# Patient Record
Sex: Female | Born: 1945 | Race: Black or African American | Hispanic: No | Marital: Single | Smoking: Former smoker
Health system: Southern US, Community
[De-identification: ages and names within clinical notes are randomized; demographics above are authoritative.]

## PROBLEM LIST (undated history)

## (undated) DIAGNOSIS — T8859XA Other complications of anesthesia, initial encounter: Secondary | ICD-10-CM

## (undated) DIAGNOSIS — N186 End stage renal disease: Secondary | ICD-10-CM

## (undated) DIAGNOSIS — D259 Leiomyoma of uterus, unspecified: Secondary | ICD-10-CM

## (undated) DIAGNOSIS — N2581 Secondary hyperparathyroidism of renal origin: Secondary | ICD-10-CM

## (undated) DIAGNOSIS — Z992 Dependence on renal dialysis: Secondary | ICD-10-CM

## (undated) DIAGNOSIS — D649 Anemia, unspecified: Secondary | ICD-10-CM

## (undated) DIAGNOSIS — Z5189 Encounter for other specified aftercare: Secondary | ICD-10-CM

## (undated) DIAGNOSIS — G8929 Other chronic pain: Secondary | ICD-10-CM

## (undated) DIAGNOSIS — T4145XA Adverse effect of unspecified anesthetic, initial encounter: Secondary | ICD-10-CM

## (undated) DIAGNOSIS — H269 Unspecified cataract: Secondary | ICD-10-CM

## (undated) DIAGNOSIS — M549 Dorsalgia, unspecified: Secondary | ICD-10-CM

## (undated) DIAGNOSIS — I10 Essential (primary) hypertension: Secondary | ICD-10-CM

## (undated) DIAGNOSIS — Z9989 Dependence on other enabling machines and devices: Secondary | ICD-10-CM

## (undated) DIAGNOSIS — M1990 Unspecified osteoarthritis, unspecified site: Secondary | ICD-10-CM

## (undated) HISTORY — DX: Leiomyoma of uterus, unspecified: D25.9

## (undated) HISTORY — DX: Anemia, unspecified: D64.9

## (undated) HISTORY — PX: HERNIA REPAIR: SHX51

## (undated) HISTORY — DX: Essential (primary) hypertension: I10

## (undated) HISTORY — DX: Other chronic pain: G89.29

## (undated) HISTORY — PX: COLONOSCOPY W/ BIOPSIES: SHX1374

## (undated) HISTORY — DX: Secondary hyperparathyroidism of renal origin: N25.81

## (undated) HISTORY — DX: Dorsalgia, unspecified: M54.9

## (undated) HISTORY — PX: OTHER SURGICAL HISTORY: SHX169

## (undated) HISTORY — DX: Encounter for other specified aftercare: Z51.89

## (undated) HISTORY — DX: Unspecified cataract: H26.9

---

## 1996-02-20 HISTORY — PX: HEMICOLECTOMY: SHX854

## 1997-12-20 ENCOUNTER — Encounter (HOSPITAL_COMMUNITY): Admission: RE | Admit: 1997-12-20 | Discharge: 1998-03-20 | Payer: Self-pay | Admitting: Nephrology

## 1998-03-26 ENCOUNTER — Encounter (HOSPITAL_COMMUNITY): Admission: RE | Admit: 1998-03-26 | Discharge: 1998-06-24 | Payer: Self-pay | Admitting: Nephrology

## 1998-07-05 ENCOUNTER — Encounter: Admission: RE | Admit: 1998-07-05 | Discharge: 1998-10-03 | Payer: Self-pay | Admitting: Nephrology

## 1998-10-04 ENCOUNTER — Encounter (HOSPITAL_COMMUNITY): Admission: RE | Admit: 1998-10-04 | Discharge: 1999-01-02 | Payer: Self-pay | Admitting: Nephrology

## 1998-12-26 ENCOUNTER — Encounter: Admission: RE | Admit: 1998-12-26 | Discharge: 1999-03-26 | Payer: Self-pay | Admitting: Nephrology

## 1999-01-06 ENCOUNTER — Ambulatory Visit (HOSPITAL_COMMUNITY): Admission: RE | Admit: 1999-01-06 | Discharge: 1999-01-06 | Payer: Self-pay | Admitting: Vascular Surgery

## 1999-01-06 ENCOUNTER — Encounter: Payer: Self-pay | Admitting: Vascular Surgery

## 1999-01-16 ENCOUNTER — Encounter (HOSPITAL_COMMUNITY): Admission: RE | Admit: 1999-01-16 | Discharge: 1999-04-16 | Payer: Self-pay | Admitting: Nephrology

## 1999-05-06 DIAGNOSIS — Q612 Polycystic kidney, adult type: Secondary | ICD-10-CM | POA: Insufficient documentation

## 1999-08-08 ENCOUNTER — Encounter: Admission: RE | Admit: 1999-08-08 | Discharge: 1999-08-08 | Payer: Self-pay | Admitting: Nephrology

## 1999-08-08 ENCOUNTER — Encounter: Payer: Self-pay | Admitting: Nephrology

## 1999-08-26 ENCOUNTER — Ambulatory Visit (HOSPITAL_COMMUNITY): Admission: RE | Admit: 1999-08-26 | Discharge: 1999-08-26 | Payer: Self-pay | Admitting: Nephrology

## 1999-08-27 ENCOUNTER — Encounter: Payer: Self-pay | Admitting: Vascular Surgery

## 1999-08-27 ENCOUNTER — Ambulatory Visit (HOSPITAL_COMMUNITY): Admission: RE | Admit: 1999-08-27 | Discharge: 1999-08-27 | Payer: Self-pay | Admitting: Vascular Surgery

## 1999-08-28 ENCOUNTER — Emergency Department (HOSPITAL_COMMUNITY): Admission: EM | Admit: 1999-08-28 | Discharge: 1999-08-28 | Payer: Self-pay | Admitting: Emergency Medicine

## 1999-08-31 ENCOUNTER — Emergency Department (HOSPITAL_COMMUNITY): Admission: EM | Admit: 1999-08-31 | Discharge: 1999-08-31 | Payer: Self-pay | Admitting: Emergency Medicine

## 1999-09-10 ENCOUNTER — Ambulatory Visit (HOSPITAL_COMMUNITY): Admission: RE | Admit: 1999-09-10 | Discharge: 1999-09-10 | Payer: Self-pay | Admitting: Nephrology

## 1999-12-09 ENCOUNTER — Emergency Department (HOSPITAL_COMMUNITY): Admission: EM | Admit: 1999-12-09 | Discharge: 1999-12-09 | Payer: Self-pay | Admitting: Emergency Medicine

## 1999-12-22 ENCOUNTER — Encounter: Admission: RE | Admit: 1999-12-22 | Discharge: 1999-12-22 | Payer: Self-pay | Admitting: *Deleted

## 2000-01-21 ENCOUNTER — Encounter: Payer: Self-pay | Admitting: Nephrology

## 2000-01-21 ENCOUNTER — Inpatient Hospital Stay (HOSPITAL_COMMUNITY): Admission: AD | Admit: 2000-01-21 | Discharge: 2000-01-24 | Payer: Self-pay | Admitting: Nephrology

## 2000-01-23 ENCOUNTER — Encounter: Payer: Self-pay | Admitting: Nephrology

## 2000-03-19 ENCOUNTER — Ambulatory Visit (HOSPITAL_COMMUNITY): Admission: RE | Admit: 2000-03-19 | Discharge: 2000-03-19 | Payer: Self-pay | Admitting: Vascular Surgery

## 2000-03-19 HISTORY — PX: ARTERIOVENOUS GRAFT PLACEMENT: SUR1029

## 2000-05-03 ENCOUNTER — Ambulatory Visit (HOSPITAL_COMMUNITY): Admission: RE | Admit: 2000-05-03 | Discharge: 2000-05-03 | Payer: Self-pay | Admitting: Gastroenterology

## 2000-05-31 ENCOUNTER — Ambulatory Visit (HOSPITAL_COMMUNITY): Admission: RE | Admit: 2000-05-31 | Discharge: 2000-05-31 | Payer: Self-pay | Admitting: Vascular Surgery

## 2000-05-31 HISTORY — PX: REMOVAL OF A DIALYSIS CATHETER: SHX6053

## 2000-06-04 ENCOUNTER — Ambulatory Visit (HOSPITAL_COMMUNITY): Admission: RE | Admit: 2000-06-04 | Discharge: 2000-06-04 | Payer: Self-pay | Admitting: *Deleted

## 2000-06-04 ENCOUNTER — Encounter: Payer: Self-pay | Admitting: *Deleted

## 2000-06-04 HISTORY — PX: THROMBECTOMY AND REVISION OF ARTERIOVENTOUS (AV) GORETEX  GRAFT: SHX6120

## 2000-06-06 ENCOUNTER — Encounter: Payer: Self-pay | Admitting: *Deleted

## 2000-06-06 ENCOUNTER — Ambulatory Visit (HOSPITAL_COMMUNITY): Admission: RE | Admit: 2000-06-06 | Discharge: 2000-06-06 | Payer: Self-pay | Admitting: *Deleted

## 2000-06-06 HISTORY — PX: INSERTION OF DIALYSIS CATHETER: SHX1324

## 2000-06-09 ENCOUNTER — Ambulatory Visit (HOSPITAL_COMMUNITY): Admission: RE | Admit: 2000-06-09 | Discharge: 2000-06-09 | Payer: Self-pay | Admitting: Nephrology

## 2000-06-09 ENCOUNTER — Encounter: Payer: Self-pay | Admitting: Nephrology

## 2000-06-14 ENCOUNTER — Ambulatory Visit (HOSPITAL_COMMUNITY): Admission: RE | Admit: 2000-06-14 | Discharge: 2000-06-14 | Payer: Self-pay | Admitting: Nephrology

## 2000-06-25 ENCOUNTER — Ambulatory Visit (HOSPITAL_COMMUNITY): Admission: RE | Admit: 2000-06-25 | Discharge: 2000-06-25 | Payer: Self-pay | Admitting: Nephrology

## 2000-06-28 ENCOUNTER — Ambulatory Visit (HOSPITAL_COMMUNITY): Admission: RE | Admit: 2000-06-28 | Discharge: 2000-06-28 | Payer: Self-pay | Admitting: Vascular Surgery

## 2000-06-28 ENCOUNTER — Encounter: Payer: Self-pay | Admitting: Vascular Surgery

## 2000-06-28 HISTORY — PX: INSERTION OF DIALYSIS CATHETER: SHX1324

## 2000-08-13 ENCOUNTER — Encounter: Payer: Self-pay | Admitting: Vascular Surgery

## 2000-08-13 ENCOUNTER — Ambulatory Visit (HOSPITAL_COMMUNITY): Admission: RE | Admit: 2000-08-13 | Discharge: 2000-08-13 | Payer: Self-pay | Admitting: Vascular Surgery

## 2000-08-13 HISTORY — PX: INSERTION OF DIALYSIS CATHETER: SHX1324

## 2000-08-23 ENCOUNTER — Emergency Department (HOSPITAL_COMMUNITY): Admission: EM | Admit: 2000-08-23 | Discharge: 2000-08-23 | Payer: Self-pay | Admitting: Emergency Medicine

## 2000-09-01 ENCOUNTER — Ambulatory Visit (HOSPITAL_COMMUNITY): Admission: RE | Admit: 2000-09-01 | Discharge: 2000-09-01 | Payer: Self-pay | Admitting: Nephrology

## 2000-09-08 ENCOUNTER — Encounter: Payer: Self-pay | Admitting: Nephrology

## 2000-09-08 ENCOUNTER — Ambulatory Visit (HOSPITAL_COMMUNITY): Admission: RE | Admit: 2000-09-08 | Discharge: 2000-09-08 | Payer: Self-pay | Admitting: Nephrology

## 2000-09-22 ENCOUNTER — Ambulatory Visit (HOSPITAL_COMMUNITY): Admission: RE | Admit: 2000-09-22 | Discharge: 2000-09-22 | Payer: Self-pay | Admitting: Vascular Surgery

## 2000-09-22 ENCOUNTER — Encounter: Payer: Self-pay | Admitting: Vascular Surgery

## 2000-11-03 ENCOUNTER — Ambulatory Visit (HOSPITAL_COMMUNITY): Admission: RE | Admit: 2000-11-03 | Discharge: 2000-11-03 | Payer: Self-pay | Admitting: Nephrology

## 2000-12-01 ENCOUNTER — Encounter: Payer: Self-pay | Admitting: Vascular Surgery

## 2000-12-03 ENCOUNTER — Inpatient Hospital Stay (HOSPITAL_COMMUNITY): Admission: RE | Admit: 2000-12-03 | Discharge: 2000-12-08 | Payer: Self-pay | Admitting: Vascular Surgery

## 2000-12-03 HISTORY — PX: ARTERIOVENOUS GRAFT PLACEMENT: SUR1029

## 2000-12-13 ENCOUNTER — Ambulatory Visit (HOSPITAL_COMMUNITY): Admission: RE | Admit: 2000-12-13 | Discharge: 2000-12-13 | Payer: Self-pay | Admitting: *Deleted

## 2000-12-23 ENCOUNTER — Ambulatory Visit (HOSPITAL_COMMUNITY): Admission: RE | Admit: 2000-12-23 | Discharge: 2000-12-23 | Payer: Self-pay | Admitting: Nephrology

## 2001-02-12 ENCOUNTER — Emergency Department (HOSPITAL_COMMUNITY): Admission: EM | Admit: 2001-02-12 | Discharge: 2001-02-12 | Payer: Self-pay

## 2001-03-22 ENCOUNTER — Emergency Department (HOSPITAL_COMMUNITY): Admission: EM | Admit: 2001-03-22 | Discharge: 2001-03-22 | Payer: Self-pay

## 2002-02-08 ENCOUNTER — Ambulatory Visit (HOSPITAL_COMMUNITY): Admission: RE | Admit: 2002-02-08 | Discharge: 2002-02-08 | Payer: Self-pay | Admitting: Nephrology

## 2002-02-08 ENCOUNTER — Encounter: Payer: Self-pay | Admitting: Nephrology

## 2003-05-31 ENCOUNTER — Encounter: Payer: Self-pay | Admitting: Nephrology

## 2003-05-31 ENCOUNTER — Ambulatory Visit (HOSPITAL_COMMUNITY): Admission: RE | Admit: 2003-05-31 | Discharge: 2003-05-31 | Payer: Self-pay | Admitting: Nephrology

## 2003-08-08 ENCOUNTER — Encounter: Admission: RE | Admit: 2003-08-08 | Discharge: 2003-08-08 | Payer: Self-pay | Admitting: Nephrology

## 2003-08-14 ENCOUNTER — Other Ambulatory Visit: Admission: RE | Admit: 2003-08-14 | Discharge: 2003-08-14 | Payer: Self-pay | Admitting: Obstetrics and Gynecology

## 2003-12-17 ENCOUNTER — Encounter: Admission: RE | Admit: 2003-12-17 | Discharge: 2003-12-17 | Payer: Self-pay | Admitting: Nephrology

## 2003-12-21 ENCOUNTER — Encounter: Admission: RE | Admit: 2003-12-21 | Discharge: 2003-12-21 | Payer: Self-pay | Admitting: Nephrology

## 2004-01-24 ENCOUNTER — Ambulatory Visit (HOSPITAL_COMMUNITY): Admission: RE | Admit: 2004-01-24 | Discharge: 2004-01-24 | Payer: Self-pay | Admitting: Nephrology

## 2004-03-20 ENCOUNTER — Emergency Department (HOSPITAL_COMMUNITY): Admission: EM | Admit: 2004-03-20 | Discharge: 2004-03-20 | Payer: Self-pay | Admitting: Emergency Medicine

## 2004-03-22 ENCOUNTER — Emergency Department (HOSPITAL_COMMUNITY): Admission: EM | Admit: 2004-03-22 | Discharge: 2004-03-22 | Payer: Self-pay | Admitting: Emergency Medicine

## 2004-04-15 ENCOUNTER — Encounter: Admission: RE | Admit: 2004-04-15 | Discharge: 2004-04-15 | Payer: Self-pay | Admitting: Nephrology

## 2004-05-05 ENCOUNTER — Ambulatory Visit (HOSPITAL_COMMUNITY): Admission: RE | Admit: 2004-05-05 | Discharge: 2004-05-05 | Payer: Self-pay | Admitting: Gastroenterology

## 2004-05-13 ENCOUNTER — Ambulatory Visit (HOSPITAL_COMMUNITY): Admission: RE | Admit: 2004-05-13 | Discharge: 2004-05-13 | Payer: Self-pay | Admitting: Nephrology

## 2004-08-29 ENCOUNTER — Ambulatory Visit (HOSPITAL_COMMUNITY): Admission: RE | Admit: 2004-08-29 | Discharge: 2004-08-29 | Payer: Self-pay | Admitting: Nephrology

## 2004-09-06 ENCOUNTER — Ambulatory Visit (HOSPITAL_COMMUNITY): Admission: RE | Admit: 2004-09-06 | Discharge: 2004-09-06 | Payer: Self-pay | Admitting: *Deleted

## 2004-09-06 HISTORY — PX: THROMBECTOMY AND REVISION OF ARTERIOVENTOUS (AV) GORETEX  GRAFT: SHX6120

## 2004-10-15 ENCOUNTER — Ambulatory Visit: Payer: Self-pay | Admitting: Cardiology

## 2004-10-27 ENCOUNTER — Ambulatory Visit: Payer: Self-pay

## 2004-11-03 ENCOUNTER — Ambulatory Visit: Payer: Self-pay | Admitting: Cardiology

## 2006-03-26 ENCOUNTER — Ambulatory Visit (HOSPITAL_COMMUNITY): Admission: RE | Admit: 2006-03-26 | Discharge: 2006-03-30 | Payer: Self-pay

## 2006-04-15 HISTORY — PX: INCISIONAL HERNIA REPAIR: SHX193

## 2006-12-13 ENCOUNTER — Encounter: Admission: RE | Admit: 2006-12-13 | Discharge: 2006-12-13 | Payer: Self-pay | Admitting: Nephrology

## 2007-02-01 ENCOUNTER — Emergency Department (HOSPITAL_COMMUNITY): Admission: EM | Admit: 2007-02-01 | Discharge: 2007-02-01 | Payer: Self-pay | Admitting: Emergency Medicine

## 2010-01-20 DIAGNOSIS — D259 Leiomyoma of uterus, unspecified: Secondary | ICD-10-CM | POA: Insufficient documentation

## 2010-01-20 DIAGNOSIS — M47817 Spondylosis without myelopathy or radiculopathy, lumbosacral region: Secondary | ICD-10-CM | POA: Insufficient documentation

## 2010-05-16 ENCOUNTER — Encounter: Admission: RE | Admit: 2010-05-16 | Discharge: 2010-05-16 | Payer: Self-pay | Admitting: Family Medicine

## 2010-06-13 ENCOUNTER — Encounter: Admission: RE | Admit: 2010-06-13 | Discharge: 2010-06-13 | Payer: Self-pay | Admitting: Nephrology

## 2010-10-11 ENCOUNTER — Encounter: Payer: Self-pay | Admitting: Nephrology

## 2010-10-12 ENCOUNTER — Encounter: Payer: Self-pay | Admitting: Family Medicine

## 2011-01-08 ENCOUNTER — Ambulatory Visit (HOSPITAL_COMMUNITY)
Admission: RE | Admit: 2011-01-08 | Discharge: 2011-01-08 | Disposition: A | Discharge status: Home / Self Care | Payer: Medicare Other | Source: Ambulatory Visit | Attending: Nephrology | Admitting: Nephrology

## 2011-01-08 DIAGNOSIS — I080 Rheumatic disorders of both mitral and aortic valves: Secondary | ICD-10-CM | POA: Insufficient documentation

## 2011-01-08 DIAGNOSIS — I079 Rheumatic tricuspid valve disease, unspecified: Secondary | ICD-10-CM | POA: Insufficient documentation

## 2011-01-08 DIAGNOSIS — I319 Disease of pericardium, unspecified: Secondary | ICD-10-CM | POA: Insufficient documentation

## 2011-01-08 DIAGNOSIS — I9589 Other hypotension: Secondary | ICD-10-CM | POA: Insufficient documentation

## 2011-01-08 DIAGNOSIS — N186 End stage renal disease: Secondary | ICD-10-CM | POA: Insufficient documentation

## 2011-01-08 DIAGNOSIS — Z992 Dependence on renal dialysis: Secondary | ICD-10-CM | POA: Insufficient documentation

## 2011-01-29 ENCOUNTER — Encounter (INDEPENDENT_AMBULATORY_CARE_PROVIDER_SITE_OTHER): Payer: Medicare Other

## 2011-01-29 DIAGNOSIS — N186 End stage renal disease: Secondary | ICD-10-CM

## 2011-01-29 DIAGNOSIS — Z48812 Encounter for surgical aftercare following surgery on the circulatory system: Secondary | ICD-10-CM

## 2011-02-06 NOTE — Procedures (Signed)
[place]. [facility]  Patient:    Sue Neal, Sue Neal                        MRN: [id number] Proc. Date: 05/03/00 Adm. Date:  [id number] Attending:  Ernie Avena CC:         Joyice Faster. Deterding, M.D.   Procedure Report  PROCEDURE:  Colonoscopy with biopsy.  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  FINDINGS:  Diminutive sessile polyp on a fold at 50 cm, otherwise normal examination to the cecum.  DESCRIPTION OF PROCEDURE:  The nature, purpose, and risks of the procedure had been discussed with the patient who provided written consent.  This procedure was performed immediately following her upper endoscopy, with total sedation for the two procedures being fentanyl 50 mcg and Versed 6 mg IV without arrhythmias or any significant desaturation.  The Olympus pediatric colonoscope, PCF140L, was advanced to the cecum as identified by the visualization of the appendiceal orifice and the absence of further lumen.  I believe I also was seeing the crevice of the ileocecal valve, although I did not enter the terminal ileum.  Transillumination could not be accomplished at this point.  Pullback was then performed.  The quality of the prep was excellent, and it is felt that all areas were well seen.  There was a tiny 2 to 3 mm sessile polyp on a fold at 50 cm removed by a single cold biopsy.  No other polyps were observed, and there was no evidence of cancer, colitis, vascular malformations, or diverticulosis.  There was some slight friability in the proximal ascending colon.  Retroflexion could not be accomplished in the rectum due to a small rectal ampulla, but careful antegrade viewing disclosed no additional lesions.  The patient tolerated the procedure well, and there were no apparent complications.  IMPRESSION:  Diminutive sessile polyp, otherwise normal exam  to the cecum. The site of the transverse colonic adenoma resection is not endoscopically evident.  PLAN:  Consider follow up colonoscopy in 5 years in view of the prior history of significant adenoma having been removed. DD:  05/03/00 TD:  05/03/00 Job: [id number]

## 2011-02-06 NOTE — Op Note (Signed)
[facility]  Patient:    Sue Neal, Sue Neal                        MRN: [id number] Proc. Date: 06/06/00 Adm. Date:  [id number] Attending:  Lavonna Monarch                           Operative Report  PREOPERATIVE DIAGNOSES: 1. End-stage renal failure. 2. Recurrent occlusion, right upper arm arteriovenous graft.  POSTOPERATIVE DIAGNOSES: 1. End-stage renal failure. 2. Recurrent occlusion, right upper arm arteriovenous graft.  OPERATION:  Insertion of left internal jugular Quinton catheter.  SURGEON:  Gordy Clement, M.D.  ASSISTANT:  Nurse.  ANESTHESIA:  Local with MAC.  CLINICAL NOTE:  This is a 65 year old obese black female with a history of end-stage renal failure.  On chronic hemodialysis.  She recently underwent thrombectomy and revision of a right upper arm AV Gore-Tex graft; however, the out flow vein was severely diseased and the graft occluded once again.  The patient is brought to the operating room at this time for insertion of dialysis access via a catheter.  DESCRIPTION OF PROCEDURE:  The patient was brought to the [facility] Operating Room in stable condition.  Informed consent obtained.  The neck and chest were prepped and draped in the sterile fashion.  Initial attempt was made to insert the catheter in the right internal jugular vein.  The vein could be accessed with a needle, a guide wire, however, could not be advanced.  Injection of contrast revealed quite narrowed vein draining the right neck.  Attention then placed on the left neck.  Skin and subcutaneous tissue was instilled with 1% xylocaine.  Needle was easily introduced into the left internal jugular vein.  A guide wire passed through the needle into the superior vena cava.  There was marked angulation of the superior vena cava and the guide wire was passed with some difficulty into the heart.  Due to the marked angulation, an angulated catheter was advanced  over the guide wire.  The guide was then removed and a super stiff guide wire advanced over the catheter into the right atrium.  Subcutaneous tunnel created.  A Quinton catheter placed through the tunnel. The guide wire tract dilated up under fluoroscopic control to 16 [place].  The Quinton catheter placed over the guide wire and positioned at the junction of the left innominate vein and superior vena cava.  The guide wire was removed. Catheter flushed with heparin and saline solution.  Capped with heparin.  The insertion site was closed with interrupted 3-0 nylon suture.  The catheter fixed to the skin with interrupted 2-0 silk suture.  Insertion of this catheter was quite difficulty secondary to the marked angulation of the superior vena cava.  Prior to further access, central venogram will be required.  The patient tolerated the procedure well with no apparent complication despite the technically difficult aspects.  Chest x-ray ordered before the recovery room. DD:  06/06/00 TD:  06/08/00 Job: [id number]

## 2011-02-06 NOTE — Op Note (Signed)
Sue Neal, Sue Neal                 ACCOUNT NO.:  [id number]   MEDICAL RECORD NO.:  [id number]          PATIENT TYPE:  AMB   LOCATION:  SDS                          FACILITY:  [place]   PHYSICIAN:  Georgina Quint, M.D.   DATE OF BIRTH:  June 06, 1946   DATE OF PROCEDURE:  03/26/2006  DATE OF DISCHARGE:                                 OPERATIVE REPORT   PREOPERATIVE DIAGNOSIS:  Incisional hernia.   POSTOPERATIVE DIAGNOSIS:  Incisional hernia.   OPERATION:  Laparoscopic repair of incisional hernia.   SURGEON:  Dr. Georgina Quint.   ASSISTANT:  Dr. Kathrin Penner.   ANESTHESIA:  General and local.   BLOOD LOSS:  Minimal.   COMPLICATIONS:  None.   CONDITION:  To PACU good.   PROCEDURE:  After the patient was monitored and asleep and had routine  preparation and draping of the abdomen, I made a short transverse incision  about 3 cm in length in the lateral mid abdomen through a site anesthetized  with local anesthetic.  I dissected down through the fat to the external  oblique and cut it in the direction of its fibers and then spread the other  muscles and pulled up the peritoneum and very carefully entered the abdomen  shortly noting that there were no injuries on entry.  I put in the zero  Vicryl pursestring suture and secured a Hassan cannula and put in the scope  and saw that there were rather extensive adhesions limited to the midline  area.  I then put in two more ports in the right lower quadrant both 5 mm in  size, both placed under direct view coming in atraumatically.  I then took  down the adhesions utilizing the scissors and cautery being very careful to  avoid injury to the small bowel.  I noted a complicated hernia defect in the  midline and there was a good bit of old redundant ineffective blue suture  present.  After getting all of the adhesions down and checking to see that  there was no injury of viscera and no bleeding, I marked the extent of the  hernia by  palpation on the abdominal wall and found it to be approximately  20 x 10 cm.  I selected a piece Parietex 25 cm x 20 cm and marked it with  orientation so that I could use it by bringing it up to the abdominal wall  inside.  I placed eight sutures of permanent type zero Prolene or Novofil  suture and secured them and wet he mesh and rolled it and put it into the  abdomen and unfurled it so as to lay in the orientation which I had  selected.  I then used a suture retriever through tiny incisions to pull the  Prolene sutures up and tied them down and that snugged the mesh pretty well  up to the abdominal wall.  Using a pro-tacker, I further secured the mesh to  the abdominal wall.  I put in two ports on the left lateral side so as to be  able to see the  lateral part of the mesh better on the right side.  I felt  this generously covered all of the hernia defects and there was minimal  redundancy.  I again checked the viscera and saw no evidence of injury.  Sponge, needle and instrument  counts were correct.  After allowing the carbon dioxide to escape and  removing the ports, I tied the pursestring suture and then reinforced the  first incision a little bit and then closed all the skin incisions with  intracuticular 4-0 Vicryl and Steri-Strips.  The patient tolerated the  operation well.      Georgina Quint, M.D.  Electronically Signed     WB/MEDQ  D:  03/26/2006  T:  03/26/2006  Job:  [id number]   cc:   Sherril Croon, M.D.  Fax: [phone]

## 2011-02-06 NOTE — Discharge Summary (Signed)
[place]. [facility]  Patient:    MELISSE, FLECKER                        MRN: [id number] Adm. Date:  [id number] Disc. Date: [id number] Attending:  Lance Sell Dictator:   Bertha Stakes, P.A. CC:         [place] [facility]                           Discharge Summary  DISCHARGE DIAGNOSES: 1. Recurrent Serratia sepsis, as well as a positive yeast blood culture. 2. Status post removal of right internal jugular Ash catheter secondary to    number one, and subsequent placement of right internal jugular Schon    catheter. 3. End stage renal disease secondary to polycystic kidney disease. 4. Abnormal gastric antrum folds noted on CT scan of the pelvis to follow up    on a soft tissue mass noted on abdominal ultrasound, CT revealed no mass or    abscess, however, the uterus appeared mildly heterogenous and suggested the    possibility of fibroids.  HISTORY OF PRESENT ILLNESS:  Ms. Musch is a 65 year old African-American female with end-stage renal disease secondary to polycystic kidney disease who started on hemodialysis in August 2000, who is admitted for recurrent serratia bacteremia as well of 1/2 blood cultures positive for yeast.  Her initial serratia sepsis was noted on December 23, 1999, was resistant to Ancef, and she received a three week course of antibiotics, including one week of tobramycin and two weeks of Cipro.  On follow up repeat cultures done on April 26, results were positive for serratia as well as yeast.  She was afebrile at the kidney center, but she was started on Diflucan and tobramycin with plans to admit her to the hospital for removal of her catheter and for her serratia sepsis.  In addition, Ms. Caruthers had been complaining of abdominal pain and difficulty swallowing prior to admission, and a KUB and abdominal ultrasound were obtained in early April showing a soft tissue mass in the lower abdomen/pelvis, as  well as abnormal gastric antrum folds.  PHYSICAL EXAMINATION:  VITAL SIGNS:  Blood pressure 115/56.  ABDOMEN:  Positive bowel sounds, soft, nontender, nondistended.  The rest of physical examination findings can be found in the admission history and physical.  LABORATORY DATA:  Admission labs revealed a white blood cell count of 7.4, hemoglobin 9.8, hematocrit 31.9, platelet count of 162,000.  Sodium 139, potassium 5.0, chloride 104, CO2 25, glucose 86, BUN 42, creatinine 12.2, calcium 8.3, albumin of 2.7.  Liver function tests were within normal limits. Phosphorus was 7.0.  She was admitted for removal of her hemodialysis catheter and intravenous antibiotics for her serratia sepsis.  HOSPITAL COURSE: #1 - RECURRENT SERRATIA ______  BACTEREMIA AND 1/2 BLOOD CULTURES POSITIVE FOR YEAST:  On admission the patient was continued on Diflucan and ciprofloxacin.  Blood cultures were obtained which revealed no growth.  Her catheter was removed on Jan 21, 2000, and catheter tips were sent for culture. These grew Candida tropicalis as well as stenotrophmonas maltophilia.  A right IJ Schon catheter was placed on Jan 23, 2000, by Dr. Jeneen Rinks Deterding without complications.  Post catheter chest x-ray revealed no pneumothorax.  She dialyzed the following catheter placement with 400 blood flow rates with ______ approximately 1 L of fluid.  She again dialyzed on the  day of discharge, Jan 24, 2000, with 400 blood flow rates and 1.3 L of fluid removed. She will continue on Diflucan and ciprofloxacin as an outpatient, and blood cultures will need to be done two weeks following completion of antibiotics to follow up on her sepsis.  #2 - ABNORMAL GASTRIC ANTRUM FOLDS:  The patient was noted to have abnormal gastric antrum folds as an outpatient.  She has been set up to have an upper endoscopy by Dr. Cristina Gong on May 8.  Dr. Cristina Gong saw her during her hospitalization and the decision was made to postpone  endoscopy until she was discharged from the hospital.  #3 - PELVIC SOFT TISSUE MASS:  Ms. Gendreau had an abdominal ultrasound in April 2001 as an outpatient revealing an abdominal soft tissue mass.  An CT of the abdomen was obtained during her hospitalization to follow up on mass.  It revealed no mass, free pelvic fluid, or evidence of abscess.  It did show the possibility of fibroids, however.  She states that she has been having irregular periods, the last being several months ago.  This will be followed up as an outpatient.  DISCHARGE MEDICATIONS: 1. Nephro-Vite one p.o. q.d. 2. Calcium 500 mg three tablets t.i.d. with meals. 3. Quinine sulfate 325 mg one p.o. q.12h. p.r.n. cramping. 4. Prilosec 20 mg one p.o. q.h.s. 5. Ciprofloxacin 500 mg one p.o. q.d. through Feb 11, 2000. 6. Diflucan 100 mg one p.o. q.d. through 02/11/00. 7. Epogen 10,000 units intravenously every hemodialysis.  DISCHARGE INSTRUCTIONS: 1. Ms. Halberstadt is to follow a 70 g protein, 2 g sodium, 2 g potassium, kidney    failure diet with 5 cup fluid restriction per day. 2. Pursestring sutures from her IJ Schon catheter should be removed in one    week.  Neck sutures should be removed at the kidney center in two weeks. 3. She is to follow her normal outpatient hemodialysis schedule on Tuesdays,    Thursdays, and Saturdays at [facility] on [address]. 4. Her endoscopy with Dr. Cristina Gong is scheduled for Jan 27, 2000.  CONDITION ON DISCHARGE:  Stable.  DISPOSITION:  Discharged to home. DD:  02/18/00 TD:  02/18/00 Job: [id number]

## 2011-02-06 NOTE — Discharge Summary (Signed)
[place]. [facility]  Patient:    Sue Neal, Sue Neal                        MRN: [id number] Adm. Date:  [id number] Disc. Date: [id number] Attending:  Dorothea Glassman Dictator:   Sheliah Hatch, P.A. CC:         Joyice Faster Deterding, M.D.  CVTS Office   Discharge Summary  DATE OF BIRTH:  24-Dec-1945   ADMISSION DIAGNOSIS:  End-stage renal disease with failed upper extremity grafts.  DISCHARGE DIAGNOSES: 1. End-stage renal disease with failed upper extremity grafts. 2. Hypotension requiring additional hospitalization.  PAST MEDICAL HISTORY: 1. End-stage renal disease with hemodialysis. 2. Hypertension. 3. Chronic anemia. 4. Secondary hyperparathyroidism. 5. Hypocalcemia. 6. Chronic back pain. 7. 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  PROCEDURE:  Placement of new left thigh arteriovenous Gore-Tex graft on December 03, 2000.  HISTORY OF PRESENT ILLNESS:  Sue Neal is a 65 year old female with end-stage renal disease and multiple failed grafts and revisions in her upper extremities.  Dr. Doren Custard assessed Sue Neal and concluded that there were no further opportunities for grafts in her upper extremities and her only other option was lower extremity Gore-Tex graft.  This was recommended and scheduled for January 2002.  Sue Neal.  In the meantime, she was using her right IJ Ash catheter for dialysis which was placed on June 28, 2000.  HOSPITAL COURSE:  Sue Neal was admitted for the elective procedure as scheduled on December 03, 2000.  There were no major complications and she remained stable.  However, her systolic blood pressure was low during the case requiring Neo-Synephrine.  She was admitted to 3300 postop to follow her pressure.  Postop, [name] required pressure support and normal saline boluses for several days to maintain adequate blood pressure.  She did  remain stable, however.  Her graft remained patent with a good thrill.  Her wound was healing well.  She was weaned off pressors and her blood pressure improved. She was transferred back to 5500.  A 2D echocardiogram was done which showed good left ventricular function and ejection fraction of 65-75%.  By December 07, 2000, postop day #4, she was doing very well.  She continued to do well.  On postop day #5, she had no complaints.  She was afebrile and vital signs were stable.  Her blood pressure was 114/37.  Her incisions were healing well.  She had a good thrill in her graft as she was deemed suitable and stable for discharge home by both CVTS and the renal services and was subsequently discharged.  DISCHARGE MEDICATIONS: 1. She was told to resume her previous medications. 2. Tylox one to two every four to six hours as needed for severe pain.  SPECIAL INSTRUCTIONS:  She was told to watch her wounds for increasing redness, swelling, drainage or fever and to clean daily with soap and water. She was also told the graft was not to be used for three weeks.  She was given arrangements for home health to change her dressing daily and to clean her wound daily with Betadine for one week after discharge.  FOLLOWUP:  Follow with dialysis as scheduled.  CONDITION ON DISCHARGE:  Stable. DD:  12/16/00 TD:  12/18/00 Job: [id number]

## 2011-02-06 NOTE — Op Note (Signed)
[facility]  Patient:    Sue Neal, Sue Neal                        MRN: [id number] Proc. Date: 06/28/00 Adm. Date:  [id number] Attending:  Dorothea Glassman                           Operative Report  PREOPERATIVE DIAGNOSES:  Chronic renal failure.  POSTOPERATIVE DIAGNOSES:  Chronic renal failure.  PROCEDURE:  Placement of right IJ Ash catheter.  SURGEON:  Dr. Scot Dock.  ASSISTANT:  Nurse.  ANESTHESIA:  Local with sedation.  TECHNIQUE:  The patient was taken to the operating room and sedated by anesthesia. The right and left neck and upper chest were prepped and draped in the usual sterile fashion. The patient was placed in Trendelenburg. After the skin was anesthetized with 1% lidocaine, the right internal jugular vein was cannulated; however, I had difficulty in passing the wire. I shot a venogram and it looked like I was potentially in a branch and not in the internal jugular vein. Therefore I recannulated the internal jugular vein and it was able to pass the wire into the right atrium. Next, the exit site for the catheter was selected and the skin anesthetized between the 2 areas and a 24 cm catheter passed between the 2 incisions. The tract over the wire was then dilated with a 12 [place] and a 14 [place] dilator and then the dilator and peel-away sheath passed over the wire and the wire and dilator removed. The catheter was passed through the peel-away sheath and positioned in the superior vena cava. It looked like one of the lumens was curling in the superior vena cava. I tried to manipulate it by passing the wires through the distal port and passing the catheter further down. The shorter port was still curled, therefore, I passed the wire down this end and was able to straighten out the catheter. I was able to successfully get both ports straight within the superior vena cava just above the right atrium. Both ports withdrew easily and were  then flushed with heparinized saline and filled with concentrated heparin. The catheter was secured at its exit site with a 3-0 nylon suture. The IJ cannulation site was closed with a 4-0 subcuticular stitch. A sterile dressing was applied. The patient tolerated the procedure well and was transferred to the recovery room in satisfactory condition. All needle and sponge counts were correct. DD:  06/28/00 TD:  06/28/00 Job: [id number]

## 2011-02-06 NOTE — Op Note (Signed)
Sue Neal, Sue Neal                 ACCOUNT NO.:  [id number]   MEDICAL RECORD NO.:  [id number]          PATIENT TYPE:  OIB   LOCATION:  [id number]                         FACILITY:  [facility]   PHYSICIAN:  Dorothea Glassman, M.D.    DATE OF BIRTH:  [birth year]   DATE OF PROCEDURE:  09/06/2004  DATE OF DISCHARGE:                                 OPERATIVE REPORT   SURGEON:  Dorothea Glassman, M.D.   ASSISTANT:  Nurse.   ANESTHETIC:  General endotracheal.   ANESTHESIOLOGIST:  Glynda Jaeger, M.D.   PREOPERATIVE DIAGNOSES:  1.  End-stage renal failure.  2.  Clotted left thigh arteriovenous graft.   POSTOPERATIVE DIAGNOSES:  1.  End-stage renal failure.  2.  Clotted left thigh arteriovenous graft.   PROCEDURES:  1.  Thrombectomy of left thigh arteriovenous graft.  2.  Revision of venous limb with interposition Gore-Tex graft.   DESCRIPTION OF PROCEDURE:  The patient was brought to the operating room in  stable condition.  Placed in the supine position.  General endotracheal  anesthesia induced.  Left leg prepped and draped in a sterile fashion.   Oblique skin incision made through the skin of the left groin.  Dissection  carried down through the subcutaneous tissues with electrocautery.  The  venous limb of the graft was identified.  This was followed down through and  end-to-side anastomosis to the saphenous vein.  The saphenous vein was  severely disease with pseudointima up to the saphenofemoral junction.  The  venous anastomosis was taken down.  A short segment of venous limb of the  graft was excised.  The saphenous vein was ligated proximally and distally.   The graft then thrombectomized several times with the 5 Fogarty.  Excellent  inflow was obtained.  The graft was filled with heparin/saline solution and  controlled with the fistula clamp.   A new venous limb was then created.  A 6 mm Gore-Tex graft was anastomosed  end to end to the divided graft using running 6-0 Prolene suture.   This was  then brought down to the common femoral vein.  The common femoral vein was  mobilized and controlled with a partial occlusion clamp.  An anterior wall  venotomy was made.  The graft was beveled and anastomosed end to side with  the common femoral vein using running 6-0 Prolene suture.  Clamps were then  removed.  Excellent flow present.  Adequate hemostasis obtained.  Sponge and  instrument counts correct.   The wound was irrigated with saline solution.  The groin was then closed  with a deep layer of interrupted 2-0 Vicryl suture and the subcutaneous  layer with running 2-0 Vicryl suture.  Skin closed with staples.  Sterile  dressings applied.  The patient tolerated the procedure well and was  transferred to the recovery room in stable condition.       PGH/MEDQ  D:  09/06/2004  T:  09/07/2004  Job:  [id number]

## 2011-02-06 NOTE — Procedures (Signed)
[facility]  Patient:    Sue Neal, Sue Neal                        MRN: [id number] Adm. Date:  [id number] Disc. Date: [id number] Attending:  Lance Sell CC:         [facility]                           Procedure Report  PROCEDURE:  Daiva Huge twin catheter insertion.  INDICATIONS:  Access for hemodialysis.  DESCRIPTION OF PROCEDURE:  The procedure was explained to the patient.  Both benefits and risks understood and accepted.  The patient was taken to the fluoroscopy suite and placed on the fluoroscopy table in the supine position with a towel roll between her shoulders.  Initially we attempted to locate the internal jugular vein.  It was found to be just vestibular, so the right internal jugular vein was located in the mid to one-third of the sternocleidomastoid triangle with the ______ ultrasound device.  Subsequently the right-sided neck and right upper chest were prepped with Betadine.  Two percent Betadine local anesthesia was used to numb up over the sternocleidomastoid triangle in an inferolateral fashion approximately 8 x 4 cm.  Using the ______, an 18-gauge thin-walled needle was inserted in the internal jugular vein.  The J-tipped guide wire was passed through the 18-gauge thin-walled needle.  An stenosis was encountered at the junction of the right subclavian and internal jugular and was able to be bypassed with the J-tipped guide wire with manipulation.  Subsequently another stenosis was encountered in the mid [facility] and it was able to be bypassed also.  The guide wire was placed to the right atrium and the 18-gauge thin-walled needle was withdrawn.  The opening where the guide wire went into skin was dilated with sharp dissection approximately 1 cm.  Serial dilators starting with 10 [place] and then going to 60 and 14 [place].  Subsequently an 29 [place] guide wire with tear-away sheath was placed over the guide wire and advanced into  the internal jugular vein to the superior vena cava under fluoroscopic guidance.  A dialysis clamp was placed around the trocar and sheath just outside the skin. Both lumens of the double-lumen 16 cm Schon catheter were flushed with saline and a screw-in tunneling device was placed in the distal ends.  The trocar was then withdrawn from the sheath.  The sheath was clamped with a dialysis.  both lumens of the double-lumen Schon catheter were brought through the distal end of the sheath with the venous lumen being lateral.  The sheath was then clamped and the catheter was advanced the remainder of the sheath into the internal jugular vein and superior vena cava under fluoroscopic guidance.  The tip came to rest in the superior vena cava/right atrial junction.  Initially there was a kink and that was pulled out and resolved.  The tear-away sheath was removed.  The guide wire was subsequently removed also and there were no kinks.  The tunneling devices were bent in a 180-degree arc to facilitate tunnel formation.  Two parallel curvilinear tunnels were performed essentially in the superior direction, then lateral, and then inferior with the tips coming out 3 cm inferior to the clavicle, 2 cm apart, and approximately 8 cm inferolateral at the vein entry.  The catheter was  clamped, cut off, and connecting device applied.  The catheter  was flushed with saline and then concentrated heparin and heparin locked.  The skin over the vein entry was closed with 3-0 silk and pursestring sutures taken 1 cm from the exit site to the two tunnels with 3-0 silk also.  Op-Site dressing applied. DD:  01/23/00 TD:  01/26/00 Job: [id number]

## 2011-02-06 NOTE — Op Note (Signed)
[place]  Patient:    Sue Neal, Sue Neal                        MRN: [id number] Proc. Date: 06/04/00 Adm. Date:  [id number] Attending:  Lavonna Monarch                           Operative Report  ASSISTANT:  Nurse.  ANESTHETIC:  Local with MAC.  PREOPERATIVE DIAGNOSES:  1. End-stage renal failure.  2. Clotted right upper arm arteriovenous graft.  POSTOPERATIVE DIAGNOSES:  1. End-stage renal failure.  2. Clotted right upper arm arteriovenous graft.  OPERATION PERFORMED:  Thrombectomy and revision of right upper arm arteriovenous graft.  OPERATIVE PROCEDURE:  The patient was brought to the operating room in stable condition and placed in the supine position.  The right upper arm was prepped and draped in a sterile fashion.  The skin and subcutaneous tissues were infiltrated with 1% Xylocaine with epinephrine.  A longitudinal skin incision was made through the skin of the right axilla.  Sharp dissection was carried down to expose the venous anastomosis.  The axillary vein mobilized proximally and controlled with a Gregory clamp.  The venous anastomosis was taken down, the graft divided transversely.  The graft thrombectomized with 5 Fogarty catheter with excellent inflow obtained.  The graft was filled with heparin saline solution.  The was administered 2,000 units of heparin intravenously.  A new segment of 6 mm Gore-Tex was then anastomosed end-to-end to the divided graft using 6-0 Prolene suture.  The graft then beveled and anastomosed end-to-end the basilic vein with running 6-0 Prolene sutures. Clamps were then removed.  Excellent flow present.  Adequate hemostasis obtained.  Sponge and instrument counts were correct.  The patient tolerated the procedure well.  The subcutaneous tissue was closed with two layers of 3-0 running Vicryl sutures, skin closed with 4-0 Monocryl and 1/2 inch Steri-Strips were applied.  A sterile dressing  applied.  The patient was transferred to the recovery room in stable condition. DD:  06/04/00 TD:  06/06/00 Job: [id number]

## 2011-02-06 NOTE — Op Note (Signed)
NAME:  JAKERIA, Sue Neal                           ACCOUNT NO.:  [id number]   MEDICAL RECORD NO.:  [id number]                   PATIENT TYPE:  AMB   LOCATION:  ENDO                                 FACILITY:  [place]   PHYSICIAN:  Ronald Lobo, M.D.                DATE OF BIRTH:  03/19/1946   DATE OF PROCEDURE:  05/05/2004  DATE OF DISCHARGE:                                 OPERATIVE REPORT   PROCEDURE:  Colonoscopy.   INDICATIONS:  Sue Neal is a 65 year old female with polycystic kidney disease  and ongoing abdominal pain, somewhat less frequent than when I saw her  consultatively in the office about a month ago, with the pain predominantly  on the left side of her abdomen.  She has a prior history of a right  hemicolectomy for a large benign villous adenoma.  She also had a small  colonic adenoma removed by me colonoscopically several years ago.  Findings  were scattered left-sided diverticulosis.   DESCRIPTION OF PROCEDURE:  The nature, purpose and risks of the procedure  were familiar to the patient from prior examinations and she provide  consent.  Sedation was fentanyl 30 mcg and Versed 3 mg IV without  arrhythmias or desaturation, although the patient's automated blood pressure  measurement indicated a drop in pressure to the high 0000000 systolic.  During  this time, the patient was in no distress at all, and was clinically stable,  arousable, not diaphoretic, etc.  So it is unclear whether or not an  accurate reading was being obtained.   In any event, the Olympus adult video colonoscope was easily advanced to the  ileocolonic anastomosis after which pull-back was performed.  There was some  mild to moderate left-sided residual diverticulosis but no evidence of  polyps, cancer, colitis, or vascular malformations.  Retroflexion in the  rectum was unremarkable as was reinspection of the rectum.  No biopsies were  obtained.  The patient tolerated the procedure well.  There were no  apparent  complications.   IMPRESSION:  1. Prior history of colon polyp, status post resection, without worrisome     findings on current examination.  (V12.72).  2. Mild to moderate residual left-sided diverticulosis, but no obvious     source of left-sided abdominal pain identified.   PLAN:  Followup colonoscopy in five years in view of the prior history of a  significant colonic adenoma having been surgically resected.                                               Ronald Lobo, M.D.   RB/MEDQ  D:  05/05/2004  T:  05/05/2004  Job:  [id number]   cc:   Windy Kalata, M.D.  [address]  [place]  [place] [address]  Fax: [phone]

## 2011-02-06 NOTE — Op Note (Signed)
[facility]  Patient:    Sue Neal, Sue Neal                        MRN: [id number] Proc. Date: 03/19/00 Adm. Date:  [id number] Attending:  Dorothea Glassman CC:         Judeth Cornfield. Scot Dock, M.D.                           Operative Report  PREOPERATIVE DIAGNOSIS:  Chronic renal failure.  POSTOPERATIVE DIAGNOSIS:  Chronic renal failure.  PROCEDURE:  Placement of new right upper arm AV graft.  SURGEON:  Judeth Cornfield. Scot Dock, M.D.  ASSISTANT:  Nurse.  ANESTHESIA:  Local with sedation.  TECHNIQUE:  The patient was taken to the operating room and sedated by anesthesia. The entire left upper extremity was prepped and draped in the usual sterile fashion. After the skin was infiltrated with 1% lidocaine, an incision was made at the antecubital level. Here the basilic vein was very small and the cephalic vein was also very small. Therefore I dissected free the brachial artery and brachial vein beneath the fascia. These were also very small and not usable for venous outflow. Therefore a separate longitudinal incision was made above the antecubital space. Here the veins were still too small, the artery was thought to be adequate size for inflow. I therefore made a separate longitudinal incision beneath the axilla after the skin was anesthetized. Here again the veins were quite small. I therefore dissected the larger of the 2 branches very high up in the axilla behind the nerve, mobilized multiple branches and then divided it distally. This did dilate up adequately for outflow. A 4-7 mm graft was then tunneled in the upper arm. The patient was then heparinized. Clamps were then placed on the brachial artery proximally and distally and then the longitudinal arteriotomy was made. A short segment of the 4 mm end of the graft was excised, the graft slightly spatulated and sewn end-to-side to the brachial artery using continuous 6-0 Prolene suture. The  graft was then pulled to the appropriate length for anastomosis to the high brachial vein. This was spatulated proximally. The graft was cut to the appropriate length, spatulated and sewn end-to-end to the vein using continuous 6-0 Prolene suture. At the completion, there was a good thrill in the graft. Hemostasis was obtained in the wounds and the wounds were closed with a deep layer of 3-0 Vicryl and the skin closed with 4-0 Vicryl. A sterile dressing was applied. The patient tolerated the procedure well and was transferred to the recovery room in satisfactory condition. All needle and sponge counts were correct. DD:  03/19/00 TD:  03/20/00 Job: [id number]

## 2011-02-06 NOTE — Op Note (Signed)
[place]. [facility]  Patient:    Sue Neal, Sue Neal                        MRN: [id number] Proc. Date: 08/13/00 Adm. Date:  [id number] Attending:  Manuella Ghazi                           Operative Report  PREOPERATIVE DIAGNOSIS:  Chronic renal failure.  POSTOPERATIVE DIAGNOSIS:  Chronic renal failure.  PROCEDURE:  Placement of left subclavian vein Ash catheter (28 cm).  SURGEON:  Judeth Cornfield. Scot Dock, M.D.  ASSISTANT:  Nurse.  ANESTHESIA:  Local with sedation.  TECHNIQUE:  The patient was taken to the operating room and sedated by anesthesia.  The neck and upper chest were prepped and draped in usual sterile fashion. I had previously looked with the ultrasound and could not identify the jugular vein in the usual position.  Some type of vein was identified very far medially on the left.  I was able to cannulate this after the skin was anesthetized.  However, the guidewire would not pass.  I then tried to cannulate the right IJ at the site of the previous catheter, which had just been removed.  However, was unsuccessful in cannulating the vein.  Therefore, I decided to place a left subclavian vein catheter.  The left subclavian vein was cannulated after the skin was anesthetized.  A guidewire was introduced into the superior vena cava.  The exit site for the catheter was chosen and the skin anesthetized between the two areas.  A 28 cm catheter was passed between the two incisions.  The tract was then dilated and then the dilator and peel-away sheath were passed over the wire and the wire and dilator were removed.  The catheter was passed through the peel-away sheath.  I could not get down to the get both ports to go down the superior vena cava as they both wanted to tract into an aberrant branch, perhaps an azygos branch.  I was able to pull the catheter back and then get an angled glidewire down both ports to manipulate it down into the superior  vena cava.  I then advanced the catheter over both of these wires into the superior vena cava.  At this point, the wires were removed.  Both ports withdrew easily and were then flushed with heparinized saline and filled with concentrated heparin.  The catheter was secured at its exit site with a 3-0 nylon suture.  The IJ cannulation site was closed with a 4-0 subcuticular stitch.  A sterile dressing was applied.  The patient tolerated the procedure well and was transferred to the recovery room in satisfactory condition.  All needle and sponge counts were correct. DD:  08/13/00 TD:  08/13/00 Job: [id number]

## 2011-02-06 NOTE — Op Note (Signed)
[facility]  Patient:    Sue Neal, Sue Neal                        MRN: [id number] Proc. Date: 05/31/00 Adm. Date:  [id number] Attending:  Dorothea Glassman                           Operative Report  PREOPERATIVE DIAGNOSIS:  Chronic renal failure.  POSTOPERATIVE DIAGNOSIS:  Chronic renal failure.  PROCEDURE:  Removal of Schon catheter.  SURGEON:  Judeth Cornfield. Scot Dock, M.D.  ANESTHESIA:  Local with sedation.  DESCRIPTION OF PROCEDURE:  The patient was taken to the operating room and sedated by anesthesia.  The neck and upper chest were prepped and draped in the usual sterile fashion.  After the skin was anesthetized with 1% lidocaine, a transverse incision was made over the I-J cannulation site.  The catheter here was removed and divided after it was clamped.  The distal aspect of the catheter was removed, and pressure was held for hemostasis.  The proximal catheter ports were then removed.  The wound was then closed with a deep layer of 3-0 Vicryl and the skin with 4-0 Vicryl.  Sterile dressing was applied. The patient tolerated the procedure well and was transferred to the recovery room in satisfactory condition.  All needle and sponge counts were correct. DD:  05/31/00 TD:  05/31/00 Job: [id number]

## 2011-02-06 NOTE — Procedures (Signed)
[place]. [facility]  Patient:    Sue Neal, Sue Neal                        MRN: [id number] Proc. Date: 05/03/00 Adm. Date:  [id number] Attending:  Ernie Avena CC:         Joyice Faster. Deterding, M.D.   Procedure Report  PROCEDURE:  Upper endoscopy with biopsies.  INDICATIONS FOR PROCEDURE:  A 65 year old female, dialysis patient, with GI series, performed because of nonspecific abdominal pain, showing antral mucosal thickening and irregular folds.  FINDINGS:  Erythematous antral folds without evidence of tumor or ulcer.  DESCRIPTION OF PROCEDURE:  The nature, purpose, and risks of the procedure had been discussed with the patient who had provided written consent.  Sedation with fentanyl 50 mcg and Versed 6 mg IV without arrhythmias or desaturation. The Olympus small caliber video endoscope was passed under direct vision.  The vocal cords were not seen.  The esophagus was quite easily entered and had normal mucosa, without evidence of reflux esophagitis, Barretts esophagus, varices, infection, or neoplasia.  There was a very sharp demarcation between the esophageal and the gastric mucosa, but no ring or stricture was appreciated.  There was perhaps a 1 cm hiatal hernia, nothing impressive.  The stomach was entered.  It contained a small clear residual.  No blood or coffee grounds were seen.  The antrum of the stomach had somewhat prominent folds which were quite erythematous with "sand paper" erythema, but no erosions, ulcers, polyps, or masses were observed, including careful inspection inside the folds to look for any ulcers and crevices.  The pylorus was patent and without evidence of scarring or stenosis.  The duodenal bulb and second duodenum looked normal.  The scope was withdrawn back into the stomach where biopsies of the prominent antral folds were obtained, and the retroflex view of the proximal stomach was unremarkable, as was inspection  of the remainder of the stomach.  The scope was then removed from the patient who tolerated the procedure well and without apparent complication.  IMPRESSION:  Prominent erythematous antral folds, benign in appearance, presumably corresponding to those seen on the recent upper GI series. Otherwise, unremarkable endoscopic evaluation.  PLAN:  Await pathology on the biopsies, looking for evidence of H. pylori infection.  There really is no endoscopic concern of malignancy here. DD:  05/03/00 TD:  05/03/00 Job: [id number]

## 2011-02-06 NOTE — Op Note (Signed)
[place]. [facility]  Patient:    Sue Neal, Sue Neal                        MRN: [id number] Proc. Date: 12/03/00 Adm. Date:  [id number] Attending:  Dorothea Glassman                           Operative Report  PREOPERATIVE DIAGNOSIS:  Chronic renal failure.  POSTOPERATIVE DIAGNOSIS:  Chronic renal failure.  PROCEDURE:  Placement of new left thigh arteriovenous graft.  SURGEON:  Judeth Cornfield. Scot Dock, M.D.  ASSISTANT:  Vernard Gambles, P.A.  ANESTHESIA:  General.  DESCRIPTION OF PROCEDURE:  The patient was taken to the operating room and received a general anesthetic.  The left groin and thigh were prepped and draped in the usual sterile fashion.  A small oblique incision was made in the left groin.  Through this incision, the superficial femoral artery was dissected free and then further proximally, the deep femoral and common femoral arteries were dissected free.  Next, through the same incision, the saphenous vein was dissected free below the level of the saphenofemoral junction.  This was in the medial part of the incision.  Next, a 4-7 mm graft was then tunneled in a loop fashion in the thigh.  This was done using one distal counter incision.  The patient was then heparinized.  Clamps were placed proximally and distally on the common femoral, superficial femoral, and deep femoral arteries and then a longitudinal arteriotomy made in the common femoral artery.  A segment of the 4 mm end of the graft was excised, the graft slightly spatulated and sewn end-to-side to the common femoral artery using continuous 6-0 Prolene suture.  At the completion, the graft was pulled to the appropriate length for anastomosis to the saphenous vein.  This was clamped proximally and distally, and a longitudinal venotomy was made.  The graft was cut to the appropriate length, spatulated, and sewn end-to-side to the vein using continuous 6-0 Prolene suture.  At the  completion, there was an excellent thrill in the graft.  Hemostasis was obtained in the wound.  The wounds were closed with a deep layer of 3-0 Vicryl and the skin closed with 4-0 Vicryl.  A sterile dressing was applied.  The patient tolerated the procedure well and was transferred to the recovery room in satisfactory condition.  All needle and sponge counts were correct. DD:  12/03/00 TD:  12/03/00 Job: [id number]

## 2011-02-25 ENCOUNTER — Other Ambulatory Visit (HOSPITAL_COMMUNITY): Payer: Self-pay | Admitting: Nephrology

## 2011-02-25 DIAGNOSIS — N186 End stage renal disease: Secondary | ICD-10-CM

## 2011-03-10 ENCOUNTER — Ambulatory Visit (HOSPITAL_COMMUNITY)
Admission: RE | Admit: 2011-03-10 | Discharge: 2011-03-10 | Disposition: A | Discharge status: Home / Self Care | Payer: Medicare Other | Source: Ambulatory Visit | Attending: Nephrology | Admitting: Nephrology

## 2011-03-10 DIAGNOSIS — N2581 Secondary hyperparathyroidism of renal origin: Secondary | ICD-10-CM | POA: Insufficient documentation

## 2011-03-10 DIAGNOSIS — M549 Dorsalgia, unspecified: Secondary | ICD-10-CM | POA: Insufficient documentation

## 2011-03-10 DIAGNOSIS — D649 Anemia, unspecified: Secondary | ICD-10-CM | POA: Insufficient documentation

## 2011-03-10 DIAGNOSIS — N186 End stage renal disease: Secondary | ICD-10-CM

## 2011-03-10 DIAGNOSIS — G8929 Other chronic pain: Secondary | ICD-10-CM | POA: Insufficient documentation

## 2011-03-10 DIAGNOSIS — D259 Leiomyoma of uterus, unspecified: Secondary | ICD-10-CM | POA: Insufficient documentation

## 2011-03-10 DIAGNOSIS — I9589 Other hypotension: Secondary | ICD-10-CM | POA: Insufficient documentation

## 2011-03-10 MED ORDER — IOHEXOL 300 MG/ML  SOLN: 58.0000 mL | Freq: Once | INTRAMUSCULAR | Status: AC | PRN

## 2011-12-07 ENCOUNTER — Other Ambulatory Visit: Payer: Self-pay | Admitting: Family Medicine

## 2011-12-07 DIAGNOSIS — Z1231 Encounter for screening mammogram for malignant neoplasm of breast: Secondary | ICD-10-CM

## 2011-12-22 ENCOUNTER — Other Ambulatory Visit: Payer: Self-pay | Admitting: Gynecology

## 2011-12-29 ENCOUNTER — Ambulatory Visit: Payer: Medicare Other

## 2012-01-05 ENCOUNTER — Ambulatory Visit
Admission: RE | Admit: 2012-01-05 | Discharge: 2012-01-05 | Disposition: A | Discharge status: Home / Self Care | Payer: Medicare Other | Source: Ambulatory Visit | Attending: Family Medicine | Admitting: Family Medicine

## 2012-01-05 DIAGNOSIS — Z1231 Encounter for screening mammogram for malignant neoplasm of breast: Secondary | ICD-10-CM

## 2012-02-03 ENCOUNTER — Encounter: Payer: Self-pay | Admitting: Vascular Surgery

## 2012-02-04 ENCOUNTER — Ambulatory Visit (INDEPENDENT_AMBULATORY_CARE_PROVIDER_SITE_OTHER): Payer: Medicare Other | Admitting: Vascular Surgery

## 2012-02-04 ENCOUNTER — Encounter: Payer: Self-pay | Admitting: Vascular Surgery

## 2012-02-04 VITALS — BP 146/72 | HR 86 | Resp 18 | Ht 62.0 in | Wt 195.0 lb

## 2012-02-04 DIAGNOSIS — IMO0002 Reserved for concepts with insufficient information to code with codable children: Secondary | ICD-10-CM

## 2012-02-04 DIAGNOSIS — T82898A Other specified complication of vascular prosthetic devices, implants and grafts, initial encounter: Secondary | ICD-10-CM

## 2012-02-04 DIAGNOSIS — N186 End stage renal disease: Secondary | ICD-10-CM

## 2012-02-04 NOTE — Progress Notes (Signed)
[facility] [place] HISTORY AND PHYSICAL    History of Present Illness:  Patient is a 66 y.o. year old female who presents for evaluation of bleeding from her left thigh AV graft.  She states she has had recent episodes with bleeding around the dialysis needle and prolonged bleeding after dialysis. She currently dialyzes on Monday Wednesday and Friday. She does not know any prior episodes of occlusion of the thigh graft.  Past Medical History  Diagnosis Date  . Anemia   . Hypertension   . Hyperparathyroidism, secondary   . Back pain, chronic   . Uterine fibroid    Past Surgical History  Procedure Date  . Multiple failed grafts     left thigh AVG 12/03/00, clotted -05/31/03, 01/24/04, 08/28/04, 09/06/04( thrombectomy and revision )Left AVG declot procedure including complete AV shuntogram, 08/29/04 left AV thrombolysis and angioplasty 2012 shunto gram to left thigh AVG  . Hernia repair     laparoscopic repair during a [place] hospitalization from 03/26/2006-03/30/2006  . Knee contusion   . Hemicolectomy 02/1996    History  Substance Use Topics  . Smoking status: Former Smoker -- 0.1 packs/day    Types: Cigarettes    Quit date: 02/03/1978  . Smokeless tobacco: Never Used  . Alcohol Use: Yes    Review of systems: Patient denies chest pain. Patient has shortness of breath with exertion. Current Outpatient Prescriptions on File Prior to Visit  Medication Sig Dispense Refill  . amLODipine (NORVASC) 2.5 MG tablet Take 2.5 mg by mouth. Tuesday, Thursday, Saturday      . cinacalcet (SENSIPAR) 90 MG tablet Take 90 mg by mouth daily.      . clonazePAM (KLONOPIN) 0.5 MG tablet Take 0.5 mg by mouth. Monday, Wednesday,Friday      . diphenhydramine-acetaminophen (TYLENOL PM) 25-500 MG TABS Take 1 tablet by mouth at bedtime as needed.      . multivitamin (RENA-VIT) TABS tablet Take 1 tablet by mouth daily.      Marland Kitchen PROTEIN PO Take by mouth as needed. 2 teaspoons occasionally       . sevelamer (RENVELA) 800 MG tablet Take 800 mg by mouth 3 (three) times daily with meals.       Allergies: None   Physical Examination  Filed Vitals:   02/04/12 1533  BP: 146/72  Pulse: 86  Resp: 18    Body mass index is 35.67 kg/(m^2).  General:  Alert and oriented, no acute distress Neck: No bruit or JVD Skin: No rash Extremities:  Left thigh AV graft with pseudoaneurysmal degeneration of the medial and lateral segment. There was one small eschar on the medial portion which is the area that has been bleeding Neurologic: Upper and lower extremity motor 5/5 and symmetric  DATA: Duplex ultrasound of her left thigh graft was performed today which again confirms lateral and medial pseudoaneurysms. However there is also suggestion of narrowing of the venous anastomosis with velocities a 629 cm/s.   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 I will schedule the patient for a shuntogram with my partner Dr. Trula Slade on Tuesday, May 28 at which time if there is a venous narrowing we could potentially do an angioplasty of this. She may also be a candidate for covered stent of her pseudoaneurysm. If this is not possible in the PV lab after Dr. Stephens Shire study I will revise her graft in the operating room.  Risks benefits possible  complications and procedure details were explained the patient today. She had several questions regarding why she might possibly need to procedures. I tried to explain this to the best of my abilities. I think she understood this fairly well at the end of the conversation  Ruta Hinds, MD [facility] [place] Office: [phone] Pager: [phone]    PLAN:   Ruta Hinds, MD [facility] [place]: [phone] Pager: [phone]

## 2012-02-09 ENCOUNTER — Other Ambulatory Visit: Payer: Self-pay

## 2012-02-12 ENCOUNTER — Encounter (HOSPITAL_COMMUNITY): Payer: Self-pay | Admitting: *Deleted

## 2012-02-12 NOTE — Procedures (Unsigned)
VASCULAR LAB EXAM  INDICATION:  Malfunctioning left thigh arteriovenous dialysis access graft.  HISTORY: Diabetes:  No. Cardiac: Hypertension:  No.  EXAM:  Left arteriovenous dialysis thigh graft duplex.  IMPRESSION: 1. Patent left arterial inflow and outflow. 2. There are 2 aneurysmal dilatations present at needle insertion     sites, arterial side measures 1.97 cm x 1.82 cm and the venous side     measures 2.15 cm x 1.98 cm, with minimal intramural thrombus     present. 3. Elevated velocities with diameter changes suggesting stenosis     involving the left venous anastomosis. 4. Venous outflow appears patent involving the left  distal external     iliac vein with elevated turbulent flow present.  ___________________________________________ Jessy Oto. Fields, MD  SH/MEDQ  D:  02/04/2012  T:  02/04/2012  Job:  [id number]

## 2012-02-15 ENCOUNTER — Encounter (HOSPITAL_COMMUNITY): Payer: Self-pay | Admitting: Anesthesiology

## 2012-02-15 MED ORDER — CEFAZOLIN SODIUM-DEXTROSE 2-3 GM-% IV SOLR
2.0000 g | Freq: Once | INTRAVENOUS | Status: DC
  Filled 2012-02-15: qty 50

## 2012-02-16 ENCOUNTER — Telehealth: Payer: Self-pay | Admitting: Vascular Surgery

## 2012-02-16 ENCOUNTER — Encounter (HOSPITAL_COMMUNITY)
Admission: RE | Disposition: A | Discharge status: Home / Self Care | Payer: Self-pay | Source: Ambulatory Visit | Attending: Surgery

## 2012-02-16 ENCOUNTER — Ambulatory Visit (HOSPITAL_COMMUNITY)
Admission: RE | Admit: 2012-02-16 | Discharge: 2012-02-16 | Disposition: A | Discharge status: Home / Self Care | Payer: Medicare Other | Source: Ambulatory Visit | Attending: Surgery | Admitting: Surgery

## 2012-02-16 DIAGNOSIS — G8929 Other chronic pain: Secondary | ICD-10-CM | POA: Insufficient documentation

## 2012-02-16 DIAGNOSIS — T82898A Other specified complication of vascular prosthetic devices, implants and grafts, initial encounter: Secondary | ICD-10-CM

## 2012-02-16 DIAGNOSIS — Y832 Surgical operation with anastomosis, bypass or graft as the cause of abnormal reaction of the patient, or of later complication, without mention of misadventure at the time of the procedure: Secondary | ICD-10-CM | POA: Insufficient documentation

## 2012-02-16 DIAGNOSIS — N2581 Secondary hyperparathyroidism of renal origin: Secondary | ICD-10-CM | POA: Insufficient documentation

## 2012-02-16 DIAGNOSIS — D259 Leiomyoma of uterus, unspecified: Secondary | ICD-10-CM | POA: Insufficient documentation

## 2012-02-16 DIAGNOSIS — N186 End stage renal disease: Secondary | ICD-10-CM

## 2012-02-16 DIAGNOSIS — I871 Compression of vein: Secondary | ICD-10-CM | POA: Insufficient documentation

## 2012-02-16 DIAGNOSIS — D649 Anemia, unspecified: Secondary | ICD-10-CM | POA: Insufficient documentation

## 2012-02-16 DIAGNOSIS — I120 Hypertensive chronic kidney disease with stage 5 chronic kidney disease or end stage renal disease: Secondary | ICD-10-CM | POA: Insufficient documentation

## 2012-02-16 DIAGNOSIS — I721 Aneurysm of artery of upper extremity: Secondary | ICD-10-CM | POA: Insufficient documentation

## 2012-02-16 DIAGNOSIS — M549 Dorsalgia, unspecified: Secondary | ICD-10-CM | POA: Insufficient documentation

## 2012-02-16 HISTORY — PX: SHUNTOGRAM: SHX6095

## 2012-02-16 HISTORY — PX: SHUNTOGRAM: SHX5510

## 2012-02-16 LAB — POCT I-STAT, CHEM 8
Calcium, Ion: 1.14 mmol/L (ref 1.12–1.32)
Chloride: 100 mEq/L (ref 96–112)
Glucose, Bld: 100 mg/dL — ABNORMAL HIGH (ref 70–99)
HCT: 36 % (ref 36.0–46.0)
TCO2: 33 mmol/L (ref 0–100)

## 2012-02-16 SURGERY — SHUNTOGRAM
Anesthesia: LOCAL

## 2012-02-16 SURGERY — REVISION OF ARTERIOVENOUS GORETEX GRAFT
Anesthesia: General

## 2012-02-16 MED ORDER — FENTANYL CITRATE 0.05 MG/ML IJ SOLN
INTRAMUSCULAR | Status: AC
  Filled 2012-02-16: qty 2

## 2012-02-16 MED ORDER — SODIUM CHLORIDE 0.9 % IJ SOLN: 3.0000 mL | INTRAMUSCULAR | Status: DC | PRN

## 2012-02-16 MED ORDER — LIDOCAINE HCL (PF) 1 % IJ SOLN
INTRAMUSCULAR | Status: AC
  Filled 2012-02-16: qty 30

## 2012-02-16 MED ORDER — HEPARIN (PORCINE) IN NACL 2-0.9 UNIT/ML-% IJ SOLN
INTRAMUSCULAR | Status: AC
  Filled 2012-02-16: qty 1000

## 2012-02-16 MED ORDER — MIDAZOLAM HCL 2 MG/2ML IJ SOLN
INTRAMUSCULAR | Status: AC
  Filled 2012-02-16: qty 2

## 2012-02-16 MED ORDER — HEPARIN SODIUM (PORCINE) 1000 UNIT/ML IJ SOLN
INTRAMUSCULAR | Status: AC
  Filled 2012-02-16: qty 1

## 2012-02-16 SURGICAL SUPPLY — 32 items
ADH SKN CLS APL DERMABOND .7 (GAUZE/BANDAGES/DRESSINGS) ×1
CANISTER SUCTION 2500CC (MISCELLANEOUS) ×3 IMPLANT
CLIP TI MEDIUM 6 (CLIP) ×3 IMPLANT
CLIP TI WIDE RED SMALL 6 (CLIP) ×3 IMPLANT
CLOTH BEACON ORANGE TIMEOUT ST (SAFETY) ×3 IMPLANT
COVER SURGICAL LIGHT HANDLE (MISCELLANEOUS) ×6 IMPLANT
DECANTER SPIKE VIAL GLASS SM (MISCELLANEOUS) ×3 IMPLANT
DERMABOND ADVANCED (GAUZE/BANDAGES/DRESSINGS) ×1
DERMABOND ADVANCED .7 DNX12 (GAUZE/BANDAGES/DRESSINGS) ×2 IMPLANT
ELECT REM PT RETURN 9FT ADLT (ELECTROSURGICAL) ×2
ELECTRODE REM PT RTRN 9FT ADLT (ELECTROSURGICAL) ×2 IMPLANT
GAUZE SPONGE 2X2 8PLY STRL LF (GAUZE/BANDAGES/DRESSINGS) ×2 IMPLANT
GEL ULTRASOUND 20GR AQUASONIC (MISCELLANEOUS) IMPLANT
GLOVE BIO SURGEON STRL SZ7.5 (GLOVE) ×3 IMPLANT
GOWN PREVENTION PLUS XLARGE (GOWN DISPOSABLE) ×3 IMPLANT
GOWN STRL NON-REIN LRG LVL3 (GOWN DISPOSABLE) ×6 IMPLANT
KIT BASIN OR (CUSTOM PROCEDURE TRAY) ×3 IMPLANT
KIT ROOM TURNOVER OR (KITS) ×3 IMPLANT
LOOP VESSEL MINI RED (MISCELLANEOUS) IMPLANT
NS IRRIG 1000ML POUR BTL (IV SOLUTION) ×3 IMPLANT
PACK CV ACCESS (CUSTOM PROCEDURE TRAY) ×3 IMPLANT
PAD ARMBOARD 7.5X6 YLW CONV (MISCELLANEOUS) ×6 IMPLANT
SPONGE GAUZE 2X2 STER 10/PKG (GAUZE/BANDAGES/DRESSINGS) ×1
SPONGE SURGIFOAM ABS GEL 100 (HEMOSTASIS) IMPLANT
SUT PROLENE 6 0 CC (SUTURE) ×3 IMPLANT
SUT VIC AB 3-0 SH 27 (SUTURE) ×2
SUT VIC AB 3-0 SH 27X BRD (SUTURE) ×2 IMPLANT
SUT VICRYL 4-0 PS2 18IN ABS (SUTURE) ×3 IMPLANT
TOWEL OR 17X24 6PK STRL BLUE (TOWEL DISPOSABLE) ×3 IMPLANT
TOWEL OR 17X26 10 PK STRL BLUE (TOWEL DISPOSABLE) ×3 IMPLANT
UNDERPAD 30X30 INCONTINENT (UNDERPADS AND DIAPERS) ×3 IMPLANT
WATER STERILE IRR 1000ML POUR (IV SOLUTION) ×3 IMPLANT

## 2012-02-16 NOTE — H&P (View-Only) (Signed)
[facility] [place] HISTORY AND PHYSICAL    History of Present Illness:  Patient is a 66 y.o. year old female who presents for evaluation of bleeding from her left thigh AV graft.  She states she has had recent episodes with bleeding around the dialysis needle and prolonged bleeding after dialysis. She currently dialyzes on Monday Wednesday and Friday. She does not know any prior episodes of occlusion of the thigh graft.  Past Medical History  Diagnosis Date  . Anemia   . Hypertension   . Hyperparathyroidism, secondary   . Back pain, chronic   . Uterine fibroid    Past Surgical History  Procedure Date  . Multiple failed grafts     left thigh AVG 12/03/00, clotted -05/31/03, 01/24/04, 08/28/04, 09/06/04( thrombectomy and revision )Left AVG declot procedure including complete AV shuntogram, 08/29/04 left AV thrombolysis and angioplasty 2012 shunto gram to left thigh AVG  . Hernia repair     laparoscopic repair during a [place] hospitalization from 03/26/2006-03/30/2006  . Knee contusion   . Hemicolectomy 02/1996    History  Substance Use Topics  . Smoking status: Former Smoker -- 0.1 packs/day    Types: Cigarettes    Quit date: 02/03/1978  . Smokeless tobacco: Never Used  . Alcohol Use: Yes    Review of systems: Patient denies chest pain. Patient has shortness of breath with exertion. Current Outpatient Prescriptions on File Prior to Visit  Medication Sig Dispense Refill  . amLODipine (NORVASC) 2.5 MG tablet Take 2.5 mg by mouth. Tuesday, Thursday, Saturday      . cinacalcet (SENSIPAR) 90 MG tablet Take 90 mg by mouth daily.      . clonazePAM (KLONOPIN) 0.5 MG tablet Take 0.5 mg by mouth. Monday, Wednesday,Friday      . diphenhydramine-acetaminophen (TYLENOL PM) 25-500 MG TABS Take 1 tablet by mouth at bedtime as needed.      . multivitamin (RENA-VIT) TABS tablet Take 1 tablet by mouth daily.      Marland Kitchen PROTEIN PO Take by mouth as needed. 2 teaspoons occasionally       . sevelamer (RENVELA) 800 MG tablet Take 800 mg by mouth 3 (three) times daily with meals.       Allergies: None   Physical Examination  Filed Vitals:   02/04/12 1533  BP: 146/72  Pulse: 86  Resp: 18    Body mass index is 35.67 kg/(m^2).  General:  Alert and oriented, no acute distress Neck: No bruit or JVD Skin: No rash Extremities:  Left thigh AV graft with pseudoaneurysmal degeneration of the medial and lateral segment. There was one small eschar on the medial portion which is the area that has been bleeding Neurologic: Upper and lower extremity motor 5/5 and symmetric  DATA: Duplex ultrasound of her left thigh graft was performed today which again confirms lateral and medial pseudoaneurysms. However there is also suggestion of narrowing of the venous anastomosis with velocities a 629 cm/s.   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 I will schedule the patient for a shuntogram with my partner Dr. Trula Slade on Tuesday, May 28 at which time if there is a venous narrowing we could potentially do an angioplasty of this. She may also be a candidate for covered stent of her pseudoaneurysm. If this is not possible in the PV lab after Dr. Stephens Shire study I will revise her graft in the operating room.  Risks benefits possible  complications and procedure details were explained the patient today. She had several questions regarding why she might possibly need to procedures. I tried to explain this to the best of my abilities. I think she understood this fairly well at the end of the conversation  Ruta Hinds, MD [facility] [place] [facility] Office: [phone] Pager: [phone]    PLAN:   Ruta Hinds, MD [facility] [place]: [phone] Pager: [phone]

## 2012-02-16 NOTE — Telephone Encounter (Signed)
Message copied by Renaldo Harrison on Tue Feb 16, 2012 12:19 PM ------      Message from: Denman George      Created: Tue Feb 16, 2012 11:29 AM      Regarding: 1 mo f/u w/ CE[facility]                   ----- Message -----         From: Serafina Mitchell, MD         Sent: 02/16/2012   8:35 AM           To: Milus Mallick, RN            02/16/2012, the patient had the following procedures.             1.  ultrasound access left thigh graft       2.  left thigh shuntogram       3.  angioplasty, venous anastomosis       4.  stent, medial graft pseudoaneurysm                  Please have her come back to see Dr. Oneida Alar in one month.      Please note that I charged for 2 procedures. This was because I felt we did 2 separate interventions. The first was angioplasty of a stenosis at the venous outflow tract. The second was coverage of a pseudoaneurysm with a covered stent in the medial portion of the graft. Please verify that these are appropriate and update me. Thank you

## 2012-02-16 NOTE — Interval H&P Note (Signed)
History and Physical Interval Note:  02/16/2012 7:09 AM  Sue Neal  has presented today for surgery, with the diagnosis of avg complication  The various methods of treatment have been discussed with the patient and family. After consideration of risks, benefits and other options for treatment, the patient has consented to  Procedure(s) (LRB): SHUNTOGRAM (N/A) as a surgical intervention .  The patients' history has been reviewed, patient examined, no change in status, stable for surgery.  I have reviewed the patients' chart and labs.  Questions were answered to the patient's satisfaction.     Yazleemar Strassner IV, V. WELLS

## 2012-02-16 NOTE — Telephone Encounter (Signed)
Spoke with the patient to confirm her f/u appt date and time

## 2012-02-16 NOTE — Op Note (Signed)
[facility]  Patient name: Sue Neal MRN: [id number] DOB: 09-10-1946 Sex: female  02/16/2012 Pre-operative Diagnosis: End-stage renal disease  Post-operative diagnosis:  Same Surgeon:  Eldridge Abrahams Procedure Performed:  1.  ultrasound access left thigh graft  2.  left thigh shuntogram  3.  angioplasty, venous anastomosis  4.  stent, medial graft pseudoaneurysm   Indications:  The patient was seen and evaluated by Dr. Oneida Alar. Ultrasound revealed an anastomotic stenosis at the venous outflow tract. She also has a pseudoaneurysm on both the medial and lateral aspects of her graft. The medial side of the graft has been bleeding. She comes in today for further evaluation and possible intervention.  Procedure:  The patient was identified in the holding area and taken to room 8.  The patient was then placed supine on the table and prepped and draped in the usual sterile fashion.  A time out was called.  Ultrasound was used to evaluate the fistula.  The vein was patent and compressible.  A digital ultrasound image was acquired.  The fistula was then accessed under ultrasound guidance using a micropuncture needle.  An 018 wire was then asvanced without resistance and a micropuncture sheath was placed.  Contrast injections were then performed through the sheath.  Findings:  The left thigh graft is patent throughout it's course. 2 pseudoaneurysms are seen on the medial and lateral aspect of the graft. There is an area of stenosis within the graft near the venous anastomosis. There also appears to be a stenosis at the level of the venous anastomosis. This correlates with elevated velocities on ultrasound. The arterial anastomosis and arterial limb of the graft is widely patent.   Intervention:  Over a 035 Rosen wire an 8 [place] sheath was placed. The patient was given 3000 units of heparin. I selected a Gore ViaBahn 7 x 100  covered stent.  This was centered over the  medial graft pseudoaneurysm which was the one that has been bleeding. It was then deployed. The graft was then molded to confirmation with a 7 mm balloon. I then perform primary balloon angioplasty of the venous outflow tract as well as the venous limb of the graft were she had areas of stenosis. A 7 mm balloon was used to perform angioplasty. He was taken to 20 atmospheres and held for 1 minute. Completion arteriogram was performed which revealed resolution of the pseudoaneurysm as well as the venous outflow tract stenosis. At this point in time the decision was made to terminate the procedure. Catheters and wires were removed. The access site was closed with a 3-0 nylon and a stopcock.  Impression:  #1  successful angioplasty of the venous outflow tract. A 7 mm balloon was used.  #2  successful exclusion of the medial graft pseudoaneurysm using a 7 x 100 ViaBahn stent  #3  residual pseudoaneurysm on the lateral side of the graft    V. Annamarie Major, M.D. [facility] Office: [phone] Pager:  [phone]

## 2012-03-16 ENCOUNTER — Encounter: Payer: Self-pay | Admitting: Vascular Surgery

## 2012-03-17 ENCOUNTER — Encounter: Payer: Self-pay | Admitting: Vascular Surgery

## 2012-03-17 ENCOUNTER — Ambulatory Visit (INDEPENDENT_AMBULATORY_CARE_PROVIDER_SITE_OTHER): Payer: Medicare Other | Admitting: Vascular Surgery

## 2012-03-17 VITALS — BP 126/49 | HR 68 | Resp 18 | Ht 62.0 in | Wt 193.0 lb

## 2012-03-17 DIAGNOSIS — N186 End stage renal disease: Secondary | ICD-10-CM

## 2012-03-17 NOTE — Progress Notes (Signed)
This is a 66 year old female who returns for followup today. She recently underwent placement of a Viabahn graft for pseudoaneurysm of her left thigh graft. She also underwent angioplasty of the venous anastomosis at the same time. She reports that her graft is working well on dialysis.  Review of systems: She denies shortness of breath. She denies chest pain.  Physical exam:  Filed Vitals:   03/17/12 1054  BP: 126/49  Pulse: 68  Resp: 18  Height: 5\' 2"  (1.575 m)  Weight: 193 lb (87.544 kg)   Left lower extremity: Left thigh graft has audible bruit the pseudoaneurysm on the medial aspect of the graft is completely resolved she still has a 2 cm diameter pseudoaneurysm on the lateral aspect of the graft. However there is no skin breakdown ulceration or rapid growth of this  Assessment: Functioning left thigh graft with small pseudoaneurysm lateral limb  Plan: Followup on as-needed basis if the pseudoaneurysm is enlarging or if her graft pressures on dialysis are increasing or decreased flow  Ruta Hinds, MD [facility] [place]: [phone] Pager: [phone]

## 2012-06-09 ENCOUNTER — Other Ambulatory Visit (HOSPITAL_COMMUNITY): Payer: Self-pay | Admitting: Nephrology

## 2012-06-09 DIAGNOSIS — N186 End stage renal disease: Secondary | ICD-10-CM

## 2012-06-16 ENCOUNTER — Ambulatory Visit (HOSPITAL_COMMUNITY)
Admission: RE | Admit: 2012-06-16 | Discharge: 2012-06-16 | Disposition: A | Discharge status: Home / Self Care | Payer: Medicare Other | Source: Ambulatory Visit | Attending: Nephrology | Admitting: Nephrology

## 2012-06-16 ENCOUNTER — Other Ambulatory Visit (HOSPITAL_COMMUNITY): Payer: Self-pay | Admitting: Nephrology

## 2012-06-16 DIAGNOSIS — N186 End stage renal disease: Secondary | ICD-10-CM

## 2012-06-16 DIAGNOSIS — T82898A Other specified complication of vascular prosthetic devices, implants and grafts, initial encounter: Secondary | ICD-10-CM | POA: Insufficient documentation

## 2012-06-16 DIAGNOSIS — Y832 Surgical operation with anastomosis, bypass or graft as the cause of abnormal reaction of the patient, or of later complication, without mention of misadventure at the time of the procedure: Secondary | ICD-10-CM | POA: Insufficient documentation

## 2012-06-16 MED ORDER — IOHEXOL 300 MG/ML  SOLN
100.0000 mL | Freq: Once | INTRAMUSCULAR | Status: AC | PRN
  Administered 2012-06-16: 45 mL via INTRAVENOUS

## 2012-06-17 ENCOUNTER — Telehealth (HOSPITAL_COMMUNITY): Payer: Self-pay | Admitting: *Deleted

## 2012-06-21 ENCOUNTER — Ambulatory Visit (INDEPENDENT_AMBULATORY_CARE_PROVIDER_SITE_OTHER): Payer: Medicare Other | Admitting: Vascular Surgery

## 2012-06-21 ENCOUNTER — Other Ambulatory Visit: Payer: Self-pay

## 2012-06-21 ENCOUNTER — Encounter: Payer: Self-pay | Admitting: Vascular Surgery

## 2012-06-21 ENCOUNTER — Telehealth: Payer: Self-pay

## 2012-06-21 VITALS — BP 113/58 | HR 93 | Temp 98.3°F | Resp 16 | Ht 62.0 in | Wt 192.6 lb

## 2012-06-21 DIAGNOSIS — N186 End stage renal disease: Secondary | ICD-10-CM

## 2012-06-21 DIAGNOSIS — T82898A Other specified complication of vascular prosthetic devices, implants and grafts, initial encounter: Secondary | ICD-10-CM

## 2012-06-21 DIAGNOSIS — Z48812 Encounter for surgical aftercare following surgery on the circulatory system: Secondary | ICD-10-CM | POA: Insufficient documentation

## 2012-06-21 NOTE — Patient Instructions (Signed)
Pre-op instructions given regarding NPO status and medications to take prior to surgery this Thursday 06/23/12

## 2012-06-21 NOTE — Telephone Encounter (Signed)
Rec'd call from [place], [place].  Stated pt. has had oozing from left thigh AVG pseudoaneurysm x 2 days.  Stated that Dr. Lorrene Reid wants pt. Seen ASAP.  Offered appt. today at 3:30 PM.  Attempted to call pt./ left voice message to call office re: confirmation she can make 3:30 PM appt.

## 2012-06-21 NOTE — Progress Notes (Addendum)
[facility] [place] HISTORY AND PHYSICAL   CC: Bleeding from left Thigh AVGG Referring Physician: Dr. Moshe Cipro HD Sue Neal, F  History of Present Illness: Sue Neal is a 66 y.o. female Who has had a left thigh loop graft for 5 years. Pt states in the last few weeks the medial side of the graft which has been used frequently began to bleed during and post HD from one of the pin holes. This area has continued to separate. They have stopped sticking this area so there has been no further bleeding , however the skin in this area is quite thin.  She denies fever,chills or Purulent drainage.   No results found.  Past Medical History  Diagnosis Date  . Anemia   . Hypertension   . Hyperparathyroidism, secondary   . Back pain, chronic   . Uterine fibroid   . Chronic kidney disease     ROS: [x]  Positive   [ ]  Denies    General: [ ]  Weight loss, [ ]  Fever, [ ]  chills Neurologic: [ ]  Dizziness, [ ]  Blackouts, [ ]  Seizure [ ]  Stroke, [ ]  "Mini stroke", [ ]  Slurred speech, [ ]  Temporary blindness; [ ]  weakness in arms or legs, [ ]  Hoarseness Cardiac: [ ]  Chest pain/pressure, [ ]  Shortness of breath at rest [ ]  Shortness of breath with exertion, [ ]  Atrial fibrillation or irregular heartbeat Vascular: [ ]  Pain in legs with walking, [ ]  Pain in legs at rest, [ ]  Pain in legs at night,  [ ]  Non-healing ulcer, [ ]  Blood clot in vein/DVT,   Pulmonary: [ ]  Home oxygen, [ ]  Productive cough, [ ]  Coughing up blood, [ ]  Asthma,  [ ]  Wheezing Musculoskeletal:  [ ]  Arthritis, [ ]  Low back pain, [ ]  Joint pain Hematologic: [ ]  Easy Bruising, [ ]  Anemia; [ ]  Hepatitis Gastrointestinal: [ ]  Blood in stool, [ ]  Gastroesophageal Reflux/heartburn, [ ]  Trouble swallowing Urinary: [x ] chronic Kidney disease, [x ] on HD - [x ] MWF or [ ]  TTHS, [ ]  Burning with urination, [ ]  Difficulty urinating Skin: [ ]  Rashes, [ ]  Wounds Psychological: [ ]  Anxiety, [ ]  Depression   Social  History History  Substance Use Topics  . Smoking status: Former Smoker -- 0.1 packs/day    Types: Cigarettes    Quit date: 02/03/1978  . Smokeless tobacco: Never Used  . Alcohol Use: Yes    Family History Family History  Problem Relation Age of Onset  . Cancer Mother     COLON    No Known Allergies  Current Outpatient Prescriptions  Medication Sig Dispense Refill  . BIOTIN 5000 PO Take 1 tablet by mouth daily.      . clonazePAM (KLONOPIN) 0.5 MG tablet Take 0.5 mg by mouth. Monday, Wednesday,Friday      . diphenhydramine-acetaminophen (TYLENOL PM) 25-500 MG TABS Take 1 tablet by mouth at bedtime as needed.      . multivitamin (RENA-VIT) TABS tablet Take 1 tablet by mouth daily.      Marland Kitchen PROTEIN PO Take by mouth as needed. 2 teaspoons occasionally      . sevelamer (RENVELA) 800 MG tablet Take 800 mg by mouth 3 (three) times daily with meals.      Marland Kitchen amLODipine (NORVASC) 2.5 MG tablet Take 2.5 mg by mouth. Tuesday, Thursday, Saturday      . cinacalcet (SENSIPAR) 90 MG tablet Take 90 mg by mouth daily.      Marland Kitchen  Menthol, Topical Analgesic, (BIOFREEZE EX) Apply 1 application topically 2 (two) times daily as needed. For pain/stiffness        Physical Examination  Filed Vitals:   06/21/12 1509  BP: 113/58  Pulse: 93  Temp: 98.3 F (36.8 C)  Resp: 16    Body mass index is 35.23 kg/(m^2).  General:  WDWN in NAD Gait: Normal HENT: WNL Eyes: Pupils equal Pulmonary: normal non-labored breathing , without Rales, rhonchi,  wheezing Cardiac: RRR, without  Murmurs, rubs or gallops; Abdomen: soft, NT, no masses Skin: no rashes, ulcers noted Extremities without ischemic changes, no Gangrene , no cellulitis; LLE has approx 1x4cm area where the skin is thin without drainage in medial aspect of the left thigh gortex graft Musculoskeletal: no muscle wasting or atrophy  Neurologic: A&O X 3; Appropriate Affect ; SENSATION: normal; MOTOR FUNCTION:  moving all extremities equally. Speech is  fluent/normal   ASSESSMENT/Plan: Deteriorating area in medial aspect of left thigh graft without sign of infection.  This area is in danger of rupturing and bleeding and thus we will schedule the patient for replacement of medial aspect of left thigh AVGG on Thursday 06/23/12 Pt was seen and examined by Dr. Kellie Simmering and the pt agrees to proceed with surgery  Clinic MD: Victorino Dike, MD  Agree with above assessment. Patient will have medial aspect of AV graft replaced from the left thigh graft because of danger of rupture and bleeding.

## 2012-06-22 ENCOUNTER — Other Ambulatory Visit: Payer: Self-pay

## 2012-06-22 ENCOUNTER — Encounter (HOSPITAL_COMMUNITY): Payer: Self-pay | Admitting: *Deleted

## 2012-06-22 MED ORDER — CEFAZOLIN SODIUM-DEXTROSE 2-3 GM-% IV SOLR
2.0000 g | Freq: Once | INTRAVENOUS | Status: DC
  Filled 2012-06-22: qty 50

## 2012-06-22 MED ORDER — SODIUM CHLORIDE 0.9 % IV SOLN: INTRAVENOUS | Status: DC

## 2012-06-23 ENCOUNTER — Encounter (HOSPITAL_COMMUNITY): Payer: Self-pay | Admitting: Anesthesiology

## 2012-06-23 ENCOUNTER — Encounter (HOSPITAL_COMMUNITY)
Admission: RE | Disposition: A | Discharge status: Home / Self Care | Payer: Self-pay | Source: Ambulatory Visit | Attending: Vascular Surgery

## 2012-06-23 ENCOUNTER — Ambulatory Visit (HOSPITAL_COMMUNITY): Payer: Medicare Other | Admitting: Anesthesiology

## 2012-06-23 ENCOUNTER — Ambulatory Visit (HOSPITAL_COMMUNITY)
Admission: RE | Admit: 2012-06-23 | Discharge: 2012-06-23 | Disposition: A | Discharge status: Home / Self Care | Payer: Medicare Other | Source: Ambulatory Visit | Attending: Vascular Surgery | Admitting: Vascular Surgery

## 2012-06-23 ENCOUNTER — Ambulatory Visit (HOSPITAL_COMMUNITY): Payer: Medicare Other

## 2012-06-23 DIAGNOSIS — I724 Aneurysm of artery of lower extremity: Secondary | ICD-10-CM | POA: Insufficient documentation

## 2012-06-23 DIAGNOSIS — Z992 Dependence on renal dialysis: Secondary | ICD-10-CM | POA: Insufficient documentation

## 2012-06-23 DIAGNOSIS — Y832 Surgical operation with anastomosis, bypass or graft as the cause of abnormal reaction of the patient, or of later complication, without mention of misadventure at the time of the procedure: Secondary | ICD-10-CM | POA: Insufficient documentation

## 2012-06-23 DIAGNOSIS — T82898A Other specified complication of vascular prosthetic devices, implants and grafts, initial encounter: Secondary | ICD-10-CM

## 2012-06-23 DIAGNOSIS — I120 Hypertensive chronic kidney disease with stage 5 chronic kidney disease or end stage renal disease: Secondary | ICD-10-CM | POA: Insufficient documentation

## 2012-06-23 DIAGNOSIS — N186 End stage renal disease: Secondary | ICD-10-CM | POA: Insufficient documentation

## 2012-06-23 HISTORY — PX: REVISION OF ARTERIOVENOUS GORETEX GRAFT: SHX6073

## 2012-06-23 LAB — POCT I-STAT, CHEM 8
Calcium, Ion: 1.07 mmol/L — ABNORMAL LOW (ref 1.13–1.30)
Glucose, Bld: 114 mg/dL — ABNORMAL HIGH (ref 70–99)
HCT: 47 % — ABNORMAL HIGH (ref 36.0–46.0)
Hemoglobin: 16 g/dL — ABNORMAL HIGH (ref 12.0–15.0)
TCO2: 31 mmol/L (ref 0–100)

## 2012-06-23 SURGERY — REVISION OF ARTERIOVENOUS GORETEX GRAFT
Anesthesia: General | Site: Thigh | Laterality: Left | Wound class: Clean

## 2012-06-23 MED ORDER — OXYCODONE HCL 5 MG PO TABS: 5.0000 mg | ORAL_TABLET | ORAL | Status: DC | PRN

## 2012-06-23 MED ORDER — FENTANYL CITRATE 0.05 MG/ML IJ SOLN
INTRAMUSCULAR | Status: DC | PRN
  Administered 2012-06-23 (×3): 50 ug via INTRAVENOUS

## 2012-06-23 MED ORDER — ONDANSETRON HCL 4 MG/2ML IJ SOLN
INTRAMUSCULAR | Status: DC | PRN
  Administered 2012-06-23: 4 mg via INTRAVENOUS

## 2012-06-23 MED ORDER — SODIUM CHLORIDE 0.9 % IV SOLN
INTRAVENOUS | Status: DC | PRN
  Administered 2012-06-23 (×2): via INTRAVENOUS

## 2012-06-23 MED ORDER — SODIUM CHLORIDE 0.9 % IR SOLN
Status: DC | PRN
  Administered 2012-06-23: 09:00:00

## 2012-06-23 MED ORDER — MUPIROCIN 2 % EX OINT
TOPICAL_OINTMENT | Freq: Once | CUTANEOUS | Status: AC
  Administered 2012-06-23: 07:00:00 via NASAL

## 2012-06-23 MED ORDER — MUPIROCIN 2 % EX OINT
TOPICAL_OINTMENT | CUTANEOUS | Status: AC
  Filled 2012-06-23: qty 22

## 2012-06-23 MED ORDER — MIDAZOLAM HCL 5 MG/5ML IJ SOLN
INTRAMUSCULAR | Status: DC | PRN
  Administered 2012-06-23: 1 mg via INTRAVENOUS

## 2012-06-23 MED ORDER — HYDROMORPHONE HCL PF 1 MG/ML IJ SOLN
0.2500 mg | INTRAMUSCULAR | Status: DC | PRN
  Administered 2012-06-23 (×2): 0.25 mg via INTRAVENOUS

## 2012-06-23 MED ORDER — OXYCODONE HCL 5 MG PO TABS: 5.0000 mg | ORAL_TABLET | Freq: Once | ORAL | Status: DC | PRN

## 2012-06-23 MED ORDER — LIDOCAINE HCL (CARDIAC) 20 MG/ML IV SOLN
INTRAVENOUS | Status: DC | PRN
  Administered 2012-06-23: 60 mg via INTRAVENOUS

## 2012-06-23 MED ORDER — OXYCODONE HCL 5 MG/5ML PO SOLN: 5.0000 mg | Freq: Once | ORAL | Status: DC | PRN

## 2012-06-23 MED ORDER — SODIUM CHLORIDE 0.9 % IV SOLN
10.0000 mg | INTRAVENOUS | Status: DC | PRN
  Administered 2012-06-23: 10 ug/min via INTRAVENOUS

## 2012-06-23 MED ORDER — PROPOFOL 10 MG/ML IV BOLUS
INTRAVENOUS | Status: DC | PRN
  Administered 2012-06-23: 150 mg via INTRAVENOUS
  Administered 2012-06-23: 20 mg via INTRAVENOUS
  Administered 2012-06-23: 30 mg via INTRAVENOUS

## 2012-06-23 MED ORDER — 0.9 % SODIUM CHLORIDE (POUR BTL) OPTIME
TOPICAL | Status: DC | PRN
  Administered 2012-06-23: 1000 mL

## 2012-06-23 MED ORDER — METOCLOPRAMIDE HCL 5 MG/ML IJ SOLN: 10.0000 mg | Freq: Once | INTRAMUSCULAR | Status: DC | PRN

## 2012-06-23 MED ORDER — PHENYLEPHRINE HCL 10 MG/ML IJ SOLN
INTRAMUSCULAR | Status: DC | PRN
  Administered 2012-06-23 (×5): 80 ug via INTRAVENOUS

## 2012-06-23 SURGICAL SUPPLY — 42 items
ADH SKN CLS APL DERMABOND .7 (GAUZE/BANDAGES/DRESSINGS) ×2
CANISTER SUCTION 2500CC (MISCELLANEOUS) ×2 IMPLANT
CLIP TI MEDIUM 6 (CLIP) ×2 IMPLANT
CLIP TI WIDE RED SMALL 6 (CLIP) ×2 IMPLANT
CLOTH BEACON ORANGE TIMEOUT ST (SAFETY) ×2 IMPLANT
COVER SURGICAL LIGHT HANDLE (MISCELLANEOUS) ×2 IMPLANT
DECANTER SPIKE VIAL GLASS SM (MISCELLANEOUS) ×2 IMPLANT
DERMABOND ADVANCED (GAUZE/BANDAGES/DRESSINGS) ×2
DERMABOND ADVANCED .7 DNX12 (GAUZE/BANDAGES/DRESSINGS) ×1 IMPLANT
ELECT REM PT RETURN 9FT ADLT (ELECTROSURGICAL) ×2
ELECTRODE REM PT RTRN 9FT ADLT (ELECTROSURGICAL) ×1 IMPLANT
GEL ULTRASOUND 20GR AQUASONIC (MISCELLANEOUS) IMPLANT
GLOVE BIO SURGEON STRL SZ 6.5 (GLOVE) ×2 IMPLANT
GLOVE BIOGEL PI IND STRL 6.5 (GLOVE) IMPLANT
GLOVE BIOGEL PI IND STRL 7.0 (GLOVE) IMPLANT
GLOVE BIOGEL PI IND STRL 7.5 (GLOVE) IMPLANT
GLOVE BIOGEL PI INDICATOR 6.5 (GLOVE) ×1
GLOVE BIOGEL PI INDICATOR 7.0 (GLOVE) ×2
GLOVE BIOGEL PI INDICATOR 7.5 (GLOVE) ×1
GLOVE ECLIPSE 6.5 STRL STRAW (GLOVE) ×1 IMPLANT
GLOVE SS BIOGEL STRL SZ 7 (GLOVE) ×1 IMPLANT
GLOVE SUPERSENSE BIOGEL SZ 7 (GLOVE) ×1
GOWN STRL NON-REIN LRG LVL3 (GOWN DISPOSABLE) ×4 IMPLANT
GRAFT GORETEX STND 6X20 (Vascular Products) ×2 IMPLANT
GRAFT GORETEXSTD 6X20 (Vascular Products) IMPLANT
KIT BASIN OR (CUSTOM PROCEDURE TRAY) ×2 IMPLANT
KIT ROOM TURNOVER OR (KITS) ×2 IMPLANT
NS IRRIG 1000ML POUR BTL (IV SOLUTION) ×2 IMPLANT
PACK CV ACCESS (CUSTOM PROCEDURE TRAY) ×2 IMPLANT
PAD ARMBOARD 7.5X6 YLW CONV (MISCELLANEOUS) ×4 IMPLANT
SPONGE GAUZE 4X4 12PLY (GAUZE/BANDAGES/DRESSINGS) ×2 IMPLANT
SUT ETHILON 3 0 PS 1 (SUTURE) ×1 IMPLANT
SUT PROLENE 6 0 BV (SUTURE) ×4 IMPLANT
SUT PROLENE 6 0 CC (SUTURE) ×2 IMPLANT
SUT VIC AB 2-0 CT1 27 (SUTURE) ×2
SUT VIC AB 2-0 CT1 TAPERPNT 27 (SUTURE) IMPLANT
SUT VIC AB 3-0 SH 27 (SUTURE) ×10
SUT VIC AB 3-0 SH 27X BRD (SUTURE) ×2 IMPLANT
TOWEL OR 17X24 6PK STRL BLUE (TOWEL DISPOSABLE) ×2 IMPLANT
TOWEL OR 17X26 10 PK STRL BLUE (TOWEL DISPOSABLE) ×2 IMPLANT
UNDERPAD 30X30 INCONTINENT (UNDERPADS AND DIAPERS) ×2 IMPLANT
WATER STERILE IRR 1000ML POUR (IV SOLUTION) ×2 IMPLANT

## 2012-06-23 NOTE — Consult Note (Signed)
  Plan to replace segment of AVG L thightoday. Pt understands and agrees to proceed.

## 2012-06-23 NOTE — Anesthesia Preprocedure Evaluation (Addendum)
Anesthesia Evaluation  Patient identified by MRN, date of birth, ID band Patient awake    Reviewed: Allergy & Precautions, H&P , NPO status , Patient's Chart, lab work & pertinent test results, reviewed documented beta blocker date and time   Airway Mallampati: II TM Distance: >3 FB Neck ROM: full  Mouth opening: Limited Mouth Opening  Dental  (+) Missing   Pulmonary neg pulmonary ROS,  breath sounds clear to auscultation        Cardiovascular hypertension, Pt. on medications Rhythm:regular     Neuro/Psych negative neurological ROS  negative psych ROS   GI/Hepatic negative GI ROS, Neg liver ROS,   Endo/Other  negative endocrine ROS  Renal/GU ESRF and DialysisRenal diseaseHemodialysis X 11 yrs; M-W-F incomplete cycle yesterday.  negative genitourinary   Musculoskeletal   Abdominal   Peds  Hematology negative hematology ROS (+)   Anesthesia Other Findings See surgeon's H&P   Reproductive/Obstetrics negative OB ROS                         Anesthesia Physical Anesthesia Plan  ASA: III  Anesthesia Plan: General and MAC   Post-op Pain Management:    Induction: Intravenous  Airway Management Planned: Simple Face Mask and LMA  Additional Equipment:   Intra-op Plan:   Post-operative Plan: Extubation in OR  Informed Consent: I have reviewed the patients History and Physical, chart, labs and discussed the procedure including the risks, benefits and alternatives for the proposed anesthesia with the patient or authorized representative who has indicated his/her understanding and acceptance.   Dental Advisory Given  Plan Discussed with: CRNA and Surgeon  Anesthesia Plan Comments:         Anesthesia Quick Evaluation

## 2012-06-23 NOTE — Transfer of Care (Signed)
Immediate Anesthesia Transfer of Care Note  Patient: Sue Neal  Procedure(s) Performed: Procedure(s) (LRB) with comments: REVISION OF ARTERIOVENOUS GORETEX GRAFT (Left)  Patient Location: PACU  Anesthesia Type: General  Level of Consciousness: awake, alert  and patient cooperative  Airway & Oxygen Therapy: Patient Spontanous Breathing and Patient connected to nasal cannula oxygen  Post-op Assessment: Report given to PACU RN, Post -op Vital signs reviewed and stable and Patient moving all extremities  Post vital signs: Reviewed and stable  Complications: No apparent anesthesia complications

## 2012-06-23 NOTE — Anesthesia Postprocedure Evaluation (Signed)
Anesthesia Post Note  Patient: Sue Neal  Procedure(s) Performed: Procedure(s) (LRB): REVISION OF ARTERIOVENOUS GORETEX GRAFT (Left)  Anesthesia type: general  Patient location: PACU  Post pain: Pain level controlled  Post assessment: Patient's Cardiovascular Status Stable  Last Vitals:  Filed Vitals:   06/23/12 1123  BP:   Pulse:   Temp: 36.2 C  Resp:     Post vital signs: Reviewed and stable  Level of consciousness: sedated  Complications: No apparent anesthesia complications

## 2012-06-23 NOTE — Op Note (Signed)
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 The left leg was prepped with Betadine scrub and solution draped in routine sterile manner. There was a functioning AV graft in the left thigh which had a pseudoaneurysm located at the 8:00 position which had bled on a few occasions. Adhesive drape was placed over the skin to isolate this. Short incision was made at the 6:00 and 11:00 position in the graft dissected free. It was an old graft which did have some calcification. A new tunnel was created peripheral to the old graft and a new piece of 6 mm Gore-Tex graft delivered through the new tunnel. No heparin was utilized. Graft was occluded both the arterial and venous ends in the graft transected with some difficulty because of the calcification. Part of the calcium was at in the intima of the graft which had to be removed. Following this heparin saline was flushed proximally and distally within the remaining graft. Was excellent inflow present. The graft was anastomosed end and the old graft with 6-0 Prolene at both the arterial and venous ends. A 5 dilator wouldn't traverse the venous anastomosis. Following completion of this the clamps were released and there was a good pulse and thrill in the graft Doppler flow. Adequate hemostasis was achieved and the wounds were closed in layers with Vicryl in a subcuticular fashion with  Dermabond. OpSite were placed over this in the area of the pseudoaneurysm with the abrasion was then uncovered and this area was excised completely with an elliptical incision completely removing the eroded graft. There is no evidence of active infection or inflammation. Graft was completely removed within this area and after hemostasis was achieved this wound was closed in layers with Vicryl in a subcuticular fashion. Sterile dressing applied patient taken recovery room in stable condition   Tinnie Gens, MD 06/23/2012 9:57 AM

## 2012-06-23 NOTE — Progress Notes (Signed)
Dialysis access report faxed to [facility] [place]

## 2012-06-23 NOTE — Preoperative (Signed)
Beta Blockers   Reason not to administer Beta Blockers:Not Applicable 

## 2012-06-24 MED FILL — Mupirocin Oint 2%: CUTANEOUS | Qty: 22 | Status: AC

## 2012-07-05 ENCOUNTER — Telehealth: Payer: Self-pay | Admitting: Vascular Surgery

## 2012-07-05 NOTE — Telephone Encounter (Signed)
Message copied by Gena Fray on Tue Jul 05, 2012  2:36 PM ------      Message from: Alfonso Patten      Created: Mon Jul 04, 2012  4:56 PM      Regarding: RE: follow up       USUALLY IT IS ON AN AS NEEDED BASIS WITH REVISION OF GRAFT      ----- Message -----         From: Gena Fray         Sent: 07/04/2012   3:43 PM           To: Alfonso Patten, RN      Subject: follow up                                                Carmelina Paddock Emmie Niemann called to see if pt has followed up from 06/23/12 surgery. I did not see any d/c instructions and a staff message was not received. Does she need a follow up?            Thanks,       Hinton Dyer

## 2012-07-05 NOTE — Telephone Encounter (Signed)
Spoke with Otila Kluver @ [facility] [place] to notify that we do not need to f.u unless pt is having issues.

## 2012-07-18 ENCOUNTER — Other Ambulatory Visit: Payer: Self-pay

## 2012-07-18 DIAGNOSIS — I739 Peripheral vascular disease, unspecified: Secondary | ICD-10-CM

## 2012-07-19 ENCOUNTER — Ambulatory Visit (INDEPENDENT_AMBULATORY_CARE_PROVIDER_SITE_OTHER): Payer: Medicare Other | Admitting: Vascular Surgery

## 2012-07-19 ENCOUNTER — Encounter: Payer: Self-pay | Admitting: Vascular Surgery

## 2012-07-19 ENCOUNTER — Encounter (INDEPENDENT_AMBULATORY_CARE_PROVIDER_SITE_OTHER): Payer: Medicare Other

## 2012-07-19 VITALS — BP 100/48 | HR 76 | Resp 16 | Ht 62.0 in | Wt 191.0 lb

## 2012-07-19 DIAGNOSIS — M79609 Pain in unspecified limb: Secondary | ICD-10-CM

## 2012-07-19 DIAGNOSIS — R200 Anesthesia of skin: Secondary | ICD-10-CM | POA: Insufficient documentation

## 2012-07-19 DIAGNOSIS — R209 Unspecified disturbances of skin sensation: Secondary | ICD-10-CM

## 2012-07-19 NOTE — Progress Notes (Signed)
Subjective:     Patient ID: Sue Neal, female   DOB: Feb 12, 1946, 66 y.o.   MRN: [id number]  HPI this 66 year old female is referred by Dr. Lorrene Reid for numbness in the left hand to rule out vascular problems. Patient states over the last 3-4 weeks she develops numbness primarily in the left third finger periods usually occurs at night. She has been "wrapping" the hand with some relief. Other fingers seem to be uninvolved. When she awakens in the morning she has significant numbness which resolves when she gets out of bed. She has no symptoms in the right hand. She has nonfunctional AV grafts in both upper extremities.  Past Medical History  Diagnosis Date  . Anemia   . Hypertension   . Hyperparathyroidism, secondary   . Back pain, chronic   . Uterine fibroid   . Chronic kidney disease     History  Substance Use Topics  . Smoking status: Former Smoker -- 0.1 packs/day    Types: Cigarettes    Quit date: 02/03/1978  . Smokeless tobacco: Never Used  . Alcohol Use: No    Family History  Problem Relation Age of Onset  . Cancer Mother     COLON    No Known Allergies  Current outpatient prescriptions:amLODipine (NORVASC) 2.5 MG tablet, Take 2.5 mg by mouth. Tuesday, Thursday, Saturday, Disp: , Rfl: ;  BIOTIN 5000 PO, Take 1 tablet by mouth daily., Disp: , Rfl: ;  cinacalcet (SENSIPAR) 90 MG tablet, Take 90 mg by mouth daily., Disp: , Rfl: ;  clonazePAM (KLONOPIN) 0.5 MG tablet, Take 0.5 mg by mouth. Monday, Wednesday,Friday, Disp: , Rfl:  diphenhydramine-acetaminophen (TYLENOL PM) 25-500 MG TABS, Take 1 tablet by mouth at bedtime as needed., Disp: , Rfl: ;  Menthol, Topical Analgesic, (BIOFREEZE EX), Apply 1 application topically 2 (two) times daily as needed. For pain/stiffness, Disp: , Rfl: ;  multivitamin (RENA-VIT) TABS tablet, Take 1 tablet by mouth daily., Disp: , Rfl:  oxyCODONE (ROXICODONE) 5 MG immediate release tablet, Take 1-2 tablets (5-10 mg total) by mouth every 4 (four)  hours as needed for pain., Disp: 30 tablet, Rfl: 0;  PROTEIN PO, Take by mouth as needed. 2 teaspoons occasionally, Disp: , Rfl: ;  sevelamer (RENVELA) 800 MG tablet, Take 800 mg by mouth 3 (three) times daily with meals., Disp: , Rfl:   BP 100/48  Pulse 76  Resp 16  Ht 5\' 2"  (1.575 m)  Wt 191 lb (86.637 kg)  BMI 34.93 kg/m2  LMP 02/16/2012  Body mass index is 34.93 kg/(m^2).         Review of Systems recently revised AV graft left thigh is functioning well     Objective:   Physical ExamBP 100/48  Pulse 76  Resp 16  Ht 5\' 2"  (1.575 m)  Wt 191 lb (86.637 kg)  BMI 34.93 kg/m2  LMP 02/16/2012  Gen. chronically ill-appearing female in no apparent distress alert and oriented x3 Lungs no rhonchi or wheezing Left upper extremity with 2+ brachial pulse palpable. There is some calcification in the radial and ulnar arteries to palpation with 1+ radial pulse palpable. Both hands  warm and adequately perfused. Excellent strength and range of motion in both hands  Today I ordered upper extremity arterial study in both upper extremities. There is excellent triphasic flow in the radial and ulnar artery bilaterally with equal blood pressures of greater than [id number] systolic. Waveforms in the digits are also brisk with left third symptomatic digit being the same  as the other digits    Assessment:     No evidence of arterial insufficiency left upper extremity to explain numbness left third digit-suspect nerve compression etiology    Plan:     If numbness continues may have to see hand surgeon to see if patient has nerve compression syndrome-no evidence of arterial insufficiency

## 2012-08-29 DIAGNOSIS — G5600 Carpal tunnel syndrome, unspecified upper limb: Secondary | ICD-10-CM | POA: Insufficient documentation

## 2013-03-03 ENCOUNTER — Encounter (HOSPITAL_BASED_OUTPATIENT_CLINIC_OR_DEPARTMENT_OTHER): Payer: Self-pay | Admitting: *Deleted

## 2013-03-06 ENCOUNTER — Other Ambulatory Visit: Payer: Self-pay | Admitting: Orthopedic Surgery

## 2013-03-07 ENCOUNTER — Encounter (HOSPITAL_BASED_OUTPATIENT_CLINIC_OR_DEPARTMENT_OTHER): Payer: Self-pay | Admitting: *Deleted

## 2013-03-07 NOTE — Progress Notes (Signed)
Off htn meds-istat dos-dialysis pt

## 2013-03-10 ENCOUNTER — Ambulatory Visit (HOSPITAL_BASED_OUTPATIENT_CLINIC_OR_DEPARTMENT_OTHER)
Admission: RE | Admit: 2013-03-10 | Discharge: 2013-03-10 | Disposition: A | Discharge status: Home / Self Care | Payer: Medicare Other | Source: Ambulatory Visit | Attending: Orthopedic Surgery | Admitting: Orthopedic Surgery

## 2013-03-10 ENCOUNTER — Encounter (HOSPITAL_BASED_OUTPATIENT_CLINIC_OR_DEPARTMENT_OTHER): Payer: Self-pay | Admitting: Orthopedic Surgery

## 2013-03-10 ENCOUNTER — Encounter (HOSPITAL_BASED_OUTPATIENT_CLINIC_OR_DEPARTMENT_OTHER)
Admission: RE | Disposition: A | Discharge status: Home / Self Care | Payer: Self-pay | Source: Ambulatory Visit | Attending: Orthopedic Surgery

## 2013-03-10 ENCOUNTER — Ambulatory Visit (HOSPITAL_BASED_OUTPATIENT_CLINIC_OR_DEPARTMENT_OTHER): Payer: Medicare Other | Admitting: Anesthesiology

## 2013-03-10 ENCOUNTER — Encounter (HOSPITAL_BASED_OUTPATIENT_CLINIC_OR_DEPARTMENT_OTHER): Payer: Self-pay | Admitting: Anesthesiology

## 2013-03-10 DIAGNOSIS — Z87891 Personal history of nicotine dependence: Secondary | ICD-10-CM | POA: Insufficient documentation

## 2013-03-10 DIAGNOSIS — Z992 Dependence on renal dialysis: Secondary | ICD-10-CM | POA: Insufficient documentation

## 2013-03-10 DIAGNOSIS — N186 End stage renal disease: Secondary | ICD-10-CM | POA: Insufficient documentation

## 2013-03-10 DIAGNOSIS — I120 Hypertensive chronic kidney disease with stage 5 chronic kidney disease or end stage renal disease: Secondary | ICD-10-CM | POA: Insufficient documentation

## 2013-03-10 DIAGNOSIS — G5600 Carpal tunnel syndrome, unspecified upper limb: Secondary | ICD-10-CM | POA: Insufficient documentation

## 2013-03-10 DIAGNOSIS — N2581 Secondary hyperparathyroidism of renal origin: Secondary | ICD-10-CM | POA: Insufficient documentation

## 2013-03-10 DIAGNOSIS — D649 Anemia, unspecified: Secondary | ICD-10-CM | POA: Insufficient documentation

## 2013-03-10 HISTORY — DX: Dependence on renal dialysis: Z99.2

## 2013-03-10 HISTORY — PX: CARPAL TUNNEL RELEASE: SHX101

## 2013-03-10 LAB — POCT I-STAT, CHEM 8
Calcium, Ion: 1.14 mmol/L (ref 1.13–1.30)
Chloride: 104 mEq/L (ref 96–112)
Glucose, Bld: 81 mg/dL (ref 70–99)
HCT: 39 % (ref 36.0–46.0)
Hemoglobin: 13.3 g/dL (ref 12.0–15.0)
TCO2: 28 mmol/L (ref 0–100)

## 2013-03-10 SURGERY — CARPAL TUNNEL RELEASE
Anesthesia: General | Site: Wrist | Laterality: Left | Wound class: Clean

## 2013-03-10 MED ORDER — FENTANYL CITRATE 0.05 MG/ML IJ SOLN: 50.0000 ug | INTRAMUSCULAR | Status: DC | PRN

## 2013-03-10 MED ORDER — PHENYLEPHRINE HCL 10 MG/ML IJ SOLN
INTRAMUSCULAR | Status: DC | PRN
  Administered 2013-03-10: 80 ug via INTRAVENOUS

## 2013-03-10 MED ORDER — LACTATED RINGERS IV SOLN: INTRAVENOUS | Status: DC

## 2013-03-10 MED ORDER — FENTANYL CITRATE 0.05 MG/ML IJ SOLN
INTRAMUSCULAR | Status: DC | PRN
  Administered 2013-03-10: 25 ug via INTRAVENOUS
  Administered 2013-03-10: 50 ug via INTRAVENOUS

## 2013-03-10 MED ORDER — ONDANSETRON HCL 4 MG/2ML IJ SOLN: 4.0000 mg | Freq: Once | INTRAMUSCULAR | Status: DC | PRN

## 2013-03-10 MED ORDER — HYDROMORPHONE HCL PF 1 MG/ML IJ SOLN: 0.2500 mg | INTRAMUSCULAR | Status: DC | PRN

## 2013-03-10 MED ORDER — PROPOFOL 10 MG/ML IV BOLUS
INTRAVENOUS | Status: DC | PRN
  Administered 2013-03-10: 150 mg via INTRAVENOUS

## 2013-03-10 MED ORDER — CHLORHEXIDINE GLUCONATE 4 % EX LIQD: 60.0000 mL | Freq: Once | CUTANEOUS | Status: DC

## 2013-03-10 MED ORDER — LIDOCAINE HCL (CARDIAC) 20 MG/ML IV SOLN
INTRAVENOUS | Status: DC | PRN
  Administered 2013-03-10: 80 mg via INTRAVENOUS

## 2013-03-10 MED ORDER — OXYCODONE HCL 5 MG/5ML PO SOLN: 5.0000 mg | Freq: Once | ORAL | Status: DC | PRN

## 2013-03-10 MED ORDER — SODIUM CHLORIDE 0.9 % IV SOLN
INTRAVENOUS | Status: DC
  Administered 2013-03-10: 12:00:00 via INTRAVENOUS

## 2013-03-10 MED ORDER — BUPIVACAINE HCL (PF) 0.25 % IJ SOLN
INTRAMUSCULAR | Status: DC | PRN
  Administered 2013-03-10: 7 mL

## 2013-03-10 MED ORDER — ONDANSETRON HCL 4 MG/2ML IJ SOLN
INTRAMUSCULAR | Status: DC | PRN
  Administered 2013-03-10: 4 mg via INTRAVENOUS

## 2013-03-10 MED ORDER — CEFAZOLIN SODIUM-DEXTROSE 2-3 GM-% IV SOLR
INTRAVENOUS | Status: DC | PRN
  Administered 2013-03-10: 2 g via INTRAVENOUS

## 2013-03-10 MED ORDER — HYDROCODONE-ACETAMINOPHEN 5-325 MG PO TABS: 1.0000 | ORAL_TABLET | Freq: Four times a day (QID) | ORAL | Status: DC | PRN

## 2013-03-10 MED ORDER — OXYCODONE HCL 5 MG PO TABS: 5.0000 mg | ORAL_TABLET | Freq: Once | ORAL | Status: DC | PRN

## 2013-03-10 MED ORDER — MIDAZOLAM HCL 2 MG/2ML IJ SOLN: 1.0000 mg | INTRAMUSCULAR | Status: DC | PRN

## 2013-03-10 SURGICAL SUPPLY — 37 items
BANDAGE GAUZE ELAST BULKY 4 IN (GAUZE/BANDAGES/DRESSINGS) ×2 IMPLANT
BLADE SURG 15 STRL LF DISP TIS (BLADE) ×1 IMPLANT
BLADE SURG 15 STRL SS (BLADE) ×2
BNDG CMPR 9X4 STRL LF SNTH (GAUZE/BANDAGES/DRESSINGS) ×1
BNDG COHESIVE 3X5 TAN STRL LF (GAUZE/BANDAGES/DRESSINGS) ×2 IMPLANT
BNDG ESMARK 4X9 LF (GAUZE/BANDAGES/DRESSINGS) ×1 IMPLANT
CHLORAPREP W/TINT 26ML (MISCELLANEOUS) ×2 IMPLANT
CLOTH BEACON ORANGE TIMEOUT ST (SAFETY) ×2 IMPLANT
CORDS BIPOLAR (ELECTRODE) ×2 IMPLANT
COVER MAYO STAND STRL (DRAPES) ×2 IMPLANT
COVER TABLE BACK 60X90 (DRAPES) ×2 IMPLANT
CUFF TOURNIQUET SINGLE 18IN (TOURNIQUET CUFF) ×2 IMPLANT
DRAPE EXTREMITY T 121X128X90 (DRAPE) ×2 IMPLANT
DRAPE SURG 17X23 STRL (DRAPES) ×2 IMPLANT
DRSG KUZMA FLUFF (GAUZE/BANDAGES/DRESSINGS) ×2 IMPLANT
GAUZE XEROFORM 1X8 LF (GAUZE/BANDAGES/DRESSINGS) ×2 IMPLANT
GLOVE BIO SURGEON STRL SZ 6.5 (GLOVE) ×3 IMPLANT
GLOVE BIOGEL PI IND STRL 8.5 (GLOVE) ×1 IMPLANT
GLOVE BIOGEL PI INDICATOR 8.5 (GLOVE) ×1
GLOVE SURG ORTHO 8.0 STRL STRW (GLOVE) ×2 IMPLANT
GOWN BRE IMP PREV XXLGXLNG (GOWN DISPOSABLE) ×2 IMPLANT
GOWN PREVENTION PLUS XLARGE (GOWN DISPOSABLE) ×2 IMPLANT
NEEDLE 27GAX1X1/2 (NEEDLE) ×1 IMPLANT
NS IRRIG 1000ML POUR BTL (IV SOLUTION) ×2 IMPLANT
PACK BASIN DAY SURGERY FS (CUSTOM PROCEDURE TRAY) ×2 IMPLANT
PAD CAST 3X4 CTTN HI CHSV (CAST SUPPLIES) ×1 IMPLANT
PADDING CAST ABS 4INX4YD NS (CAST SUPPLIES) ×1
PADDING CAST ABS COTTON 4X4 ST (CAST SUPPLIES) ×1 IMPLANT
PADDING CAST COTTON 3X4 STRL (CAST SUPPLIES) ×2
SPONGE GAUZE 4X4 12PLY (GAUZE/BANDAGES/DRESSINGS) ×2 IMPLANT
STOCKINETTE 4X48 STRL (DRAPES) ×2 IMPLANT
SUT VICRYL 4-0 PS2 18IN ABS (SUTURE) IMPLANT
SUT VICRYL RAPIDE 4/0 PS 2 (SUTURE) ×2 IMPLANT
SYR BULB 3OZ (MISCELLANEOUS) ×2 IMPLANT
SYR CONTROL 10ML LL (SYRINGE) ×1 IMPLANT
TOWEL OR 17X24 6PK STRL BLUE (TOWEL DISPOSABLE) ×2 IMPLANT
UNDERPAD 30X30 INCONTINENT (UNDERPADS AND DIAPERS) ×2 IMPLANT

## 2013-03-10 NOTE — H&P (Signed)
Sue Neal is a 67 year old left hand dominant female with numbness and tingling in her left hand. There is no history of injury to the hand or neck. This involves the index, middle, and ring fingers. This has been going on for months and it is to the point now that it is relatively constant. She has a history of polycystic disease. She is on dialysis Monday, Wednesday, and Friday. She has occasional numbness and tingling awakening her at night. She has no history of diabetes or thyroid problems. She does have arthritis. She has no history of gout. There is a family history of diabetes. She is not a very good historian. It is difficult to determine exactly what length of time this has been going on. She is complaining of constant, mild, dull and aching type pain with constant numbness. It has not significantly changed. Wrapping has helped along with heat. She states apparently she has had vascular tests done revealing no vascular insufficiencies. Her shunt is in the left side.   PAST MEDICAL HISTORY: She has no known drug allergies. She is on Amlodipine, Sensipar, Clonazepam, RENAVITE, Oxycodone, protein, and extra strength Tylenol. She had the graft done in September.   FAMILY H ISTORY: Positive for diabetes, high BP, and arthritis.  SOCIAL HISTORY: She does not smoke or drink. She is single and retired.  REVIEW OF SYSTEMS: Positive for glasses, kidney disease, otherwise negative for 14 points. Sue Neal is an 67 y.o. female.   Chief Complaint: cts left HPI: see above  Past Medical History  Diagnosis Date  . Anemia   . Hyperparathyroidism, secondary   . Back pain, chronic   . Uterine fibroid   . Chronic kidney disease   . 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 Hypertension     off meds now    Past Surgical History  Procedure Laterality Date  . Multiple failed grafts      left thigh AVG 12/03/00, clotted  -05/31/03, 01/24/04, 08/28/04, 09/06/04( thrombectomy and revision )Left AVG declot procedure including complete AV shuntogram, 08/29/04 left AV thrombolysis and angioplasty 2012 shunto gram to left thigh AVG  . Hernia repair      laparoscopic repair during a [place] hospitalization from 03/26/2006-03/30/2006  . Hemicolectomy  02/1996  . Arteriovenous graft placement Right 03/19/2000    upper arm  . Removal of a dialysis catheter  05/31/2000    Schon catheter  . Thrombectomy and revision of arterioventous (av) goretex  graft Right 06/04/2000    upper arm  . Insertion of dialysis catheter Left 06/06/2000    IJ Quinton catheter  . Insertion of dialysis catheter Right 06/28/2000    IJ Ash catheter  . Insertion of dialysis catheter Left 08/13/2000    subclavian Ash catheter  . Arteriovenous graft placement Left 12/03/2000    thigh  . Thrombectomy and revision of arterioventous (av) goretex  graft Left 09/06/2004    thigh  . Incisional hernia repair  04/15/2006    laparoscopic  . Shuntogram Left 02/16/2012    thigh; with angioplasty, venous anastomosis; stent, medial graft pseudoaneurysm  . Revision of arteriovenous goretex graft Left 06/23/2012    thigh; with exc. pseudoaneurysm  . Colonoscopy w/ biopsies      Family History  Problem Relation Age of Onset  . Cancer Mother     COLON   Social History:  reports that she quit smoking about  35 years ago. Her smoking use included Cigarettes. She smoked 0.10 packs per day. She has never used smokeless tobacco. She reports that she does not drink alcohol or use illicit drugs.  Allergies: No Known Allergies  No prescriptions prior to admission    No results found for this or any previous visit (from the past 48 hour(s)).  No results found.   Pertinent items are noted in HPI.  Height 5\' 2"  (1.575 m), weight 88.451 kg (195 lb), last menstrual period 02/16/2012.  General appearance: alert, cooperative and appears stated age Head: Normocephalic,  without obvious abnormality Neck: no adenopathy and no JVD Resp: clear to auscultation bilaterally Cardio: regular rate and rhythm, S1, S2 normal, no murmur, click, rub or gallop GI: soft, non-tender; bowel sounds normal; no masses,  no organomegaly Extremities: extremities normal, atraumatic, no cyanosis or edema Pulses: 2+ and symmetric Skin: Skin color, texture, turgor normal. No rashes or lesions Neurologic: Grossly normal Incision/Wound: na  Assessment/Plan CTS left  She has elected to proceed to have this surgically released.  The pre, peri and post op course are discussed along with risks and complications.  She is aware there is no guarantee with surgery, possibility of infection, recurrence, injury to arteries, nerves and tendons, incomplete relief of symptoms and dystrophy.  This will be scheduled as an outpatient under regional anesthesia for left carpal tunnel release.  Ceria Suminski R 03/10/2013, 9:44 AM

## 2013-03-10 NOTE — Transfer of Care (Signed)
Immediate Anesthesia Transfer of Care Note  Patient: Sue Neal  Procedure(s) Performed: Procedure(s): CARPAL TUNNEL RELEASE (Left)  Patient Location: PACU  Anesthesia Type:General  Level of Consciousness: awake, sedated and patient cooperative  Airway & Oxygen Therapy: Patient Spontanous Breathing and Patient connected to face mask oxygen  Post-op Assessment: Report given to PACU RN and Post -op Vital signs reviewed and stable  Post vital signs: Reviewed and stable  Complications: No apparent anesthesia complications

## 2013-03-10 NOTE — Brief Op Note (Signed)
03/10/2013  1:16 PM  PATIENT:  Sue Neal  67 y.o. female  PRE-OPERATIVE DIAGNOSIS:  LEFT CARPAL TUNNEL SYNDROME  POST-OPERATIVE DIAGNOSIS:  left carpal tunnel syndrome  PROCEDURE:  Procedure(s): CARPAL TUNNEL RELEASE (Left)  SURGEON:  Surgeon(s) and Role:    * Wynonia Sours, MD - Primary  PHYSICIAN ASSISTANT:   ASSISTANTS: none   ANESTHESIA:   general and loca  EBL:     BLOOD ADMINISTERED:none  DRAINS: none   LOCAL MEDICATIONS USED:  MARCAINE     SPECIMEN:  No Specimen  DISPOSITION OF SPECIMEN:  N/A  COUNTS:  YES  TOURNIQUET:   Total Tourniquet Time Documented: Forearm (Left) - 12 minutes Total: Forearm (Left) - 12 minutes   DICTATION: .Other Dictation: Dictation Number [id number]  PLAN OF CARE: Discharge to home after PACU  PATIENT DISPOSITION:  PACU - hemodynamically stable.

## 2013-03-10 NOTE — Op Note (Signed)
NAMEIVRY, DANESE                 ACCOUNT NO.:  [id number]  MEDICAL RECORD NO.:  [id number]  LOCATION:                                 FACILITY:  PHYSICIAN:  Daryll Brod, M.D.            DATE OF BIRTH:  DATE OF PROCEDURE:  03/10/2013 DATE OF DISCHARGE:                              OPERATIVE REPORT   PREOPERATIVE DIAGNOSIS:  Carpal tunnel syndrome, left hand.  POSTOPERATIVE DIAGNOSIS:  Carpal tunnel syndrome, left hand.  OPERATION:  Decompression, left median nerve.  SURGEON:  Daryll Brod, MD  ANESTHESIA:  General with local infiltration.  ANESTHESIOLOGIST:  Lorrene Reid, M.D.  HISTORY:  The patient is a 67 year old female, on dialysis, with numbness and tingling, positive nerve conductions on her left hand.  She has elected to undergo surgical decompression to the median nerve.  Pre, peri, and postoperative course have been discussed along with risks and complications.  She is aware that there is no guarantee with the surgery, possibility of infection, recurrence of injury to arteries, nerves, tendons, incomplete relief of symptoms, dystrophy.  In the preoperative area, the patient was seen, the extremity marked by both the patient and surgeon.  Antibiotic given.  DESCRIPTION OF PROCEDURE:  The patient was brought to the operating room where a general anesthetic was carried out without difficulty.  She was prepped using ChloraPrep in the supine position with the left arm free. A 3-minute dry time was allowed.  Time-out taken, confirming the patient and procedure.  The limb was exsanguinated with an Esmarch bandage. Tourniquet was placed on forearm, was inflated to 220 mmHg.  A longitudinal incision was made in the left palm, carried down through subcutaneous tissue.  Bleeders were electrocauterized with bipolar. Palmar fascia was split.  Superficial palmar arch identified.  The flexor tendon to the ring finger identified to the ulnar side of the median nerve.  The carpal  retinaculum was incised with sharp dissection. Right angle and Sewall retractor were placed between skin and forearm fascia.  The fascia was released for approximately a centimeter and half proximal to the wrist crease under direct vision.  Canal was explored. Area of compression to the nerve with a very significant area hyperemia was noted.  No further lesions were identified.  The wound was irrigated and the skin was closed with interrupted 4-0 Vicryl Rapide sutures. Local infiltration with 0.25% Marcaine without epinephrine was given, 7 mL was used.  A sterile compressive dressing was applied.  On deflation of the tourniquet, all fingers immediately pinked.  She was taken to the recovery room for observation in satisfactory condition.  Fingers were free to allow mobility.  She will be discharged home to return in 1 week on Vicodin.          ______________________________ Daryll Brod, M.D.     GK/MEDQ  D:  03/10/2013  T:  03/10/2013  Job:  [id number]

## 2013-03-10 NOTE — Progress Notes (Signed)
Rx called to Pharmacy for patient. Norco 5/325mg , sig: take one by mouth every 6 hours as needed for pain, disp# 30, 0 refills, Dr. Daryll Brod. Called to [employer] [employer]. Paper Rx destroyed and witnessed by Carola Frost, RN.

## 2013-03-10 NOTE — Op Note (Signed)
Dictation Number [id number]

## 2013-03-10 NOTE — Anesthesia Postprocedure Evaluation (Signed)
  Anesthesia Post-op Note  Patient: Sue Neal  Procedure(s) Performed: Procedure(s): CARPAL TUNNEL RELEASE (Left)  Patient Location: PACU  Anesthesia Type:General  Level of Consciousness: awake, alert  and oriented  Airway and Oxygen Therapy: Patient Spontanous Breathing  Post-op Pain: mild  Post-op Assessment: Post-op Vital signs reviewed  Post-op Vital Signs: Reviewed  Complications: No apparent anesthesia complications

## 2013-03-10 NOTE — Anesthesia Procedure Notes (Signed)
Procedure Name: LMA Insertion Date/Time: 03/10/2013 12:49 PM Performed by: Lyndee Leo Pre-anesthesia Checklist: Patient identified, Emergency Drugs available, Suction available and Patient being monitored Patient Re-evaluated:Patient Re-evaluated prior to inductionOxygen Delivery Method: Circle System Utilized Preoxygenation: Pre-oxygenation with 100% oxygen Intubation Type: IV induction Ventilation: Mask ventilation without difficulty LMA: LMA inserted LMA Size: 3.0 Number of attempts: 1 Airway Equipment and Method: bite block Placement Confirmation: positive ETCO2 Tube secured with: Tape Dental Injury: Teeth and Oropharynx as per pre-operative assessment

## 2013-03-10 NOTE — Anesthesia Preprocedure Evaluation (Addendum)
Anesthesia Evaluation  Patient identified by MRN, date of birth, ID band Patient awake    Reviewed: Allergy & Precautions, H&P , NPO status , Patient's Chart, lab work & pertinent test results  Airway Mallampati: I TM Distance: >3 FB Neck ROM: Full    Dental  (+) Teeth Intact and Dental Advisory Given   Pulmonary  breath sounds clear to auscultation        Cardiovascular hypertension, Pt. on medications Rhythm:Regular Rate:Normal     Neuro/Psych    GI/Hepatic   Endo/Other    Renal/GU Dialysis and ESRFRenal disease     Musculoskeletal   Abdominal   Peds  Hematology   Anesthesia Other Findings   Reproductive/Obstetrics                          Anesthesia Physical Anesthesia Plan  ASA: III  Anesthesia Plan: General   Post-op Pain Management:    Induction: Intravenous  Airway Management Planned: LMA  Additional Equipment:   Intra-op Plan:   Post-operative Plan: Extubation in OR  Informed Consent: I have reviewed the patients History and Physical, chart, labs and discussed the procedure including the risks, benefits and alternatives for the proposed anesthesia with the patient or authorized representative who has indicated his/her understanding and acceptance.   Dental advisory given  Plan Discussed with: CRNA, Anesthesiologist and Surgeon  Anesthesia Plan Comments:         Anesthesia Quick Evaluation

## 2013-03-14 ENCOUNTER — Encounter (HOSPITAL_BASED_OUTPATIENT_CLINIC_OR_DEPARTMENT_OTHER): Payer: Self-pay | Admitting: Orthopedic Surgery

## 2013-04-01 ENCOUNTER — Encounter (HOSPITAL_COMMUNITY): Payer: Self-pay | Admitting: *Deleted

## 2013-04-01 ENCOUNTER — Emergency Department (INDEPENDENT_AMBULATORY_CARE_PROVIDER_SITE_OTHER)
Admission: EM | Admit: 2013-04-01 | Discharge: 2013-04-01 | Disposition: A | Discharge status: Home / Self Care | Payer: Medicare Other | Source: Home / Self Care | Attending: Emergency Medicine | Admitting: Emergency Medicine

## 2013-04-01 DIAGNOSIS — T6391XA Toxic effect of contact with unspecified venomous animal, accidental (unintentional), initial encounter: Secondary | ICD-10-CM

## 2013-04-01 DIAGNOSIS — T63461A Toxic effect of venom of wasps, accidental (unintentional), initial encounter: Secondary | ICD-10-CM

## 2013-04-01 MED ORDER — HYDROCORTISONE 1 % EX OINT: TOPICAL_OINTMENT | Freq: Two times a day (BID) | CUTANEOUS | Status: DC

## 2013-04-01 NOTE — ED Provider Notes (Signed)
Medical screening examination/treatment/procedure(s) were performed by non-physician practitioner and as supervising physician I was immediately available for consultation/collaboration.  Burnett Kanaris, MD 04/01/13 1414

## 2013-04-01 NOTE — ED Provider Notes (Signed)
History    CSN: [id number] Arrival date & time 04/01/13  1221  First MD Initiated Contact with Patient 04/01/13 1344     Chief Complaint  Patient presents with  . Hand Pain   (Consider location/radiation/quality/duration/timing/severity/associated sxs/prior Treatment) Patient is a 67 y.o. female presenting with hand pain. The history is provided by the patient. No language interpreter was used.  Hand Pain This is a new problem. The current episode started more than 2 days ago. The problem occurs constantly. The problem has been gradually worsening. Nothing aggravates the symptoms. Nothing relieves the symptoms. She has tried nothing for the symptoms.  Pt was stung on a finger by a bee Past Medical History  Diagnosis Date  . Anemia   . Hyperparathyroidism, secondary   . Back pain, chronic   . Uterine fibroid   . Chronic kidney disease   . 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 Hypertension     off meds now   Past Surgical History  Procedure Laterality Date  . Multiple failed grafts      left thigh AVG 12/03/00, clotted -05/31/03, 01/24/04, 08/28/04, 09/06/04( thrombectomy and revision )Left AVG declot procedure including complete AV shuntogram, 08/29/04 left AV thrombolysis and angioplasty 2012 shunto gram to left thigh AVG  . Hernia repair      laparoscopic repair during a [place] hospitalization from 03/26/2006-03/30/2006  . Hemicolectomy  02/1996  . Arteriovenous graft placement Right 03/19/2000    upper arm  . Removal of a dialysis catheter  05/31/2000    Schon catheter  . Thrombectomy and revision of arterioventous (av) goretex  graft Right 06/04/2000    upper arm  . Insertion of dialysis catheter Left 06/06/2000    IJ Quinton catheter  . Insertion of dialysis catheter Right 06/28/2000    IJ Ash catheter  . Insertion of dialysis catheter Left 08/13/2000    subclavian Ash catheter  . Arteriovenous graft  placement Left 12/03/2000    thigh  . Thrombectomy and revision of arterioventous (av) goretex  graft Left 09/06/2004    thigh  . Incisional hernia repair  04/15/2006    laparoscopic  . Shuntogram Left 02/16/2012    thigh; with angioplasty, venous anastomosis; stent, medial graft pseudoaneurysm  . Revision of arteriovenous goretex graft Left 06/23/2012    thigh; with exc. pseudoaneurysm  . Colonoscopy w/ biopsies    . Carpal tunnel release Left 03/10/2013    Procedure: CARPAL TUNNEL RELEASE;  Surgeon: Wynonia Sours, MD;  Location: [facility];  Service: Orthopedics;  Laterality: Left;   Family History  Problem Relation Age of Onset  . Cancer Mother     COLON   History  Substance Use Topics  . Smoking status: Former Smoker -- 0.10 packs/day    Types: Cigarettes    Quit date: 02/03/1978  . Smokeless tobacco: Never Used  . Alcohol Use: No   OB History   Grav Para Term Preterm Abortions TAB SAB Ect Mult Living                 Review of Systems  All other systems reviewed and are negative.    Allergies  Review of patient's allergies indicates no known allergies.  Home Medications   Current Outpatient Rx  Name  Route  Sig  Dispense  Refill  . BIOTIN 5000 PO   Oral   Take 1 tablet by mouth  daily.         . cinacalcet (SENSIPAR) 90 MG tablet   Oral   Take 90 mg by mouth daily.         . diphenhydramine-acetaminophen (TYLENOL PM) 25-500 MG TABS   Oral   Take 1 tablet by mouth at bedtime as needed.         Marland Kitchen HYDROcodone-acetaminophen (NORCO) 5-325 MG per tablet   Oral   Take 1 tablet by mouth every 6 (six) hours as needed for pain.   30 tablet   0   . Menthol, Topical Analgesic, (BIOFREEZE EX)   Apply externally   Apply 1 application topically 2 (two) times daily as needed. For pain/stiffness         . multivitamin (RENA-VIT) TABS tablet   Oral   Take 1 tablet by mouth daily.         Marland Kitchen PROTEIN PO   Oral   Take by mouth as needed. 2  teaspoons occasionally         . sevelamer (RENVELA) 800 MG tablet   Oral   Take 800 mg by mouth 3 (three) times daily with meals.          BP 123/53  Pulse 97  Temp(Src) 98.1 F (36.7 C) (Oral)  Resp 20  SpO2 98%  LMP 02/16/2012 Physical Exam  Nursing note and vitals reviewed. Constitutional: She is oriented to person, place, and time. She appears well-developed and well-nourished.  Musculoskeletal: She exhibits tenderness.  Slight swelling left index finger,  No erythema,  Good cap refill  Neurological: She is alert and oriented to person, place, and time. She has normal reflexes.  Skin: Skin is warm.  Psychiatric: She has a normal mood and affect.    ED Course  Procedures (including critical care time) Labs Reviewed - No data to display No results found. 1. Bee sting reaction, initial encounter     MDM  Pt advised hydrocortisone ointment.   Return if any problems.  [place], PA-C 04/01/13 [employer], [place] 04/01/13 1401

## 2013-04-01 NOTE — ED Notes (Addendum)
Pt  Reports  She  Was  Stung  l  Hand  By a  Wasp  And  Pas itching   And  Swelling      To  The  Area  Of  The  Sting     Pt  Is  A  Dialysis  Pt  Was  dialysed  Yesterday     No  Angioedema   Speaking in  Complete  sentances  No  Nausea  Or  Vomiting

## 2013-04-28 DIAGNOSIS — R0602 Shortness of breath: Secondary | ICD-10-CM | POA: Insufficient documentation

## 2013-10-04 ENCOUNTER — Other Ambulatory Visit: Payer: Self-pay | Admitting: *Deleted

## 2013-10-04 DIAGNOSIS — M79609 Pain in unspecified limb: Secondary | ICD-10-CM

## 2013-10-13 ENCOUNTER — Other Ambulatory Visit: Payer: Self-pay | Admitting: *Deleted

## 2013-10-13 DIAGNOSIS — M79609 Pain in unspecified limb: Secondary | ICD-10-CM

## 2013-10-30 ENCOUNTER — Encounter: Payer: Self-pay | Admitting: Vascular Surgery

## 2013-10-31 ENCOUNTER — Ambulatory Visit (HOSPITAL_COMMUNITY)
Admission: RE | Admit: 2013-10-31 | Discharge: 2013-10-31 | Disposition: A | Discharge status: Home / Self Care | Payer: Medicare Other | Source: Ambulatory Visit | Attending: Vascular Surgery | Admitting: Vascular Surgery

## 2013-10-31 ENCOUNTER — Encounter: Payer: Self-pay | Admitting: Vascular Surgery

## 2013-10-31 ENCOUNTER — Ambulatory Visit (INDEPENDENT_AMBULATORY_CARE_PROVIDER_SITE_OTHER): Payer: Medicare Other | Admitting: Vascular Surgery

## 2013-10-31 ENCOUNTER — Ambulatory Visit (INDEPENDENT_AMBULATORY_CARE_PROVIDER_SITE_OTHER)
Admission: RE | Admit: 2013-10-31 | Discharge: 2013-10-31 | Disposition: A | Discharge status: Home / Self Care | Payer: Medicare Other | Source: Ambulatory Visit | Attending: Vascular Surgery | Admitting: Vascular Surgery

## 2013-10-31 VITALS — BP 122/74 | HR 76 | Resp 18 | Ht 62.0 in | Wt 198.0 lb

## 2013-10-31 DIAGNOSIS — M79609 Pain in unspecified limb: Secondary | ICD-10-CM

## 2013-10-31 DIAGNOSIS — N186 End stage renal disease: Secondary | ICD-10-CM

## 2013-10-31 DIAGNOSIS — T82898A Other specified complication of vascular prosthetic devices, implants and grafts, initial encounter: Secondary | ICD-10-CM | POA: Insufficient documentation

## 2013-10-31 DIAGNOSIS — R209 Unspecified disturbances of skin sensation: Secondary | ICD-10-CM | POA: Insufficient documentation

## 2013-10-31 DIAGNOSIS — Y832 Surgical operation with anastomosis, bypass or graft as the cause of abnormal reaction of the patient, or of later complication, without mention of misadventure at the time of the procedure: Secondary | ICD-10-CM | POA: Insufficient documentation

## 2013-10-31 NOTE — Progress Notes (Signed)
Subjective:     Patient ID: Sue Neal, female   DOB: 1946/03/22, 68 y.o.   MRN: [id number]  HPI this 68 year old female is referred by Dr. Lorrene Reid for pain in the right upper extremity and occasional discomfort and numbness in both feet. Patient has an old access in the right upper extremity which has not been used for many years. She currently has a thigh graft on the left which is functioning well. She denies any drainage from the right upper extremity. There has been no redness or tenderness but some aching discomfort for a few weeks which is improving. She is concerned about whether the blood pressure cuff may have been causing this and she is now using her contralateral left arm. She's had no infection in the toes of her feet but does have some numbness.  Past Medical History  Diagnosis Date  . Anemia   . Hyperparathyroidism, secondary   . Back pain, chronic   . Uterine fibroid   . Chronic kidney disease   . 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 Hypertension     off meds now    History  Substance Use Topics  . Smoking status: Former Smoker -- 0.10 packs/day    Types: Cigarettes    Quit date: 02/03/1978  . Smokeless tobacco: Never Used  . Alcohol Use: No    Family History  Problem Relation Age of Onset  . Cancer Mother     COLON    No Known Allergies  Current outpatient prescriptions:cinacalcet (SENSIPAR) 90 MG tablet, Take 90 mg by mouth daily., Disp: , Rfl: ;  diphenhydramine-acetaminophen (TYLENOL PM) 25-500 MG TABS, Take 1 tablet by mouth at bedtime as needed., Disp: , Rfl: ;  multivitamin (RENA-VIT) TABS tablet, Take 1 tablet by mouth daily., Disp: , Rfl: ;  PROTEIN PO, Take by mouth as needed. 2 teaspoons occasionally, Disp: , Rfl:  sevelamer (RENVELA) 800 MG tablet, Take 800 mg by mouth 3 (three) times daily with meals., Disp: , Rfl: ;  traMADol (ULTRAM) 50 MG tablet, Take 50 mg by mouth every 6  (six) hours as needed., Disp: , Rfl: ;  BIOTIN 5000 PO, Take 1 tablet by mouth daily., Disp: , Rfl: ;  HYDROcodone-acetaminophen (NORCO) 5-325 MG per tablet, Take 1 tablet by mouth every 6 (six) hours as needed for pain., Disp: 30 tablet, Rfl: 0 hydrocortisone 1 % ointment, Apply topically 2 (two) times daily., Disp: 30 g, Rfl: 0;  Menthol, Topical Analgesic, (BIOFREEZE EX), Apply 1 application topically 2 (two) times daily as needed. For pain/stiffness, Disp: , Rfl:   BP 122/74  Pulse 76  Resp 18  Ht 5\' 2"  (1.575 m)  Wt 198 lb (89.812 kg)  BMI 36.21 kg/m2  LMP 02/16/2012  Body mass index is 36.21 kg/(m^2).          Review of Systems denies chest pain, dyspnea on exertion. Does complain of pain in the feet, swelling in the legs, numbness in the feet.     Objective:   Physical Exam BP 122/74  Pulse 76  Resp 18  Ht 5\' 2"  (1.575 m)  Wt 198 lb (89.812 kg)  BMI 36.21 kg/m2  LMP 02/16/2012  General well-developed well-nourished female no apparent stress alert and oriented x3 Right upper extremity has chronically occluded access in the upper arm with no tenderness to palpation. There is no fluctuance or erythema noted in this general  area. 3+ radial and 2+ radial pulse palpable with right hand well perfused. Both lower extremities are well-perfused with posterior tibial pulse 2+ bilaterally.  Today I ordered arterial duplex of the right upper extremity and ABIs of both lower strabismic are reviewed and interpreted. There is excellent biphasic flow in the right upper extremity including the radial ulnar arteries with no evidence of any problems related to the old access site which is thrombosed. Lower extremities have triphasic flow with ABIs exceeding 1.0 bilaterally.     Assessment:     No evidence of arterial insufficiency in right upper extremity or bilateral lower extremities to account for symptoms     Plan:     Would recommend using blood pressure cuff on left upper  extremity as she is doing and treating the area of discomfort with moist heat No evidence of arterial insufficiency in either upper or bilateral lower extremities-patient reassured

## 2014-04-11 ENCOUNTER — Encounter: Payer: Self-pay | Admitting: Internal Medicine

## 2014-05-15 ENCOUNTER — Encounter (HOSPITAL_COMMUNITY): Payer: Medicare Other | Attending: Cardiovascular Disease | Admitting: Radiology

## 2014-05-15 VITALS — BP 97/46 | Ht 61.0 in | Wt 202.0 lb

## 2014-05-15 DIAGNOSIS — R0609 Other forms of dyspnea: Secondary | ICD-10-CM

## 2014-05-15 DIAGNOSIS — I959 Hypotension, unspecified: Secondary | ICD-10-CM | POA: Diagnosis not present

## 2014-05-15 DIAGNOSIS — R0989 Other specified symptoms and signs involving the circulatory and respiratory systems: Secondary | ICD-10-CM

## 2014-05-15 DIAGNOSIS — Z01810 Encounter for preprocedural cardiovascular examination: Secondary | ICD-10-CM | POA: Diagnosis not present

## 2014-05-15 MED ORDER — TECHNETIUM TC 99M SESTAMIBI GENERIC - CARDIOLITE
33.0000 | Freq: Once | INTRAVENOUS | Status: AC | PRN
  Administered 2014-05-15: 33 via INTRAVENOUS

## 2014-05-15 MED ORDER — TECHNETIUM TC 99M SESTAMIBI GENERIC - CARDIOLITE
11.0000 | Freq: Once | INTRAVENOUS | Status: AC | PRN
  Administered 2014-05-15: 11 via INTRAVENOUS

## 2014-05-15 MED ORDER — REGADENOSON 0.4 MG/5ML IV SOLN
0.4000 mg | Freq: Once | INTRAVENOUS | Status: AC
  Administered 2014-05-15: 0.4 mg via INTRAVENOUS

## 2014-05-15 NOTE — Progress Notes (Signed)
[facility] [address] [place], [place] [address] [phone]    Cardiology Nuclear Med Study  NETRA VESSELS is a 68 y.o. female     MRN : [id number]     DOB: 08-24-1946  Procedure Date: 05/15/2014  Nuclear Med Background Indication for Stress Test:  Evaluation for Ischemia, Hypotension,and Pending Surgical Clearance for Renal Transplant History:  No H/O CAD 12 yrs ago MPI Cardiac Risk Factors: Hypertension and PVD  Symptoms:  DOE and Fatigue   Nuclear Pre-Procedure Caffeine/Decaff Intake:  None> 12 hrs NPO After: 7:30pm   Lungs:  clear O2 Sat: 95% on room air. IV 0.9% NS with Angio Cath:  22g  IV Site: R Wrist x 1, tolerated well IV Started by:  Irven Baltimore, RN  Chest Size (in):  42 Cup Size: DDD  Height: 5\' 1"  (1.549 m)  Weight:  202 lb (91.627 kg)  BMI:  Body mass index is 38.19 kg/(m^2). Tech Comments:  N/A    Nuclear Med Study 1 or 2 day study: 1 day  Stress Test Type:  Lexiscan  Reading MD: N/A  Order Authorizing Provider:  Otelia Santee, MD, and Jamal Maes  Resting Radionuclide: Technetium 67m Sestamibi  Resting Radionuclide Dose: 11.0 mCi   Stress Radionuclide:  Technetium 73m Sestamibi  Stress Radionuclide Dose: 33.0 mCi           Stress Protocol Rest HR: 83 Stress HR: 104  Rest BP: 97/46 Stress BP: 98/39  Exercise Time (min): n/a METS: n/a   Predicted Max HR: 152 bpm % Max HR: 65.79 bpm Rate Pressure Product: 9800   Dose of Adenosine (mg):  n/a Dose of Lexiscan: 0.4 mg  Dose of Atropine (mg): n/a Dose of Dobutamine: n/a mcg/kg/min (at max HR)  Stress Test Technologist: Perrin Maltese, EMT-P  Nuclear Technologist:  Vedia Pereyra, CNMT     Rest Procedure:  Myocardial perfusion imaging was performed at rest 45 minutes following the intravenous administration of Technetium 76m Sestamibi. Rest ECG: NSR - Normal EKG  Stress Procedure:  The patient received IV Lexiscan 0.4 mg over 15-seconds.  Technetium 98m Sestamibi  injected at 30-seconds. This patient was hot,fatigued, and had abdominal pain with the Lexiscan injection. Quantitative spect images were obtained after a 45 minute delay. Stress ECG: No significant change from baseline ECG  QPS Raw Data Images:  Normal; no motion artifact; normal heart/lung ratio. Stress Images:  Normal homogeneous uptake in all areas of the myocardium. Rest Images:  Normal homogeneous uptake in all areas of the myocardium. Subtraction (SDS):  There is no evidence of scar or ischemia. Transient Ischemic Dilatation (Normal <1.22):  1.12 Lung/Heart Ratio (Normal <0.45):  0.33  Quantitative Gated Spect Images QGS EDV:  28 ml QGS ESV:  06 ml  Impression Exercise Capacity:  Lexiscan with no exercise. BP Response:  Normal blood pressure response. Clinical Symptoms:  Abdominal discomfort. ECG Impression:  No significant ST segment change suggestive of ischemia. Comparison with Prior Nuclear Study: No images to compare  Overall Impression:  Normal stress nuclear study.  LV Ejection Fraction: 80%.  LV Wall Motion:  NL LV Function; NL Wall Motion  Of note, polar map read significant defects with SSS 12, SRS 6.  However, there were no significant defects noted on image review.   Loralie Champagne 05/16/2014

## 2014-05-16 ENCOUNTER — Encounter: Payer: Self-pay | Admitting: Internal Medicine

## 2014-05-16 ENCOUNTER — Encounter: Payer: Self-pay | Admitting: Cardiovascular Disease

## 2014-05-21 ENCOUNTER — Encounter: Payer: Self-pay | Admitting: Internal Medicine

## 2014-05-24 ENCOUNTER — Other Ambulatory Visit (HOSPITAL_COMMUNITY): Payer: Medicare Other

## 2014-05-30 ENCOUNTER — Ambulatory Visit (HOSPITAL_COMMUNITY): Payer: Medicare Other | Attending: Cardiology

## 2014-05-30 ENCOUNTER — Other Ambulatory Visit (HOSPITAL_COMMUNITY): Payer: Self-pay | Admitting: Nephrology

## 2014-05-30 DIAGNOSIS — N186 End stage renal disease: Secondary | ICD-10-CM

## 2014-05-30 DIAGNOSIS — D649 Anemia, unspecified: Secondary | ICD-10-CM | POA: Diagnosis not present

## 2014-05-30 DIAGNOSIS — R9389 Abnormal findings on diagnostic imaging of other specified body structures: Secondary | ICD-10-CM | POA: Insufficient documentation

## 2014-05-30 DIAGNOSIS — I079 Rheumatic tricuspid valve disease, unspecified: Secondary | ICD-10-CM | POA: Diagnosis not present

## 2014-05-30 DIAGNOSIS — I120 Hypertensive chronic kidney disease with stage 5 chronic kidney disease or end stage renal disease: Secondary | ICD-10-CM | POA: Diagnosis not present

## 2014-05-30 DIAGNOSIS — I2789 Other specified pulmonary heart diseases: Secondary | ICD-10-CM

## 2014-05-30 DIAGNOSIS — I059 Rheumatic mitral valve disease, unspecified: Secondary | ICD-10-CM | POA: Diagnosis not present

## 2014-05-30 DIAGNOSIS — Z87891 Personal history of nicotine dependence: Secondary | ICD-10-CM | POA: Insufficient documentation

## 2014-05-30 DIAGNOSIS — M549 Dorsalgia, unspecified: Secondary | ICD-10-CM | POA: Diagnosis not present

## 2014-05-30 DIAGNOSIS — I319 Disease of pericardium, unspecified: Secondary | ICD-10-CM

## 2014-05-30 NOTE — Progress Notes (Signed)
2D Echo completed. 05/30/2014

## 2014-06-13 ENCOUNTER — Encounter: Payer: Self-pay | Admitting: Internal Medicine

## 2014-07-02 ENCOUNTER — Other Ambulatory Visit: Payer: Self-pay | Admitting: Nephrology

## 2014-07-02 ENCOUNTER — Ambulatory Visit
Admission: RE | Admit: 2014-07-02 | Discharge: 2014-07-02 | Disposition: A | Discharge status: Home / Self Care | Payer: Medicare Other | Source: Ambulatory Visit | Attending: Nephrology | Admitting: Nephrology

## 2014-07-02 DIAGNOSIS — M545 Low back pain: Secondary | ICD-10-CM

## 2014-07-06 ENCOUNTER — Other Ambulatory Visit: Payer: Self-pay | Admitting: Internal Medicine

## 2014-08-30 ENCOUNTER — Encounter (HOSPITAL_COMMUNITY): Payer: Self-pay | Admitting: Surgery

## 2014-10-04 ENCOUNTER — Encounter (HOSPITAL_COMMUNITY): Payer: Self-pay | Admitting: Vascular Surgery

## 2015-02-12 ENCOUNTER — Other Ambulatory Visit: Payer: Self-pay | Admitting: Orthopedic Surgery

## 2015-02-28 ENCOUNTER — Ambulatory Visit (INDEPENDENT_AMBULATORY_CARE_PROVIDER_SITE_OTHER): Payer: Medicare Other | Admitting: Podiatry

## 2015-02-28 ENCOUNTER — Encounter: Payer: Self-pay | Admitting: Podiatry

## 2015-02-28 VITALS — BP 116/47 | HR 97 | Resp 15

## 2015-02-28 DIAGNOSIS — B351 Tinea unguium: Secondary | ICD-10-CM | POA: Diagnosis not present

## 2015-02-28 NOTE — Progress Notes (Signed)
   Subjective:    Patient ID: Sue Neal, female    DOB: 04-01-1946, 69 y.o.   MRN: [id number]  HPI Pt presents with b/l thickened nails, she has not tried anything otc for this problem, denies any pain or other issues with her feet   Review of Systems  All other systems reviewed and are negative.      Objective:   Physical Exam        Assessment & Plan:

## 2015-03-01 NOTE — Progress Notes (Signed)
Subjective:     Patient ID: Sue Neal, female   DOB: 1945-12-09, 69 y.o.   MRN: [id number]  HPI patient presents stating I have thick toenails and I was concerned and wanted note there is anything that could be done   Review of Systems  All other systems reviewed and are negative.      Objective:   Physical Exam  Constitutional: She is oriented to person, place, and time.  Cardiovascular: Intact distal pulses.   Musculoskeletal: Normal range of motion.  Neurological: She is oriented to person, place, and time.  Skin: Skin is warm.  Nursing note and vitals reviewed.  neurovascular status intact muscle strength adequate range of motion within normal limits with thickness of the hallux nailbeds bilateral with yellow like discoloration but no indication of proximal spread     Assessment:     Probable trauma to the nailbeds creating mild thickness but appears to be more due to the overall structure of the beds themselves    Plan:     H&P and condition explained. I do not recommend treatment as they do not bother her and I think it's due more to trauma. Advised on wider shoes and reappoint as needed

## 2015-03-21 ENCOUNTER — Encounter (HOSPITAL_BASED_OUTPATIENT_CLINIC_OR_DEPARTMENT_OTHER): Payer: Self-pay | Admitting: *Deleted

## 2015-03-21 NOTE — Progress Notes (Signed)
Pt has dialysis q Wed at [facility]. Center called and CMET done there 03/20/15 requested.

## 2015-03-28 ENCOUNTER — Ambulatory Visit (HOSPITAL_BASED_OUTPATIENT_CLINIC_OR_DEPARTMENT_OTHER): Payer: Medicare Other | Admitting: Certified Registered"

## 2015-03-28 ENCOUNTER — Encounter (HOSPITAL_BASED_OUTPATIENT_CLINIC_OR_DEPARTMENT_OTHER)
Admission: RE | Disposition: A | Discharge status: Home / Self Care | Payer: Self-pay | Source: Ambulatory Visit | Attending: Orthopedic Surgery

## 2015-03-28 ENCOUNTER — Ambulatory Visit (HOSPITAL_BASED_OUTPATIENT_CLINIC_OR_DEPARTMENT_OTHER)
Admission: RE | Admit: 2015-03-28 | Discharge: 2015-03-28 | Disposition: A | Discharge status: Home / Self Care | Payer: Medicare Other | Source: Ambulatory Visit | Attending: Orthopedic Surgery | Admitting: Orthopedic Surgery

## 2015-03-28 ENCOUNTER — Encounter (HOSPITAL_BASED_OUTPATIENT_CLINIC_OR_DEPARTMENT_OTHER): Payer: Self-pay | Admitting: Orthopedic Surgery

## 2015-03-28 DIAGNOSIS — M549 Dorsalgia, unspecified: Secondary | ICD-10-CM | POA: Insufficient documentation

## 2015-03-28 DIAGNOSIS — M65311 Trigger thumb, right thumb: Secondary | ICD-10-CM | POA: Insufficient documentation

## 2015-03-28 DIAGNOSIS — Z992 Dependence on renal dialysis: Secondary | ICD-10-CM | POA: Insufficient documentation

## 2015-03-28 DIAGNOSIS — N186 End stage renal disease: Secondary | ICD-10-CM | POA: Insufficient documentation

## 2015-03-28 DIAGNOSIS — Z79899 Other long term (current) drug therapy: Secondary | ICD-10-CM | POA: Insufficient documentation

## 2015-03-28 DIAGNOSIS — I120 Hypertensive chronic kidney disease with stage 5 chronic kidney disease or end stage renal disease: Secondary | ICD-10-CM | POA: Diagnosis not present

## 2015-03-28 DIAGNOSIS — N2581 Secondary hyperparathyroidism of renal origin: Secondary | ICD-10-CM | POA: Diagnosis not present

## 2015-03-28 DIAGNOSIS — G5601 Carpal tunnel syndrome, right upper limb: Secondary | ICD-10-CM | POA: Diagnosis present

## 2015-03-28 DIAGNOSIS — M67843 Other specified disorders of tendon, right hand: Secondary | ICD-10-CM | POA: Insufficient documentation

## 2015-03-28 DIAGNOSIS — M1990 Unspecified osteoarthritis, unspecified site: Secondary | ICD-10-CM | POA: Insufficient documentation

## 2015-03-28 DIAGNOSIS — G8929 Other chronic pain: Secondary | ICD-10-CM | POA: Diagnosis not present

## 2015-03-28 DIAGNOSIS — Z87891 Personal history of nicotine dependence: Secondary | ICD-10-CM | POA: Diagnosis not present

## 2015-03-28 DIAGNOSIS — Z6837 Body mass index (BMI) 37.0-37.9, adult: Secondary | ICD-10-CM | POA: Diagnosis not present

## 2015-03-28 DIAGNOSIS — Z79891 Long term (current) use of opiate analgesic: Secondary | ICD-10-CM | POA: Insufficient documentation

## 2015-03-28 HISTORY — PX: TRIGGER FINGER RELEASE: SHX641

## 2015-03-28 HISTORY — PX: CARPAL TUNNEL RELEASE: SHX101

## 2015-03-28 LAB — POCT I-STAT, CHEM 8
BUN: 13 mg/dL (ref 6–20)
Calcium, Ion: 1.3 mmol/L (ref 1.13–1.30)
Chloride: 96 mmol/L — ABNORMAL LOW (ref 101–111)
Creatinine, Ser: 5.7 mg/dL — ABNORMAL HIGH (ref 0.44–1.00)
Glucose, Bld: 78 mg/dL (ref 65–99)
HCT: 43 % (ref 36.0–46.0)
HEMOGLOBIN: 14.6 g/dL (ref 12.0–15.0)
Potassium: 3.9 mmol/L (ref 3.5–5.1)
SODIUM: 140 mmol/L (ref 135–145)
TCO2: 31 mmol/L (ref 0–100)

## 2015-03-28 SURGERY — CARPAL TUNNEL RELEASE
Anesthesia: Monitor Anesthesia Care | Site: Wrist | Laterality: Right

## 2015-03-28 MED ORDER — HYDROCODONE-ACETAMINOPHEN 7.5-325 MG PO TABS
1.0000 | ORAL_TABLET | Freq: Once | ORAL | Status: DC | PRN
Start: 2015-03-28 — End: 2015-03-28

## 2015-03-28 MED ORDER — CEFAZOLIN SODIUM-DEXTROSE 2-3 GM-% IV SOLR
2.0000 g | INTRAVENOUS | Status: AC
  Administered 2015-03-28: 2 g via INTRAVENOUS

## 2015-03-28 MED ORDER — SCOPOLAMINE 1 MG/3DAYS TD PT72: 1.0000 | MEDICATED_PATCH | Freq: Once | TRANSDERMAL | Status: DC | PRN

## 2015-03-28 MED ORDER — BUPIVACAINE HCL (PF) 0.25 % IJ SOLN
INTRAMUSCULAR | Status: DC | PRN
  Administered 2015-03-28: 5 mL

## 2015-03-28 MED ORDER — LIDOCAINE HCL (PF) 0.5 % IJ SOLN
INTRAMUSCULAR | Status: DC | PRN
  Administered 2015-03-28: 30 mL via INTRAVENOUS

## 2015-03-28 MED ORDER — MIDAZOLAM HCL 2 MG/2ML IJ SOLN
INTRAMUSCULAR | Status: AC
  Filled 2015-03-28: qty 2

## 2015-03-28 MED ORDER — FENTANYL CITRATE (PF) 100 MCG/2ML IJ SOLN
INTRAMUSCULAR | Status: DC | PRN
  Administered 2015-03-28: 25 ug via INTRAVENOUS
  Administered 2015-03-28: 50 ug via INTRAVENOUS
  Administered 2015-03-28: 25 ug via INTRAVENOUS

## 2015-03-28 MED ORDER — FENTANYL CITRATE (PF) 100 MCG/2ML IJ SOLN: 25.0000 ug | INTRAMUSCULAR | Status: DC | PRN

## 2015-03-28 MED ORDER — CHLORHEXIDINE GLUCONATE 4 % EX LIQD: 60.0000 mL | Freq: Once | CUTANEOUS | Status: DC

## 2015-03-28 MED ORDER — FENTANYL CITRATE (PF) 100 MCG/2ML IJ SOLN
INTRAMUSCULAR | Status: AC
  Filled 2015-03-28: qty 4

## 2015-03-28 MED ORDER — PROPOFOL INFUSION 10 MG/ML OPTIME
INTRAVENOUS | Status: DC | PRN
  Administered 2015-03-28: 25 ug/kg/min via INTRAVENOUS

## 2015-03-28 MED ORDER — CEFAZOLIN SODIUM-DEXTROSE 2-3 GM-% IV SOLR
INTRAVENOUS | Status: AC
  Filled 2015-03-28: qty 50

## 2015-03-28 MED ORDER — CEFAZOLIN SODIUM-DEXTROSE 2-3 GM-% IV SOLR: 2.0000 g | INTRAVENOUS | Status: DC

## 2015-03-28 MED ORDER — GLYCOPYRROLATE 0.2 MG/ML IJ SOLN: 0.2000 mg | Freq: Once | INTRAMUSCULAR | Status: DC | PRN

## 2015-03-28 MED ORDER — PROMETHAZINE HCL 25 MG/ML IJ SOLN: 6.2500 mg | INTRAMUSCULAR | Status: DC | PRN

## 2015-03-28 MED ORDER — HYDROCODONE-ACETAMINOPHEN 5-325 MG PO TABS: 1.0000 | ORAL_TABLET | Freq: Four times a day (QID) | ORAL | Status: DC | PRN

## 2015-03-28 MED ORDER — ONDANSETRON HCL 4 MG/2ML IJ SOLN
INTRAMUSCULAR | Status: DC | PRN
  Administered 2015-03-28: 4 mg via INTRAVENOUS

## 2015-03-28 MED ORDER — LACTATED RINGERS IV SOLN
INTRAVENOUS | Status: DC
  Administered 2015-03-28: 09:00:00 via INTRAVENOUS

## 2015-03-28 SURGICAL SUPPLY — 40 items
BANDAGE COBAN STERILE 2 (GAUZE/BANDAGES/DRESSINGS) ×2 IMPLANT
BLADE SURG 15 STRL LF DISP TIS (BLADE) ×2 IMPLANT
BLADE SURG 15 STRL SS (BLADE) ×4
BNDG CMPR 9X4 STRL LF SNTH (GAUZE/BANDAGES/DRESSINGS)
BNDG COHESIVE 3X5 TAN STRL LF (GAUZE/BANDAGES/DRESSINGS) ×4 IMPLANT
BNDG ESMARK 4X9 LF (GAUZE/BANDAGES/DRESSINGS) IMPLANT
BNDG GAUZE ELAST 4 BULKY (GAUZE/BANDAGES/DRESSINGS) ×4 IMPLANT
CHLORAPREP W/TINT 26ML (MISCELLANEOUS) ×4 IMPLANT
CORDS BIPOLAR (ELECTRODE) ×4 IMPLANT
COVER BACK TABLE 60X90IN (DRAPES) ×4 IMPLANT
COVER MAYO STAND STRL (DRAPES) ×4 IMPLANT
CUFF TOURNIQUET SINGLE 18IN (TOURNIQUET CUFF) ×4 IMPLANT
DECANTER SPIKE VIAL GLASS SM (MISCELLANEOUS) IMPLANT
DRAPE EXTREMITY T 121X128X90 (DRAPE) ×4 IMPLANT
DRAPE SURG 17X23 STRL (DRAPES) ×4 IMPLANT
DRSG PAD ABDOMINAL 8X10 ST (GAUZE/BANDAGES/DRESSINGS) ×4 IMPLANT
GAUZE SPONGE 4X4 12PLY STRL (GAUZE/BANDAGES/DRESSINGS) ×4 IMPLANT
GAUZE XEROFORM 1X8 LF (GAUZE/BANDAGES/DRESSINGS) ×4 IMPLANT
GLOVE BIO SURGEON STRL SZ7.5 (GLOVE) ×2 IMPLANT
GLOVE BIOGEL PI IND STRL 7.0 (GLOVE) IMPLANT
GLOVE BIOGEL PI IND STRL 8.5 (GLOVE) ×2 IMPLANT
GLOVE BIOGEL PI INDICATOR 7.0 (GLOVE) ×4
GLOVE BIOGEL PI INDICATOR 8.5 (GLOVE) ×2
GLOVE ECLIPSE 6.5 STRL STRAW (GLOVE) ×2 IMPLANT
GLOVE SURG ORTHO 8.0 STRL STRW (GLOVE) ×4 IMPLANT
GLOVE SURG SS PI 7.5 STRL IVOR (GLOVE) ×2 IMPLANT
GOWN STRL REUS W/ TWL LRG LVL3 (GOWN DISPOSABLE) ×2 IMPLANT
GOWN STRL REUS W/TWL LRG LVL3 (GOWN DISPOSABLE) ×4
GOWN STRL REUS W/TWL XL LVL3 (GOWN DISPOSABLE) ×4 IMPLANT
NDL PRECISIONGLIDE 27X1.5 (NEEDLE) ×2 IMPLANT
NEEDLE PRECISIONGLIDE 27X1.5 (NEEDLE) ×4 IMPLANT
NS IRRIG 1000ML POUR BTL (IV SOLUTION) ×4 IMPLANT
PACK BASIN DAY SURGERY FS (CUSTOM PROCEDURE TRAY) ×4 IMPLANT
STOCKINETTE 4X48 STRL (DRAPES) ×4 IMPLANT
SUT ETHILON 4 0 PS 2 18 (SUTURE) ×4 IMPLANT
SUT VICRYL 4-0 PS2 18IN ABS (SUTURE) IMPLANT
SYR BULB 3OZ (MISCELLANEOUS) ×4 IMPLANT
SYR CONTROL 10ML LL (SYRINGE) ×4 IMPLANT
TOWEL OR 17X24 6PK STRL BLUE (TOWEL DISPOSABLE) ×8 IMPLANT
UNDERPAD 30X30 (UNDERPADS AND DIAPERS) ×4 IMPLANT

## 2015-03-28 NOTE — Anesthesia Procedure Notes (Signed)
Procedure Name: MAC Date/Time: 03/28/2015 8:54 AM Performed by: Marrianne Mood Pre-anesthesia Checklist: Patient identified, Timeout performed, Emergency Drugs available, Suction available and Patient being monitored Patient Re-evaluated:Patient Re-evaluated prior to inductionOxygen Delivery Method: Simple face mask

## 2015-03-28 NOTE — H&P (Signed)
Sue Neal is a 69 year old left hand dominant female with bilateral carpal tunnel syndrome. She has been referred by Dr. Lorrene Reid. She has undergone release of her left hand carpal tunnel in June of 2014 with nerve conductions be positive at 6/7 and 5/0 right and left. She has sensory delay of 3.4 and 3.1. She has diminished amplitudes on both sides. She has done well on her left carpal tunnel release. She has developed triggering of her right thumb. She has no history of injury. She is awakened almost every night with this. This is a history of polycystic disease and treated by Dr. Lorrene Reid. She has dialysis on Monday, Wednesday, and Friday. She has a history of arthritis. There is no history of diabetes, thyroid problems, or gout. She has been tested for diabetes as they are in the 1/3 of the family history.  PAST MEDICAL HISTORY: She has no drug allergies. She is on Reglan, Sensipar, Nepro and Hydrocodone. She has a graft and carpal tunnel release.  FAMILY MEDICAL HISTORY: Positive for diabetes, high BP and arthritis.  SOCIAL HISTORY:  She does not smoke or use alcohol. She is single and retired.   REVIEW OF SYSTEMS: Positive for glasses, kidney disease, otherwise negative 14 points.  Sue Neal is an 69 y.o. female.   Chief Complaint: Carpal tunnel syndrome and triggering right thumb HPI: see above  Past Medical History  Diagnosis Date  . Anemia   . Hyperparathyroidism, secondary   . Back pain, chronic   . Uterine fibroid   . Chronic kidney disease   . 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 Hypertension     off meds now    Past Surgical History  Procedure Laterality Date  . Multiple failed grafts      left thigh AVG 12/03/00, clotted -05/31/03, 01/24/04, 08/28/04, 09/06/04( thrombectomy and revision )Left AVG declot procedure including complete AV shuntogram, 08/29/04 left AV thrombolysis and angioplasty 2012 shunto  gram to left thigh AVG  . Hernia repair      laparoscopic repair during a [place] hospitalization from 03/26/2006-03/30/2006  . Hemicolectomy  02/1996  . Arteriovenous graft placement Right 03/19/2000    upper arm  . Removal of a dialysis catheter  05/31/2000    Schon catheter  . Thrombectomy and revision of arterioventous (av) goretex  graft Right 06/04/2000    upper arm  . Insertion of dialysis catheter Left 06/06/2000    IJ Quinton catheter  . Insertion of dialysis catheter Right 06/28/2000    IJ Ash catheter  . Insertion of dialysis catheter Left 08/13/2000    subclavian Ash catheter  . Arteriovenous graft placement Left 12/03/2000    thigh  . Thrombectomy and revision of arterioventous (av) goretex  graft Left 09/06/2004    thigh  . Incisional hernia repair  04/15/2006    laparoscopic  . Shuntogram Left 02/16/2012    thigh; with angioplasty, venous anastomosis; stent, medial graft pseudoaneurysm  . Revision of arteriovenous goretex graft Left 06/23/2012    thigh; with exc. pseudoaneurysm  . Colonoscopy w/ biopsies    . Carpal tunnel release Left 03/10/2013    Procedure: CARPAL TUNNEL RELEASE;  Surgeon: Wynonia Sours, MD;  Location: [facility];  Service: Orthopedics;  Laterality: Left;  . Shuntogram N/A 02/16/2012    Procedure: Earney Mallet;  Surgeon: Serafina Mitchell, MD;  Location: [facility] CATH LAB;  Service: Cardiovascular;  Laterality: N/A;    Family History  Problem Relation Age of Onset  . Cancer Mother     COLON   Social History:  reports that she quit smoking about 37 years ago. Her smoking use included Cigarettes. She smoked 0.10 packs per day. She has never used smokeless tobacco. She reports that she does not drink alcohol or use illicit drugs.  Allergies: No Known Allergies  No prescriptions prior to admission    No results found for this or any previous visit (from the past 48 hour(s)).  No results found.   Pertinent items are noted in HPI.  Height 5'  1" (1.549 m), weight 90.719 kg (200 lb), last menstrual period 02/16/2012.  General appearance: alert, cooperative and appears stated age Head: Normocephalic, without obvious abnormality Neck: no JVD Resp: clear to auscultation bilaterally Cardio: regular rate and rhythm, S1, S2 normal, no murmur, click, rub or gallop GI: soft, non-tender; bowel sounds normal; no masses,  no organomegaly Extremities: catching right thumb numbness right hand Pulses: 2+ and symmetric Skin: Skin color, texture, turgor normal. No rashes or lesions Neurologic: Grossly normal Incision/Wound: na  Assessment/Plan DIAGNOSIS: (1) carpal tunnel syndrome. (2) Trigger thumb right side.  She has decided she would like to proceed to have this surgically released. Pre, peri and post op care are discussed along with risks and complications. Patient is aware there is no guarantee with surgery, possibility of infection, injury to arteries, nerves, and tendons, incomplete relief and dystrophy. She is scheduled for release A-1 pulley right thumb, carpal tunnel release right side as an outpatient under regional/general anesthesia.  [name] R 03/28/2015, 7:35 AM

## 2015-03-28 NOTE — Anesthesia Postprocedure Evaluation (Signed)
  Anesthesia Post-op Note  Patient: Sue Neal  Procedure(s) Performed: Procedure(s) with comments: RIGHT CARPAL TUNNEL RELEASE (Right) - ANESTHESIA:  IV REGIONAL FAB RELEASE A-1 PULLEY RIGHT THUMB (Right)  Patient Location: PACU  Anesthesia Type:MAC and Bier block  Level of Consciousness: awake, alert  and oriented  Airway and Oxygen Therapy: Patient Spontanous Breathing  Post-op Pain: none  Post-op Assessment: Post-op Vital signs reviewed              Post-op Vital Signs: Reviewed  Last Vitals:  Filed Vitals:   03/28/15 1100  BP: 108/44  Pulse: 78  Temp: 36.6 C  Resp: 14    Complications: No apparent anesthesia complications

## 2015-03-28 NOTE — Op Note (Signed)
Dictation Number [id number]

## 2015-03-28 NOTE — Discharge Instructions (Addendum)

## 2015-03-28 NOTE — Brief Op Note (Signed)
03/28/2015  9:28 AM  PATIENT:  Oran Rein  69 y.o. female  PRE-OPERATIVE DIAGNOSIS:  CARPAL TUNNEL CYNDROME/TRIGGER RIGHT THUMB  POST-OPERATIVE DIAGNOSIS:  CARPAL TUNNEL CYNDROME/TRIGGER RIGHT THUMB  PROCEDURE:  Procedure(s) with comments: RIGHT CARPAL TUNNEL RELEASE (Right) - ANESTHESIA:  IV REGIONAL FAB RELEASE A-1 PULLEY RIGHT THUMB (Right)  SURGEON:  Surgeon(s) and Role:    * Daryll Brod, MD - Primary  PHYSICIAN ASSISTANT:   ASSISTANTS: none   ANESTHESIA:   local and regional  EBL:  Total I/O In: 300 [I.V.:300] Out: -   BLOOD ADMINISTERED:none  DRAINS: none   LOCAL MEDICATIONS USED:  BUPIVICAINE   SPECIMEN:  No Specimen  DISPOSITION OF SPECIMEN:  N/A  COUNTS:  YES  TOURNIQUET:   Total Tourniquet Time Documented: Forearm (Right) - 22 minutes Total: Forearm (Right) - 22 minutes   DICTATION: .Other Dictation: Dictation Number [id number]  PLAN OF CARE: Discharge to home after PACU  PATIENT DISPOSITION:  PACU - hemodynamically stable.

## 2015-03-28 NOTE — Transfer of Care (Signed)
Immediate Anesthesia Transfer of Care Note  Patient: Sue Neal  Procedure(s) Performed: Procedure(s) with comments: RIGHT CARPAL TUNNEL RELEASE (Right) - ANESTHESIA:  IV REGIONAL FAB RELEASE A-1 PULLEY RIGHT THUMB (Right)  Patient Location: PACU  Anesthesia Type:Bier block  Level of Consciousness: awake and patient cooperative  Airway & Oxygen Therapy: Patient Spontanous Breathing and Patient connected to face mask oxygen  Post-op Assessment: Report given to RN and Post -op Vital signs reviewed and stable  Post vital signs: Reviewed and stable  Last Vitals:  Filed Vitals:   03/28/15 0807  BP: 110/44  Pulse: 103  Temp: 36.5 C  Resp: 18    Complications: No apparent anesthesia complications

## 2015-03-28 NOTE — Anesthesia Preprocedure Evaluation (Addendum)
Anesthesia Evaluation  Patient identified by MRN, date of birth, ID band Patient awake    Reviewed: Allergy & Precautions, NPO status , Patient's Chart, lab work & pertinent test results  Airway Mallampati: II  TM Distance: >3 FB Neck ROM: Full    Dental   Pulmonary former smoker,  breath sounds clear to auscultation        Cardiovascular hypertension, Rhythm:Regular Rate:Normal     Neuro/Psych negative neurological ROS     GI/Hepatic Neg liver ROS,   Endo/Other  Morbid obesity  Renal/GU Dialysis and ESRFRenal disease     Musculoskeletal   Abdominal   Peds  Hematology  (+) anemia ,   Anesthesia Other Findings   Reproductive/Obstetrics                            Anesthesia Physical Anesthesia Plan  ASA: III  Anesthesia Plan: MAC and Bier Block   Post-op Pain Management:    Induction: Intravenous  Airway Management Planned: Natural Airway and Simple Face Mask  Additional Equipment:   Intra-op Plan:   Post-operative Plan:   Informed Consent: I have reviewed the patients History and Physical, chart, labs and discussed the procedure including the risks, benefits and alternatives for the proposed anesthesia with the patient or authorized representative who has indicated his/her understanding and acceptance.     Plan Discussed with: CRNA  Anesthesia Plan Comments:         Anesthesia Quick Evaluation

## 2015-03-29 ENCOUNTER — Encounter (HOSPITAL_BASED_OUTPATIENT_CLINIC_OR_DEPARTMENT_OTHER): Payer: Self-pay | Admitting: Orthopedic Surgery

## 2015-03-29 NOTE — Op Note (Signed)
NAMEJOBANA, GRUSZKA                 ACCOUNT NO.:  [id number]  MEDICAL RECORD NO.:  [id number]  LOCATION:                               FACILITY:  [facility]  PHYSICIAN:  Daryll Brod, M.D.       DATE OF BIRTH:  01-03-1946  DATE OF PROCEDURE:  03/28/2015 DATE OF DISCHARGE:  03/28/2015                              OPERATIVE REPORT   POSTOPERATIVE DIAGNOSES: 1. Carpal tunnel syndrome, right hand. 2. Stenosing tenosynovitis, right thumb.  POSTOPERATIVE DIAGNOSES: 1. Carpal tunnel syndrome, right hand. 2. Stenosing tenosynovitis, right thumb.  OPERATIONS: 1. Release of A1 pulley, right thumb, with local infiltration. 2. Release of right carpal canal.  SURGEON:  Daryll Brod, M.D.  ANESTHESIA:  Forearm-based IV regional with additional block.  ANESTHESIOLOGIST:  Soledad Gerlach, MD  HISTORY:  The patient is a 69 year old female with a history of bilateral carpal tunnel syndrome and felt triggering of her right thumb. She has undergone carpal tunnel release on her left side, is admitted now for her right.  This has not responded to conservative treatment. She is aware of risks and complications including infection; recurrence of injury to arteries, nerves, tendons; incomplete relief of symptoms; dystrophy.  Nerve conductions are positive.  PROCEDURE IN DETAIL:  The patient was brought to the operating room where a forearm-based IV regional anesthetic was carried out without difficulty.  She had been seen in the preoperative area.  The extremity marked by both the patient and surgeon.  Antibiotic given.  Questions encouraged and answered to her satisfaction.  She was prepped using ChloraPrep in supine position with the right arm free.  A 3-minute dry time was allowed.  Time-out taken, confirming the patient and procedure. The trigger finger was addressed first and a transverse incision was made over the A1 pulley of the right thumb, carried down through subcutaneous tissue.   Neurovascular structures identified and protected with retractors.  A cyst was present at the A1 pulley.  This was deflated, incised through the A1 pulley on its radial aspect, taking care to protect neurovascular bundles and a node was present with a significant area of swelling in the flexor pollicis longus tendon proximal to the A1 pulley.  The oblique pulley was left intact.  The tenosynovial tissue proximally was separated with blunt dissection.  The thumb placed through a full range of motion, no further triggering was noted.  The wound was irrigated and closed with interrupted 4-0 nylon sutures.  Separate incision was then made longitudinally in the right palm, carried down through subcutaneous tissue.  Bleeders again electrocauterized with bipolar.  Palmar fascia was split.  The superficial palmar arch identified.  The flexor tendon to the ring and little finger were identified to the ulnar side of the median nerve. Carpal retinaculum was incised with sharp dissection.  Right angle and Sewall retractor were placed between skin and forearm fascia.  Fascia was released for approximately 2 cm proximal to the wrist crease under direct vision.  Canal was explored.  Area compression to the nerve with a very hyperemic nerve was immediately identified.  Motor branch entered into muscle.  No further lesions were identified.  The wound  was copiously irrigated with saline and the skin closed with interrupted 4-0 nylon sutures.  A local infiltration with 0.25% bupivacaine without epinephrine was given in both wounds, approximately 6 mL was used. Sterile compressive dressing was applied with the fingers and thumb free.  On deflation of the tourniquet, all fingers immediately pinked. She was taken to the recovery room for observation in satisfactory condition.  She will be discharged home to return to the [facility] in 1 week on Norco.    ______________________________ Daryll Brod, M.D.   ______________________________ Daryll Brod, M.D.    GK/MEDQ  D:  03/28/2015  T:  03/29/2015  Job:  [id number]

## 2015-09-24 DIAGNOSIS — D509 Iron deficiency anemia, unspecified: Secondary | ICD-10-CM | POA: Diagnosis not present

## 2015-09-24 DIAGNOSIS — N186 End stage renal disease: Secondary | ICD-10-CM | POA: Diagnosis not present

## 2015-09-24 DIAGNOSIS — N2581 Secondary hyperparathyroidism of renal origin: Secondary | ICD-10-CM | POA: Diagnosis not present

## 2015-09-26 DIAGNOSIS — N186 End stage renal disease: Secondary | ICD-10-CM | POA: Diagnosis not present

## 2015-09-26 DIAGNOSIS — D509 Iron deficiency anemia, unspecified: Secondary | ICD-10-CM | POA: Diagnosis not present

## 2015-09-26 DIAGNOSIS — N2581 Secondary hyperparathyroidism of renal origin: Secondary | ICD-10-CM | POA: Diagnosis not present

## 2015-10-01 DIAGNOSIS — N2581 Secondary hyperparathyroidism of renal origin: Secondary | ICD-10-CM | POA: Diagnosis not present

## 2015-10-01 DIAGNOSIS — N186 End stage renal disease: Secondary | ICD-10-CM | POA: Diagnosis not present

## 2015-10-01 DIAGNOSIS — D509 Iron deficiency anemia, unspecified: Secondary | ICD-10-CM | POA: Diagnosis not present

## 2015-10-03 DIAGNOSIS — N186 End stage renal disease: Secondary | ICD-10-CM | POA: Diagnosis not present

## 2015-10-03 DIAGNOSIS — N2581 Secondary hyperparathyroidism of renal origin: Secondary | ICD-10-CM | POA: Diagnosis not present

## 2015-10-03 DIAGNOSIS — D509 Iron deficiency anemia, unspecified: Secondary | ICD-10-CM | POA: Diagnosis not present

## 2015-10-04 DIAGNOSIS — M65312 Trigger thumb, left thumb: Secondary | ICD-10-CM | POA: Insufficient documentation

## 2015-10-04 DIAGNOSIS — M65341 Trigger finger, right ring finger: Secondary | ICD-10-CM | POA: Diagnosis not present

## 2015-10-04 DIAGNOSIS — M65332 Trigger finger, left middle finger: Secondary | ICD-10-CM | POA: Insufficient documentation

## 2015-10-04 DIAGNOSIS — M79644 Pain in right finger(s): Secondary | ICD-10-CM | POA: Diagnosis not present

## 2015-10-05 DIAGNOSIS — N186 End stage renal disease: Secondary | ICD-10-CM | POA: Diagnosis not present

## 2015-10-05 DIAGNOSIS — D509 Iron deficiency anemia, unspecified: Secondary | ICD-10-CM | POA: Diagnosis not present

## 2015-10-05 DIAGNOSIS — N2581 Secondary hyperparathyroidism of renal origin: Secondary | ICD-10-CM | POA: Diagnosis not present

## 2015-10-10 DIAGNOSIS — D509 Iron deficiency anemia, unspecified: Secondary | ICD-10-CM | POA: Diagnosis not present

## 2015-10-10 DIAGNOSIS — N2581 Secondary hyperparathyroidism of renal origin: Secondary | ICD-10-CM | POA: Diagnosis not present

## 2015-10-10 DIAGNOSIS — N186 End stage renal disease: Secondary | ICD-10-CM | POA: Diagnosis not present

## 2015-10-12 DIAGNOSIS — N186 End stage renal disease: Secondary | ICD-10-CM | POA: Diagnosis not present

## 2015-10-12 DIAGNOSIS — D509 Iron deficiency anemia, unspecified: Secondary | ICD-10-CM | POA: Diagnosis not present

## 2015-10-12 DIAGNOSIS — N2581 Secondary hyperparathyroidism of renal origin: Secondary | ICD-10-CM | POA: Diagnosis not present

## 2015-10-15 DIAGNOSIS — N2581 Secondary hyperparathyroidism of renal origin: Secondary | ICD-10-CM | POA: Diagnosis not present

## 2015-10-15 DIAGNOSIS — N186 End stage renal disease: Secondary | ICD-10-CM | POA: Diagnosis not present

## 2015-10-15 DIAGNOSIS — D509 Iron deficiency anemia, unspecified: Secondary | ICD-10-CM | POA: Diagnosis not present

## 2015-10-17 DIAGNOSIS — D509 Iron deficiency anemia, unspecified: Secondary | ICD-10-CM | POA: Diagnosis not present

## 2015-10-17 DIAGNOSIS — N186 End stage renal disease: Secondary | ICD-10-CM | POA: Diagnosis not present

## 2015-10-17 DIAGNOSIS — N2581 Secondary hyperparathyroidism of renal origin: Secondary | ICD-10-CM | POA: Diagnosis not present

## 2015-10-18 DIAGNOSIS — M65312 Trigger thumb, left thumb: Secondary | ICD-10-CM | POA: Diagnosis not present

## 2015-10-19 DIAGNOSIS — N186 End stage renal disease: Secondary | ICD-10-CM | POA: Diagnosis not present

## 2015-10-19 DIAGNOSIS — N2581 Secondary hyperparathyroidism of renal origin: Secondary | ICD-10-CM | POA: Diagnosis not present

## 2015-10-19 DIAGNOSIS — D509 Iron deficiency anemia, unspecified: Secondary | ICD-10-CM | POA: Diagnosis not present

## 2015-10-22 DIAGNOSIS — N2581 Secondary hyperparathyroidism of renal origin: Secondary | ICD-10-CM | POA: Diagnosis not present

## 2015-10-22 DIAGNOSIS — Z992 Dependence on renal dialysis: Secondary | ICD-10-CM | POA: Diagnosis not present

## 2015-10-22 DIAGNOSIS — N186 End stage renal disease: Secondary | ICD-10-CM | POA: Diagnosis not present

## 2015-10-22 DIAGNOSIS — Q612 Polycystic kidney, adult type: Secondary | ICD-10-CM | POA: Diagnosis not present

## 2015-10-22 DIAGNOSIS — D509 Iron deficiency anemia, unspecified: Secondary | ICD-10-CM | POA: Diagnosis not present

## 2015-10-26 DIAGNOSIS — D509 Iron deficiency anemia, unspecified: Secondary | ICD-10-CM | POA: Diagnosis not present

## 2015-10-26 DIAGNOSIS — N186 End stage renal disease: Secondary | ICD-10-CM | POA: Diagnosis not present

## 2015-10-26 DIAGNOSIS — N2581 Secondary hyperparathyroidism of renal origin: Secondary | ICD-10-CM | POA: Diagnosis not present

## 2015-10-29 DIAGNOSIS — N186 End stage renal disease: Secondary | ICD-10-CM | POA: Diagnosis not present

## 2015-10-29 DIAGNOSIS — D509 Iron deficiency anemia, unspecified: Secondary | ICD-10-CM | POA: Diagnosis not present

## 2015-10-29 DIAGNOSIS — N2581 Secondary hyperparathyroidism of renal origin: Secondary | ICD-10-CM | POA: Diagnosis not present

## 2015-10-31 DIAGNOSIS — N2581 Secondary hyperparathyroidism of renal origin: Secondary | ICD-10-CM | POA: Diagnosis not present

## 2015-10-31 DIAGNOSIS — N186 End stage renal disease: Secondary | ICD-10-CM | POA: Diagnosis not present

## 2015-10-31 DIAGNOSIS — D509 Iron deficiency anemia, unspecified: Secondary | ICD-10-CM | POA: Diagnosis not present

## 2015-11-02 DIAGNOSIS — N2581 Secondary hyperparathyroidism of renal origin: Secondary | ICD-10-CM | POA: Diagnosis not present

## 2015-11-02 DIAGNOSIS — N186 End stage renal disease: Secondary | ICD-10-CM | POA: Diagnosis not present

## 2015-11-02 DIAGNOSIS — D509 Iron deficiency anemia, unspecified: Secondary | ICD-10-CM | POA: Diagnosis not present

## 2015-11-05 DIAGNOSIS — N186 End stage renal disease: Secondary | ICD-10-CM | POA: Diagnosis not present

## 2015-11-05 DIAGNOSIS — N2581 Secondary hyperparathyroidism of renal origin: Secondary | ICD-10-CM | POA: Diagnosis not present

## 2015-11-05 DIAGNOSIS — D509 Iron deficiency anemia, unspecified: Secondary | ICD-10-CM | POA: Diagnosis not present

## 2015-11-07 DIAGNOSIS — N186 End stage renal disease: Secondary | ICD-10-CM | POA: Diagnosis not present

## 2015-11-07 DIAGNOSIS — D509 Iron deficiency anemia, unspecified: Secondary | ICD-10-CM | POA: Diagnosis not present

## 2015-11-07 DIAGNOSIS — N2581 Secondary hyperparathyroidism of renal origin: Secondary | ICD-10-CM | POA: Diagnosis not present

## 2015-11-09 DIAGNOSIS — D509 Iron deficiency anemia, unspecified: Secondary | ICD-10-CM | POA: Diagnosis not present

## 2015-11-09 DIAGNOSIS — N186 End stage renal disease: Secondary | ICD-10-CM | POA: Diagnosis not present

## 2015-11-09 DIAGNOSIS — N2581 Secondary hyperparathyroidism of renal origin: Secondary | ICD-10-CM | POA: Diagnosis not present

## 2015-11-12 DIAGNOSIS — N2581 Secondary hyperparathyroidism of renal origin: Secondary | ICD-10-CM | POA: Diagnosis not present

## 2015-11-12 DIAGNOSIS — N186 End stage renal disease: Secondary | ICD-10-CM | POA: Diagnosis not present

## 2015-11-12 DIAGNOSIS — D509 Iron deficiency anemia, unspecified: Secondary | ICD-10-CM | POA: Diagnosis not present

## 2015-11-14 DIAGNOSIS — N186 End stage renal disease: Secondary | ICD-10-CM | POA: Diagnosis not present

## 2015-11-14 DIAGNOSIS — N2581 Secondary hyperparathyroidism of renal origin: Secondary | ICD-10-CM | POA: Diagnosis not present

## 2015-11-14 DIAGNOSIS — D509 Iron deficiency anemia, unspecified: Secondary | ICD-10-CM | POA: Diagnosis not present

## 2015-11-16 DIAGNOSIS — N2581 Secondary hyperparathyroidism of renal origin: Secondary | ICD-10-CM | POA: Diagnosis not present

## 2015-11-16 DIAGNOSIS — N186 End stage renal disease: Secondary | ICD-10-CM | POA: Diagnosis not present

## 2015-11-16 DIAGNOSIS — D509 Iron deficiency anemia, unspecified: Secondary | ICD-10-CM | POA: Diagnosis not present

## 2015-11-19 DIAGNOSIS — N186 End stage renal disease: Secondary | ICD-10-CM | POA: Diagnosis not present

## 2015-11-19 DIAGNOSIS — N2581 Secondary hyperparathyroidism of renal origin: Secondary | ICD-10-CM | POA: Diagnosis not present

## 2015-11-19 DIAGNOSIS — D509 Iron deficiency anemia, unspecified: Secondary | ICD-10-CM | POA: Diagnosis not present

## 2015-11-19 DIAGNOSIS — Z992 Dependence on renal dialysis: Secondary | ICD-10-CM | POA: Diagnosis not present

## 2015-11-19 DIAGNOSIS — Q612 Polycystic kidney, adult type: Secondary | ICD-10-CM | POA: Diagnosis not present

## 2015-11-21 DIAGNOSIS — D509 Iron deficiency anemia, unspecified: Secondary | ICD-10-CM | POA: Diagnosis not present

## 2015-11-21 DIAGNOSIS — N2581 Secondary hyperparathyroidism of renal origin: Secondary | ICD-10-CM | POA: Diagnosis not present

## 2015-11-21 DIAGNOSIS — N186 End stage renal disease: Secondary | ICD-10-CM | POA: Diagnosis not present

## 2015-11-23 DIAGNOSIS — N2581 Secondary hyperparathyroidism of renal origin: Secondary | ICD-10-CM | POA: Diagnosis not present

## 2015-11-23 DIAGNOSIS — D509 Iron deficiency anemia, unspecified: Secondary | ICD-10-CM | POA: Diagnosis not present

## 2015-11-23 DIAGNOSIS — N186 End stage renal disease: Secondary | ICD-10-CM | POA: Diagnosis not present

## 2015-11-26 DIAGNOSIS — D509 Iron deficiency anemia, unspecified: Secondary | ICD-10-CM | POA: Diagnosis not present

## 2015-11-26 DIAGNOSIS — N2581 Secondary hyperparathyroidism of renal origin: Secondary | ICD-10-CM | POA: Diagnosis not present

## 2015-11-26 DIAGNOSIS — N186 End stage renal disease: Secondary | ICD-10-CM | POA: Diagnosis not present

## 2015-11-28 DIAGNOSIS — D509 Iron deficiency anemia, unspecified: Secondary | ICD-10-CM | POA: Diagnosis not present

## 2015-11-28 DIAGNOSIS — N2581 Secondary hyperparathyroidism of renal origin: Secondary | ICD-10-CM | POA: Diagnosis not present

## 2015-11-28 DIAGNOSIS — N186 End stage renal disease: Secondary | ICD-10-CM | POA: Diagnosis not present

## 2015-11-30 DIAGNOSIS — N186 End stage renal disease: Secondary | ICD-10-CM | POA: Diagnosis not present

## 2015-11-30 DIAGNOSIS — N2581 Secondary hyperparathyroidism of renal origin: Secondary | ICD-10-CM | POA: Diagnosis not present

## 2015-11-30 DIAGNOSIS — D509 Iron deficiency anemia, unspecified: Secondary | ICD-10-CM | POA: Diagnosis not present

## 2015-12-03 DIAGNOSIS — D509 Iron deficiency anemia, unspecified: Secondary | ICD-10-CM | POA: Diagnosis not present

## 2015-12-03 DIAGNOSIS — N186 End stage renal disease: Secondary | ICD-10-CM | POA: Diagnosis not present

## 2015-12-03 DIAGNOSIS — N2581 Secondary hyperparathyroidism of renal origin: Secondary | ICD-10-CM | POA: Diagnosis not present

## 2015-12-05 DIAGNOSIS — N186 End stage renal disease: Secondary | ICD-10-CM | POA: Diagnosis not present

## 2015-12-05 DIAGNOSIS — D509 Iron deficiency anemia, unspecified: Secondary | ICD-10-CM | POA: Diagnosis not present

## 2015-12-05 DIAGNOSIS — N2581 Secondary hyperparathyroidism of renal origin: Secondary | ICD-10-CM | POA: Diagnosis not present

## 2015-12-07 DIAGNOSIS — N2581 Secondary hyperparathyroidism of renal origin: Secondary | ICD-10-CM | POA: Diagnosis not present

## 2015-12-07 DIAGNOSIS — D509 Iron deficiency anemia, unspecified: Secondary | ICD-10-CM | POA: Diagnosis not present

## 2015-12-07 DIAGNOSIS — N186 End stage renal disease: Secondary | ICD-10-CM | POA: Diagnosis not present

## 2015-12-10 DIAGNOSIS — N186 End stage renal disease: Secondary | ICD-10-CM | POA: Diagnosis not present

## 2015-12-10 DIAGNOSIS — N2581 Secondary hyperparathyroidism of renal origin: Secondary | ICD-10-CM | POA: Diagnosis not present

## 2015-12-10 DIAGNOSIS — D509 Iron deficiency anemia, unspecified: Secondary | ICD-10-CM | POA: Diagnosis not present

## 2015-12-12 DIAGNOSIS — N186 End stage renal disease: Secondary | ICD-10-CM | POA: Diagnosis not present

## 2015-12-12 DIAGNOSIS — N2581 Secondary hyperparathyroidism of renal origin: Secondary | ICD-10-CM | POA: Diagnosis not present

## 2015-12-12 DIAGNOSIS — D509 Iron deficiency anemia, unspecified: Secondary | ICD-10-CM | POA: Diagnosis not present

## 2015-12-14 DIAGNOSIS — D509 Iron deficiency anemia, unspecified: Secondary | ICD-10-CM | POA: Diagnosis not present

## 2015-12-14 DIAGNOSIS — N2581 Secondary hyperparathyroidism of renal origin: Secondary | ICD-10-CM | POA: Diagnosis not present

## 2015-12-14 DIAGNOSIS — N186 End stage renal disease: Secondary | ICD-10-CM | POA: Diagnosis not present

## 2015-12-17 DIAGNOSIS — N186 End stage renal disease: Secondary | ICD-10-CM | POA: Diagnosis not present

## 2015-12-17 DIAGNOSIS — N2581 Secondary hyperparathyroidism of renal origin: Secondary | ICD-10-CM | POA: Diagnosis not present

## 2015-12-17 DIAGNOSIS — D509 Iron deficiency anemia, unspecified: Secondary | ICD-10-CM | POA: Diagnosis not present

## 2015-12-19 DIAGNOSIS — N186 End stage renal disease: Secondary | ICD-10-CM | POA: Diagnosis not present

## 2015-12-19 DIAGNOSIS — D509 Iron deficiency anemia, unspecified: Secondary | ICD-10-CM | POA: Diagnosis not present

## 2015-12-19 DIAGNOSIS — N2581 Secondary hyperparathyroidism of renal origin: Secondary | ICD-10-CM | POA: Diagnosis not present

## 2015-12-20 DIAGNOSIS — Q612 Polycystic kidney, adult type: Secondary | ICD-10-CM | POA: Diagnosis not present

## 2015-12-20 DIAGNOSIS — N186 End stage renal disease: Secondary | ICD-10-CM | POA: Diagnosis not present

## 2015-12-20 DIAGNOSIS — Z992 Dependence on renal dialysis: Secondary | ICD-10-CM | POA: Diagnosis not present

## 2015-12-21 DIAGNOSIS — N186 End stage renal disease: Secondary | ICD-10-CM | POA: Diagnosis not present

## 2015-12-24 DIAGNOSIS — N186 End stage renal disease: Secondary | ICD-10-CM | POA: Diagnosis not present

## 2015-12-26 DIAGNOSIS — N186 End stage renal disease: Secondary | ICD-10-CM | POA: Diagnosis not present

## 2015-12-28 DIAGNOSIS — N186 End stage renal disease: Secondary | ICD-10-CM | POA: Diagnosis not present

## 2015-12-31 DIAGNOSIS — N186 End stage renal disease: Secondary | ICD-10-CM | POA: Diagnosis not present

## 2016-01-02 DIAGNOSIS — N186 End stage renal disease: Secondary | ICD-10-CM | POA: Diagnosis not present

## 2016-01-04 DIAGNOSIS — N186 End stage renal disease: Secondary | ICD-10-CM | POA: Diagnosis not present

## 2016-01-07 DIAGNOSIS — N186 End stage renal disease: Secondary | ICD-10-CM | POA: Diagnosis not present

## 2016-01-08 ENCOUNTER — Encounter: Payer: Self-pay | Admitting: Vascular Surgery

## 2016-01-09 DIAGNOSIS — N186 End stage renal disease: Secondary | ICD-10-CM | POA: Diagnosis not present

## 2016-01-11 DIAGNOSIS — N186 End stage renal disease: Secondary | ICD-10-CM | POA: Diagnosis not present

## 2016-01-14 ENCOUNTER — Encounter: Payer: Self-pay | Admitting: Gastroenterology

## 2016-01-14 ENCOUNTER — Other Ambulatory Visit: Payer: Self-pay | Admitting: *Deleted

## 2016-01-14 DIAGNOSIS — I770 Arteriovenous fistula, acquired: Secondary | ICD-10-CM

## 2016-01-14 DIAGNOSIS — N186 End stage renal disease: Secondary | ICD-10-CM | POA: Diagnosis not present

## 2016-01-15 ENCOUNTER — Encounter: Payer: Self-pay | Admitting: Vascular Surgery

## 2016-01-15 ENCOUNTER — Ambulatory Visit (INDEPENDENT_AMBULATORY_CARE_PROVIDER_SITE_OTHER): Payer: Medicare Other | Admitting: Vascular Surgery

## 2016-01-15 ENCOUNTER — Ambulatory Visit (HOSPITAL_COMMUNITY)
Admission: RE | Admit: 2016-01-15 | Discharge: 2016-01-15 | Disposition: A | Discharge status: Home / Self Care | Payer: Medicare Other | Source: Ambulatory Visit | Attending: Vascular Surgery | Admitting: Vascular Surgery

## 2016-01-15 VITALS — BP 108/57 | HR 83 | Temp 98.2°F | Resp 18 | Ht 63.0 in | Wt 193.8 lb

## 2016-01-15 DIAGNOSIS — N186 End stage renal disease: Secondary | ICD-10-CM | POA: Diagnosis not present

## 2016-01-15 DIAGNOSIS — I770 Arteriovenous fistula, acquired: Secondary | ICD-10-CM | POA: Diagnosis not present

## 2016-01-15 DIAGNOSIS — I129 Hypertensive chronic kidney disease with stage 1 through stage 4 chronic kidney disease, or unspecified chronic kidney disease: Secondary | ICD-10-CM | POA: Diagnosis not present

## 2016-01-15 DIAGNOSIS — N189 Chronic kidney disease, unspecified: Secondary | ICD-10-CM | POA: Insufficient documentation

## 2016-01-15 NOTE — Progress Notes (Signed)
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 female, who is referred by Dr. 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 She has had a left Frey AV graft for many years. I cannot find the original operative report. Nadara Mustard this is been revised multiple times including replacement of the venous half of the graft. 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 She's had an aneurysmal segment on the lateral aspect of the graft which she feels has gotten slightly larger. For this reason she was sent for evaluation for possible revision.  She dialyzes on Tuesdays Thursdays and Saturdays.  Past Medical History  Diagnosis Date  . Anemia   . Hyperparathyroidism, secondary ([place])   . Back pain, chronic   . Uterine fibroid   . Chronic kidney disease   . 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 Hypertension     off meds now    Family History  Problem Relation Age of Onset  . Cancer Mother     COLON    SOCIAL HISTORY: Social History   Social History  . Marital Status: Single    Spouse Name: N/A  . Number of Children: N/A  . Years of Education: N/A   Occupational History  . Not on file.   Social History Main Topics  . Smoking status: Former Smoker -- 0.10 packs/day    Types: Cigarettes    Quit date: 02/03/1978  . Smokeless tobacco: Never Used  . Alcohol Use: No  . Drug Use: No  . Sexual Activity: Not on file   Other Topics Concern  . Not on file   Social History Narrative    No Known Allergies  Current Outpatient Prescriptions  Medication Sig Dispense Refill  . cinacalcet (SENSIPAR) 90 MG tablet Take 90 mg by mouth  daily.    . multivitamin (RENA-VIT) TABS tablet Take 1 tablet by mouth daily.    Marland Kitchen PROTEIN PO Take by mouth as needed. 2 teaspoons occasionally    . sevelamer (RENVELA) 800 MG tablet Take 800 mg by mouth 3 (three) times daily with meals.    Marland Kitchen BIOTIN 5000 PO Take 1 tablet by mouth daily. Reported on 01/15/2016    . HYDROcodone-acetaminophen (NORCO) 5-325 MG per tablet Take 1 tablet by mouth every 6 (six) hours as needed for moderate pain. (Patient not taking: Reported on 01/15/2016) 30 tablet 0  . hydrocortisone 1 % ointment Apply topically 2 (two) times daily. (Patient not taking: Reported on 01/15/2016) 30 g 0  . traMADol (ULTRAM) 50 MG tablet Take 50 mg by mouth every 6 (six) hours as needed. Reported on 01/15/2016     No current facility-administered medications for this visit.    REVIEW OF SYSTEMS:  [X]  denotes positive finding, [ ]  denotes negative finding Cardiac  Comments:  Chest pain or chest pressure:    Shortness of breath upon exertion:    Short of breath when lying flat:    Irregular heart rhythm:        Vascular    Pain in calf, thigh, or hip brought on by ambulation: X   Pain in feet at  night that wakes you up from your sleep:     Blood clot in your veins:    Leg swelling:         Pulmonary    Oxygen at home:    Productive cough:     Wheezing:         Neurologic    Sudden weakness in arms or legs:  X   Sudden numbness in arms or legs:     Sudden onset of difficulty speaking or slurred speech:    Temporary loss of vision in one eye:     Problems with dizziness:         Gastrointestinal    Blood in stool:     Vomited blood:         Genitourinary    Burning when urinating:     Blood in urine:        Psychiatric    Major depression:         Hematologic    Bleeding problems:    Problems with blood clotting too easily:        Skin    Rashes or ulcers:        Constitutional    Fever or chills:      PHYSICAL EXAM: Filed Vitals:   01/15/16 1231  BP:  108/57  Pulse: 83  Temp: 98.2 F (36.8 C)  TempSrc: Oral  Resp: 18  Height: 5\' 3"  (1.6 m)  Weight: 193 lb 12.8 oz (87.907 kg)  SpO2: 100%    GENERAL: The patient is a well-nourished female, in no acute distress. The vital signs are documented above. CARDIAC: There is a regular rate and rhythm.  VASCULAR: her graft in the left thigh is pulsatile. The lateral aspect of the graft has an aneurysmal segment. She has had multiple revisions of her left thigh AV graft. PULMONARY: There is good air exchange bilaterally without wheezing or rales. ABDOMEN: Soft and non-tender with normal pitched bowel sounds.  MUSCULOSKELETAL: There are no major deformities or cyanosis. NEUROLOGIC: No focal weakness or paresthesias are detected. SKIN: There are no ulcers or rashes noted. PSYCHIATRIC: The patient has a normal affect.  DATA:   DUPLEX LEFT THIGH AV GRAFT: I have independently interpreted the duplex of the left thigh AV graft. There is an aneurysm along the arterial limb of the graft, laterally, which measures 2.5 cm in maximum diameter. There is some thrombus within the aneurysm. There are also some elevated velocities at the venous anastomosis.  MEDICAL ISSUES:  END-STAGE RENAL DISEASE: This patient has an aneurysmal segment on the lateral aspect of her graft. This has gradually enlarged and for this reason I would recommend elective revision of this. As she dialyzes on Tuesdays Thursdays and Saturdays I can only do this on Fridays. I have added on multiple Friday offices and therefore I'm unable to perform the procedure until May 19. I encouraged her to let one of my partners do this sooner however she would prefer to wait. I explained that there is a risk of graft thrombosis in the interim. In addition I explained that there is also evidence of an outflow stenosis as seen on duplex and also given the pulsatility of her graft. I have recommended that we replace the arterial half of the graft and  consider revising the venous end at the same time if I'm concerned about eminent graft thrombosis. I've also explained that this graft is quite degenerative and is been in for some time and ultimately  we may need to consider new access in the future.  [name] [facility] [place]: [phone]

## 2016-01-18 DIAGNOSIS — N186 End stage renal disease: Secondary | ICD-10-CM | POA: Diagnosis not present

## 2016-01-19 DIAGNOSIS — Q612 Polycystic kidney, adult type: Secondary | ICD-10-CM | POA: Diagnosis not present

## 2016-01-19 DIAGNOSIS — N186 End stage renal disease: Secondary | ICD-10-CM | POA: Diagnosis not present

## 2016-01-19 DIAGNOSIS — Z992 Dependence on renal dialysis: Secondary | ICD-10-CM | POA: Diagnosis not present

## 2016-01-20 DIAGNOSIS — E875 Hyperkalemia: Secondary | ICD-10-CM | POA: Insufficient documentation

## 2016-01-21 DIAGNOSIS — N186 End stage renal disease: Secondary | ICD-10-CM | POA: Diagnosis not present

## 2016-01-21 DIAGNOSIS — E875 Hyperkalemia: Secondary | ICD-10-CM | POA: Diagnosis not present

## 2016-01-22 ENCOUNTER — Other Ambulatory Visit: Payer: Self-pay

## 2016-01-23 DIAGNOSIS — E875 Hyperkalemia: Secondary | ICD-10-CM | POA: Diagnosis not present

## 2016-01-23 DIAGNOSIS — N186 End stage renal disease: Secondary | ICD-10-CM | POA: Diagnosis not present

## 2016-01-25 DIAGNOSIS — E875 Hyperkalemia: Secondary | ICD-10-CM | POA: Diagnosis not present

## 2016-01-25 DIAGNOSIS — N186 End stage renal disease: Secondary | ICD-10-CM | POA: Diagnosis not present

## 2016-01-28 DIAGNOSIS — N186 End stage renal disease: Secondary | ICD-10-CM | POA: Diagnosis not present

## 2016-01-28 DIAGNOSIS — E875 Hyperkalemia: Secondary | ICD-10-CM | POA: Diagnosis not present

## 2016-01-30 DIAGNOSIS — N186 End stage renal disease: Secondary | ICD-10-CM | POA: Diagnosis not present

## 2016-01-30 DIAGNOSIS — E875 Hyperkalemia: Secondary | ICD-10-CM | POA: Diagnosis not present

## 2016-02-01 DIAGNOSIS — E875 Hyperkalemia: Secondary | ICD-10-CM | POA: Diagnosis not present

## 2016-02-01 DIAGNOSIS — N186 End stage renal disease: Secondary | ICD-10-CM | POA: Diagnosis not present

## 2016-02-04 DIAGNOSIS — E875 Hyperkalemia: Secondary | ICD-10-CM | POA: Diagnosis not present

## 2016-02-04 DIAGNOSIS — N186 End stage renal disease: Secondary | ICD-10-CM | POA: Diagnosis not present

## 2016-02-05 ENCOUNTER — Encounter (HOSPITAL_COMMUNITY): Payer: Self-pay | Admitting: *Deleted

## 2016-02-05 NOTE — Progress Notes (Signed)
Pt denies SOB, chest pain, and being under the care of a cardiologist. Pt denies having a cardiac cath. Pt denies having a chest x ray and EKG within the last year. Pt made aware to stop otc vitamins, fish oil, herbal medications and NSAID's. Pt verbalized understanding of all pre-op instructions.

## 2016-02-06 DIAGNOSIS — E875 Hyperkalemia: Secondary | ICD-10-CM | POA: Diagnosis not present

## 2016-02-06 DIAGNOSIS — N186 End stage renal disease: Secondary | ICD-10-CM | POA: Diagnosis not present

## 2016-02-06 MED ORDER — SODIUM CHLORIDE 0.9 % IV SOLN
INTRAVENOUS | Status: DC
  Administered 2016-02-07: 14:00:00 via INTRAVENOUS
  Administered 2016-02-07: 25 mL/h via INTRAVENOUS

## 2016-02-06 MED ORDER — DEXTROSE 5 % IV SOLN
1.5000 g | INTRAVENOUS | Status: AC
  Administered 2016-02-07: 1.5 g via INTRAVENOUS
  Filled 2016-02-06: qty 1.5

## 2016-02-07 ENCOUNTER — Encounter (HOSPITAL_COMMUNITY): Payer: Self-pay | Admitting: *Deleted

## 2016-02-07 ENCOUNTER — Ambulatory Visit (HOSPITAL_COMMUNITY): Payer: Medicare Other | Admitting: Anesthesiology

## 2016-02-07 ENCOUNTER — Encounter (HOSPITAL_COMMUNITY)
Admission: RE | Disposition: A | Discharge status: Home / Self Care | Payer: Self-pay | Source: Ambulatory Visit | Attending: Vascular Surgery

## 2016-02-07 ENCOUNTER — Observation Stay (HOSPITAL_COMMUNITY)
Admission: RE | Admit: 2016-02-07 | Discharge: 2016-02-08 | Disposition: A | Discharge status: Home / Self Care | Payer: Medicare Other | Source: Ambulatory Visit | Attending: Vascular Surgery | Admitting: Vascular Surgery

## 2016-02-07 DIAGNOSIS — M549 Dorsalgia, unspecified: Secondary | ICD-10-CM | POA: Diagnosis not present

## 2016-02-07 DIAGNOSIS — Z6836 Body mass index (BMI) 36.0-36.9, adult: Secondary | ICD-10-CM | POA: Diagnosis not present

## 2016-02-07 DIAGNOSIS — I729 Aneurysm of unspecified site: Secondary | ICD-10-CM | POA: Diagnosis not present

## 2016-02-07 DIAGNOSIS — Z87891 Personal history of nicotine dependence: Secondary | ICD-10-CM | POA: Diagnosis not present

## 2016-02-07 DIAGNOSIS — Z992 Dependence on renal dialysis: Secondary | ICD-10-CM | POA: Diagnosis not present

## 2016-02-07 DIAGNOSIS — Y831 Surgical operation with implant of artificial internal device as the cause of abnormal reaction of the patient, or of later complication, without mention of misadventure at the time of the procedure: Secondary | ICD-10-CM | POA: Insufficient documentation

## 2016-02-07 DIAGNOSIS — I120 Hypertensive chronic kidney disease with stage 5 chronic kidney disease or end stage renal disease: Secondary | ICD-10-CM | POA: Diagnosis not present

## 2016-02-07 DIAGNOSIS — T82898A Other specified complication of vascular prosthetic devices, implants and grafts, initial encounter: Principal | ICD-10-CM | POA: Insufficient documentation

## 2016-02-07 DIAGNOSIS — D649 Anemia, unspecified: Secondary | ICD-10-CM | POA: Diagnosis not present

## 2016-02-07 DIAGNOSIS — N186 End stage renal disease: Secondary | ICD-10-CM | POA: Diagnosis not present

## 2016-02-07 DIAGNOSIS — Z79899 Other long term (current) drug therapy: Secondary | ICD-10-CM | POA: Insufficient documentation

## 2016-02-07 HISTORY — DX: Unspecified osteoarthritis, unspecified site: M19.90

## 2016-02-07 HISTORY — DX: Dependence on renal dialysis: N18.6

## 2016-02-07 HISTORY — DX: Other complications of anesthesia, initial encounter: T88.59XA

## 2016-02-07 HISTORY — DX: Dependence on renal dialysis: Z99.2

## 2016-02-07 HISTORY — DX: Adverse effect of unspecified anesthetic, initial encounter: T41.45XA

## 2016-02-07 HISTORY — PX: REVISION OF ARTERIOVENOUS GORETEX GRAFT: SHX6073

## 2016-02-07 HISTORY — PX: THROMBECTOMY / ARTERIOVENOUS GRAFT REVISION: SUR1351

## 2016-02-07 LAB — COMPREHENSIVE METABOLIC PANEL
ALBUMIN: 3.2 g/dL — AB (ref 3.5–5.0)
ALT: 12 U/L — AB (ref 14–54)
AST: 18 U/L (ref 15–41)
Alkaline Phosphatase: 44 U/L (ref 38–126)
Anion gap: 12 (ref 5–15)
BUN: 15 mg/dL (ref 6–20)
CALCIUM: 9.7 mg/dL (ref 8.9–10.3)
CHLORIDE: 98 mmol/L — AB (ref 101–111)
CO2: 34 mmol/L — ABNORMAL HIGH (ref 22–32)
Creatinine, Ser: 7.26 mg/dL — ABNORMAL HIGH (ref 0.44–1.00)
GFR calc Af Amer: 6 mL/min — ABNORMAL LOW (ref >60)
GFR calc non Af Amer: 5 mL/min — ABNORMAL LOW (ref >60)
GLUCOSE: 126 mg/dL — AB (ref 65–99)
POTASSIUM: 4.1 mmol/L (ref 3.5–5.1)
SODIUM: 144 mmol/L (ref 135–145)
TOTAL PROTEIN: 6.2 g/dL — AB (ref 6.5–8.1)
Total Bilirubin: 0.4 mg/dL (ref 0.3–1.2)

## 2016-02-07 LAB — CBC
HEMATOCRIT: 43.3 % (ref 36.0–46.0)
HEMOGLOBIN: 12.9 g/dL (ref 12.0–15.0)
MCH: 29.9 pg (ref 26.0–34.0)
MCHC: 29.8 g/dL — AB (ref 30.0–36.0)
MCV: 100.5 fL — ABNORMAL HIGH (ref 78.0–100.0)
Platelets: 65 10*3/uL — ABNORMAL LOW (ref 150–400)
RBC: 4.31 MIL/uL (ref 3.87–5.11)
RDW: 14.8 % (ref 11.5–15.5)
WBC: 6.2 10*3/uL (ref 4.0–10.5)

## 2016-02-07 LAB — POCT I-STAT 4, (NA,K, GLUC, HGB,HCT)
Glucose, Bld: 72 mg/dL (ref 65–99)
HEMATOCRIT: 48 % — AB (ref 36.0–46.0)
HEMOGLOBIN: 16.3 g/dL — AB (ref 12.0–15.0)
Potassium: 4.2 mmol/L (ref 3.5–5.1)
Sodium: 143 mmol/L (ref 135–145)

## 2016-02-07 SURGERY — REVISION OF ARTERIOVENOUS GORETEX GRAFT
Anesthesia: General | Site: Thigh | Laterality: Left

## 2016-02-07 MED ORDER — BISACODYL 5 MG PO TBEC
5.0000 mg | DELAYED_RELEASE_TABLET | Freq: Every day | ORAL | Status: DC | PRN
Start: 2016-02-07 — End: 2016-02-08

## 2016-02-07 MED ORDER — PROPOFOL 10 MG/ML IV BOLUS
INTRAVENOUS | Status: AC
  Filled 2016-02-07: qty 40

## 2016-02-07 MED ORDER — SODIUM CHLORIDE 0.9 % IV SOLN
10.0000 mg | INTRAVENOUS | Status: DC | PRN
  Administered 2016-02-07: 30 ug/min via INTRAVENOUS

## 2016-02-07 MED ORDER — PROTAMINE SULFATE 10 MG/ML IV SOLN
INTRAVENOUS | Status: AC
  Filled 2016-02-07: qty 5

## 2016-02-07 MED ORDER — HYDRALAZINE HCL 20 MG/ML IJ SOLN: 5.0000 mg | INTRAMUSCULAR | Status: DC | PRN

## 2016-02-07 MED ORDER — PHENYLEPHRINE HCL 10 MG/ML IJ SOLN
INTRAMUSCULAR | Status: DC | PRN
  Administered 2016-02-07 (×7): 80 ug via INTRAVENOUS

## 2016-02-07 MED ORDER — CHLORHEXIDINE GLUCONATE CLOTH 2 % EX PADS: 6.0000 | MEDICATED_PAD | Freq: Once | CUTANEOUS | Status: DC

## 2016-02-07 MED ORDER — POTASSIUM CHLORIDE CRYS ER 20 MEQ PO TBCR: 20.0000 meq | EXTENDED_RELEASE_TABLET | Freq: Once | ORAL | Status: DC

## 2016-02-07 MED ORDER — FENTANYL CITRATE (PF) 250 MCG/5ML IJ SOLN
INTRAMUSCULAR | Status: AC
Start: 2016-02-07 — End: 2016-02-07
  Filled 2016-02-07: qty 5

## 2016-02-07 MED ORDER — ONDANSETRON HCL 4 MG/2ML IJ SOLN
INTRAMUSCULAR | Status: DC | PRN
  Administered 2016-02-07: 4 mg via INTRAVENOUS

## 2016-02-07 MED ORDER — SEVELAMER CARBONATE 800 MG PO TABS: 4000.0000 mg | ORAL_TABLET | Freq: Three times a day (TID) | ORAL | Status: DC

## 2016-02-07 MED ORDER — THROMBIN 20000 UNITS EX SOLR
CUTANEOUS | Status: AC
  Filled 2016-02-07: qty 20000

## 2016-02-07 MED ORDER — FENTANYL CITRATE (PF) 100 MCG/2ML IJ SOLN: 25.0000 ug | INTRAMUSCULAR | Status: DC | PRN

## 2016-02-07 MED ORDER — LABETALOL HCL 5 MG/ML IV SOLN: 10.0000 mg | INTRAVENOUS | Status: DC | PRN

## 2016-02-07 MED ORDER — ALUM & MAG HYDROXIDE-SIMETH 200-200-20 MG/5ML PO SUSP: 15.0000 mL | ORAL | Status: DC | PRN

## 2016-02-07 MED ORDER — THROMBIN 20000 UNITS EX SOLR
CUTANEOUS | Status: DC | PRN
  Administered 2016-02-07: 20 mL via TOPICAL

## 2016-02-07 MED ORDER — PROTAMINE SULFATE 10 MG/ML IV SOLN
INTRAVENOUS | Status: DC | PRN
Start: 2016-02-07 — End: 2016-02-07
  Administered 2016-02-07: 40 mg via INTRAVENOUS

## 2016-02-07 MED ORDER — HEPARIN SODIUM (PORCINE) 1000 UNIT/ML IJ SOLN
INTRAMUSCULAR | Status: DC | PRN
  Administered 2016-02-07: 8000 [IU] via INTRAVENOUS

## 2016-02-07 MED ORDER — ACETAMINOPHEN 325 MG PO TABS: 325.0000 mg | ORAL_TABLET | ORAL | Status: DC | PRN

## 2016-02-07 MED ORDER — OXYCODONE HCL 5 MG/5ML PO SOLN: 5.0000 mg | Freq: Once | ORAL | Status: DC | PRN

## 2016-02-07 MED ORDER — PHENOL 1.4 % MT LIQD
1.0000 | OROMUCOSAL | Status: DC | PRN
Start: 2016-02-07 — End: 2016-02-08

## 2016-02-07 MED ORDER — NEPRO/CARBSTEADY PO LIQD
237.0000 mL | Freq: Every day | ORAL | Status: DC
  Filled 2016-02-07 (×3): qty 237

## 2016-02-07 MED ORDER — PANTOPRAZOLE SODIUM 40 MG PO TBEC
40.0000 mg | DELAYED_RELEASE_TABLET | Freq: Every day | ORAL | Status: DC
  Administered 2016-02-08: 40 mg via ORAL
  Filled 2016-02-07: qty 1

## 2016-02-07 MED ORDER — ACETAMINOPHEN 160 MG/5ML PO SOLN
325.0000 mg | ORAL | Status: DC | PRN
  Filled 2016-02-07: qty 20.3

## 2016-02-07 MED ORDER — OXYCODONE HCL 5 MG PO TABS: 5.0000 mg | ORAL_TABLET | Freq: Once | ORAL | Status: DC | PRN

## 2016-02-07 MED ORDER — SENNOSIDES-DOCUSATE SODIUM 8.6-50 MG PO TABS: 1.0000 | ORAL_TABLET | Freq: Every evening | ORAL | Status: DC | PRN

## 2016-02-07 MED ORDER — SEVELAMER CARBONATE 800 MG PO TABS: 800.0000 mg | ORAL_TABLET | ORAL | Status: DC | PRN

## 2016-02-07 MED ORDER — SODIUM CHLORIDE 0.9 % IV SOLN
INTRAVENOUS | Status: DC
  Administered 2016-02-07: 25 mL/h via INTRAVENOUS

## 2016-02-07 MED ORDER — ALBUMIN HUMAN 5 % IV SOLN
INTRAVENOUS | Status: DC | PRN
  Administered 2016-02-07: 14:00:00 via INTRAVENOUS

## 2016-02-07 MED ORDER — CINACALCET HCL 30 MG PO TABS
90.0000 mg | ORAL_TABLET | Freq: Two times a day (BID) | ORAL | Status: DC
  Administered 2016-02-07 – 2016-02-08 (×2): 90 mg via ORAL
  Filled 2016-02-07 (×3): qty 3

## 2016-02-07 MED ORDER — MIDAZOLAM HCL 2 MG/2ML IJ SOLN
INTRAMUSCULAR | Status: AC
  Filled 2016-02-07: qty 2

## 2016-02-07 MED ORDER — OXYCODONE-ACETAMINOPHEN 5-325 MG PO TABS: 1.0000 | ORAL_TABLET | ORAL | Status: DC | PRN

## 2016-02-07 MED ORDER — KETOTIFEN FUMARATE 0.025 % OP SOLN
1.0000 [drp] | Freq: Every day | OPHTHALMIC | Status: DC | PRN
  Filled 2016-02-07: qty 5

## 2016-02-07 MED ORDER — PROPOFOL 10 MG/ML IV BOLUS
INTRAVENOUS | Status: DC | PRN
  Administered 2016-02-07: 50 mg via INTRAVENOUS

## 2016-02-07 MED ORDER — GUAIFENESIN-DM 100-10 MG/5ML PO SYRP: 15.0000 mL | ORAL_SOLUTION | ORAL | Status: DC | PRN

## 2016-02-07 MED ORDER — HEPARIN SODIUM (PORCINE) 1000 UNIT/ML IJ SOLN
INTRAMUSCULAR | Status: AC
  Filled 2016-02-07: qty 1

## 2016-02-07 MED ORDER — ONDANSETRON HCL 4 MG/2ML IJ SOLN: 4.0000 mg | Freq: Four times a day (QID) | INTRAMUSCULAR | Status: DC | PRN

## 2016-02-07 MED ORDER — SEVELAMER CARBONATE 800 MG PO TABS
4000.0000 mg | ORAL_TABLET | Freq: Three times a day (TID) | ORAL | Status: DC
  Administered 2016-02-07 – 2016-02-08 (×2): 4000 mg via ORAL
  Filled 2016-02-07 (×2): qty 5

## 2016-02-07 MED ORDER — SEVELAMER CARBONATE 800 MG PO TABS: 800.0000 mg | ORAL_TABLET | Freq: Every day | ORAL | Status: DC

## 2016-02-07 MED ORDER — LIDOCAINE-EPINEPHRINE (PF) 1 %-1:200000 IJ SOLN
INTRAMUSCULAR | Status: DC | PRN
  Administered 2016-02-07: 23 mL

## 2016-02-07 MED ORDER — METOPROLOL TARTRATE 5 MG/5ML IV SOLN
2.0000 mg | INTRAVENOUS | Status: DC | PRN
Start: 2016-02-07 — End: 2016-02-08

## 2016-02-07 MED ORDER — DOCUSATE SODIUM 100 MG PO CAPS
100.0000 mg | ORAL_CAPSULE | Freq: Two times a day (BID) | ORAL | Status: DC
  Filled 2016-02-07 (×2): qty 1

## 2016-02-07 MED ORDER — LIDOCAINE-EPINEPHRINE (PF) 1 %-1:200000 IJ SOLN
INTRAMUSCULAR | Status: AC
  Filled 2016-02-07: qty 30

## 2016-02-07 MED ORDER — OXYCODONE-ACETAMINOPHEN 5-325 MG PO TABS: 1.0000 | ORAL_TABLET | Freq: Four times a day (QID) | ORAL | Status: DC | PRN

## 2016-02-07 MED ORDER — 0.9 % SODIUM CHLORIDE (POUR BTL) OPTIME
TOPICAL | Status: DC | PRN
  Administered 2016-02-07: 1000 mL

## 2016-02-07 MED ORDER — SODIUM CHLORIDE 0.9 % IV SOLN
INTRAVENOUS | Status: DC | PRN
  Administered 2016-02-07: 500 mL

## 2016-02-07 MED ORDER — MORPHINE SULFATE (PF) 2 MG/ML IV SOLN: 2.0000 mg | INTRAVENOUS | Status: DC | PRN

## 2016-02-07 MED ORDER — FENTANYL CITRATE (PF) 250 MCG/5ML IJ SOLN
INTRAMUSCULAR | Status: DC | PRN
  Administered 2016-02-07 (×4): 25 ug via INTRAVENOUS

## 2016-02-07 MED ORDER — LIDOCAINE HCL (CARDIAC) 20 MG/ML IV SOLN: INTRAVENOUS | Status: DC | PRN

## 2016-02-07 MED ORDER — MIDAZOLAM HCL 5 MG/5ML IJ SOLN
INTRAMUSCULAR | Status: DC | PRN
  Administered 2016-02-07: 2 mg via INTRAVENOUS

## 2016-02-07 MED ORDER — ACETAMINOPHEN 500 MG PO TABS: 500.0000 mg | ORAL_TABLET | Freq: Two times a day (BID) | ORAL | Status: DC | PRN

## 2016-02-07 MED ORDER — ENOXAPARIN SODIUM 30 MG/0.3ML ~~LOC~~ SOLN: 30.0000 mg | SUBCUTANEOUS | Status: DC

## 2016-02-07 MED ORDER — RENA-VITE PO TABS
1.0000 | ORAL_TABLET | Freq: Every day | ORAL | Status: DC
  Administered 2016-02-07: 1 via ORAL
  Filled 2016-02-07: qty 1

## 2016-02-07 SURGICAL SUPPLY — 37 items
CANISTER SUCTION 2500CC (MISCELLANEOUS) ×3 IMPLANT
CANNULA VESSEL 3MM 2 BLNT TIP (CANNULA) ×2 IMPLANT
CATH EMB 4FR 80CM (CATHETERS) ×2 IMPLANT
CLIP TI MEDIUM 6 (CLIP) ×3 IMPLANT
CLIP TI WIDE RED SMALL 6 (CLIP) ×3 IMPLANT
DECANTER SPIKE VIAL GLASS SM (MISCELLANEOUS) ×3 IMPLANT
ELECT REM PT RETURN 9FT ADLT (ELECTROSURGICAL) ×3
ELECTRODE REM PT RTRN 9FT ADLT (ELECTROSURGICAL) ×1 IMPLANT
GLOVE BIO SURGEON STRL SZ 6.5 (GLOVE) ×1 IMPLANT
GLOVE BIO SURGEON STRL SZ7.5 (GLOVE) ×3 IMPLANT
GLOVE BIO SURGEONS STRL SZ 6.5 (GLOVE) ×1
GLOVE BIOGEL PI IND STRL 6.5 (GLOVE) IMPLANT
GLOVE BIOGEL PI IND STRL 8 (GLOVE) ×1 IMPLANT
GLOVE BIOGEL PI INDICATOR 6.5 (GLOVE) ×6
GLOVE BIOGEL PI INDICATOR 8 (GLOVE) ×4
GLOVE ECLIPSE 7.5 STRL STRAW (GLOVE) ×4 IMPLANT
GLOVE SURG SS PI 7.0 STRL IVOR (GLOVE) ×2 IMPLANT
GOWN STRL REUS W/ TWL LRG LVL3 (GOWN DISPOSABLE) ×3 IMPLANT
GOWN STRL REUS W/TWL LRG LVL3 (GOWN DISPOSABLE) ×9
GOWN STRL REUS W/TWL XL LVL3 (GOWN DISPOSABLE) ×2 IMPLANT
GRAFT GORETEX STRT 4-7X45 (Vascular Products) ×2 IMPLANT
KIT BASIN OR (CUSTOM PROCEDURE TRAY) ×3 IMPLANT
KIT ROOM TURNOVER OR (KITS) ×3 IMPLANT
LIQUID BAND (GAUZE/BANDAGES/DRESSINGS) ×3 IMPLANT
NDL HYPO 25GX1X1/2 BEV (NEEDLE) ×1 IMPLANT
NEEDLE HYPO 25GX1X1/2 BEV (NEEDLE) ×3 IMPLANT
NS IRRIG 1000ML POUR BTL (IV SOLUTION) ×3 IMPLANT
PACK CV ACCESS (CUSTOM PROCEDURE TRAY) ×3 IMPLANT
PAD ARMBOARD 7.5X6 YLW CONV (MISCELLANEOUS) ×6 IMPLANT
SPONGE SURGIFOAM ABS GEL 100 (HEMOSTASIS) IMPLANT
SUT PROLENE 6 0 BV (SUTURE) ×6 IMPLANT
SUT VIC AB 3-0 SH 27 (SUTURE) ×6
SUT VIC AB 3-0 SH 27X BRD (SUTURE) ×2 IMPLANT
SUT VICRYL 4-0 PS2 18IN ABS (SUTURE) ×6 IMPLANT
TAPE UMBILICAL COTTON 1/8X30 (MISCELLANEOUS) ×2 IMPLANT
UNDERPAD 30X30 INCONTINENT (UNDERPADS AND DIAPERS) ×3 IMPLANT
WATER STERILE IRR 1000ML POUR (IV SOLUTION) ×3 IMPLANT

## 2016-02-07 NOTE — Interval H&P Note (Signed)
History and Physical Interval Note:  02/07/2016 1:37 PM  Sue Neal  has presented today for surgery, with the diagnosis of End Stage Renal Disease N18.6; Aneurysmal left thigh arteriovenous graft I72.9  The various methods of treatment have been discussed with the patient and family. After consideration of risks, benefits and other options for treatment, the patient has consented to  Procedure(s): REVISION OF ARTERIOVENOUS GORETEX GRAFT (Left) as a surgical intervention .  The patient's history has been reviewed, patient examined, no change in status, stable for surgery.  I have reviewed the patient's chart and labs.  Questions were answered to the patient's satisfaction.     [name]

## 2016-02-07 NOTE — Progress Notes (Signed)
Pt arrived to unit from PACU.  Telemetry applied and CCMD notified.  Pt oriented to room including call light and telephone.  Pt denies pain.  Will cont to monitor.

## 2016-02-07 NOTE — Discharge Instructions (Signed)
° ° °  02/07/2016 Sue Neal [id number] Dec 23, 1945  Surgeon(s): Angelia Mould, MD  Procedure(s): REVISION OF LEFT THIGH ARTERIOVENOUS GORETEX GRAFT  x May stick graft on designated area only: stick only on medial side of graft. Lateral side is the new graft. Do not stick lateral side for 4 weeks.

## 2016-02-07 NOTE — Op Note (Signed)
    NAME: Sue Neal   MRN: [id number] DOB: [birth year]    DATE OF OPERATION: 02/07/2016  PREOP DIAGNOSIS: aneurysm of left thigh AV graft  POSTOP DIAGNOSIS: same  PROCEDURE: Revision of left thigh AV graft  SURGEON: Judeth Cornfield. Scot Dock, MD, FACS  ASSIST: Silva Bandy, PAC and [facility] PA  ANESTHESIA: Gen.   EBL: minimal  INDICATIONS: [name] is a 70 y.o. female has a left thigh AV graft which she has had for many years. She has a large aneurysm on the lateral aspect of the graft which was enlarging. It was felt that revision was necessary. The graft has been revised multiple times and is markedly degenerative. There is also some outflow stenosis by duplex.  FINDINGS: The graft was markedly calcified and could not be clamped. It had to be controlled with a balloon. There are no further options for revision of this graft. I bypassed medially around the aneurysm and preserving the remainder of the graft.  TECHNIQUE: The patient was taken to the operating room and received a general anesthetic. The left eye was prepped and draped in usual sterile fashion. An incision above and below the aneurysm was made in the graft here was dissected free at both ends. The graft was markedly calcified throughout and there was really no soft spots. A tunnel was crated between the 2 incisions and a 7 mm graft tunneled between the 2 incisions. The patient was heparinized. The graft was divided proximally after I was able to clamp just above this. Distally there was no way to clamp and I divided the graft and passed a Fogarty catheter for control. I was able to endarterectomize enough of the calcium within the graft distally to sew an anastomosis. The 7 mm graft was sewn into and to the distal end of the graft using continuous 6-0 Prolene suture. I did pass deformity catheter multiple times and no clot was retrieved and I was able to pass through the venous anastomosis without too much problem. At the  arterial end again I tried to endarterectomize the graft. The graft was cut the appropriate length and sewn into into the proximal arterial limb of the graft with continuous 6-0 Prolene suture. At the completion there was a thrill in the graft which was somewhat pulsatile as it was preop. The heparin was partially reversed with protamine. Hemostasis was obtained in the wounds. The wounds were closed the deep periosteal Vicryl skin closed with 4-0 Vicryl. Liquid bandage was applied. The patient tolerated the procedure well and was transferred to the recovery room in stable condition. All needle and sponge counts were correct.  [name], MD, FACS [facility]  DATE OF DICTATION:   02/07/2016

## 2016-02-07 NOTE — Anesthesia Procedure Notes (Signed)
Procedure Name: LMA Insertion Date/Time: 02/07/2016 2:07 PM Performed by: Scheryl Darter Pre-anesthesia Checklist: Patient identified, Emergency Drugs available, Suction available and Patient being monitored Patient Re-evaluated:Patient Re-evaluated prior to inductionOxygen Delivery Method: Circle System Utilized Preoxygenation: Pre-oxygenation with 100% oxygen Intubation Type: IV induction Ventilation: Mask ventilation without difficulty LMA: LMA inserted LMA Size: 4.0 Number of attempts: 1 Airway Equipment and Method: Bite block Placement Confirmation: positive ETCO2 and breath sounds checked- equal and bilateral Tube secured with: Tape Dental Injury: Teeth and Oropharynx as per pre-operative assessment

## 2016-02-07 NOTE — H&P (View-Only) (Signed)
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 female, who is referred by Dr. 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 She has had a left Frey AV graft for many years. I cannot find the original operative report. Nadara Mustard this is been revised multiple times including replacement of the venous half of the graft. 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 She's had an aneurysmal segment on the lateral aspect of the graft which she feels has gotten slightly larger. For this reason she was sent for evaluation for possible revision.  She dialyzes on Tuesdays Thursdays and Saturdays.  Past Medical History  Diagnosis Date  . Anemia   . Hyperparathyroidism, secondary ([place])   . Back pain, chronic   . Uterine fibroid   . Chronic kidney disease   . 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 Hypertension     off meds now    Family History  Problem Relation Age of Onset  . Cancer Mother     COLON    SOCIAL HISTORY: Social History   Social History  . Marital Status: Single    Spouse Name: N/A  . Number of Children: N/A  . Years of Education: N/A   Occupational History  . Not on file.   Social History Main Topics  . Smoking status: Former Smoker -- 0.10 packs/day    Types: Cigarettes    Quit date: 02/03/1978  . Smokeless tobacco: Never Used  . Alcohol Use: No  . Drug Use: No  . Sexual Activity: Not on file   Other Topics Concern  . Not on file   Social History Narrative    No Known Allergies  Current Outpatient Prescriptions  Medication Sig Dispense Refill  . cinacalcet (SENSIPAR) 90 MG tablet Take 90 mg by mouth  daily.    . multivitamin (RENA-VIT) TABS tablet Take 1 tablet by mouth daily.    Marland Kitchen PROTEIN PO Take by mouth as needed. 2 teaspoons occasionally    . sevelamer (RENVELA) 800 MG tablet Take 800 mg by mouth 3 (three) times daily with meals.    Marland Kitchen BIOTIN 5000 PO Take 1 tablet by mouth daily. Reported on 01/15/2016    . HYDROcodone-acetaminophen (NORCO) 5-325 MG per tablet Take 1 tablet by mouth every 6 (six) hours as needed for moderate pain. (Patient not taking: Reported on 01/15/2016) 30 tablet 0  . hydrocortisone 1 % ointment Apply topically 2 (two) times daily. (Patient not taking: Reported on 01/15/2016) 30 g 0  . traMADol (ULTRAM) 50 MG tablet Take 50 mg by mouth every 6 (six) hours as needed. Reported on 01/15/2016     No current facility-administered medications for this visit.    REVIEW OF SYSTEMS:  [X]  denotes positive finding, [ ]  denotes negative finding Cardiac  Comments:  Chest pain or chest pressure:    Shortness of breath upon exertion:    Short of breath when lying flat:    Irregular heart rhythm:        Vascular    Pain in calf, thigh, or hip brought on by ambulation: X   Pain in feet at  night that wakes you up from your sleep:     Blood clot in your veins:    Leg swelling:         Pulmonary    Oxygen at home:    Productive cough:     Wheezing:         Neurologic    Sudden weakness in arms or legs:  X   Sudden numbness in arms or legs:     Sudden onset of difficulty speaking or slurred speech:    Temporary loss of vision in one eye:     Problems with dizziness:         Gastrointestinal    Blood in stool:     Vomited blood:         Genitourinary    Burning when urinating:     Blood in urine:        Psychiatric    Major depression:         Hematologic    Bleeding problems:    Problems with blood clotting too easily:        Skin    Rashes or ulcers:        Constitutional    Fever or chills:      PHYSICAL EXAM: Filed Vitals:   01/15/16 1231  BP:  108/57  Pulse: 83  Temp: 98.2 F (36.8 C)  TempSrc: Oral  Resp: 18  Height: 5\' 3"  (1.6 m)  Weight: 193 lb 12.8 oz (87.907 kg)  SpO2: 100%    GENERAL: The patient is a well-nourished female, in no acute distress. The vital signs are documented above. CARDIAC: There is a regular rate and rhythm.  VASCULAR: her graft in the left thigh is pulsatile. The lateral aspect of the graft has an aneurysmal segment. She has had multiple revisions of her left thigh AV graft. PULMONARY: There is good air exchange bilaterally without wheezing or rales. ABDOMEN: Soft and non-tender with normal pitched bowel sounds.  MUSCULOSKELETAL: There are no major deformities or cyanosis. NEUROLOGIC: No focal weakness or paresthesias are detected. SKIN: There are no ulcers or rashes noted. PSYCHIATRIC: The patient has a normal affect.  DATA:   DUPLEX LEFT THIGH AV GRAFT: I have independently interpreted the duplex of the left thigh AV graft. There is an aneurysm along the arterial limb of the graft, laterally, which measures 2.5 cm in maximum diameter. There is some thrombus within the aneurysm. There are also some elevated velocities at the venous anastomosis.  MEDICAL ISSUES:  END-STAGE RENAL DISEASE: This patient has an aneurysmal segment on the lateral aspect of her graft. This has gradually enlarged and for this reason I would recommend elective revision of this. As she dialyzes on Tuesdays Thursdays and Saturdays I can only do this on Fridays. I have added on multiple Friday offices and therefore I'm unable to perform the procedure until May 19. I encouraged her to let one of my partners do this sooner however she would prefer to wait. I explained that there is a risk of graft thrombosis in the interim. In addition I explained that there is also evidence of an outflow stenosis as seen on duplex and also given the pulsatility of her graft. I have recommended that we replace the arterial half of the graft and  consider revising the venous end at the same time if I'm concerned about eminent graft thrombosis. I've also explained that this graft is quite degenerative and is been in for some time and ultimately  we may need to consider new access in the future.  [name] [facility] [place]: [phone]

## 2016-02-07 NOTE — Transfer of Care (Signed)
Immediate Anesthesia Transfer of Care Note  Patient: [name]  Procedure(s) Performed: Procedure(s): REVISION OF LEFT THIGH ARTERIOVENOUS GORETEX GRAFT (Left)  Patient Location: PACU  Anesthesia Type:General  Level of Consciousness: awake and alert   Airway & Oxygen Therapy: Patient Spontanous Breathing and Patient connected to nasal cannula oxygen  Post-op Assessment: Report given to RN, Post -op Vital signs reviewed and stable and Patient moving all extremities X 4  Post vital signs: Reviewed and stable  Last Vitals:  Filed Vitals:   02/07/16 1218  BP: 127/52  Pulse: 89  Temp: 37.1 C  Resp: 18    Last Pain: There were no vitals filed for this visit.       Complications: No apparent anesthesia complications

## 2016-02-07 NOTE — Anesthesia Preprocedure Evaluation (Addendum)
Anesthesia Evaluation  Patient identified by MRN, date of birth, ID band Patient awake    Reviewed: Allergy & Precautions, NPO status , Patient's Chart, lab work & pertinent test results  Airway Mallampati: II  TM Distance: >3 FB Neck ROM: Full    Dental  (+) Missing, Dental Advisory Given, Poor Dentition   Pulmonary former smoker,    breath sounds clear to auscultation       Cardiovascular hypertension,  Rhythm:Regular Rate:Normal     Neuro/Psych negative neurological ROS     GI/Hepatic Neg liver ROS,   Endo/Other  Morbid obesity  Renal/GU Dialysis and ESRFRenal disease     Musculoskeletal  (+) Arthritis ,   Abdominal   Peds  Hematology  (+) anemia ,   Anesthesia Other Findings   Reproductive/Obstetrics                            Anesthesia Physical Anesthesia Plan  ASA: III  Anesthesia Plan: General   Post-op Pain Management:    Induction: Intravenous  Airway Management Planned: LMA  Additional Equipment: None  Intra-op Plan:   Post-operative Plan: Extubation in OR  Informed Consent: I have reviewed the patients History and Physical, chart, labs and discussed the procedure including the risks, benefits and alternatives for the proposed anesthesia with the patient or authorized representative who has indicated his/her understanding and acceptance.   Dental advisory given  Plan Discussed with: CRNA and Surgeon  Anesthesia Plan Comments:         Anesthesia Quick Evaluation

## 2016-02-08 DIAGNOSIS — Z992 Dependence on renal dialysis: Secondary | ICD-10-CM | POA: Diagnosis not present

## 2016-02-08 DIAGNOSIS — I120 Hypertensive chronic kidney disease with stage 5 chronic kidney disease or end stage renal disease: Secondary | ICD-10-CM | POA: Diagnosis not present

## 2016-02-08 DIAGNOSIS — Z87891 Personal history of nicotine dependence: Secondary | ICD-10-CM | POA: Diagnosis not present

## 2016-02-08 DIAGNOSIS — T82898A Other specified complication of vascular prosthetic devices, implants and grafts, initial encounter: Secondary | ICD-10-CM | POA: Diagnosis not present

## 2016-02-08 DIAGNOSIS — N186 End stage renal disease: Secondary | ICD-10-CM | POA: Diagnosis not present

## 2016-02-08 NOTE — Progress Notes (Signed)
ORder to discharge received.  IV and tele removed.  CCMD notified. Pt friend en route to pick her up.  Discharge paperwork reviewed.

## 2016-02-08 NOTE — Progress Notes (Addendum)
[facility] [place]  Subjective  - Doing well, pain controlled.  Sitting up in chair having breakfast.   Objective 107/44 81 98.2 F (36.8 C) (Oral) 19 96%  Intake/Output Summary (Last 24 hours) at 02/08/16 0747 Last data filed at 02/07/16 1645  Gross per 24 hour  Intake    795 ml  Output     15 ml  Net    780 ml    Left thigh soft, no frank hematoma Left foot warm and well perfused, sensation grossly intact.  Assessment/Planning: POD # 1 Revision of left thigh AV graft    Discharge home in stable condition F/U PRN, do not stick lateral graft for 4 weeks.  She is going to her HD center today at noon.  Laurence Slate [facility] 02/08/2016 7:47 AM --  Laboratory Lab Results:  Recent Labs  02/07/16 1300 02/07/16 1756  WBC  --  6.2  HGB 16.3* 12.9  HCT 48.0* 43.3  PLT  --  65*   BMET  Recent Labs  02/07/16 1300 02/07/16 1756  NA 143 144  K 4.2 4.1  CL  --  98*  CO2  --  34*  GLUCOSE 72 126*  BUN  --  15  CREATININE  --  7.26*  CALCIUM  --  9.7    COAG No results found for: INR, PROTIME No results found for: PTT  I have interviewed the patient and examined the patient. I agree with the findings by the PA. Her left thigh AVG has a good bruit and thrill Home this AM  Gae Gallop, MD [phone]

## 2016-02-08 NOTE — Anesthesia Postprocedure Evaluation (Signed)
Anesthesia Post Note  Patient: Sue Neal  Procedure(s) Performed: Procedure(s) (LRB): REVISION OF LEFT THIGH ARTERIOVENOUS GORETEX GRAFT (Left)  Patient location during evaluation: PACU Anesthesia Type: General Level of consciousness: awake Pain management: pain level controlled Vital Signs Assessment: post-procedure vital signs reviewed and stable Respiratory status: spontaneous breathing Cardiovascular status: stable Postop Assessment: no signs of nausea or vomiting Anesthetic complications: no    Last Vitals:  Filed Vitals:   02/08/16 0015 02/08/16 0502  BP: 99/48 107/44  Pulse: 81 81  Temp: 37.1 C 36.8 C  Resp: 16 19    Last Pain:  Filed Vitals:   02/08/16 0503  PainSc: Asleep                 Pualani Borah

## 2016-02-10 DIAGNOSIS — N186 End stage renal disease: Secondary | ICD-10-CM | POA: Diagnosis not present

## 2016-02-10 DIAGNOSIS — E875 Hyperkalemia: Secondary | ICD-10-CM | POA: Diagnosis not present

## 2016-02-10 NOTE — Discharge Summary (Signed)
[facility] Discharge Summary  Sue Neal 1946-05-31 70 y.o. 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 female was referred by Dr. 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 She has had a left Frey AV graft for many years. I cannot find the original operative report. Nadara Mustard this is been revised multiple times including replacement of the venous half of the graft. 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 She's had an aneurysmal segment on the lateral aspect of the graft which she feels has gotten slightly larger. For this reason she was sent for evaluation for possible revision.  She dialyzes on Tuesdays Thursdays and Saturdays.  Hospital Course:  The patient was admitted to the hospital and taken to the operating room on 02/07/2016 and underwent: revision of left thigh AV graft    The patient tolerated the procedure well and was transported to the PACU in good condition.   POD 1: The patient's left thigh graft had a good bruit and thrill. Her left thigh was soft without frank hematoma. She was discharged home on POD 1 in good condition. She was to go to her outpatient dialysis center that day. She was instructed to not have the lateral limb of her graft cannulated for 4 weeks.    CBC    Component Value Date/Time   WBC 6.2 02/07/2016 1756   RBC 4.31 02/07/2016 1756   HGB 12.9 02/07/2016 1756   HCT 43.3 02/07/2016 1756   PLT 65* 02/07/2016 1756   MCV 100.5* 02/07/2016 1756   MCH 29.9 02/07/2016 1756   MCHC 29.8* 02/07/2016 1756   RDW 14.8 02/07/2016 1756    BMET    Component Value Date/Time   NA 144 02/07/2016 1756   K 4.1 02/07/2016 1756    CL 98* 02/07/2016 1756   CO2 34* 02/07/2016 1756   GLUCOSE 126* 02/07/2016 1756   BUN 15 02/07/2016 1756   CREATININE 7.26* 02/07/2016 1756   CALCIUM 9.7 02/07/2016 1756   GFRNONAA 5* 02/07/2016 1756   GFRAA 6* 02/07/2016 1756     Discharge Instructions:   The patient is discharged to home with extensive instructions on wound care and progressive ambulation.  They are instructed not to drive or perform any heavy lifting until returning to see the physician in his office.  Discharge Instructions    Call MD for:  redness, tenderness, or signs of infection (pain, swelling, bleeding, redness, odor or green/yellow discharge around incision site)    Complete by:  As directed      Call MD for:  severe or increased pain, loss or decreased feeling  in affected limb(s)    Complete by:  As directed      Call MD for:  temperature >100.5    Complete by:  As directed      Discharge patient    Complete by:  As directed   Discharge pt to home tomorrow am she will attend her out patient HD clinic.     Discharge patient    Complete by:  As directed   Discharge pt to home     Discharge wound care:    Complete by:  As directed   Wash wounds  daily with soap and water and pat dry.     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 ESRD (end stage renal disease) ([place]) 02/07/2016  . Pain in limb 10/31/2013  . Numbness of fingers 07/19/2012  . Aftercare following surgery of the circulatory system, [place] 06/21/2012  . Other complications  due to other vascular device, implant, and graft 06/21/2012  . End stage renal disease ([place]) 02/04/2012   Past Medical History  Diagnosis Date  . Anemia   . Hyperparathyroidism, secondary ([place])   . Back pain, chronic   . Uterine fibroid   . 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 Hypertension     off meds now  . Complication of anesthesia     " I woke up during the procedure."  . Arthritis   . ESRD (end stage renal disease) on dialysis ([place])     "TTS; [facility]" (02/07/2016)       Medication List    TAKE these medications        acetaminophen 500 MG tablet  Commonly known as:  TYLENOL  Take 500 mg by mouth 2 (two) times daily as needed for moderate pain (for back).     cinacalcet 90 MG tablet  Commonly known as:  SENSIPAR  Take 90 mg by mouth 2 (two) times daily.     feeding supplement (NEPRO CARB STEADY) Liqd  Take 237 mLs by mouth daily.     ketotifen 0.025 % ophthalmic solution  Commonly known as:  ZADITOR  Place 1 drop into both eyes daily as needed (for dry eyes).     multivitamin Tabs tablet  Take 1 tablet by mouth daily.     oxyCODONE-acetaminophen 5-325 MG tablet  Commonly known as:  ROXICET  Take 1 tablet by mouth every 6 (six) hours as needed.     sevelamer carbonate 800 MG tablet  Commonly known as:  RENVELA  Take 800-4,000 mg by mouth 5 (five) times daily. [phone] mg with snacks and 4000 mg with meals        Percocet #15 No Refill  Disposition: home  Patient's condition: is Good  Follow up: 1. Dr. Scot Dock prn   Virgina Jock, PA-C [facility] [phone] 02/10/2016  1:07 PM

## 2016-02-11 ENCOUNTER — Encounter (HOSPITAL_COMMUNITY): Payer: Self-pay | Admitting: Vascular Surgery

## 2016-02-12 ENCOUNTER — Ambulatory Visit (AMBULATORY_SURGERY_CENTER): Payer: Self-pay | Admitting: *Deleted

## 2016-02-12 ENCOUNTER — Telehealth: Payer: Self-pay | Admitting: *Deleted

## 2016-02-12 VITALS — Ht 62.0 in | Wt 199.2 lb

## 2016-02-12 DIAGNOSIS — Z800 Family history of malignant neoplasm of digestive organs: Secondary | ICD-10-CM

## 2016-02-12 MED ORDER — NA SULFATE-K SULFATE-MG SULF 17.5-3.13-1.6 GM/177ML PO SOLN: ORAL | Status: DC

## 2016-02-12 NOTE — Telephone Encounter (Signed)
Pt notified that she will need Ov before scheduling colonoscopy.  Colonoscopy scheduled for 02/26/16 cancelled.  No available NGI appt. Available until 8/16.  Pt will call in June to schedule OV for August.

## 2016-02-12 NOTE — Telephone Encounter (Signed)
No, she should be seen in office first.  [facility], next available, do not double book plese

## 2016-02-12 NOTE — Telephone Encounter (Signed)
Dr Ardis Hughs:  Pt is scheduled for direct screening colonoscopy with you 02/26/16.  Pt has ESRD with hemodialysis 3 x a week.  She had revision of AV graft 5/19 with shunt located in left thigh.  Pt has family hx of colon cancer in her mother.  Pt's last colonoscopy 15 years ago; she does not know where she had it or who MD was.  Is pt okay for direct or does she need OV before scheduling colonoscopy?  Thanks, Juliann Pulse

## 2016-02-12 NOTE — Progress Notes (Signed)
No allergies to eggs or soy. No problems with anesthesia.  Pt not given Emmi instructions for colonoscopy; no computer access  No oxygen use  No diet drug use  

## 2016-02-13 DIAGNOSIS — N186 End stage renal disease: Secondary | ICD-10-CM | POA: Diagnosis not present

## 2016-02-13 DIAGNOSIS — E875 Hyperkalemia: Secondary | ICD-10-CM | POA: Diagnosis not present

## 2016-02-14 ENCOUNTER — Ambulatory Visit (INDEPENDENT_AMBULATORY_CARE_PROVIDER_SITE_OTHER): Payer: Medicare Other | Admitting: Podiatry

## 2016-02-14 ENCOUNTER — Encounter: Payer: Self-pay | Admitting: Podiatry

## 2016-02-14 VITALS — BP 116/64 | HR 94 | Resp 18

## 2016-02-14 DIAGNOSIS — B351 Tinea unguium: Secondary | ICD-10-CM

## 2016-02-14 MED ORDER — NONFORMULARY OR COMPOUNDED ITEM: Status: DC

## 2016-02-15 DIAGNOSIS — N186 End stage renal disease: Secondary | ICD-10-CM | POA: Diagnosis not present

## 2016-02-15 DIAGNOSIS — E875 Hyperkalemia: Secondary | ICD-10-CM | POA: Diagnosis not present

## 2016-02-18 DIAGNOSIS — N186 End stage renal disease: Secondary | ICD-10-CM | POA: Diagnosis not present

## 2016-02-18 DIAGNOSIS — E875 Hyperkalemia: Secondary | ICD-10-CM | POA: Diagnosis not present

## 2016-02-19 DIAGNOSIS — N186 End stage renal disease: Secondary | ICD-10-CM | POA: Diagnosis not present

## 2016-02-19 DIAGNOSIS — Q612 Polycystic kidney, adult type: Secondary | ICD-10-CM | POA: Diagnosis not present

## 2016-02-19 DIAGNOSIS — Z992 Dependence on renal dialysis: Secondary | ICD-10-CM | POA: Diagnosis not present

## 2016-02-20 NOTE — Progress Notes (Signed)
Patient ID: Sue Neal, female   DOB: [birth year], 70 y.o.   MRN: [id number]  Subjective: 70 year old female presents the office today for concerns of discolored, thick toenails that she cannot trim herself as well as use also discussed possible treatment options. Just states that she gets some itchiness around the toenails. She denies any redness between the toes or to the toenails. She does put a towel between her toes and this helps. Denies any systemic complaints such as fevers, chills, nausea, vomiting. No acute changes since last appointment, and no other complaints at this time.   Objective: AAO x3, NAD DP/PT pulses palpable bilaterally, CRT less than 3 seconds Nails appear to be hypertrophic, dystrophic, discolored and taken to the hallux toenails. Ascending redness or drainage. Tenderness to bilateral hallux toenails. There is no significant tinea pedis present at this time. No areas of pinpoint bony tenderness or pain with vibratory sensation. MMT 5/5, ROM WNL. No edema, erythema, increase in warmth to bilateral lower extremities.  No open lesions or pre-ulcerative lesions.  No pain with calf compression, swelling, warmth, erythema  Assessment: Symptomatic onychomycosis  Plan: -All treatment options discussed with the patient including all alternatives, risks, complications.  Nails were debrided without, occasions or bleeding. -Discussed possible treatment options for the toenails and this point she would proceed with treatment. I ordered a compound cream for onychomycosis. Directed on use and I discussed the longevity -Follow-up in 3 months or sooner if any problems arise. In the meantime, encouraged to call the office with any questions, concerns, change in symptoms.   Celesta Gentile, DPM

## 2016-02-22 DIAGNOSIS — N186 End stage renal disease: Secondary | ICD-10-CM | POA: Diagnosis not present

## 2016-02-25 DIAGNOSIS — N186 End stage renal disease: Secondary | ICD-10-CM | POA: Diagnosis not present

## 2016-02-26 ENCOUNTER — Encounter: Payer: Medicare Other | Admitting: Gastroenterology

## 2016-02-26 DIAGNOSIS — H2513 Age-related nuclear cataract, bilateral: Secondary | ICD-10-CM | POA: Diagnosis not present

## 2016-02-28 DIAGNOSIS — M65312 Trigger thumb, left thumb: Secondary | ICD-10-CM | POA: Diagnosis not present

## 2016-02-29 DIAGNOSIS — N186 End stage renal disease: Secondary | ICD-10-CM | POA: Diagnosis not present

## 2016-03-03 ENCOUNTER — Encounter: Payer: Self-pay | Admitting: Gastroenterology

## 2016-03-03 DIAGNOSIS — N186 End stage renal disease: Secondary | ICD-10-CM | POA: Diagnosis not present

## 2016-03-07 DIAGNOSIS — N186 End stage renal disease: Secondary | ICD-10-CM | POA: Diagnosis not present

## 2016-03-10 DIAGNOSIS — N186 End stage renal disease: Secondary | ICD-10-CM | POA: Diagnosis not present

## 2016-03-14 DIAGNOSIS — N186 End stage renal disease: Secondary | ICD-10-CM | POA: Diagnosis not present

## 2016-03-17 DIAGNOSIS — N186 End stage renal disease: Secondary | ICD-10-CM | POA: Diagnosis not present

## 2016-03-20 DIAGNOSIS — Q612 Polycystic kidney, adult type: Secondary | ICD-10-CM | POA: Diagnosis not present

## 2016-03-20 DIAGNOSIS — Z992 Dependence on renal dialysis: Secondary | ICD-10-CM | POA: Diagnosis not present

## 2016-03-20 DIAGNOSIS — N186 End stage renal disease: Secondary | ICD-10-CM | POA: Diagnosis not present

## 2016-03-21 DIAGNOSIS — D509 Iron deficiency anemia, unspecified: Secondary | ICD-10-CM | POA: Diagnosis not present

## 2016-03-21 DIAGNOSIS — N186 End stage renal disease: Secondary | ICD-10-CM | POA: Diagnosis not present

## 2016-03-24 DIAGNOSIS — D509 Iron deficiency anemia, unspecified: Secondary | ICD-10-CM | POA: Diagnosis not present

## 2016-03-24 DIAGNOSIS — N186 End stage renal disease: Secondary | ICD-10-CM | POA: Diagnosis not present

## 2016-03-28 DIAGNOSIS — D509 Iron deficiency anemia, unspecified: Secondary | ICD-10-CM | POA: Diagnosis not present

## 2016-03-28 DIAGNOSIS — N186 End stage renal disease: Secondary | ICD-10-CM | POA: Diagnosis not present

## 2016-03-31 DIAGNOSIS — N186 End stage renal disease: Secondary | ICD-10-CM | POA: Diagnosis not present

## 2016-03-31 DIAGNOSIS — D509 Iron deficiency anemia, unspecified: Secondary | ICD-10-CM | POA: Diagnosis not present

## 2016-04-02 DIAGNOSIS — N186 End stage renal disease: Secondary | ICD-10-CM | POA: Diagnosis not present

## 2016-04-02 DIAGNOSIS — D509 Iron deficiency anemia, unspecified: Secondary | ICD-10-CM | POA: Diagnosis not present

## 2016-04-04 DIAGNOSIS — D509 Iron deficiency anemia, unspecified: Secondary | ICD-10-CM | POA: Diagnosis not present

## 2016-04-04 DIAGNOSIS — N186 End stage renal disease: Secondary | ICD-10-CM | POA: Diagnosis not present

## 2016-04-07 DIAGNOSIS — N186 End stage renal disease: Secondary | ICD-10-CM | POA: Diagnosis not present

## 2016-04-07 DIAGNOSIS — D509 Iron deficiency anemia, unspecified: Secondary | ICD-10-CM | POA: Diagnosis not present

## 2016-04-11 DIAGNOSIS — D509 Iron deficiency anemia, unspecified: Secondary | ICD-10-CM | POA: Diagnosis not present

## 2016-04-11 DIAGNOSIS — N186 End stage renal disease: Secondary | ICD-10-CM | POA: Diagnosis not present

## 2016-04-13 DIAGNOSIS — M65312 Trigger thumb, left thumb: Secondary | ICD-10-CM | POA: Diagnosis not present

## 2016-04-14 DIAGNOSIS — N186 End stage renal disease: Secondary | ICD-10-CM | POA: Diagnosis not present

## 2016-04-14 DIAGNOSIS — D509 Iron deficiency anemia, unspecified: Secondary | ICD-10-CM | POA: Diagnosis not present

## 2016-04-16 DIAGNOSIS — N186 End stage renal disease: Secondary | ICD-10-CM | POA: Diagnosis not present

## 2016-04-16 DIAGNOSIS — D509 Iron deficiency anemia, unspecified: Secondary | ICD-10-CM | POA: Diagnosis not present

## 2016-04-18 DIAGNOSIS — N186 End stage renal disease: Secondary | ICD-10-CM | POA: Diagnosis not present

## 2016-04-18 DIAGNOSIS — D509 Iron deficiency anemia, unspecified: Secondary | ICD-10-CM | POA: Diagnosis not present

## 2016-04-20 DIAGNOSIS — Z992 Dependence on renal dialysis: Secondary | ICD-10-CM | POA: Diagnosis not present

## 2016-04-20 DIAGNOSIS — N186 End stage renal disease: Secondary | ICD-10-CM | POA: Diagnosis not present

## 2016-04-20 DIAGNOSIS — Q612 Polycystic kidney, adult type: Secondary | ICD-10-CM | POA: Diagnosis not present

## 2016-04-21 DIAGNOSIS — D509 Iron deficiency anemia, unspecified: Secondary | ICD-10-CM | POA: Diagnosis not present

## 2016-04-21 DIAGNOSIS — N186 End stage renal disease: Secondary | ICD-10-CM | POA: Diagnosis not present

## 2016-04-21 DIAGNOSIS — N2581 Secondary hyperparathyroidism of renal origin: Secondary | ICD-10-CM | POA: Diagnosis not present

## 2016-04-23 DIAGNOSIS — N186 End stage renal disease: Secondary | ICD-10-CM | POA: Diagnosis not present

## 2016-04-23 DIAGNOSIS — N2581 Secondary hyperparathyroidism of renal origin: Secondary | ICD-10-CM | POA: Diagnosis not present

## 2016-04-23 DIAGNOSIS — D509 Iron deficiency anemia, unspecified: Secondary | ICD-10-CM | POA: Diagnosis not present

## 2016-04-25 DIAGNOSIS — D509 Iron deficiency anemia, unspecified: Secondary | ICD-10-CM | POA: Diagnosis not present

## 2016-04-25 DIAGNOSIS — N2581 Secondary hyperparathyroidism of renal origin: Secondary | ICD-10-CM | POA: Diagnosis not present

## 2016-04-25 DIAGNOSIS — N186 End stage renal disease: Secondary | ICD-10-CM | POA: Diagnosis not present

## 2016-04-28 DIAGNOSIS — N186 End stage renal disease: Secondary | ICD-10-CM | POA: Diagnosis not present

## 2016-04-28 DIAGNOSIS — N2581 Secondary hyperparathyroidism of renal origin: Secondary | ICD-10-CM | POA: Diagnosis not present

## 2016-04-28 DIAGNOSIS — D509 Iron deficiency anemia, unspecified: Secondary | ICD-10-CM | POA: Diagnosis not present

## 2016-04-29 ENCOUNTER — Ambulatory Visit (INDEPENDENT_AMBULATORY_CARE_PROVIDER_SITE_OTHER): Payer: Medicare Other | Admitting: Gastroenterology

## 2016-04-29 ENCOUNTER — Encounter (INDEPENDENT_AMBULATORY_CARE_PROVIDER_SITE_OTHER): Payer: Self-pay

## 2016-04-29 ENCOUNTER — Encounter: Payer: Self-pay | Admitting: Gastroenterology

## 2016-04-29 VITALS — BP 130/64 | HR 64 | Ht 62.0 in | Wt 195.0 lb

## 2016-04-29 DIAGNOSIS — Z86010 Personal history of colonic polyps: Secondary | ICD-10-CM

## 2016-04-29 DIAGNOSIS — Z800 Family history of malignant neoplasm of digestive organs: Secondary | ICD-10-CM

## 2016-04-29 DIAGNOSIS — Z1211 Encounter for screening for malignant neoplasm of colon: Secondary | ICD-10-CM

## 2016-04-29 MED ORDER — NA SULFATE-K SULFATE-MG SULF 17.5-3.13-1.6 GM/177ML PO SOLN
1.0000 | Freq: Once | ORAL | 0 refills | Status: AC
Start: 2016-04-29 — End: 2016-04-29

## 2016-04-29 NOTE — Progress Notes (Signed)
 HPI: This is a very pleasant 70-year-old woman  who was referred to me by Dunham, Cynthia, MD  to evaluate  colon cancer screening .    Chief complaint is family history colon cancer, personal history of adenomatous colon polyp   I was able to find a pathology result from 2001 in [facility] system. This showed a small adenomatous polyp from the colon. I cannot tell from this pathology report who the gastroenterologist was.  She is not really sure who did it, but it was done at [facility]   Cbc 12/2015 13.7  Her mother had colon cancer, diagnosed when she was 78.  Thinks it should have been discovered sooner than it was.  She passed 2-3 months after it was diagnosed.  No other family history of colon cancer.  She is on HD, Tuesday, thurs and Saturday.  She has no issues with her own bowels. No constipation, no diarrhea, no overt GI bleeding.  Review of systems: Pertinent positive and negative review of systems were noted in the above HPI section. Complete review of systems was performed and was otherwise normal.   Past Medical History:  Diagnosis Date  . Anemia   . Arthritis   . Back pain, chronic   . Blood transfusion without reported diagnosis   . Cataract   . Complication of anesthesia    " I woke up during the procedure."  . ESRD (end stage renal disease) on dialysis ([facility])    "TTS; [facility]" (02/07/2016)  . Hyperparathyroidism, secondary ([facility])   . Hypertension    off meds now  . 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 Uterine fibroid     Past Surgical History:  Procedure Laterality Date  . ARTERIOVENOUS GRAFT PLACEMENT Right 03/19/2000   upper arm  . ARTERIOVENOUS GRAFT PLACEMENT Left 12/03/2000   thigh  . CARPAL TUNNEL RELEASE Left 03/10/2013   Procedure: CARPAL TUNNEL RELEASE;  Surgeon: Gary R Kuzma, MD;  Location: [facility];  Service: Orthopedics;  Laterality: Left;  .  CARPAL TUNNEL RELEASE Right 03/28/2015   Procedure: RIGHT CARPAL TUNNEL RELEASE;  Surgeon: Gary Kuzma, MD;  Location: [facility];  Service: Orthopedics;  Laterality: Right;  ANESTHESIA:  IV REGIONAL FAB  . COLONOSCOPY W/ BIOPSIES    . HEMICOLECTOMY  02/1996  . HERNIA REPAIR     laparoscopic repair during a [facility] hospitalization from 03/26/2006-03/30/2006  . INCISIONAL HERNIA REPAIR  04/15/2006   laparoscopic  . INSERTION OF DIALYSIS CATHETER Left 06/06/2000   IJ Quinton catheter  . INSERTION OF DIALYSIS CATHETER Right 06/28/2000   IJ Ash catheter  . INSERTION OF DIALYSIS CATHETER Left 08/13/2000   subclavian Ash catheter  . multiple failed grafts     left thigh AVG 12/03/00, clotted -05/31/03, 01/24/04, 08/28/04, 09/06/04( thrombectomy and revision )Left AVG declot procedure including complete AV shuntogram, 08/29/04 left AV thrombolysis and angioplasty 2012 shunto gram to left thigh AVG  . REMOVAL OF A DIALYSIS CATHETER  05/31/2000   Schon catheter  . REVISION OF ARTERIOVENOUS GORETEX GRAFT Left 06/23/2012   thigh; with exc. pseudoaneurysm  . REVISION OF ARTERIOVENOUS GORETEX GRAFT Left 02/07/2016   Procedure: REVISION OF LEFT THIGH ARTERIOVENOUS GORETEX GRAFT;  Surgeon: Christopher S Dickson, MD;  Location: [facility];  Service: Vascular;  Laterality: Left;  . SHUNTOGRAM Left 02/16/2012   thigh; with angioplasty, venous anastomosis; stent, medial graft pseudoaneurysm  .   SHUNTOGRAM N/A 02/16/2012   Procedure: SHUNTOGRAM;  Surgeon: Vance W Brabham, MD;  Location: [facility];  Service: Cardiovascular;  Laterality: N/A;  . THROMBECTOMY / ARTERIOVENOUS GRAFT REVISION Left 02/07/2016   thigh  . THROMBECTOMY AND REVISION OF ARTERIOVENTOUS (AV) GORETEX  GRAFT Right 06/04/2000   upper arm  . THROMBECTOMY AND REVISION OF ARTERIOVENTOUS (AV) GORETEX  GRAFT Left 09/06/2004   thigh  . TRIGGER FINGER RELEASE Right 03/28/2015   Procedure: RELEASE A-1 PULLEY RIGHT THUMB;  Surgeon: Gary Kuzma, MD;   Location: [facility];  Service: Orthopedics;  Laterality: Right;    Current Outpatient Prescriptions  Medication Sig Dispense Refill  . acetaminophen (TYLENOL) 500 MG tablet Take 500 mg by mouth 2 (two) times daily as needed for moderate pain (for back).    . cinacalcet (SENSIPAR) 90 MG tablet Take 90 mg by mouth 2 (two) times daily.     . ketotifen (ZADITOR) 0.025 % ophthalmic solution Place 1 drop into both eyes daily as needed (for dry eyes).    . multivitamin (RENA-VIT) TABS tablet Take 1 tablet by mouth daily.    . Na Sulfate-K Sulfate-Mg Sulf (SUPREP BOWEL PREP KIT) 17.5-3.13-1.6 GM/180ML SOLN suprep as directed.  No substitutions 354 mL 0  . NONFORMULARY OR COMPOUNDED ITEM Pharmazen compound: Nail Fungus - Complex Nail and Foot Disorders - Fluconazole 1%, DMSO 25%, Fluocinonide 0.05%, Ketoconazole 2%, apply 1-2 pumps to each affected nail beds and feet twice daily. 120 each 11  . Nutritional Supplements (FEEDING SUPPLEMENT, NEPRO CARB STEADY,) LIQD Take 237 mLs by mouth daily.    . sevelamer (RENVELA) 800 MG tablet Take 800-4,000 mg by mouth 5 (five) times daily. 800-1600 mg with snacks and 4000 mg with meals     No current facility-administered medications for this visit.     Allergies as of 04/29/2016  . (No Known Allergies)    Family History  Problem Relation Age of Onset  . Cancer Mother     COLON  . Colon cancer Mother 77    Social History   Social History  . Marital status: Divorced    Spouse name: N/A  . Number of children: N/A  . Years of education: N/A   Occupational History  . Not on file.   Social History Main Topics  . Smoking status: Former Smoker    Packs/day: 0.10    Types: Cigarettes    Quit date: 02/03/1978  . Smokeless tobacco: Never Used  . Alcohol use 0.0 oz/week     Comment: rare  . Drug use: No  . Sexual activity: Not on file   Other Topics Concern  . Not on file   Social History Narrative  . No narrative on file      Physical Exam: BP 130/64 (BP Location: Left Arm, Patient Position: Sitting, Cuff Size: Normal)   Pulse 64   Ht 5' 2" (1.575 m)   Wt 195 lb (88.5 kg)   LMP 02/16/2012   BMI 35.67 kg/m  Constitutional: generally well-appearing Psychiatric: alert and oriented x3 Eyes: extraocular movements intact Mouth: oral pharynx moist, no lesions Neck: supple no lymphadenopathy Cardiovascular: heart regular rate and rhythm Lungs: clear to auscultation bilaterally Abdomen: soft, nontender, nondistended, no obvious ascites, no peritoneal signs, normal bowel sounds Extremities: no lower extremity edema bilaterally Skin: no lesions on visible extremities   Assessment and plan: 69 y.o. female with  family history colon cancer, personal history of adenomatous colon polyp  She is on hemodialysis 3 times   per week. Given her very likely difficult IV access I would like to schedule this at the hospital, on an upcoming Thursday. We will have this colonoscopy scheduled as a first case. I will allow her plenty of time to recover before she has to get to hemodialysis later that day. She is actually going to contact her dialysis center and see if she can be set up for a early afternoon spot instead of her usual late morning hemodialysis spots. I see no reason for any further blood tests or imaging studies prior to this.   Daniel Jacobs, MD [facility] 04/29/2016, 11:17 AM  Cc: Dunham, Cynthia, MD  

## 2016-04-29 NOTE — Patient Instructions (Signed)
You will be set up for a colonoscopy at [facility] on an upcoming Thursday (first case in AM so that you will have time to get to HD later that day.

## 2016-04-30 DIAGNOSIS — D509 Iron deficiency anemia, unspecified: Secondary | ICD-10-CM | POA: Diagnosis not present

## 2016-04-30 DIAGNOSIS — N2581 Secondary hyperparathyroidism of renal origin: Secondary | ICD-10-CM | POA: Diagnosis not present

## 2016-04-30 DIAGNOSIS — N186 End stage renal disease: Secondary | ICD-10-CM | POA: Diagnosis not present

## 2016-05-02 DIAGNOSIS — N2581 Secondary hyperparathyroidism of renal origin: Secondary | ICD-10-CM | POA: Diagnosis not present

## 2016-05-02 DIAGNOSIS — D509 Iron deficiency anemia, unspecified: Secondary | ICD-10-CM | POA: Diagnosis not present

## 2016-05-02 DIAGNOSIS — N186 End stage renal disease: Secondary | ICD-10-CM | POA: Diagnosis not present

## 2016-05-05 ENCOUNTER — Encounter (HOSPITAL_COMMUNITY): Payer: Self-pay | Admitting: *Deleted

## 2016-05-05 DIAGNOSIS — N186 End stage renal disease: Secondary | ICD-10-CM | POA: Diagnosis not present

## 2016-05-05 DIAGNOSIS — N2581 Secondary hyperparathyroidism of renal origin: Secondary | ICD-10-CM | POA: Diagnosis not present

## 2016-05-05 DIAGNOSIS — D509 Iron deficiency anemia, unspecified: Secondary | ICD-10-CM | POA: Diagnosis not present

## 2016-05-07 DIAGNOSIS — N186 End stage renal disease: Secondary | ICD-10-CM | POA: Diagnosis not present

## 2016-05-07 DIAGNOSIS — D509 Iron deficiency anemia, unspecified: Secondary | ICD-10-CM | POA: Diagnosis not present

## 2016-05-07 DIAGNOSIS — N2581 Secondary hyperparathyroidism of renal origin: Secondary | ICD-10-CM | POA: Diagnosis not present

## 2016-05-09 DIAGNOSIS — N2581 Secondary hyperparathyroidism of renal origin: Secondary | ICD-10-CM | POA: Diagnosis not present

## 2016-05-09 DIAGNOSIS — D509 Iron deficiency anemia, unspecified: Secondary | ICD-10-CM | POA: Diagnosis not present

## 2016-05-09 DIAGNOSIS — N186 End stage renal disease: Secondary | ICD-10-CM | POA: Diagnosis not present

## 2016-05-12 DIAGNOSIS — N186 End stage renal disease: Secondary | ICD-10-CM | POA: Diagnosis not present

## 2016-05-12 DIAGNOSIS — N2581 Secondary hyperparathyroidism of renal origin: Secondary | ICD-10-CM | POA: Diagnosis not present

## 2016-05-12 DIAGNOSIS — D509 Iron deficiency anemia, unspecified: Secondary | ICD-10-CM | POA: Diagnosis not present

## 2016-05-14 ENCOUNTER — Ambulatory Visit (HOSPITAL_COMMUNITY)
Admission: RE | Admit: 2016-05-14 | Discharge: 2016-05-14 | Disposition: A | Discharge status: Home / Self Care | Payer: Medicare Other | Source: Ambulatory Visit | Attending: Gastroenterology | Admitting: Gastroenterology

## 2016-05-14 ENCOUNTER — Encounter (HOSPITAL_COMMUNITY)
Admission: RE | Disposition: A | Discharge status: Home / Self Care | Payer: Self-pay | Source: Ambulatory Visit | Attending: Gastroenterology

## 2016-05-14 ENCOUNTER — Ambulatory Visit (HOSPITAL_COMMUNITY): Payer: Medicare Other | Admitting: Anesthesiology

## 2016-05-14 ENCOUNTER — Encounter (HOSPITAL_COMMUNITY): Payer: Self-pay | Admitting: Anesthesiology

## 2016-05-14 DIAGNOSIS — N2581 Secondary hyperparathyroidism of renal origin: Secondary | ICD-10-CM | POA: Diagnosis not present

## 2016-05-14 DIAGNOSIS — I120 Hypertensive chronic kidney disease with stage 5 chronic kidney disease or end stage renal disease: Secondary | ICD-10-CM | POA: Diagnosis not present

## 2016-05-14 DIAGNOSIS — Z860100 Personal history of colon polyps, unspecified: Secondary | ICD-10-CM

## 2016-05-14 DIAGNOSIS — Z800 Family history of malignant neoplasm of digestive organs: Secondary | ICD-10-CM | POA: Insufficient documentation

## 2016-05-14 DIAGNOSIS — Z1211 Encounter for screening for malignant neoplasm of colon: Secondary | ICD-10-CM

## 2016-05-14 DIAGNOSIS — Z9049 Acquired absence of other specified parts of digestive tract: Secondary | ICD-10-CM | POA: Diagnosis not present

## 2016-05-14 DIAGNOSIS — D649 Anemia, unspecified: Secondary | ICD-10-CM | POA: Diagnosis not present

## 2016-05-14 DIAGNOSIS — Z87891 Personal history of nicotine dependence: Secondary | ICD-10-CM | POA: Insufficient documentation

## 2016-05-14 DIAGNOSIS — Z992 Dependence on renal dialysis: Secondary | ICD-10-CM | POA: Diagnosis not present

## 2016-05-14 DIAGNOSIS — D123 Benign neoplasm of transverse colon: Secondary | ICD-10-CM

## 2016-05-14 DIAGNOSIS — K5730 Diverticulosis of large intestine without perforation or abscess without bleeding: Secondary | ICD-10-CM

## 2016-05-14 DIAGNOSIS — Z980 Intestinal bypass and anastomosis status: Secondary | ICD-10-CM | POA: Diagnosis not present

## 2016-05-14 DIAGNOSIS — D126 Benign neoplasm of colon, unspecified: Secondary | ICD-10-CM

## 2016-05-14 DIAGNOSIS — N186 End stage renal disease: Secondary | ICD-10-CM | POA: Insufficient documentation

## 2016-05-14 DIAGNOSIS — Z79899 Other long term (current) drug therapy: Secondary | ICD-10-CM | POA: Insufficient documentation

## 2016-05-14 HISTORY — PX: COLONOSCOPY WITH PROPOFOL: SHX5780

## 2016-05-14 LAB — POCT I-STAT 4, (NA,K, GLUC, HGB,HCT)
GLUCOSE: 81 mg/dL (ref 65–99)
Glucose, Bld: 80 mg/dL (ref 65–99)
HCT: 43 % (ref 36.0–46.0)
HEMATOCRIT: 41 % (ref 36.0–46.0)
HEMOGLOBIN: 14.6 g/dL (ref 12.0–15.0)
Hemoglobin: 13.9 g/dL (ref 12.0–15.0)
POTASSIUM: 4.5 mmol/L (ref 3.5–5.1)
Potassium: 5.8 mmol/L — ABNORMAL HIGH (ref 3.5–5.1)
SODIUM: 142 mmol/L (ref 135–145)
Sodium: 139 mmol/L (ref 135–145)

## 2016-05-14 SURGERY — COLONOSCOPY WITH PROPOFOL
Anesthesia: Monitor Anesthesia Care

## 2016-05-14 MED ORDER — FENTANYL CITRATE (PF) 100 MCG/2ML IJ SOLN
INTRAMUSCULAR | Status: AC
  Filled 2016-05-14: qty 2

## 2016-05-14 MED ORDER — LIDOCAINE HCL (CARDIAC) 20 MG/ML IV SOLN
INTRAVENOUS | Status: AC
  Filled 2016-05-14: qty 5

## 2016-05-14 MED ORDER — PHENYLEPHRINE HCL 10 MG/ML IJ SOLN
INTRAMUSCULAR | Status: DC | PRN
  Administered 2016-05-14: 80 ug via INTRAVENOUS

## 2016-05-14 MED ORDER — EPHEDRINE SULFATE 50 MG/ML IJ SOLN
INTRAMUSCULAR | Status: DC | PRN
  Administered 2016-05-14: 10 mg via INTRAVENOUS

## 2016-05-14 MED ORDER — SODIUM CHLORIDE 0.9 % IV SOLN
INTRAVENOUS | Status: DC
Start: 2016-05-14 — End: 2016-05-14
  Administered 2016-05-14: 07:00:00 via INTRAVENOUS

## 2016-05-14 MED ORDER — SODIUM CHLORIDE 0.9 % IJ SOLN
INTRAMUSCULAR | Status: AC
  Filled 2016-05-14: qty 10

## 2016-05-14 MED ORDER — PROPOFOL 10 MG/ML IV BOLUS
INTRAVENOUS | Status: DC | PRN
  Administered 2016-05-14 (×3): 20 mg via INTRAVENOUS

## 2016-05-14 MED ORDER — PROPOFOL 10 MG/ML IV BOLUS
INTRAVENOUS | Status: AC
  Filled 2016-05-14: qty 40

## 2016-05-14 MED ORDER — PROPOFOL 500 MG/50ML IV EMUL
INTRAVENOUS | Status: DC | PRN
  Administered 2016-05-14: 140 ug/kg/min via INTRAVENOUS

## 2016-05-14 MED ORDER — PHENYLEPHRINE 40 MCG/ML (10ML) SYRINGE FOR IV PUSH (FOR BLOOD PRESSURE SUPPORT)
PREFILLED_SYRINGE | INTRAVENOUS | Status: AC
  Filled 2016-05-14: qty 10

## 2016-05-14 MED ORDER — LIDOCAINE 2% (20 MG/ML) 5 ML SYRINGE
INTRAMUSCULAR | Status: DC | PRN
  Administered 2016-05-14: 50 mg via INTRAVENOUS

## 2016-05-14 MED ORDER — EPHEDRINE SULFATE 50 MG/ML IJ SOLN
INTRAMUSCULAR | Status: AC
  Filled 2016-05-14: qty 1

## 2016-05-14 SURGICAL SUPPLY — 22 items

## 2016-05-14 NOTE — Transfer of Care (Signed)
Immediate Anesthesia Transfer of Care Note  Patient: Sue Neal  Procedure(s) Performed: Procedure(s): COLONOSCOPY WITH PROPOFOL (N/A)  Patient Location: Endoscopy Unit  Anesthesia Type:MAC  Level of Consciousness: sedated  Airway & Oxygen Therapy: Patient Spontanous Breathing and Patient connected to face mask oxygen  Post-op Assessment: Report given to RN and Post -op Vital signs reviewed and stable  Post vital signs: Reviewed and stable  Last Vitals:  Vitals:   05/14/16 0635  BP: (!) 160/54  Pulse: 87  Resp: 15  Temp: 36.7 C    Last Pain:  Vitals:   05/14/16 0635  TempSrc: Oral         Complications: No apparent anesthesia complications

## 2016-05-14 NOTE — H&P (View-Only) (Signed)
HPI: This is a very pleasant 70 year old woman  who was referred to me by Jamal Maes, MD  to evaluate  colon cancer screening .    Chief complaint is family history colon cancer, personal history of adenomatous colon polyp   I was able to find a pathology result from 2001 in [facility] system. This showed a small adenomatous polyp from the colon. I cannot tell from this pathology report who the gastroenterologist was.  She is not really sure who did it, but it was done at [facility] 12/2015 13.7  Her mother had colon cancer, diagnosed when she was 102.  Thinks it should have been discovered sooner than it was.  She passed 2-3 months after it was diagnosed.  No other family history of colon cancer.  She is on HD, Tuesday, thurs and Saturday.  She has no issues with her own bowels. No constipation, no diarrhea, no overt GI bleeding.  Review of systems: Pertinent positive and negative review of systems were noted in the above HPI section. Complete review of systems was performed and was otherwise normal.   Past Medical History:  Diagnosis Date  . Anemia   . Arthritis   . Back pain, chronic   . Blood transfusion without reported diagnosis   . Cataract   . Complication of anesthesia    " I woke up during the procedure."  . ESRD (end stage renal disease) on dialysis ([place])    "TTS; [facility]" (02/07/2016)  . Hyperparathyroidism, secondary ([place])   . Hypertension    off meds now  . 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 Uterine fibroid     Past Surgical History:  Procedure Laterality Date  . ARTERIOVENOUS GRAFT PLACEMENT Right 03/19/2000   upper arm  . ARTERIOVENOUS GRAFT PLACEMENT Left 12/03/2000   thigh  . CARPAL TUNNEL RELEASE Left 03/10/2013   Procedure: CARPAL TUNNEL RELEASE;  Surgeon: Wynonia Sours, MD;  Location: [facility];  Service: Orthopedics;  Laterality: Left;  .  CARPAL TUNNEL RELEASE Right 03/28/2015   Procedure: RIGHT CARPAL TUNNEL RELEASE;  Surgeon: Daryll Brod, MD;  Location: [facility];  Service: Orthopedics;  Laterality: Right;  ANESTHESIA:  IV REGIONAL FAB  . COLONOSCOPY W/ BIOPSIES    . HEMICOLECTOMY  02/1996  . HERNIA REPAIR     laparoscopic repair during a [facility] hospitalization from 03/26/2006-03/30/2006  . INCISIONAL HERNIA REPAIR  04/15/2006   laparoscopic  . INSERTION OF DIALYSIS CATHETER Left 06/06/2000   IJ Quinton catheter  . INSERTION OF DIALYSIS CATHETER Right 06/28/2000   IJ Ash catheter  . INSERTION OF DIALYSIS CATHETER Left 08/13/2000   subclavian Ash catheter  . multiple failed grafts     left thigh AVG 12/03/00, clotted -05/31/03, 01/24/04, 08/28/04, 09/06/04( thrombectomy and revision )Left AVG declot procedure including complete AV shuntogram, 08/29/04 left AV thrombolysis and angioplasty 2012 shunto gram to left thigh AVG  . REMOVAL OF A DIALYSIS CATHETER  05/31/2000   Schon catheter  . REVISION OF ARTERIOVENOUS GORETEX GRAFT Left 06/23/2012   thigh; with exc. pseudoaneurysm  . REVISION OF ARTERIOVENOUS GORETEX GRAFT Left 02/07/2016   Procedure: REVISION OF LEFT THIGH ARTERIOVENOUS GORETEX GRAFT;  Surgeon: Angelia Mould, MD;  Location: [facility];  Service: Vascular;  Laterality: Left;  . SHUNTOGRAM Left 02/16/2012   thigh; with angioplasty, venous anastomosis; stent, medial graft pseudoaneurysm  .  SHUNTOGRAM N/A 02/16/2012   Procedure: Earney Mallet;  Surgeon: Serafina Mitchell, MD;  Location[facility];  Service: Cardiovascular;  Laterality: N/A;  . THROMBECTOMY / ARTERIOVENOUS GRAFT REVISION Left 02/07/2016   thigh  . THROMBECTOMY AND REVISION OF ARTERIOVENTOUS (AV) GORETEX  GRAFT Right 06/04/2000   upper arm  . THROMBECTOMY AND REVISION OF ARTERIOVENTOUS (AV) GORETEX  GRAFT Left 09/06/2004   thigh  . TRIGGER FINGER RELEASE Right 03/28/2015   Procedure: RELEASE A-1 PULLEY RIGHT THUMB;  Surgeon: Daryll Brod, MD;   Location: [facility];  Service: Orthopedics;  Laterality: Right;    Current Outpatient Prescriptions  Medication Sig Dispense Refill  . acetaminophen (TYLENOL) 500 MG tablet Take 500 mg by mouth 2 (two) times daily as needed for moderate pain (for back).    . cinacalcet (SENSIPAR) 90 MG tablet Take 90 mg by mouth 2 (two) times daily.     Marland Kitchen ketotifen (ZADITOR) 0.025 % ophthalmic solution Place 1 drop into both eyes daily as needed (for dry eyes).    . multivitamin (RENA-VIT) TABS tablet Take 1 tablet by mouth daily.    . Na Sulfate-K Sulfate-Mg Sulf (SUPREP BOWEL PREP KIT) 17.5-3.13-1.6 GM/180ML SOLN suprep as directed.  No substitutions 354 mL 0  . NONFORMULARY OR COMPOUNDED ITEM [employer] compound: Nail Fungus - Complex Nail and Foot Disorders - Fluconazole 1%, DMSO 25%, Fluocinonide 0.05%, Ketoconazole 2%, apply 1-2 pumps to each affected nail beds and feet twice daily. 120 each 11  . Nutritional Supplements (FEEDING SUPPLEMENT, NEPRO CARB STEADY,) LIQD Take 237 mLs by mouth daily.    . sevelamer (RENVELA) 800 MG tablet Take 800-4,000 mg by mouth 5 (five) times daily. [phone] mg with snacks and 4000 mg with meals     No current facility-administered medications for this visit.     Allergies as of 04/29/2016  . (No Known Allergies)    Family History  Problem Relation Age of Onset  . Cancer Mother     COLON  . Colon cancer Mother 77    Social History   Social History  . Marital status: Divorced    Spouse name: N/A  . Number of children: N/A  . Years of education: N/A   Occupational History  . Not on file.   Social History Main Topics  . Smoking status: Former Smoker    Packs/day: 0.10    Types: Cigarettes    Quit date: 02/03/1978  . Smokeless tobacco: Never Used  . Alcohol use 0.0 oz/week     Comment: rare  . Drug use: No  . Sexual activity: Not on file   Other Topics Concern  . Not on file   Social History Narrative  . No narrative on file      Physical Exam: BP 130/64 (BP Location: Left Arm, Patient Position: Sitting, Cuff Size: Normal)   Pulse 64   Ht 5' 2" (1.575 m)   Wt 195 lb (88.5 kg)   LMP 02/16/2012   BMI 35.67 kg/m  Constitutional: generally well-appearing Psychiatric: alert and oriented x3 Eyes: extraocular movements intact Mouth: oral pharynx moist, no lesions Neck: supple no lymphadenopathy Cardiovascular: heart regular rate and rhythm Lungs: clear to auscultation bilaterally Abdomen: soft, nontender, nondistended, no obvious ascites, no peritoneal signs, normal bowel sounds Extremities: no lower extremity edema bilaterally Skin: no lesions on visible extremities   Assessment and plan: 70 y.o. female with  family history colon cancer, personal history of adenomatous colon polyp  She is on hemodialysis 3 times  per week. Given her very likely difficult IV access I would like to schedule this at the hospital, on an upcoming Thursday. We will have this colonoscopy scheduled as a first case. I will allow her plenty of time to recover before she has to get to hemodialysis later that day. She is actually going to contact her dialysis center and see if she can be set up for a early afternoon spot instead of her usual late morning hemodialysis spots. I see no reason for any further blood tests or imaging studies prior to this.   Owens Loffler, MD [facility] 04/29/2016, 11:17 AM  Cc: Jamal Maes, MD

## 2016-05-14 NOTE — OR Nursing (Signed)
Pt's BP 75/21.  Dr. Delma Post made aware.  RN, CRNA, Dr Delma Post at bedside.  Pt. Denies any feeling of dizziness , pain, N/V.  Will continue to monitor and report abnormal values to Dr. As needed.  Laverta Baltimore, RN

## 2016-05-14 NOTE — Op Note (Signed)
[facility] Patient Name: Sue Neal Procedure Date: 05/14/2016 MRN: [id number] Attending MD: Milus Banister , MD Date of Birth: 16-Apr-1946 CSN: [id number] Age: 70 Admit Type: Outpatient Procedure:                Colonoscopy Indications:              High risk colon cancer surveillance: Personal                            history of colonic polyps; per Dr. Cristina Gong note she                            had right hemicolectomy around 1997 for large                            adenoma; colonoscoyp 2001 Dr. Cristina Gong found single                            small (75mm) adenoma Providers:                Milus Banister, MD, Cleda Daub, RN, William Dalton, Technician Referring MD:              Medicines:                Monitored Anesthesia Care Complications:            No immediate complications. Estimated blood loss:                            None. Estimated Blood Loss:     Estimated blood loss: none. Procedure:                Pre-Anesthesia Assessment:                           - Prior to the procedure, a History and Physical                            was performed, and patient medications and                            allergies were reviewed. The patient's tolerance of                            previous anesthesia was also reviewed. The risks                            and benefits of the procedure and the sedation                            options and risks were discussed with the patient.                            All questions were  answered, and informed consent                            was obtained. Prior Anticoagulants: The patient has                            taken no previous anticoagulant or antiplatelet                            agents. ASA Grade Assessment: III - A patient with                            severe systemic disease. After reviewing the risks                            and benefits, the patient was deemed in                             satisfactory condition to undergo the procedure.                           After obtaining informed consent, the colonoscope                            was passed under direct vision. Throughout the                            procedure, the patient's blood pressure, pulse, and                            oxygen saturations were monitored continuously. The                            [id number] scope was introduced through                            the anus and advanced to the the ileocolonic                            anastomosis. The colonoscopy was performed without                            difficulty. The patient tolerated the procedure                            well. The quality of the bowel preparation was                            good. The rectum was photographed. Scope In: 7:28:24 AM Scope Out: 7:40:47 AM Total Procedure Duration: 0 hours 12 minutes 23 seconds  Findings:      There was evidence of a prior end-to-side colo-colonic anastomosis in       the proximal transverse colon (right hemicolectomy). This was patent and       was  characterized by healthy appearing mucosa.      A 24 mm polyp was found in the mid transverse colon. The polyp was       semi-pedunculated. The polyp was removed with a hot snare. Resection and       retrieval were complete.      A few small and large-mouthed diverticula were found in the left colon.      The exam was otherwise without abnormality on direct and retroflexion       views. Impression:               - Patent end-to-side colo-colonic anastomosis (from                            remote right hemicolectomy), characterized by                            healthy appearing mucosa.                           - One 24 mm polyp in the mid transverse colon,                            removed with a hot snare. Resected and retrieved.                           - Diverticulosis in the left colon.                            - The examination was otherwise normal on direct                            and retroflexion views. Moderate Sedation:      N/A- Per Anesthesia Care Recommendation:           - Patient has a contact number available for                            emergencies. The signs and symptoms of potential                            delayed complications were discussed with the                            patient. Return to normal activities tomorrow.                            Written discharge instructions were provided to the                            patient.                           - Resume previous diet.                           - Continue present medications.  You will receive a letter within 2-3 weeks with the                            pathology results and my final recommendations.                           If the polyp(s) is proven to be 'pre-cancerous' on                            pathology, you will need repeat colonoscopy in 3                            years. If the polyp(s) is NOT 'precancerous' on                            pathology then you should repeat colon cancer                            screening in 10 years with colonoscopy without need                            for colon cancer screening by any method prior to                            then (including stool testing). Procedure Code(s):        --- Professional ---                           [phone], Colonoscopy, flexible; with removal of                            tumor(s), polyp(s), or other lesion(s) by snare                            technique Diagnosis Code(s):        --- Professional ---                           Z86.010, Personal history of colonic polyps                           Z98.0, Intestinal bypass and anastomosis status                           D12.3, Benign neoplasm of transverse colon (hepatic                            flexure or splenic flexure)                            K57.30, Diverticulosis of large intestine without                            perforation or abscess without bleeding CPT copyright 2016 [employer]. All rights reserved. The codes documented in this report are preliminary and upon coder review may  be revised to meet current compliance requirements. Milus Banister, MD 05/14/2016 7:47:31 AM This report has been signed electronically. Number of Addenda: 0

## 2016-05-14 NOTE — Anesthesia Preprocedure Evaluation (Addendum)
Anesthesia Evaluation  Patient identified by MRN, date of birth, ID band Patient awake    Reviewed: Allergy & Precautions, NPO status , Patient's Chart, lab work & pertinent test results  History of Anesthesia Complications (+) history of anesthetic complications  Airway Mallampati: II  TM Distance: >3 FB Neck ROM: Full    Dental no notable dental hx.    Pulmonary neg pulmonary ROS, Current Smoker,    Pulmonary exam normal breath sounds clear to auscultation       Cardiovascular hypertension, Pt. on medications Normal cardiovascular exam Rhythm:Regular Rate:Normal     Neuro/Psych negative neurological ROS  negative psych ROS   GI/Hepatic negative GI ROS, Neg liver ROS,   Endo/Other  negative endocrine ROS  Renal/GU ESRF and DialysisRenal disease  negative genitourinary   Musculoskeletal  (+) Arthritis ,   Abdominal   Peds negative pediatric ROS (+)  Hematology  (+) anemia ,   Anesthesia Other Findings   Reproductive/Obstetrics negative OB ROS                             Anesthesia Physical Anesthesia Plan  ASA: IV  Anesthesia Plan: MAC   Post-op Pain Management:    Induction: Intravenous  Airway Management Planned: Natural Airway  Additional Equipment:   Intra-op Plan:   Post-operative Plan:   Informed Consent: I have reviewed the patients History and Physical, chart, labs and discussed the procedure including the risks, benefits and alternatives for the proposed anesthesia with the patient or authorized representative who has indicated his/her understanding and acceptance.   Dental advisory given  Plan Discussed with: CRNA  Anesthesia Plan Comments: (ESRD: skipped dialysis yesterday. K 4.5; denies SOB. RA oxygen saturations good. Proceed.)       Anesthesia Quick Evaluation

## 2016-05-14 NOTE — Anesthesia Postprocedure Evaluation (Signed)
Anesthesia Post Note  Patient: Sue Neal  Procedure(s) Performed: Procedure(s) (LRB): COLONOSCOPY WITH PROPOFOL (N/A)  Patient location during evaluation: PACU Anesthesia Type: MAC Level of consciousness: awake and alert Pain management: pain level controlled Vital Signs Assessment: post-procedure vital signs reviewed and stable Respiratory status: spontaneous breathing, nonlabored ventilation, respiratory function stable and patient connected to nasal cannula oxygen Cardiovascular status: stable and blood pressure returned to baseline Anesthetic complications: no    Last Vitals:  Vitals:   05/14/16 0814 05/14/16 0815  BP: (!) 141/52 (!) 141/52  Pulse:  86  Resp:  (!) 21  Temp:      Last Pain:  Vitals:   05/14/16 0748  TempSrc: Oral                 Sagan Wurzel J

## 2016-05-14 NOTE — Discharge Instructions (Signed)

## 2016-05-14 NOTE — Interval H&P Note (Signed)
History and Physical Interval Note:  05/14/2016 7:15 AM  Sue Neal  has presented today for surgery, with the diagnosis of screening/pl  The various methods of treatment have been discussed with the patient and family. After consideration of risks, benefits and other options for treatment, the patient has consented to  Procedure(s): COLONOSCOPY WITH PROPOFOL (N/A) as a surgical intervention .  The patient's history has been reviewed, patient examined, no change in status, stable for surgery.  I have reviewed the patient's chart and labs.  Questions were answered to the patient's satisfaction.     Milus Banister

## 2016-05-15 ENCOUNTER — Encounter (HOSPITAL_COMMUNITY): Payer: Self-pay | Admitting: Gastroenterology

## 2016-05-16 DIAGNOSIS — D509 Iron deficiency anemia, unspecified: Secondary | ICD-10-CM | POA: Diagnosis not present

## 2016-05-16 DIAGNOSIS — N2581 Secondary hyperparathyroidism of renal origin: Secondary | ICD-10-CM | POA: Diagnosis not present

## 2016-05-16 DIAGNOSIS — N186 End stage renal disease: Secondary | ICD-10-CM | POA: Diagnosis not present

## 2016-05-18 ENCOUNTER — Ambulatory Visit: Payer: Medicare Other | Admitting: Podiatry

## 2016-05-18 ENCOUNTER — Encounter: Payer: Self-pay | Admitting: Podiatry

## 2016-05-18 ENCOUNTER — Ambulatory Visit (INDEPENDENT_AMBULATORY_CARE_PROVIDER_SITE_OTHER): Payer: Medicare Other | Admitting: Podiatry

## 2016-05-18 DIAGNOSIS — M79674 Pain in right toe(s): Secondary | ICD-10-CM | POA: Diagnosis not present

## 2016-05-18 DIAGNOSIS — M79675 Pain in left toe(s): Secondary | ICD-10-CM

## 2016-05-18 DIAGNOSIS — B351 Tinea unguium: Secondary | ICD-10-CM

## 2016-05-18 NOTE — Progress Notes (Signed)
Patient ID: Sue Neal, female   DOB: Jan 12, 1946, 70 y.o.   MRN: [id number]  Subjective: 70 year old female presents the office today for concerns of discolored, thick toenails that she cannot trim herself. Denies any systemic complaints such as fevers, chills, nausea, vomiting. No acute changes since last appointment, and no other complaints at this time.   Objective: AAO x3, NAD DP/PT pulses palpable bilaterally, CRT less than 3 seconds Nails appear to be hypertrophic, dystrophic, discolored x 10. There is no surrounding redness or drainage. Tenderness to nails 1-5 bilaterally. No areas of pinpoint bony tenderness or pain with vibratory sensation. MMT 5/5, ROM WNL. No edema, erythema, increase in warmth to bilateral lower extremities.  No open lesions or pre-ulcerative lesions.  No pain with calf compression, swelling, warmth, erythema  Assessment: Symptomatic onychomycosis  Plan: -All treatment options discussed with the patient including all alternatives, risks, complications.  -Nails were debrided without complications or bleeding x 10.  -Continue topical treatment for onychomycosis.  -Follow-up in 3 months or sooner if any problems arise. In the meantime, encouraged to call the office with any questions, concerns, change in symptoms.   Celesta Gentile, DPM

## 2016-05-19 DIAGNOSIS — D509 Iron deficiency anemia, unspecified: Secondary | ICD-10-CM | POA: Diagnosis not present

## 2016-05-19 DIAGNOSIS — N2581 Secondary hyperparathyroidism of renal origin: Secondary | ICD-10-CM | POA: Diagnosis not present

## 2016-05-19 DIAGNOSIS — N186 End stage renal disease: Secondary | ICD-10-CM | POA: Diagnosis not present

## 2016-05-21 DIAGNOSIS — Z992 Dependence on renal dialysis: Secondary | ICD-10-CM | POA: Diagnosis not present

## 2016-05-21 DIAGNOSIS — N2581 Secondary hyperparathyroidism of renal origin: Secondary | ICD-10-CM | POA: Diagnosis not present

## 2016-05-21 DIAGNOSIS — N186 End stage renal disease: Secondary | ICD-10-CM | POA: Diagnosis not present

## 2016-05-21 DIAGNOSIS — Q612 Polycystic kidney, adult type: Secondary | ICD-10-CM | POA: Diagnosis not present

## 2016-05-21 DIAGNOSIS — D509 Iron deficiency anemia, unspecified: Secondary | ICD-10-CM | POA: Diagnosis not present

## 2016-05-23 DIAGNOSIS — D509 Iron deficiency anemia, unspecified: Secondary | ICD-10-CM | POA: Diagnosis not present

## 2016-05-23 DIAGNOSIS — N186 End stage renal disease: Secondary | ICD-10-CM | POA: Diagnosis not present

## 2016-05-26 DIAGNOSIS — D509 Iron deficiency anemia, unspecified: Secondary | ICD-10-CM | POA: Diagnosis not present

## 2016-05-26 DIAGNOSIS — N186 End stage renal disease: Secondary | ICD-10-CM | POA: Diagnosis not present

## 2016-05-30 DIAGNOSIS — N186 End stage renal disease: Secondary | ICD-10-CM | POA: Diagnosis not present

## 2016-05-30 DIAGNOSIS — D509 Iron deficiency anemia, unspecified: Secondary | ICD-10-CM | POA: Diagnosis not present

## 2016-06-02 DIAGNOSIS — D509 Iron deficiency anemia, unspecified: Secondary | ICD-10-CM | POA: Diagnosis not present

## 2016-06-02 DIAGNOSIS — N186 End stage renal disease: Secondary | ICD-10-CM | POA: Diagnosis not present

## 2016-06-04 ENCOUNTER — Other Ambulatory Visit: Payer: Self-pay | Admitting: Nephrology

## 2016-06-04 DIAGNOSIS — D509 Iron deficiency anemia, unspecified: Secondary | ICD-10-CM | POA: Diagnosis not present

## 2016-06-04 DIAGNOSIS — N186 End stage renal disease: Secondary | ICD-10-CM | POA: Diagnosis not present

## 2016-06-04 DIAGNOSIS — R1031 Right lower quadrant pain: Secondary | ICD-10-CM

## 2016-06-06 DIAGNOSIS — D509 Iron deficiency anemia, unspecified: Secondary | ICD-10-CM | POA: Diagnosis not present

## 2016-06-06 DIAGNOSIS — N186 End stage renal disease: Secondary | ICD-10-CM | POA: Diagnosis not present

## 2016-06-08 ENCOUNTER — Ambulatory Visit
Admission: RE | Admit: 2016-06-08 | Discharge: 2016-06-08 | Disposition: A | Discharge status: Home / Self Care | Payer: Medicare Other | Source: Ambulatory Visit | Attending: Nephrology | Admitting: Nephrology

## 2016-06-08 DIAGNOSIS — R109 Unspecified abdominal pain: Secondary | ICD-10-CM | POA: Diagnosis not present

## 2016-06-08 DIAGNOSIS — R1031 Right lower quadrant pain: Secondary | ICD-10-CM

## 2016-06-08 MED ORDER — IOPAMIDOL (ISOVUE-300) INJECTION 61%
100.0000 mL | Freq: Once | INTRAVENOUS | Status: AC | PRN
  Administered 2016-06-08: 100 mL via INTRAVENOUS

## 2016-06-09 DIAGNOSIS — N186 End stage renal disease: Secondary | ICD-10-CM | POA: Diagnosis not present

## 2016-06-09 DIAGNOSIS — D509 Iron deficiency anemia, unspecified: Secondary | ICD-10-CM | POA: Diagnosis not present

## 2016-06-11 DIAGNOSIS — N186 End stage renal disease: Secondary | ICD-10-CM | POA: Diagnosis not present

## 2016-06-11 DIAGNOSIS — D509 Iron deficiency anemia, unspecified: Secondary | ICD-10-CM | POA: Diagnosis not present

## 2016-06-13 DIAGNOSIS — D509 Iron deficiency anemia, unspecified: Secondary | ICD-10-CM | POA: Diagnosis not present

## 2016-06-13 DIAGNOSIS — N186 End stage renal disease: Secondary | ICD-10-CM | POA: Diagnosis not present

## 2016-06-16 DIAGNOSIS — N186 End stage renal disease: Secondary | ICD-10-CM | POA: Diagnosis not present

## 2016-06-16 DIAGNOSIS — D509 Iron deficiency anemia, unspecified: Secondary | ICD-10-CM | POA: Diagnosis not present

## 2016-06-18 DIAGNOSIS — N186 End stage renal disease: Secondary | ICD-10-CM | POA: Diagnosis not present

## 2016-06-18 DIAGNOSIS — D509 Iron deficiency anemia, unspecified: Secondary | ICD-10-CM | POA: Diagnosis not present

## 2016-06-20 DIAGNOSIS — N186 End stage renal disease: Secondary | ICD-10-CM | POA: Diagnosis not present

## 2016-06-20 DIAGNOSIS — D509 Iron deficiency anemia, unspecified: Secondary | ICD-10-CM | POA: Diagnosis not present

## 2016-06-20 DIAGNOSIS — Z992 Dependence on renal dialysis: Secondary | ICD-10-CM | POA: Diagnosis not present

## 2016-06-20 DIAGNOSIS — Q612 Polycystic kidney, adult type: Secondary | ICD-10-CM | POA: Diagnosis not present

## 2016-06-23 DIAGNOSIS — N2581 Secondary hyperparathyroidism of renal origin: Secondary | ICD-10-CM | POA: Diagnosis not present

## 2016-06-23 DIAGNOSIS — D509 Iron deficiency anemia, unspecified: Secondary | ICD-10-CM | POA: Diagnosis not present

## 2016-06-23 DIAGNOSIS — N186 End stage renal disease: Secondary | ICD-10-CM | POA: Diagnosis not present

## 2016-06-25 DIAGNOSIS — N2581 Secondary hyperparathyroidism of renal origin: Secondary | ICD-10-CM | POA: Diagnosis not present

## 2016-06-25 DIAGNOSIS — D509 Iron deficiency anemia, unspecified: Secondary | ICD-10-CM | POA: Diagnosis not present

## 2016-06-25 DIAGNOSIS — N186 End stage renal disease: Secondary | ICD-10-CM | POA: Diagnosis not present

## 2016-06-27 DIAGNOSIS — N2581 Secondary hyperparathyroidism of renal origin: Secondary | ICD-10-CM | POA: Diagnosis not present

## 2016-06-27 DIAGNOSIS — D509 Iron deficiency anemia, unspecified: Secondary | ICD-10-CM | POA: Diagnosis not present

## 2016-06-27 DIAGNOSIS — N186 End stage renal disease: Secondary | ICD-10-CM | POA: Diagnosis not present

## 2016-06-30 DIAGNOSIS — D509 Iron deficiency anemia, unspecified: Secondary | ICD-10-CM | POA: Diagnosis not present

## 2016-06-30 DIAGNOSIS — N186 End stage renal disease: Secondary | ICD-10-CM | POA: Diagnosis not present

## 2016-06-30 DIAGNOSIS — N2581 Secondary hyperparathyroidism of renal origin: Secondary | ICD-10-CM | POA: Diagnosis not present

## 2016-07-02 DIAGNOSIS — D509 Iron deficiency anemia, unspecified: Secondary | ICD-10-CM | POA: Diagnosis not present

## 2016-07-02 DIAGNOSIS — N186 End stage renal disease: Secondary | ICD-10-CM | POA: Diagnosis not present

## 2016-07-02 DIAGNOSIS — N2581 Secondary hyperparathyroidism of renal origin: Secondary | ICD-10-CM | POA: Diagnosis not present

## 2016-07-04 DIAGNOSIS — D509 Iron deficiency anemia, unspecified: Secondary | ICD-10-CM | POA: Diagnosis not present

## 2016-07-04 DIAGNOSIS — N2581 Secondary hyperparathyroidism of renal origin: Secondary | ICD-10-CM | POA: Diagnosis not present

## 2016-07-04 DIAGNOSIS — N186 End stage renal disease: Secondary | ICD-10-CM | POA: Diagnosis not present

## 2016-07-07 DIAGNOSIS — N186 End stage renal disease: Secondary | ICD-10-CM | POA: Diagnosis not present

## 2016-07-07 DIAGNOSIS — N2581 Secondary hyperparathyroidism of renal origin: Secondary | ICD-10-CM | POA: Diagnosis not present

## 2016-07-07 DIAGNOSIS — D509 Iron deficiency anemia, unspecified: Secondary | ICD-10-CM | POA: Diagnosis not present

## 2016-07-09 DIAGNOSIS — D509 Iron deficiency anemia, unspecified: Secondary | ICD-10-CM | POA: Diagnosis not present

## 2016-07-09 DIAGNOSIS — N186 End stage renal disease: Secondary | ICD-10-CM | POA: Diagnosis not present

## 2016-07-09 DIAGNOSIS — N2581 Secondary hyperparathyroidism of renal origin: Secondary | ICD-10-CM | POA: Diagnosis not present

## 2016-07-11 DIAGNOSIS — N2581 Secondary hyperparathyroidism of renal origin: Secondary | ICD-10-CM | POA: Diagnosis not present

## 2016-07-11 DIAGNOSIS — D509 Iron deficiency anemia, unspecified: Secondary | ICD-10-CM | POA: Diagnosis not present

## 2016-07-11 DIAGNOSIS — N186 End stage renal disease: Secondary | ICD-10-CM | POA: Diagnosis not present

## 2016-07-14 DIAGNOSIS — D509 Iron deficiency anemia, unspecified: Secondary | ICD-10-CM | POA: Diagnosis not present

## 2016-07-14 DIAGNOSIS — N186 End stage renal disease: Secondary | ICD-10-CM | POA: Diagnosis not present

## 2016-07-14 DIAGNOSIS — N2581 Secondary hyperparathyroidism of renal origin: Secondary | ICD-10-CM | POA: Diagnosis not present

## 2016-07-17 ENCOUNTER — Encounter: Payer: Self-pay | Admitting: Family

## 2016-07-17 ENCOUNTER — Ambulatory Visit (INDEPENDENT_AMBULATORY_CARE_PROVIDER_SITE_OTHER): Payer: Medicare Other | Admitting: Family

## 2016-07-17 VITALS — BP 120/64 | HR 75 | Temp 97.8°F | Resp 16 | Ht 62.0 in | Wt 196.0 lb

## 2016-07-17 DIAGNOSIS — N186 End stage renal disease: Secondary | ICD-10-CM | POA: Diagnosis not present

## 2016-07-17 DIAGNOSIS — S46002A Unspecified injury of muscle(s) and tendon(s) of the rotator cuff of left shoulder, initial encounter: Secondary | ICD-10-CM | POA: Diagnosis not present

## 2016-07-17 NOTE — Patient Instructions (Signed)
Thank you for choosing [facility].  SUMMARY AND INSTRUCTIONS:  Ice x 20 minutes every 2 hours and before bed.   Medication:  Tylenol as needed for discomfort and before bed.   Your prescription(s) have been submitted to your pharmacy or been printed and provided for you. Please take as directed and contact our office if you believe you are having problem(s) with the medication(s) or have any questions.  Follow up:  If your symptoms worsen or fail to improve, please contact our office for further instruction, or in case of emergency go directly to the emergency room at the closest medical facility.    Surgery for Rotator Cuff Tear With Rehab Rotator cuff surgery is only recommended for individuals who have experienced persistent disability for greater than 3 months of non-surgical (conservative) treatment. Surgery is not necessary but is recommended for individuals who experience difficulty completing daily activities or athletes who are unable to compete. Rotator cuff tears do not usually heal without surgical intervention. If left alone small rotator cuff tears usually become larger. Younger athletes who have a rotator cuff tear may be recommended for surgery without attempting conservative rehabilitation. The purpose of surgery is to regain function of the shoulder joint and eliminate pain associated with the injury. In addition to repairing the tendon tear, the surgery often removes a portion of the bony roof of the shoulder (acromion) as well as the chronically thickened and inflamed membrane below the acromion (subacromial bursa). REASONS NOT TO OPERATE   Infection of the shoulder.  Inability to complete a rehabilitation program.  Patients who have other conditions (emotional or psychological) conditions that contribute to their shoulder condition. RISKS AND COMPLICATIONS  Infection.  Re-tear of the rotator cuff tendons or muscles.  Shoulder stiffness and/or  weakness.  Inability to compete in athletics.  Acromioclavicular (AC) joint paint.  Risks of surgery: infection, bleeding, nerve damage, or damage to surrounding tissues. TECHNIQUE There are different surgical procedures used to treat rotator cuff tears. The type of procedure depends on the extent of injury as well as the surgeon's preference. All of the surgical techniques for rotator cuff tears have the same goal of repairing the torn tendon, removing part of the acromion, and removing the subacromial bursa. There are two main types of procedures: arthroscopic and open incision. Arthroscopic procedures are usually completed and you go home the same day as surgery (outpatient). These procedures use multiple small incisions in which tools and a video camera are placed to work on the shoulder. An electric shaver removes the bursa, then a power burr shaves down the portion of the acromion that places pressure on the rotator cuff. Finally the rotator cuff is sewed (sutured) back to the humeral head. Open incision procedures require a larger incision. The deltoid muscle is detached from the acromion and a ligament in the shoulder (coracoacromial) is cut in order for the surgeon to access the rotator cuff. The subacromial bursa is removed as well as part of the acromion to give the rotator cuff room to move freely. The torn tendon is then sutured to the humeral head. After the rotator cuff is repaired, then the deltoid is reattached and the incision is closed up.  RECOVERY   Post-operative care depends on the surgical technique and the preferences of your therapist.  Keep the wound clean and dry for the first 10 to 14 days after surgery.  Keep your shoulder and arm in the sling provided to you for as long as you have  been instructed to.  You will be given pain medications by your caregiver.  Passive (without using muscles) shoulder movements may be begun immediately after surgery.  It is important  to follow through with you rehabilitation program in order to have the best possible recovery. RETURN TO SPORTS   The rehabilitation period will depend on the sport and position you play as well as the success of the operation.  The minimum recovery period is 6 months.  You must have regained complete shoulder motion and strength before returning to sports. SEEK IMMEDIATE MEDICAL CARE IF:   Any medications produce adverse side effects.  Any complications from surgery occur:  Pain, numbness, or coldness in the extremity operated upon.  Discoloration of the nail beds (they become blue or gray) of the extremity operated upon.  Signs of infections (fever, pain, inflammation, redness, or persistent bleeding). EXERCISES  RANGE OF MOTION (ROM) AND STRETCHING EXERCISES - Rotator Cuff Tear, Surgery For These exercises may help you restore your elbow mobility once your physician has discontinued your immobilization period. Beginning these before your provider's approval may result in delayed healing. Your symptoms may resolve with or without further involvement from your physician, physical therapist or athletic trainer. While completing these exercises, remember:   Restoring tissue flexibility helps normal motion to return to the joints. This allows healthier, less painful movement and activity.  An effective stretch should be held for at least 30 seconds. A stretch should never be painful. You should only feel a gentle lengthening or release in the stretch. ROM - Pendulum   Bend at the waist so that your right / left arm falls away from your body. Support yourself with your opposite hand on a solid surface, such as a table or a countertop.  Your right / left arm should be perpendicular to the ground. If it is not perpendicular, you need to lean over farther. Relax the muscles in your right / left arm and shoulder as much as possible.  Gently sway your hips and trunk so they move your right /  left arm without any use of your right / left shoulder muscles.  Progress your movements so that your right / left arm moves side to side, then forward and backward, and finally, both clockwise and counterclockwise.  Complete __________ repetitions in each direction. Many people use this exercise to relieve discomfort in their shoulder as well as to gain range of motion. Repeat __________ times. Complete this exercise __________ times per day. STRETCH - Flexion, Seated   Sit in a firm chair so that your right / left forearm can rest on a table or on a table or countertop. Your right / left elbow should rest below the height of your shoulder so that your shoulder feels supported and not tense or uncomfortable.  Keeping your right / left shoulder relaxed, lean forward at your waist, allowing your right / left hand to slide forward. Bend forward until you feel a moderate stretch in your shoulder, but before you feel an increase in your pain.  Hold __________ seconds. Slowly return to your starting position. Repeat __________ times. Complete this exercise __________ times per day.  STRETCH - Flexion, Standing   Stand with good posture. With an underhand grip on your right / left and an overhand grip on the opposite hand, grasp a broomstick or cane so that your hands are a little more than shoulder-width apart.  Keeping your right / left elbow straight and shoulder muscles relaxed, push  the stick with your opposite hand to raise your right / left arm in front of your body and then overhead. Raise your arm until you feel a stretch in your right / left shoulder, but before you have increased shoulder pain.  Try to avoid shrugging your right / left shoulder as your arm rises by keeping your shoulder blade tucked down and toward your mid-back spine. Hold __________ seconds.  Slowly return to the starting position. Repeat __________ times. Complete this exercise __________ times per day.  STRETCH -  Abduction, Supine   Stand with good posture. With an underhand grip on your right / left and an overhand grip on the opposite hand, grasp a broomstick or cane so that your hands are a little more than shoulder-width apart.  Keeping your right / left elbow straight and shoulder muscles relaxed, push the stick with your opposite hand to raise your right / left arm out to the side of your body and then overhead. Raise your arm until you feel a stretch in your right / left shoulder, but before you have increased shoulder pain.  Try to avoid shrugging your right / left shoulder as your arm rises by keeping your shoulder blade tucked down and toward your mid-back spine. Hold __________ seconds.  Slowly return to the starting position. Repeat __________ times. Complete this exercise __________ times per day.  ROM - Flexion, Active-Assisted  Lie on your back. You may bend your knees for comfort.  Grasp a broomstick or cane so your hands are about shoulder-width apart. Your right / left hand should grip the end of the stick/cane so that your hand is positioned "thumbs-up," as if you were about to shake hands.  Using your healthy arm to lead, raise your right / left arm overhead until you feel a gentle stretch in your shoulder. Hold __________ seconds.  Use the stick/cane to assist in returning your right / left arm to its starting position. Repeat __________ times. Complete this exercise __________ times per day.  STRETCH - External Rotation   Tuck a folded towel or small ball under your right / left upper arm. Grasp a broomstick or cane with an underhand grasp a little more than shoulder width apart. Bend your elbows to 90 degrees.  Stand with good posture or sit in a chair without arms.  Use your strong arm to push the stick across your body. Do not allow the towel or ball to fall. This will rotate your right / left arm away from your abdomen. Using the stick turn/rotate your hand and forearm away  from your body. Hold __________ seconds. Repeat __________ times. Complete this exercise __________ times per day.  STRENGTHENING EXERCISES - Rotator Cuff Tear, Surgery For These exercises may help you begin to restore your elbow strength in the initial stage of your rehabilitation. Your physician will determine when you begin these exercises depending on the severity of your injury and the integrity of your repaired tissues. Beginning these before your provider's approval may result in delayed healing. While completing these exercises, remember:   Muscles can gain both the endurance and the strength needed for everyday activities through controlled exercises.  Complete these exercises as instructed by your physician, physical therapist or athletic trainer. Progress the resistance and repetitions only as guided.  You may experience muscle soreness or fatigue, but the pain or discomfort you are trying to eliminate should never worsen during these exercises. If this pain does worsen, stop and make certain you  are following the directions exactly. If the pain is still present after adjustments, discontinue the exercise until you can discuss the trouble with your clinician. STRENGTH - Shoulder Flexion, Isometric   With good posture and facing a wall, stand or sit about 4-6 inches away.  Keeping your right / left elbow straight, gently press the top of your fist into the wall. Increase the pressure gradually until you are pressing as hard as you can without shrugging your shoulder or increasing any shoulder discomfort.  Hold __________ seconds. Release the tension slowly. Relax your shoulder muscles completely before you do the next repetition. Repeat __________ times. Complete this exercise __________ times per day.  STRENGTH - Shoulder Abductors, Isometric   With good posture, stand or sit about 4-6 inches from a wall with your right / left side facing the wall.  Bend your right / left elbow.  Gently press your right / left elbow into the wall. Increase the pressure gradually until you are pressing as hard as you can without shrugging your shoulder or increasing any shoulder discomfort.  Hold __________ seconds.  Release the tension slowly. Relax your shoulder muscles completely before you do the next repetition. Repeat __________ times. Complete this exercise __________ times per day.  STRENGTH - Internal Rotators, Isometric   Keep your right / left elbow at your side and bend it 90 degrees.  Step into a door frame so that the inside of your right / left wrist can press against the door frame without your upper arm leaving your side.  Gently press your right / left wrist into the door frame as if you were trying to draw the palm of your hand to your abdomen. Gradually increase the tension until you are pressing as hard as you can without shrugging your shoulder or increasing any shoulder discomfort.  Hold __________ seconds.  Release the tension slowly. Relax your shoulder muscles completely before you do the next repetition. Repeat __________ times. Complete this exercise __________ times per day.  STRENGTH - External Rotators, Isometric   Keep your right / left elbow at your side and bend it 90 degrees.  Step into a door frame so that the outside of your right / left wrist can press against the door frame without your upper arm leaving your side.  Gently press your right / left wrist into the door frame as if you were trying to swing the back of your hand away from your abdomen. Gradually increase the tension until you are pressing as hard as you can without shrugging your shoulder or increasing any shoulder discomfort.  Hold __________ seconds.  Release the tension slowly. Relax your shoulder muscles completely before you do the next repetition. Repeat __________ times. Complete this exercise __________ times per day.    This information is not intended to replace advice  given to you by your health care provider. Make sure you discuss any questions you have with your health care provider.   Document Released: 09/07/2005 Document Revised: 01/22/2015 Document Reviewed: 12/20/2008 Elsevier Interactive Patient Education [employer].

## 2016-07-17 NOTE — Assessment & Plan Note (Signed)
End-stage renal disease stable with current management through dialysis on Tuesday-Thursday-Saturday and followed by [facility]. Continue current dosage of Renvela and cinaclet with changes per nephrology. Continue to monitor.

## 2016-07-17 NOTE — Assessment & Plan Note (Addendum)
Symptoms and exam consistent with rotator cuff injury most likely chronic with no significant trauma. Treat conservatively with ice, Tylenol as needed for discomfort, and home exercise therapy. Refer to orthopedics for further assessment and treatment. Follow-up if symptoms worsen or fail to improve prior to referral.

## 2016-07-17 NOTE — Progress Notes (Signed)
Subjective:    Patient ID: Sue Neal, female    DOB: December 11, 1945, 70 y.o.   MRN: [id number]  Chief Complaint  Patient presents with  . Establish Care    left arm is hurting, x1 week, upper part of the arm    HPI:  Sue Neal is a 70 y.o. 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 and presents today for an office visit to establish care.  1.) Upper arm pain - This is a new problem. Associated symptom of pain located in the upper aspect of her upper arm has been going on for about 1 week. Denies any trauma that she can recall. Denies any redness or swelling. 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 Denies chest pain or shortness of breath. Modifying factors include OTC medications which has helped. Course of the symptoms appear to be improving. Timing of the symptoms is generally worse at night. She is left hand dominant. No numbness or tingling.   2.) ESRD - Currently maintained on diaylsis on a TTHS sechdule and followed by [facility]. Current fistula is located in her left thigh without complications. She is also maintained on renal vitamins and phosphate binders.    No Known Allergies    Outpatient Medications Prior to Visit  Medication Sig Dispense Refill  . acetaminophen (TYLENOL) 500 MG tablet Take 500 mg by mouth 2 (two) times daily as needed for moderate pain (for back).    . cinacalcet (SENSIPAR) 90 MG tablet Take 90 mg by mouth daily after supper. TAKES 2 PILLS (TOTALING 180 MG) DAILY AFTER SUPPER    . ketotifen (ZADITOR) 0.025 % ophthalmic solution Place 1 drop into both eyes daily as needed (for dry eyes).    .  multivitamin (RENA-VIT) TABS tablet Take 1 tablet by mouth daily.    . NONFORMULARY OR COMPOUNDED ITEM [employer]armazen compound: Nail Fungus - Complex Nail and Foot Disorders - Fluconazole 1%, DMSO 25%, Fluocinonide 0.05%, Ketoconazole 2%, apply 1-2 pumps to each affected nail beds and feet twice daily. 120 each 11  . Nutritional Supplements (FEEDING SUPPLEMENT, NEPRO CARB STEADY,) LIQD Take 237 mLs by mouth daily.    . sevelamer (RENVELA) 800 MG tablet Take 800-4,000 mg by mouth 5 (five) times daily. [phone] mg with snacks and 4000 mg with meals    . Na Sulfate-K Sulfate-Mg Sulf (SUPREP BOWEL PREP KIT) 17.5-3.13-1.6 GM/180ML SOLN suprep as directed.  No substitutions 354 mL 0   No facility-administered medications prior to visit.       Past Surgical History:  Procedure Laterality Date  . ARTERIOVENOUS GRAFT PLACEMENT Right 03/19/2000   upper arm  . ARTERIOVENOUS GRAFT PLACEMENT Left 12/03/2000   thigh  . CARPAL TUNNEL RELEASE Left 03/10/2013   Procedure: CARPAL TUNNEL RELEASE;  Surgeon: Wynonia Sours, MD;  Location: [place];  Service: Orthopedics;  Laterality: Left;  . CARPAL TUNNEL RELEASE Right 03/28/2015   Procedure: RIGHT CARPAL TUNNEL RELEASE;  Surgeon: Daryll Brod, MD;  Location: [place];  Service: Orthopedics;  Laterality: Right;  ANESTHESIA:  IV REGIONAL FAB  . COLONOSCOPY W/ BIOPSIES    . COLONOSCOPY WITH PROPOFOL N/A 05/14/2016   Procedure: COLONOSCOPY WITH PROPOFOL;  Surgeon: Milus Banister, MD;  Location: [facility];  Service: Endoscopy;  Laterality: N/A;  . HEMICOLECTOMY  02/1996  . HERNIA REPAIR     laparoscopic repair during a [place] hospitalization from 03/26/2006-03/30/2006  . INCISIONAL HERNIA REPAIR  04/15/2006   laparoscopic  . INSERTION OF DIALYSIS CATHETER Left 06/06/2000   IJ [place] catheter  . INSERTION OF DIALYSIS CATHETER Right 06/28/2000   IJ Ash catheter  . INSERTION OF DIALYSIS CATHETER Left 08/13/2000   subclavian Ash  catheter  . multiple failed grafts     left thigh AVG 12/03/00, clotted -05/31/03, 01/24/04, 08/28/04, 09/06/04( thrombectomy and revision )Left AVG declot procedure including complete AV shuntogram, 08/29/04 left AV thrombolysis and angioplasty 2012 shunto gram to left thigh AVG  . REMOVAL OF A DIALYSIS CATHETER  05/31/2000   Schon catheter  . REVISION OF ARTERIOVENOUS GORETEX GRAFT Left 06/23/2012   thigh; with exc. pseudoaneurysm  . REVISION OF ARTERIOVENOUS GORETEX GRAFT Left 02/07/2016   Procedure: REVISION OF LEFT THIGH ARTERIOVENOUS GORETEX GRAFT;  Surgeon: Angelia Mould, MD;  Location: [place];  Service: Vascular;  Laterality: Left;  . SHUNTOGRAM Left 02/16/2012   thigh; with angioplasty, venous anastomosis; stent, medial graft pseudoaneurysm  . SHUNTOGRAM N/A 02/16/2012   Procedure: Earney Mallet;  Surgeon: Serafina Mitchell, MD;  Location: [facility] CATH LAB;  Service: Cardiovascular;  Laterality: N/A;  . THROMBECTOMY / ARTERIOVENOUS GRAFT REVISION Left 02/07/2016   thigh  . THROMBECTOMY AND REVISION OF ARTERIOVENTOUS (AV) GORETEX  GRAFT Right 06/04/2000   upper arm  . THROMBECTOMY AND REVISION OF ARTERIOVENTOUS (AV) GORETEX  GRAFT Left 09/06/2004   thigh  . TRIGGER FINGER RELEASE Right 03/28/2015   Procedure: RELEASE A-1 PULLEY RIGHT THUMB;  Surgeon: Daryll Brod, MD;  Location: [place];  Service: Orthopedics;  Laterality: Right;      Past Medical History:  Diagnosis Date  . Anemia   . Arthritis   . Back pain, chronic   . Blood transfusion without reported diagnosis   . Cataract   . Complication of anesthesia    " I woke up during the procedure."  . ESRD (end stage renal disease) on dialysis ([place])    "TTS; [place]" (02/07/2016)  . Hyperparathyroidism, secondary ([place])   . Hypertension    off meds now  . 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 Uterine fibroid       Review of Systems    Constitutional: Negative for chills and fever.  Musculoskeletal:       Positive for left shoulder pain.   Neurological: Negative for weakness and numbness.      Objective:    BP 120/64 (BP Location: Left Arm, Patient Position: Sitting, Cuff Size: Normal)   Pulse 75   Temp 97.8 F (36.6 C) (Oral)   Resp 16   Ht '5\' 2"'$  (1.575 m)   Wt 196 lb (88.9 kg)   LMP 02/16/2012   SpO2 95%   BMI 35.85 kg/m  Nursing note and vital signs reviewed.  Physical Exam  Constitutional: She is oriented to person, place, and time. She appears well-developed and well-nourished. No distress.  Cardiovascular: Normal rate, regular rhythm, normal heart sounds and intact distal pulses.   Pulmonary/Chest: Effort normal and breath sounds normal.  Musculoskeletal:  Left shoulder - no obvious deformity, discoloration, or edema. Palpable tenderness over supraspinatus tendon and deltoid. Active range of motion severely restricted and  below 90 of flexion and abduction. Passive range of motion to about 120 in flexion and abduction. Muscle strength is 3-4+. Distal pulses and sensation are intact and appropriate no bruits or thrills and previous AV fistulas noted. Positive Michel Bickers; positive empty can.  Neurological: She is alert and oriented to person, place, and time.  Skin: Skin is warm and dry.  Psychiatric: She has a normal mood and affect. Her behavior is normal. Judgment and thought content normal.       Assessment & Plan:   Problem List Items Addressed This Visit      Musculoskeletal and Integument   Rotator cuff injury, left, initial encounter - Primary    Symptoms and exam consistent with rotator cuff injury most likely chronic with no significant trauma. Treat conservatively with ice, Tylenol as needed for discomfort, and home exercise therapy. Refer to orthopedics for further assessment and treatment. Follow-up if symptoms worsen or fail to improve prior to referral.       Relevant Orders    Ambulatory referral to Orthopedic Surgery     Genitourinary   ESRD (end stage renal disease) ([place])    End-stage renal disease stable with current management through dialysis on Tuesday-Thursday-Saturday and followed by [facility]. Continue current dosage of Renvela and cinaclet with changes per nephrology. Continue to monitor.        Other Visit Diagnoses   None.      I have discontinued Ms. Dower's Na Sulfate-K Sulfate-Mg Sulf. I am also having her maintain her multivitamin, sevelamer carbonate, cinacalcet, acetaminophen, ketotifen, feeding supplement (NEPRO CARB STEADY), and NONFORMULARY OR COMPOUNDED ITEM.   No orders of the defined types were placed in this encounter.    Follow-up: Return if symptoms worsen or fail to improve.  Mauricio Po, FNP

## 2016-07-18 DIAGNOSIS — N2581 Secondary hyperparathyroidism of renal origin: Secondary | ICD-10-CM | POA: Diagnosis not present

## 2016-07-18 DIAGNOSIS — N186 End stage renal disease: Secondary | ICD-10-CM | POA: Diagnosis not present

## 2016-07-18 DIAGNOSIS — D509 Iron deficiency anemia, unspecified: Secondary | ICD-10-CM | POA: Diagnosis not present

## 2016-07-21 DIAGNOSIS — D509 Iron deficiency anemia, unspecified: Secondary | ICD-10-CM | POA: Diagnosis not present

## 2016-07-21 DIAGNOSIS — N2581 Secondary hyperparathyroidism of renal origin: Secondary | ICD-10-CM | POA: Diagnosis not present

## 2016-07-21 DIAGNOSIS — N186 End stage renal disease: Secondary | ICD-10-CM | POA: Diagnosis not present

## 2016-07-21 DIAGNOSIS — Z992 Dependence on renal dialysis: Secondary | ICD-10-CM | POA: Diagnosis not present

## 2016-07-21 DIAGNOSIS — Q612 Polycystic kidney, adult type: Secondary | ICD-10-CM | POA: Diagnosis not present

## 2016-07-23 DIAGNOSIS — N186 End stage renal disease: Secondary | ICD-10-CM | POA: Diagnosis not present

## 2016-07-23 DIAGNOSIS — N2581 Secondary hyperparathyroidism of renal origin: Secondary | ICD-10-CM | POA: Diagnosis not present

## 2016-07-23 DIAGNOSIS — D509 Iron deficiency anemia, unspecified: Secondary | ICD-10-CM | POA: Diagnosis not present

## 2016-07-25 DIAGNOSIS — D509 Iron deficiency anemia, unspecified: Secondary | ICD-10-CM | POA: Diagnosis not present

## 2016-07-25 DIAGNOSIS — N2581 Secondary hyperparathyroidism of renal origin: Secondary | ICD-10-CM | POA: Diagnosis not present

## 2016-07-25 DIAGNOSIS — N186 End stage renal disease: Secondary | ICD-10-CM | POA: Diagnosis not present

## 2016-07-27 DIAGNOSIS — M65312 Trigger thumb, left thumb: Secondary | ICD-10-CM | POA: Diagnosis not present

## 2016-07-28 DIAGNOSIS — D509 Iron deficiency anemia, unspecified: Secondary | ICD-10-CM | POA: Diagnosis not present

## 2016-07-28 DIAGNOSIS — N186 End stage renal disease: Secondary | ICD-10-CM | POA: Diagnosis not present

## 2016-07-28 DIAGNOSIS — N2581 Secondary hyperparathyroidism of renal origin: Secondary | ICD-10-CM | POA: Diagnosis not present

## 2016-07-29 DIAGNOSIS — M7542 Impingement syndrome of left shoulder: Secondary | ICD-10-CM | POA: Diagnosis not present

## 2016-07-29 DIAGNOSIS — M75102 Unspecified rotator cuff tear or rupture of left shoulder, not specified as traumatic: Secondary | ICD-10-CM | POA: Diagnosis not present

## 2016-07-29 DIAGNOSIS — M19012 Primary osteoarthritis, left shoulder: Secondary | ICD-10-CM | POA: Diagnosis not present

## 2016-07-30 DIAGNOSIS — N186 End stage renal disease: Secondary | ICD-10-CM | POA: Diagnosis not present

## 2016-07-30 DIAGNOSIS — N2581 Secondary hyperparathyroidism of renal origin: Secondary | ICD-10-CM | POA: Diagnosis not present

## 2016-07-30 DIAGNOSIS — D509 Iron deficiency anemia, unspecified: Secondary | ICD-10-CM | POA: Diagnosis not present

## 2016-08-01 DIAGNOSIS — N186 End stage renal disease: Secondary | ICD-10-CM | POA: Diagnosis not present

## 2016-08-01 DIAGNOSIS — N2581 Secondary hyperparathyroidism of renal origin: Secondary | ICD-10-CM | POA: Diagnosis not present

## 2016-08-01 DIAGNOSIS — D509 Iron deficiency anemia, unspecified: Secondary | ICD-10-CM | POA: Diagnosis not present

## 2016-08-04 DIAGNOSIS — D509 Iron deficiency anemia, unspecified: Secondary | ICD-10-CM | POA: Diagnosis not present

## 2016-08-04 DIAGNOSIS — N2581 Secondary hyperparathyroidism of renal origin: Secondary | ICD-10-CM | POA: Diagnosis not present

## 2016-08-04 DIAGNOSIS — N186 End stage renal disease: Secondary | ICD-10-CM | POA: Diagnosis not present

## 2016-08-06 DIAGNOSIS — D509 Iron deficiency anemia, unspecified: Secondary | ICD-10-CM | POA: Diagnosis not present

## 2016-08-06 DIAGNOSIS — N2581 Secondary hyperparathyroidism of renal origin: Secondary | ICD-10-CM | POA: Diagnosis not present

## 2016-08-06 DIAGNOSIS — N186 End stage renal disease: Secondary | ICD-10-CM | POA: Diagnosis not present

## 2016-08-08 DIAGNOSIS — N2581 Secondary hyperparathyroidism of renal origin: Secondary | ICD-10-CM | POA: Diagnosis not present

## 2016-08-08 DIAGNOSIS — N186 End stage renal disease: Secondary | ICD-10-CM | POA: Diagnosis not present

## 2016-08-08 DIAGNOSIS — D509 Iron deficiency anemia, unspecified: Secondary | ICD-10-CM | POA: Diagnosis not present

## 2016-08-10 DIAGNOSIS — N186 End stage renal disease: Secondary | ICD-10-CM | POA: Diagnosis not present

## 2016-08-10 DIAGNOSIS — D509 Iron deficiency anemia, unspecified: Secondary | ICD-10-CM | POA: Diagnosis not present

## 2016-08-10 DIAGNOSIS — N2581 Secondary hyperparathyroidism of renal origin: Secondary | ICD-10-CM | POA: Diagnosis not present

## 2016-08-12 DIAGNOSIS — N186 End stage renal disease: Secondary | ICD-10-CM | POA: Diagnosis not present

## 2016-08-12 DIAGNOSIS — D509 Iron deficiency anemia, unspecified: Secondary | ICD-10-CM | POA: Diagnosis not present

## 2016-08-12 DIAGNOSIS — N2581 Secondary hyperparathyroidism of renal origin: Secondary | ICD-10-CM | POA: Diagnosis not present

## 2016-08-15 DIAGNOSIS — N186 End stage renal disease: Secondary | ICD-10-CM | POA: Diagnosis not present

## 2016-08-15 DIAGNOSIS — D509 Iron deficiency anemia, unspecified: Secondary | ICD-10-CM | POA: Diagnosis not present

## 2016-08-15 DIAGNOSIS — N2581 Secondary hyperparathyroidism of renal origin: Secondary | ICD-10-CM | POA: Diagnosis not present

## 2016-08-18 DIAGNOSIS — D509 Iron deficiency anemia, unspecified: Secondary | ICD-10-CM | POA: Diagnosis not present

## 2016-08-18 DIAGNOSIS — N186 End stage renal disease: Secondary | ICD-10-CM | POA: Diagnosis not present

## 2016-08-18 DIAGNOSIS — N2581 Secondary hyperparathyroidism of renal origin: Secondary | ICD-10-CM | POA: Diagnosis not present

## 2016-08-20 DIAGNOSIS — Q612 Polycystic kidney, adult type: Secondary | ICD-10-CM | POA: Diagnosis not present

## 2016-08-20 DIAGNOSIS — N186 End stage renal disease: Secondary | ICD-10-CM | POA: Diagnosis not present

## 2016-08-20 DIAGNOSIS — Z992 Dependence on renal dialysis: Secondary | ICD-10-CM | POA: Diagnosis not present

## 2016-08-20 DIAGNOSIS — D509 Iron deficiency anemia, unspecified: Secondary | ICD-10-CM | POA: Diagnosis not present

## 2016-08-20 DIAGNOSIS — N2581 Secondary hyperparathyroidism of renal origin: Secondary | ICD-10-CM | POA: Diagnosis not present

## 2016-08-21 ENCOUNTER — Ambulatory Visit (INDEPENDENT_AMBULATORY_CARE_PROVIDER_SITE_OTHER): Payer: Medicare Other | Admitting: Podiatry

## 2016-08-21 ENCOUNTER — Encounter: Payer: Self-pay | Admitting: Podiatry

## 2016-08-21 VITALS — Ht 62.0 in | Wt 196.0 lb

## 2016-08-21 DIAGNOSIS — M79675 Pain in left toe(s): Secondary | ICD-10-CM | POA: Diagnosis not present

## 2016-08-21 DIAGNOSIS — B351 Tinea unguium: Secondary | ICD-10-CM

## 2016-08-21 DIAGNOSIS — M79674 Pain in right toe(s): Secondary | ICD-10-CM | POA: Diagnosis not present

## 2016-08-21 NOTE — Progress Notes (Signed)
Complaint:  Visit Type: Patient returns to my office for continued preventative foot care services. Complaint: Patient states" my nails have grown long and thick and become painful to walk and wear shoes" . The patient presents for preventative foot care services. No changes to ROS  Podiatric Exam: Vascular: dorsalis pedis and posterior tibial pulses are palpable bilateral. Capillary return is immediate. Temperature gradient is WNL. Skin turgor WNL  Sensorium: Normal Semmes Weinstein monofilament test. Normal tactile sensation bilaterally. Nail Exam: Pt has thick disfigured discolored nails with subungual debris noted bilateral entire nail hallux through fifth toenails Ulcer Exam: There is no evidence of ulcer or pre-ulcerative changes or infection. Orthopedic Exam: Muscle tone and strength are WNL. No limitations in general ROM. No crepitus or effusions noted. Foot type and digits show no abnormalities. Bony prominences are unremarkable. Skin: No Porokeratosis. No infection or ulcers  Diagnosis:  Onychomycosis, , Pain in right toe, pain in left toes  Treatment & Plan Procedures and Treatment: Consent by patient was obtained for treatment procedures. The patient understood the discussion of treatment and procedures well. All questions were answered thoroughly reviewed. Debridement of mycotic and hypertrophic toenails, 1 through 5 bilateral and clearing of subungual debris. No ulceration, no infection noted.  Return Visit-Office Procedure: Patient instructed to return to the office for a follow up visit 3 months   for continued evaluation and treatment.    Noach Calvillo DPM 

## 2016-08-22 DIAGNOSIS — D509 Iron deficiency anemia, unspecified: Secondary | ICD-10-CM | POA: Diagnosis not present

## 2016-08-22 DIAGNOSIS — N186 End stage renal disease: Secondary | ICD-10-CM | POA: Diagnosis not present

## 2016-08-22 DIAGNOSIS — N2581 Secondary hyperparathyroidism of renal origin: Secondary | ICD-10-CM | POA: Diagnosis not present

## 2016-08-25 DIAGNOSIS — N186 End stage renal disease: Secondary | ICD-10-CM | POA: Diagnosis not present

## 2016-08-25 DIAGNOSIS — D509 Iron deficiency anemia, unspecified: Secondary | ICD-10-CM | POA: Diagnosis not present

## 2016-08-25 DIAGNOSIS — N2581 Secondary hyperparathyroidism of renal origin: Secondary | ICD-10-CM | POA: Diagnosis not present

## 2016-08-27 DIAGNOSIS — D509 Iron deficiency anemia, unspecified: Secondary | ICD-10-CM | POA: Diagnosis not present

## 2016-08-27 DIAGNOSIS — N2581 Secondary hyperparathyroidism of renal origin: Secondary | ICD-10-CM | POA: Diagnosis not present

## 2016-08-27 DIAGNOSIS — N186 End stage renal disease: Secondary | ICD-10-CM | POA: Diagnosis not present

## 2016-09-01 DIAGNOSIS — D509 Iron deficiency anemia, unspecified: Secondary | ICD-10-CM | POA: Diagnosis not present

## 2016-09-01 DIAGNOSIS — N186 End stage renal disease: Secondary | ICD-10-CM | POA: Diagnosis not present

## 2016-09-01 DIAGNOSIS — N2581 Secondary hyperparathyroidism of renal origin: Secondary | ICD-10-CM | POA: Diagnosis not present

## 2016-09-03 DIAGNOSIS — N186 End stage renal disease: Secondary | ICD-10-CM | POA: Diagnosis not present

## 2016-09-03 DIAGNOSIS — N2581 Secondary hyperparathyroidism of renal origin: Secondary | ICD-10-CM | POA: Diagnosis not present

## 2016-09-03 DIAGNOSIS — D509 Iron deficiency anemia, unspecified: Secondary | ICD-10-CM | POA: Diagnosis not present

## 2016-09-05 DIAGNOSIS — N2581 Secondary hyperparathyroidism of renal origin: Secondary | ICD-10-CM | POA: Diagnosis not present

## 2016-09-05 DIAGNOSIS — D509 Iron deficiency anemia, unspecified: Secondary | ICD-10-CM | POA: Diagnosis not present

## 2016-09-05 DIAGNOSIS — N186 End stage renal disease: Secondary | ICD-10-CM | POA: Diagnosis not present

## 2016-09-08 DIAGNOSIS — N186 End stage renal disease: Secondary | ICD-10-CM | POA: Diagnosis not present

## 2016-09-08 DIAGNOSIS — D509 Iron deficiency anemia, unspecified: Secondary | ICD-10-CM | POA: Diagnosis not present

## 2016-09-08 DIAGNOSIS — N2581 Secondary hyperparathyroidism of renal origin: Secondary | ICD-10-CM | POA: Diagnosis not present

## 2016-09-10 DIAGNOSIS — N2581 Secondary hyperparathyroidism of renal origin: Secondary | ICD-10-CM | POA: Diagnosis not present

## 2016-09-10 DIAGNOSIS — N186 End stage renal disease: Secondary | ICD-10-CM | POA: Diagnosis not present

## 2016-09-10 DIAGNOSIS — D509 Iron deficiency anemia, unspecified: Secondary | ICD-10-CM | POA: Diagnosis not present

## 2016-09-12 DIAGNOSIS — N186 End stage renal disease: Secondary | ICD-10-CM | POA: Diagnosis not present

## 2016-09-12 DIAGNOSIS — D509 Iron deficiency anemia, unspecified: Secondary | ICD-10-CM | POA: Diagnosis not present

## 2016-09-12 DIAGNOSIS — N2581 Secondary hyperparathyroidism of renal origin: Secondary | ICD-10-CM | POA: Diagnosis not present

## 2016-09-15 ENCOUNTER — Ambulatory Visit: Payer: Medicare Other

## 2016-09-15 ENCOUNTER — Ambulatory Visit (INDEPENDENT_AMBULATORY_CARE_PROVIDER_SITE_OTHER): Payer: Medicare Other

## 2016-09-15 ENCOUNTER — Encounter: Payer: Self-pay | Admitting: Family Medicine

## 2016-09-15 ENCOUNTER — Ambulatory Visit (INDEPENDENT_AMBULATORY_CARE_PROVIDER_SITE_OTHER): Payer: Medicare Other | Admitting: Family Medicine

## 2016-09-15 VITALS — BP 124/82 | HR 98 | Temp 98.1°F | Resp 16 | Ht 62.0 in | Wt 198.4 lb

## 2016-09-15 DIAGNOSIS — M79601 Pain in right arm: Secondary | ICD-10-CM

## 2016-09-15 DIAGNOSIS — M79602 Pain in left arm: Secondary | ICD-10-CM | POA: Diagnosis not present

## 2016-09-15 DIAGNOSIS — N2581 Secondary hyperparathyroidism of renal origin: Secondary | ICD-10-CM | POA: Diagnosis not present

## 2016-09-15 DIAGNOSIS — N186 End stage renal disease: Secondary | ICD-10-CM | POA: Diagnosis not present

## 2016-09-15 DIAGNOSIS — D509 Iron deficiency anemia, unspecified: Secondary | ICD-10-CM | POA: Diagnosis not present

## 2016-09-15 DIAGNOSIS — M19012 Primary osteoarthritis, left shoulder: Secondary | ICD-10-CM | POA: Diagnosis not present

## 2016-09-15 DIAGNOSIS — M19011 Primary osteoarthritis, right shoulder: Secondary | ICD-10-CM | POA: Diagnosis not present

## 2016-09-15 MED ORDER — HYDROCODONE-ACETAMINOPHEN 5-325 MG PO TABS: 1.0000 | ORAL_TABLET | Freq: Four times a day (QID) | ORAL | 0 refills | Status: DC | PRN

## 2016-09-15 NOTE — Patient Instructions (Addendum)
You have been referred to [facility].  They will contact you to schedule an appointment,  You may take hydrocodone-acetaminophen one tablet every 6 hours.   IF you received an x-ray today, you will receive an invoice from [facility]. Please contact [facility] at [phone] with questions or concerns regarding your invoice.   IF you received labwork today, you will receive an invoice from [facility]. Please contact [facility] at [phone] with questions or concerns regarding your invoice.   Our billing staff will not be able to assist you with questions regarding bills from these companies.  You will be contacted with the lab results as soon as they are available. The fastest way to get your results is to activate your My Chart account. Instructions are located on the last page of this paperwork. If you have not heard from [place] regarding the results in 2 weeks, please contact this office.      Shoulder Pain Many things can cause shoulder pain, including:  An injury.  Moving the arm in the same way again and again (overuse).  Joint pain (arthritis). Follow these instructions at home: Take these actions to help with your pain:  Squeeze a soft ball or a foam pad as much as you can. This helps to prevent swelling. It also makes the arm stronger.  Take over-the-counter and prescription medicines only as told by your doctor.  If told, put ice on the area:  Put ice in a plastic bag.  Place a towel between your skin and the bag.  Leave the ice on for 20 minutes, 2-3 times per day. Stop putting on ice if it does not help with the pain.  If you were given a shoulder sling or immobilizer:  Wear it as told.  Remove it to shower or bathe.  Move your arm as little as possible.  Keep your hand moving. This helps prevent swelling. Contact a doctor if:  Your pain gets worse.  Medicine does not help your pain.  You have new pain in your arm, hand, or  fingers. Get help right away if:  Your arm, hand, or fingers:  Tingle.  Are numb.  Are swollen.  Are painful.  Turn white or blue. This information is not intended to replace advice given to you by your health care provider. Make sure you discuss any questions you have with your health care provider. Document Released: 02/24/2008 Document Revised: 05/03/2016 Document Reviewed: 12/31/2014 Elsevier Interactive Patient Education  2017 [employer].

## 2016-09-15 NOTE — Progress Notes (Signed)
Patient ID: Sue Neal, female    DOB: [birth year], 70 y.o.   MRN: [id number]  PCP: Lucrezia Starch, MD  Chief Complaint  Patient presents with  . 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 Chronic problems include: End Stage Renal Disease, hx left rotator cuff injury, diverticulosis.  Picked up something heavy and felt something pop in her right arm. Left arm soreness not pain, from mid arm up to shoulder, worse at night specially if she lays on her left side or if she has to pick up things or lift up items. She was previously evaluated for rotator cuff injury back in October.  No prior hx of blood clots in the arm and they are about 70 years old. Groin graft is used for dialysis   Left hand carpal tunnel repair 1 year ago, middle finger numbness and thumb numbness resulting for that procedure. Two years agoright hand carpal tunnel surgery. Reports both procedures completed Dr. Fredna Dow at [place].   Social History   Social History  . Marital status: Divorced    Spouse name: N/A  . Number of children: 0  . Years of education: 44   Occupational History  . Retired     Social History Main Topics  . Smoking status: Former Smoker    Packs/day: 0.10    Types: Cigarettes    Quit date: 02/03/1978  . Smokeless tobacco: Never Used  . Alcohol use 0.0 oz/week     Comment: rare  . Drug use: No  . Sexual activity: Not on file   Other Topics Concern  . Not on file   Social History Narrative   Fun: Bowel, read, try new restaurants.    Denies abuse and feels safe at home.     Family History  Problem Relation Age of Onset  . Cancer Mother     COLON  . Colon cancer Mother 66     Review of Systems  Patient Active Problem List   Diagnosis Date Noted  . Rotator cuff injury, left, initial encounter 07/17/2016    . Benign adenomatous polyp of large intestine 05/14/2016  . Diverticulosis of colon without hemorrhage 05/14/2016  . History of colonic polyps 05/14/2016  . Special screening for malignant neoplasms, colon   . ESRD (end stage renal disease) ([place]) 02/07/2016  . Pain in limb 10/31/2013  . Numbness of fingers 07/19/2012  . Aftercare following surgery of the circulatory system, [place] 06/21/2012  . Other complications due to other vascular device, implant, and graft 06/21/2012  . End stage renal disease ([place]) 02/04/2012    No Known Allergies  Prior to Admission medications   Medication Sig Start Date End Date Taking? Authorizing Provider  acetaminophen (TYLENOL) 500 MG tablet Take 500 mg by mouth 2 (two) times daily as needed for moderate pain (for back).   Yes Historical Provider, MD  cinacalcet (SENSIPAR) 90 MG tablet Take 90 mg by mouth daily after supper. TAKES 2 PILLS (TOTALING 180 MG) DAILY AFTER SUPPER   Yes Historical Provider, MD  ketotifen (ZADITOR) 0.025 % ophthalmic solution Place 1 drop into both eyes daily as needed (for dry eyes).   Yes Historical Provider, MD  multivitamin (RENA-VIT) TABS tablet Take 1 tablet by mouth daily.   Yes Historical Provider, MD  NONFORMULARY OR COMPOUNDED  ITEM [employer] compound: Nail Fungus - Complex Nail and Foot Disorders - Fluconazole 1%, DMSO 25%, Fluocinonide 0.05%, Ketoconazole 2%, apply 1-2 pumps to each affected nail beds and feet twice daily. 02/14/16  Yes Trula Slade, DPM  Nutritional Supplements (FEEDING SUPPLEMENT, NEPRO CARB STEADY,) LIQD Take 237 mLs by mouth daily.   Yes Historical Provider, MD  sevelamer (RENVELA) 800 MG tablet Take 800-4,000 mg by mouth 5 (five) times daily. [phone] mg with snacks and 4000 mg with meals   Yes Historical Provider, MD    Past Medical, Surgical Family and Social History reviewed and updated.    Objective:   Today's Vitals   09/15/16 0845  BP: 124/82  Pulse: 98  Resp: 16  Temp: 98.1 F (36.7  C)  TempSrc: Oral  SpO2: 94%  Weight: 198 lb 6.4 oz (90 kg)  Height: 5\' 2"  (1.575 m)    Wt Readings from Last 3 Encounters:  09/15/16 198 lb 6.4 oz (90 kg)  08/21/16 196 lb (88.9 kg)  07/17/16 196 lb (88.9 kg)   Physical Exam  Constitutional: She is oriented to person, place, and time. She appears well-developed and well-nourished.  HENT:  Head: Normocephalic and atraumatic.  Cardiovascular: Normal rate, regular rhythm, normal heart sounds and intact distal pulses.   Pulmonary/Chest: Effort normal and breath sounds normal.  Musculoskeletal:       Right shoulder: She exhibits decreased range of motion, tenderness and decreased strength.       Left shoulder: She exhibits decreased range of motion, tenderness and decreased strength.  Neurological: She is alert and oriented to person, place, and time.  Skin: Skin is warm and dry.  Psychiatric: She has a normal mood and affect. Her behavior is normal. Judgment and thought content normal.      Dg Humerus Left  Result Date: 09/15/2016 CLINICAL DATA:  Autumn pain for 2 weeks after lifting heavy object EXAM: LEFT HUMERUS - 2+ VIEW COMPARISON:  Left shoulder films of 02/01/2007 FINDINGS: There is moderate degenerative joint disease the left shoulder with loss of joint space and sclerosis with spurring. There is a downward sloping acromion with acromial spurring suggesting impingement and possibly chronic rotator cuff disease. The left humerus is intact. Surgical clips overlie the left antecubital region. IMPRESSION: 1. 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 2. No acute abnormality of the left humerus. Electronically Signed   By: Ivar Drape M.D.   On: 09/15/2016 09:39   Dg Humerus Right  Result Date: 09/15/2016 CLINICAL DATA:  Shoulder popped 2 weeks ago after lifting heavy object EXAM: RIGHT HUMERUS - 2+ VIEW COMPARISON:  None. FINDINGS: There is moderate degenerative joint disease of the right shoulder with some loss of  joint space and sclerosis with spurring. No acute fracture is seen. The acromion mall also is downward sloping with mild spurring which can predispose to impingement and chronic rotator cuff disease. The right humerus however is intact. There are surgical clips overlying the right axilla. IMPRESSION: 1. Moderate degenerative joint disease of the right shoulder. 2. No acute abnormality of the right humerus. Electronically Signed   By: Ivar Drape M.D.   On: 09/15/2016 09:37   Assessment & Plan:  1. Bilateral arm pain - DG Humerus Right - DG Humerus Left Plan: - AMB referral to orthopedics -Hydrocodone-acetaminophen (Norco) 5-325 mg, 1 tablet, every 6 hours as  needed.  Carroll Sage. Kenton Kingfisher, MSN, FNP-C [facility]

## 2016-09-18 DIAGNOSIS — S46211A Strain of muscle, fascia and tendon of other parts of biceps, right arm, initial encounter: Secondary | ICD-10-CM | POA: Diagnosis not present

## 2016-09-19 DIAGNOSIS — N2581 Secondary hyperparathyroidism of renal origin: Secondary | ICD-10-CM | POA: Diagnosis not present

## 2016-09-19 DIAGNOSIS — N186 End stage renal disease: Secondary | ICD-10-CM | POA: Diagnosis not present

## 2016-09-19 DIAGNOSIS — D509 Iron deficiency anemia, unspecified: Secondary | ICD-10-CM | POA: Diagnosis not present

## 2016-09-20 DIAGNOSIS — N186 End stage renal disease: Secondary | ICD-10-CM | POA: Diagnosis not present

## 2016-09-20 DIAGNOSIS — Q612 Polycystic kidney, adult type: Secondary | ICD-10-CM | POA: Diagnosis not present

## 2016-09-20 DIAGNOSIS — Z992 Dependence on renal dialysis: Secondary | ICD-10-CM | POA: Diagnosis not present

## 2016-09-22 DIAGNOSIS — N186 End stage renal disease: Secondary | ICD-10-CM | POA: Diagnosis not present

## 2016-09-22 DIAGNOSIS — N2581 Secondary hyperparathyroidism of renal origin: Secondary | ICD-10-CM | POA: Diagnosis not present

## 2016-09-22 DIAGNOSIS — D631 Anemia in chronic kidney disease: Secondary | ICD-10-CM | POA: Diagnosis not present

## 2016-09-22 DIAGNOSIS — Z992 Dependence on renal dialysis: Secondary | ICD-10-CM | POA: Diagnosis not present

## 2016-09-26 DIAGNOSIS — D631 Anemia in chronic kidney disease: Secondary | ICD-10-CM | POA: Diagnosis not present

## 2016-09-26 DIAGNOSIS — N186 End stage renal disease: Secondary | ICD-10-CM | POA: Diagnosis not present

## 2016-09-26 DIAGNOSIS — N2581 Secondary hyperparathyroidism of renal origin: Secondary | ICD-10-CM | POA: Diagnosis not present

## 2016-09-26 DIAGNOSIS — Z992 Dependence on renal dialysis: Secondary | ICD-10-CM | POA: Diagnosis not present

## 2016-09-29 DIAGNOSIS — N186 End stage renal disease: Secondary | ICD-10-CM | POA: Diagnosis not present

## 2016-09-29 DIAGNOSIS — N2581 Secondary hyperparathyroidism of renal origin: Secondary | ICD-10-CM | POA: Diagnosis not present

## 2016-09-29 DIAGNOSIS — Z992 Dependence on renal dialysis: Secondary | ICD-10-CM | POA: Diagnosis not present

## 2016-09-29 DIAGNOSIS — D631 Anemia in chronic kidney disease: Secondary | ICD-10-CM | POA: Diagnosis not present

## 2016-10-01 DIAGNOSIS — N2581 Secondary hyperparathyroidism of renal origin: Secondary | ICD-10-CM | POA: Diagnosis not present

## 2016-10-01 DIAGNOSIS — N186 End stage renal disease: Secondary | ICD-10-CM | POA: Diagnosis not present

## 2016-10-01 DIAGNOSIS — D631 Anemia in chronic kidney disease: Secondary | ICD-10-CM | POA: Diagnosis not present

## 2016-10-01 DIAGNOSIS — Z992 Dependence on renal dialysis: Secondary | ICD-10-CM | POA: Diagnosis not present

## 2016-10-03 DIAGNOSIS — Z992 Dependence on renal dialysis: Secondary | ICD-10-CM | POA: Diagnosis not present

## 2016-10-03 DIAGNOSIS — N186 End stage renal disease: Secondary | ICD-10-CM | POA: Diagnosis not present

## 2016-10-03 DIAGNOSIS — N2581 Secondary hyperparathyroidism of renal origin: Secondary | ICD-10-CM | POA: Diagnosis not present

## 2016-10-03 DIAGNOSIS — D631 Anemia in chronic kidney disease: Secondary | ICD-10-CM | POA: Diagnosis not present

## 2016-10-06 DIAGNOSIS — Z992 Dependence on renal dialysis: Secondary | ICD-10-CM | POA: Diagnosis not present

## 2016-10-06 DIAGNOSIS — N2581 Secondary hyperparathyroidism of renal origin: Secondary | ICD-10-CM | POA: Diagnosis not present

## 2016-10-06 DIAGNOSIS — N186 End stage renal disease: Secondary | ICD-10-CM | POA: Diagnosis not present

## 2016-10-06 DIAGNOSIS — D631 Anemia in chronic kidney disease: Secondary | ICD-10-CM | POA: Diagnosis not present

## 2016-10-10 DIAGNOSIS — N186 End stage renal disease: Secondary | ICD-10-CM | POA: Diagnosis not present

## 2016-10-10 DIAGNOSIS — D631 Anemia in chronic kidney disease: Secondary | ICD-10-CM | POA: Diagnosis not present

## 2016-10-10 DIAGNOSIS — N2581 Secondary hyperparathyroidism of renal origin: Secondary | ICD-10-CM | POA: Diagnosis not present

## 2016-10-10 DIAGNOSIS — Z992 Dependence on renal dialysis: Secondary | ICD-10-CM | POA: Diagnosis not present

## 2016-10-13 DIAGNOSIS — Z992 Dependence on renal dialysis: Secondary | ICD-10-CM | POA: Diagnosis not present

## 2016-10-13 DIAGNOSIS — N2581 Secondary hyperparathyroidism of renal origin: Secondary | ICD-10-CM | POA: Diagnosis not present

## 2016-10-13 DIAGNOSIS — N186 End stage renal disease: Secondary | ICD-10-CM | POA: Diagnosis not present

## 2016-10-13 DIAGNOSIS — D631 Anemia in chronic kidney disease: Secondary | ICD-10-CM | POA: Diagnosis not present

## 2016-10-15 DIAGNOSIS — D631 Anemia in chronic kidney disease: Secondary | ICD-10-CM | POA: Diagnosis not present

## 2016-10-15 DIAGNOSIS — N2581 Secondary hyperparathyroidism of renal origin: Secondary | ICD-10-CM | POA: Diagnosis not present

## 2016-10-15 DIAGNOSIS — Z992 Dependence on renal dialysis: Secondary | ICD-10-CM | POA: Diagnosis not present

## 2016-10-15 DIAGNOSIS — N186 End stage renal disease: Secondary | ICD-10-CM | POA: Diagnosis not present

## 2016-10-17 DIAGNOSIS — D631 Anemia in chronic kidney disease: Secondary | ICD-10-CM | POA: Diagnosis not present

## 2016-10-17 DIAGNOSIS — Z992 Dependence on renal dialysis: Secondary | ICD-10-CM | POA: Diagnosis not present

## 2016-10-17 DIAGNOSIS — N2581 Secondary hyperparathyroidism of renal origin: Secondary | ICD-10-CM | POA: Diagnosis not present

## 2016-10-17 DIAGNOSIS — N186 End stage renal disease: Secondary | ICD-10-CM | POA: Diagnosis not present

## 2016-10-19 DIAGNOSIS — M65332 Trigger finger, left middle finger: Secondary | ICD-10-CM | POA: Diagnosis not present

## 2016-10-19 DIAGNOSIS — M65312 Trigger thumb, left thumb: Secondary | ICD-10-CM | POA: Diagnosis not present

## 2016-10-21 DIAGNOSIS — D631 Anemia in chronic kidney disease: Secondary | ICD-10-CM | POA: Diagnosis not present

## 2016-10-21 DIAGNOSIS — Z992 Dependence on renal dialysis: Secondary | ICD-10-CM | POA: Diagnosis not present

## 2016-10-21 DIAGNOSIS — N2581 Secondary hyperparathyroidism of renal origin: Secondary | ICD-10-CM | POA: Diagnosis not present

## 2016-10-21 DIAGNOSIS — N186 End stage renal disease: Secondary | ICD-10-CM | POA: Diagnosis not present

## 2016-10-21 DIAGNOSIS — Q612 Polycystic kidney, adult type: Secondary | ICD-10-CM | POA: Diagnosis not present

## 2016-10-24 DIAGNOSIS — D631 Anemia in chronic kidney disease: Secondary | ICD-10-CM | POA: Diagnosis not present

## 2016-10-24 DIAGNOSIS — N186 End stage renal disease: Secondary | ICD-10-CM | POA: Diagnosis not present

## 2016-10-24 DIAGNOSIS — N2581 Secondary hyperparathyroidism of renal origin: Secondary | ICD-10-CM | POA: Diagnosis not present

## 2016-10-28 DIAGNOSIS — N2581 Secondary hyperparathyroidism of renal origin: Secondary | ICD-10-CM | POA: Diagnosis not present

## 2016-10-28 DIAGNOSIS — D631 Anemia in chronic kidney disease: Secondary | ICD-10-CM | POA: Diagnosis not present

## 2016-10-28 DIAGNOSIS — N186 End stage renal disease: Secondary | ICD-10-CM | POA: Diagnosis not present

## 2016-10-31 DIAGNOSIS — D631 Anemia in chronic kidney disease: Secondary | ICD-10-CM | POA: Diagnosis not present

## 2016-10-31 DIAGNOSIS — N186 End stage renal disease: Secondary | ICD-10-CM | POA: Diagnosis not present

## 2016-10-31 DIAGNOSIS — N2581 Secondary hyperparathyroidism of renal origin: Secondary | ICD-10-CM | POA: Diagnosis not present

## 2016-11-03 DIAGNOSIS — D631 Anemia in chronic kidney disease: Secondary | ICD-10-CM | POA: Diagnosis not present

## 2016-11-03 DIAGNOSIS — N186 End stage renal disease: Secondary | ICD-10-CM | POA: Diagnosis not present

## 2016-11-03 DIAGNOSIS — N2581 Secondary hyperparathyroidism of renal origin: Secondary | ICD-10-CM | POA: Diagnosis not present

## 2016-11-05 DIAGNOSIS — D631 Anemia in chronic kidney disease: Secondary | ICD-10-CM | POA: Diagnosis not present

## 2016-11-05 DIAGNOSIS — N186 End stage renal disease: Secondary | ICD-10-CM | POA: Diagnosis not present

## 2016-11-05 DIAGNOSIS — N2581 Secondary hyperparathyroidism of renal origin: Secondary | ICD-10-CM | POA: Diagnosis not present

## 2016-11-07 DIAGNOSIS — D631 Anemia in chronic kidney disease: Secondary | ICD-10-CM | POA: Diagnosis not present

## 2016-11-07 DIAGNOSIS — N2581 Secondary hyperparathyroidism of renal origin: Secondary | ICD-10-CM | POA: Diagnosis not present

## 2016-11-07 DIAGNOSIS — N186 End stage renal disease: Secondary | ICD-10-CM | POA: Diagnosis not present

## 2016-11-10 DIAGNOSIS — D631 Anemia in chronic kidney disease: Secondary | ICD-10-CM | POA: Diagnosis not present

## 2016-11-10 DIAGNOSIS — N2581 Secondary hyperparathyroidism of renal origin: Secondary | ICD-10-CM | POA: Diagnosis not present

## 2016-11-10 DIAGNOSIS — N186 End stage renal disease: Secondary | ICD-10-CM | POA: Diagnosis not present

## 2016-11-12 DIAGNOSIS — N2581 Secondary hyperparathyroidism of renal origin: Secondary | ICD-10-CM | POA: Diagnosis not present

## 2016-11-12 DIAGNOSIS — N186 End stage renal disease: Secondary | ICD-10-CM | POA: Diagnosis not present

## 2016-11-12 DIAGNOSIS — D631 Anemia in chronic kidney disease: Secondary | ICD-10-CM | POA: Diagnosis not present

## 2016-11-14 DIAGNOSIS — D631 Anemia in chronic kidney disease: Secondary | ICD-10-CM | POA: Diagnosis not present

## 2016-11-14 DIAGNOSIS — N2581 Secondary hyperparathyroidism of renal origin: Secondary | ICD-10-CM | POA: Diagnosis not present

## 2016-11-14 DIAGNOSIS — N186 End stage renal disease: Secondary | ICD-10-CM | POA: Diagnosis not present

## 2016-11-17 DIAGNOSIS — N2581 Secondary hyperparathyroidism of renal origin: Secondary | ICD-10-CM | POA: Diagnosis not present

## 2016-11-17 DIAGNOSIS — D631 Anemia in chronic kidney disease: Secondary | ICD-10-CM | POA: Diagnosis not present

## 2016-11-17 DIAGNOSIS — N186 End stage renal disease: Secondary | ICD-10-CM | POA: Diagnosis not present

## 2016-11-18 DIAGNOSIS — Z992 Dependence on renal dialysis: Secondary | ICD-10-CM | POA: Diagnosis not present

## 2016-11-18 DIAGNOSIS — Q612 Polycystic kidney, adult type: Secondary | ICD-10-CM | POA: Diagnosis not present

## 2016-11-18 DIAGNOSIS — N186 End stage renal disease: Secondary | ICD-10-CM | POA: Diagnosis not present

## 2016-11-19 DIAGNOSIS — N186 End stage renal disease: Secondary | ICD-10-CM | POA: Diagnosis not present

## 2016-11-19 DIAGNOSIS — N2581 Secondary hyperparathyroidism of renal origin: Secondary | ICD-10-CM | POA: Diagnosis not present

## 2016-11-19 DIAGNOSIS — D631 Anemia in chronic kidney disease: Secondary | ICD-10-CM | POA: Diagnosis not present

## 2016-11-21 DIAGNOSIS — D631 Anemia in chronic kidney disease: Secondary | ICD-10-CM | POA: Diagnosis not present

## 2016-11-21 DIAGNOSIS — N2581 Secondary hyperparathyroidism of renal origin: Secondary | ICD-10-CM | POA: Diagnosis not present

## 2016-11-21 DIAGNOSIS — N186 End stage renal disease: Secondary | ICD-10-CM | POA: Diagnosis not present

## 2016-11-24 DIAGNOSIS — N186 End stage renal disease: Secondary | ICD-10-CM | POA: Diagnosis not present

## 2016-11-24 DIAGNOSIS — N2581 Secondary hyperparathyroidism of renal origin: Secondary | ICD-10-CM | POA: Diagnosis not present

## 2016-11-24 DIAGNOSIS — D631 Anemia in chronic kidney disease: Secondary | ICD-10-CM | POA: Diagnosis not present

## 2016-11-27 ENCOUNTER — Ambulatory Visit: Payer: Medicare Other | Admitting: Podiatry

## 2016-11-28 DIAGNOSIS — N186 End stage renal disease: Secondary | ICD-10-CM | POA: Diagnosis not present

## 2016-11-28 DIAGNOSIS — D631 Anemia in chronic kidney disease: Secondary | ICD-10-CM | POA: Diagnosis not present

## 2016-11-28 DIAGNOSIS — N2581 Secondary hyperparathyroidism of renal origin: Secondary | ICD-10-CM | POA: Diagnosis not present

## 2016-12-01 ENCOUNTER — Ambulatory Visit: Payer: Medicare Other | Admitting: Podiatry

## 2016-12-01 DIAGNOSIS — N186 End stage renal disease: Secondary | ICD-10-CM | POA: Diagnosis not present

## 2016-12-01 DIAGNOSIS — N2581 Secondary hyperparathyroidism of renal origin: Secondary | ICD-10-CM | POA: Diagnosis not present

## 2016-12-01 DIAGNOSIS — D631 Anemia in chronic kidney disease: Secondary | ICD-10-CM | POA: Diagnosis not present

## 2016-12-03 DIAGNOSIS — D631 Anemia in chronic kidney disease: Secondary | ICD-10-CM | POA: Diagnosis not present

## 2016-12-03 DIAGNOSIS — N186 End stage renal disease: Secondary | ICD-10-CM | POA: Diagnosis not present

## 2016-12-03 DIAGNOSIS — N2581 Secondary hyperparathyroidism of renal origin: Secondary | ICD-10-CM | POA: Diagnosis not present

## 2016-12-05 DIAGNOSIS — N186 End stage renal disease: Secondary | ICD-10-CM | POA: Diagnosis not present

## 2016-12-05 DIAGNOSIS — N2581 Secondary hyperparathyroidism of renal origin: Secondary | ICD-10-CM | POA: Diagnosis not present

## 2016-12-05 DIAGNOSIS — D631 Anemia in chronic kidney disease: Secondary | ICD-10-CM | POA: Diagnosis not present

## 2016-12-08 DIAGNOSIS — N2581 Secondary hyperparathyroidism of renal origin: Secondary | ICD-10-CM | POA: Diagnosis not present

## 2016-12-08 DIAGNOSIS — N186 End stage renal disease: Secondary | ICD-10-CM | POA: Diagnosis not present

## 2016-12-08 DIAGNOSIS — D631 Anemia in chronic kidney disease: Secondary | ICD-10-CM | POA: Diagnosis not present

## 2016-12-12 DIAGNOSIS — N186 End stage renal disease: Secondary | ICD-10-CM | POA: Diagnosis not present

## 2016-12-12 DIAGNOSIS — N2581 Secondary hyperparathyroidism of renal origin: Secondary | ICD-10-CM | POA: Diagnosis not present

## 2016-12-12 DIAGNOSIS — D631 Anemia in chronic kidney disease: Secondary | ICD-10-CM | POA: Diagnosis not present

## 2016-12-15 DIAGNOSIS — D631 Anemia in chronic kidney disease: Secondary | ICD-10-CM | POA: Diagnosis not present

## 2016-12-15 DIAGNOSIS — N186 End stage renal disease: Secondary | ICD-10-CM | POA: Diagnosis not present

## 2016-12-15 DIAGNOSIS — N2581 Secondary hyperparathyroidism of renal origin: Secondary | ICD-10-CM | POA: Diagnosis not present

## 2016-12-17 DIAGNOSIS — N186 End stage renal disease: Secondary | ICD-10-CM | POA: Diagnosis not present

## 2016-12-17 DIAGNOSIS — N2581 Secondary hyperparathyroidism of renal origin: Secondary | ICD-10-CM | POA: Diagnosis not present

## 2016-12-17 DIAGNOSIS — D631 Anemia in chronic kidney disease: Secondary | ICD-10-CM | POA: Diagnosis not present

## 2016-12-19 DIAGNOSIS — Q612 Polycystic kidney, adult type: Secondary | ICD-10-CM | POA: Diagnosis not present

## 2016-12-19 DIAGNOSIS — D631 Anemia in chronic kidney disease: Secondary | ICD-10-CM | POA: Diagnosis not present

## 2016-12-19 DIAGNOSIS — N2581 Secondary hyperparathyroidism of renal origin: Secondary | ICD-10-CM | POA: Diagnosis not present

## 2016-12-19 DIAGNOSIS — N186 End stage renal disease: Secondary | ICD-10-CM | POA: Diagnosis not present

## 2016-12-19 DIAGNOSIS — Z992 Dependence on renal dialysis: Secondary | ICD-10-CM | POA: Diagnosis not present

## 2016-12-21 DIAGNOSIS — N2581 Secondary hyperparathyroidism of renal origin: Secondary | ICD-10-CM | POA: Diagnosis not present

## 2016-12-21 DIAGNOSIS — N186 End stage renal disease: Secondary | ICD-10-CM | POA: Diagnosis not present

## 2016-12-22 DIAGNOSIS — N186 End stage renal disease: Secondary | ICD-10-CM | POA: Diagnosis not present

## 2016-12-22 DIAGNOSIS — N2581 Secondary hyperparathyroidism of renal origin: Secondary | ICD-10-CM | POA: Diagnosis not present

## 2016-12-25 ENCOUNTER — Ambulatory Visit (INDEPENDENT_AMBULATORY_CARE_PROVIDER_SITE_OTHER): Payer: Medicare Other | Admitting: Podiatry

## 2016-12-25 ENCOUNTER — Encounter: Payer: Self-pay | Admitting: Podiatry

## 2016-12-25 DIAGNOSIS — M79675 Pain in left toe(s): Secondary | ICD-10-CM

## 2016-12-25 DIAGNOSIS — M79674 Pain in right toe(s): Secondary | ICD-10-CM

## 2016-12-25 DIAGNOSIS — B351 Tinea unguium: Secondary | ICD-10-CM | POA: Diagnosis not present

## 2016-12-25 NOTE — Progress Notes (Signed)
Complaint:  Visit Type: Patient returns to my office for continued preventative foot care services. Complaint: Patient states" my nails have grown long and thick and become painful to walk and wear shoes" . The patient presents for preventative foot care services. No changes to ROS  Podiatric Exam: Vascular: dorsalis pedis and posterior tibial pulses are palpable bilateral. Capillary return is immediate. Temperature gradient is WNL. Skin turgor WNL  Sensorium: Normal Semmes Weinstein monofilament test. Normal tactile sensation bilaterally. Nail Exam: Pt has thick disfigured discolored nails with subungual debris noted bilateral entire nail hallux through fifth toenails Ulcer Exam: There is no evidence of ulcer or pre-ulcerative changes or infection. Orthopedic Exam: Muscle tone and strength are WNL. No limitations in general ROM. No crepitus or effusions noted. Foot type and digits show no abnormalities. Bony prominences are unremarkable. Skin: No Porokeratosis. No infection or ulcers  Diagnosis:  Onychomycosis, , Pain in right toe, pain in left toes  Treatment & Plan Procedures and Treatment: Consent by patient was obtained for treatment procedures. The patient understood the discussion of treatment and procedures well. All questions were answered thoroughly reviewed. Debridement of mycotic and hypertrophic toenails, 1 through 5 bilateral and clearing of subungual debris. No ulceration, no infection noted.  Return Visit-Office Procedure: Patient instructed to return to the office for a follow up visit 3 months   for continued evaluation and treatment.    Parminder Cupples DPM 

## 2016-12-26 DIAGNOSIS — N186 End stage renal disease: Secondary | ICD-10-CM | POA: Diagnosis not present

## 2016-12-26 DIAGNOSIS — N2581 Secondary hyperparathyroidism of renal origin: Secondary | ICD-10-CM | POA: Diagnosis not present

## 2016-12-29 DIAGNOSIS — N2581 Secondary hyperparathyroidism of renal origin: Secondary | ICD-10-CM | POA: Diagnosis not present

## 2016-12-29 DIAGNOSIS — N186 End stage renal disease: Secondary | ICD-10-CM | POA: Diagnosis not present

## 2016-12-31 DIAGNOSIS — N186 End stage renal disease: Secondary | ICD-10-CM | POA: Diagnosis not present

## 2016-12-31 DIAGNOSIS — N2581 Secondary hyperparathyroidism of renal origin: Secondary | ICD-10-CM | POA: Diagnosis not present

## 2017-01-02 DIAGNOSIS — N2581 Secondary hyperparathyroidism of renal origin: Secondary | ICD-10-CM | POA: Diagnosis not present

## 2017-01-02 DIAGNOSIS — N186 End stage renal disease: Secondary | ICD-10-CM | POA: Diagnosis not present

## 2017-01-05 DIAGNOSIS — N2581 Secondary hyperparathyroidism of renal origin: Secondary | ICD-10-CM | POA: Diagnosis not present

## 2017-01-05 DIAGNOSIS — N186 End stage renal disease: Secondary | ICD-10-CM | POA: Diagnosis not present

## 2017-01-07 DIAGNOSIS — N186 End stage renal disease: Secondary | ICD-10-CM | POA: Diagnosis not present

## 2017-01-07 DIAGNOSIS — N2581 Secondary hyperparathyroidism of renal origin: Secondary | ICD-10-CM | POA: Diagnosis not present

## 2017-01-09 DIAGNOSIS — N186 End stage renal disease: Secondary | ICD-10-CM | POA: Diagnosis not present

## 2017-01-09 DIAGNOSIS — N2581 Secondary hyperparathyroidism of renal origin: Secondary | ICD-10-CM | POA: Diagnosis not present

## 2017-01-12 DIAGNOSIS — N186 End stage renal disease: Secondary | ICD-10-CM | POA: Diagnosis not present

## 2017-01-12 DIAGNOSIS — N2581 Secondary hyperparathyroidism of renal origin: Secondary | ICD-10-CM | POA: Diagnosis not present

## 2017-01-14 DIAGNOSIS — N2581 Secondary hyperparathyroidism of renal origin: Secondary | ICD-10-CM | POA: Diagnosis not present

## 2017-01-14 DIAGNOSIS — N186 End stage renal disease: Secondary | ICD-10-CM | POA: Diagnosis not present

## 2017-01-16 DIAGNOSIS — N186 End stage renal disease: Secondary | ICD-10-CM | POA: Diagnosis not present

## 2017-01-16 DIAGNOSIS — N2581 Secondary hyperparathyroidism of renal origin: Secondary | ICD-10-CM | POA: Diagnosis not present

## 2017-01-18 DIAGNOSIS — N186 End stage renal disease: Secondary | ICD-10-CM | POA: Diagnosis not present

## 2017-01-18 DIAGNOSIS — Q612 Polycystic kidney, adult type: Secondary | ICD-10-CM | POA: Diagnosis not present

## 2017-01-18 DIAGNOSIS — Z992 Dependence on renal dialysis: Secondary | ICD-10-CM | POA: Diagnosis not present

## 2017-01-19 DIAGNOSIS — N186 End stage renal disease: Secondary | ICD-10-CM | POA: Diagnosis not present

## 2017-01-19 DIAGNOSIS — N2581 Secondary hyperparathyroidism of renal origin: Secondary | ICD-10-CM | POA: Diagnosis not present

## 2017-01-23 DIAGNOSIS — N186 End stage renal disease: Secondary | ICD-10-CM | POA: Diagnosis not present

## 2017-01-23 DIAGNOSIS — N2581 Secondary hyperparathyroidism of renal origin: Secondary | ICD-10-CM | POA: Diagnosis not present

## 2017-01-26 DIAGNOSIS — N186 End stage renal disease: Secondary | ICD-10-CM | POA: Diagnosis not present

## 2017-01-26 DIAGNOSIS — N2581 Secondary hyperparathyroidism of renal origin: Secondary | ICD-10-CM | POA: Diagnosis not present

## 2017-01-28 DIAGNOSIS — N186 End stage renal disease: Secondary | ICD-10-CM | POA: Diagnosis not present

## 2017-01-28 DIAGNOSIS — N2581 Secondary hyperparathyroidism of renal origin: Secondary | ICD-10-CM | POA: Diagnosis not present

## 2017-01-30 DIAGNOSIS — N186 End stage renal disease: Secondary | ICD-10-CM | POA: Diagnosis not present

## 2017-01-30 DIAGNOSIS — N2581 Secondary hyperparathyroidism of renal origin: Secondary | ICD-10-CM | POA: Diagnosis not present

## 2017-02-02 DIAGNOSIS — N186 End stage renal disease: Secondary | ICD-10-CM | POA: Diagnosis not present

## 2017-02-02 DIAGNOSIS — N2581 Secondary hyperparathyroidism of renal origin: Secondary | ICD-10-CM | POA: Diagnosis not present

## 2017-02-06 DIAGNOSIS — N186 End stage renal disease: Secondary | ICD-10-CM | POA: Diagnosis not present

## 2017-02-06 DIAGNOSIS — N2581 Secondary hyperparathyroidism of renal origin: Secondary | ICD-10-CM | POA: Diagnosis not present

## 2017-02-09 DIAGNOSIS — N186 End stage renal disease: Secondary | ICD-10-CM | POA: Diagnosis not present

## 2017-02-09 DIAGNOSIS — N2581 Secondary hyperparathyroidism of renal origin: Secondary | ICD-10-CM | POA: Diagnosis not present

## 2017-02-11 DIAGNOSIS — N186 End stage renal disease: Secondary | ICD-10-CM | POA: Diagnosis not present

## 2017-02-11 DIAGNOSIS — N2581 Secondary hyperparathyroidism of renal origin: Secondary | ICD-10-CM | POA: Diagnosis not present

## 2017-02-13 DIAGNOSIS — N186 End stage renal disease: Secondary | ICD-10-CM | POA: Diagnosis not present

## 2017-02-13 DIAGNOSIS — N2581 Secondary hyperparathyroidism of renal origin: Secondary | ICD-10-CM | POA: Diagnosis not present

## 2017-02-16 DIAGNOSIS — N186 End stage renal disease: Secondary | ICD-10-CM | POA: Diagnosis not present

## 2017-02-16 DIAGNOSIS — N2581 Secondary hyperparathyroidism of renal origin: Secondary | ICD-10-CM | POA: Diagnosis not present

## 2017-02-18 DIAGNOSIS — Q612 Polycystic kidney, adult type: Secondary | ICD-10-CM | POA: Diagnosis not present

## 2017-02-18 DIAGNOSIS — Z992 Dependence on renal dialysis: Secondary | ICD-10-CM | POA: Diagnosis not present

## 2017-02-18 DIAGNOSIS — N2581 Secondary hyperparathyroidism of renal origin: Secondary | ICD-10-CM | POA: Diagnosis not present

## 2017-02-18 DIAGNOSIS — N186 End stage renal disease: Secondary | ICD-10-CM | POA: Diagnosis not present

## 2017-02-23 DIAGNOSIS — N186 End stage renal disease: Secondary | ICD-10-CM | POA: Diagnosis not present

## 2017-02-23 DIAGNOSIS — N2581 Secondary hyperparathyroidism of renal origin: Secondary | ICD-10-CM | POA: Diagnosis not present

## 2017-02-23 IMAGING — DX DG HUMERUS 2V *R*
2 series · 2 of 2 positions shown · non-contrast
Comparison: None.

CLINICAL DATA: Shoulder popped 2 weeks ago after lifting heavy
object

EXAM:
RIGHT HUMERUS - 2+ VIEW

[humerus ap]
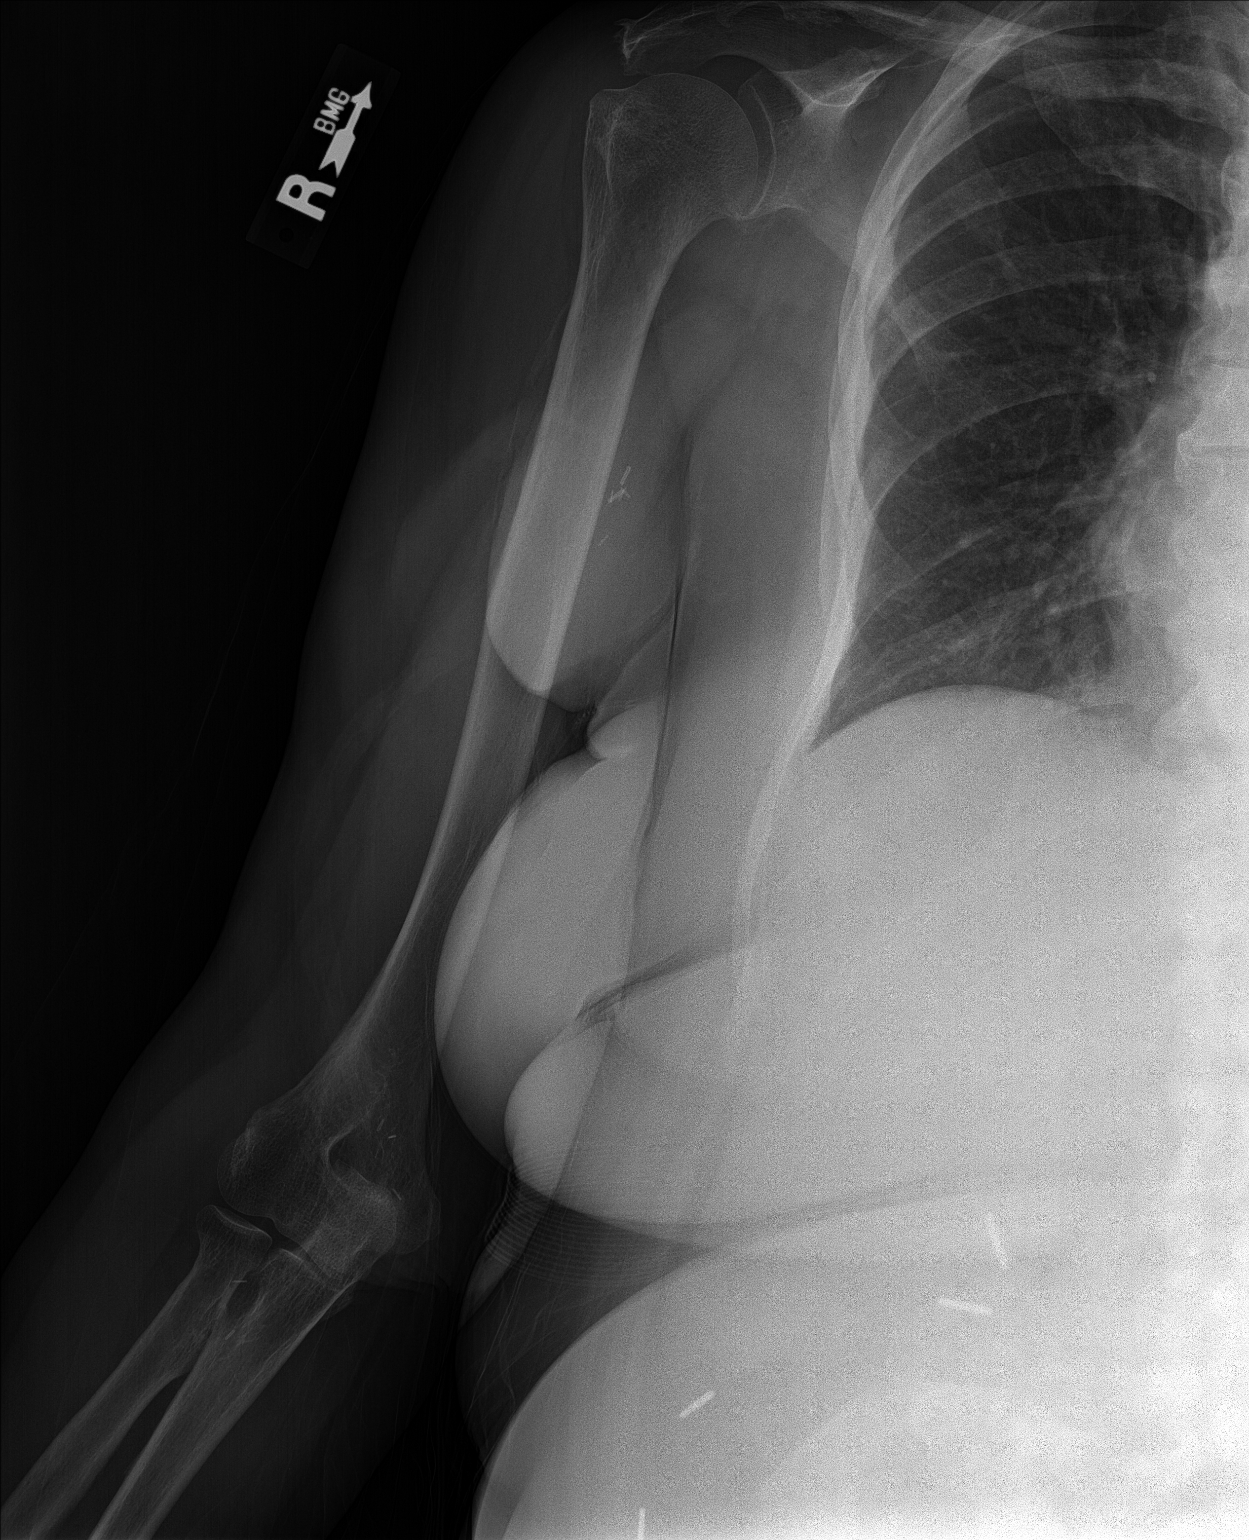

[humerus lat]
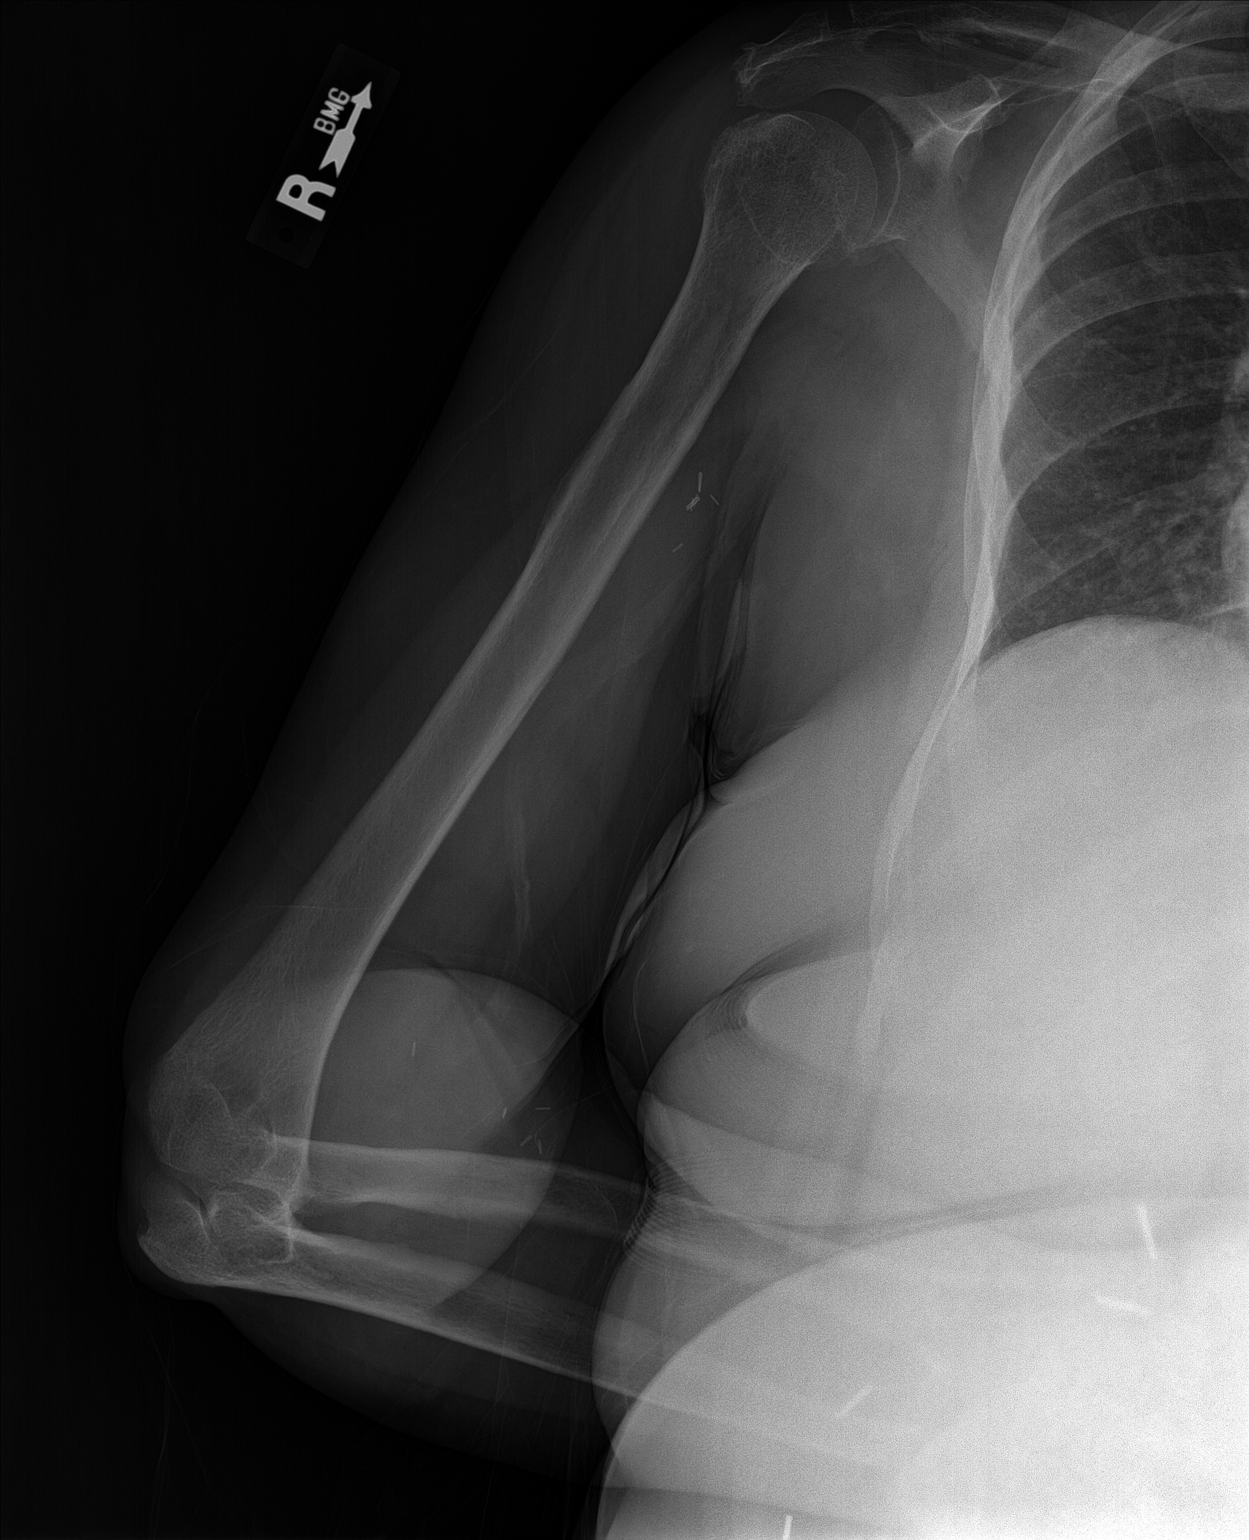

[2 of 2 positions shown; findings below may reference images not displayed]

FINDINGS: There is moderate degenerative joint disease of the right shoulder
with some loss of joint space and sclerosis with spurring. No acute
fracture is seen. The acromion mall also is downward sloping with
mild spurring which can predispose to impingement and chronic
rotator cuff disease. The right humerus however is intact. There are
surgical clips overlying the right axilla.
IMPRESSION: 1. Moderate degenerative joint disease of the right shoulder.
2. No acute abnormality of the right humerus.

## 2017-02-23 IMAGING — DX DG HUMERUS 2V *L*
2 series · 2 of 2 positions shown · non-contrast
Comparison: Left shoulder films of 02/01/2007

CLINICAL DATA: Autumn pain for 2 weeks after lifting heavy object

EXAM:
LEFT HUMERUS - 2+ VIEW

[humerus ap]
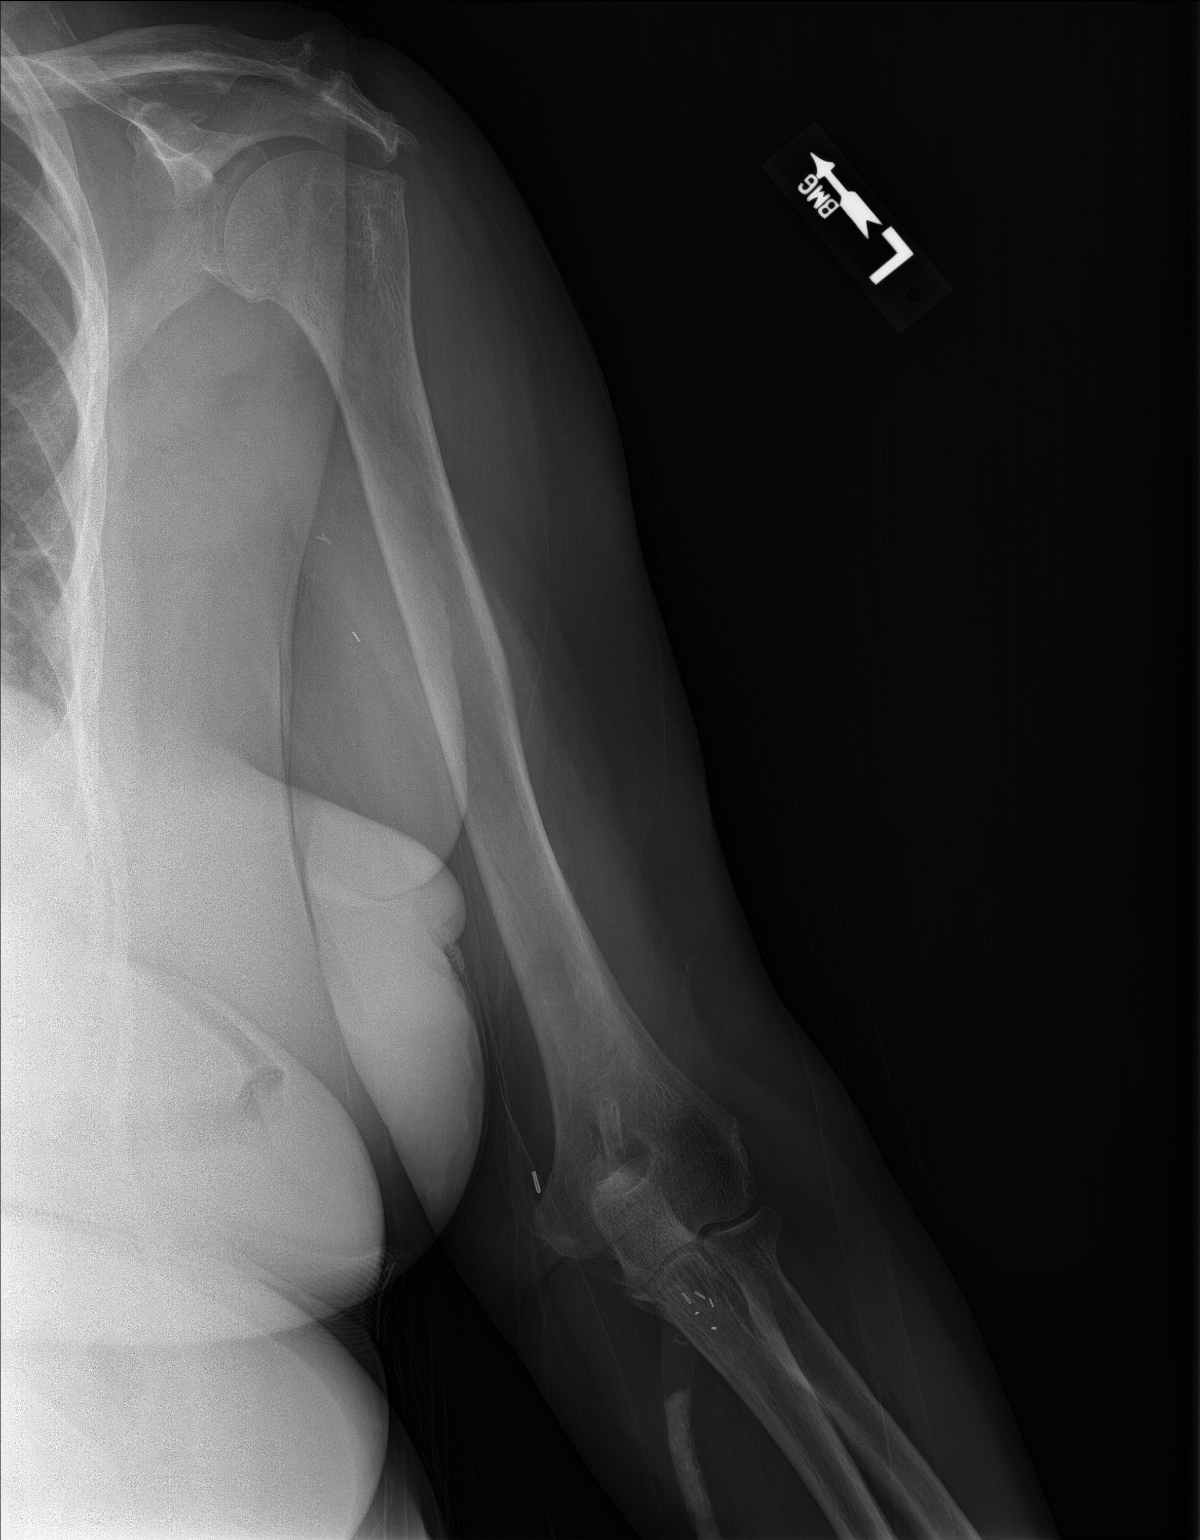

[humerus lat]
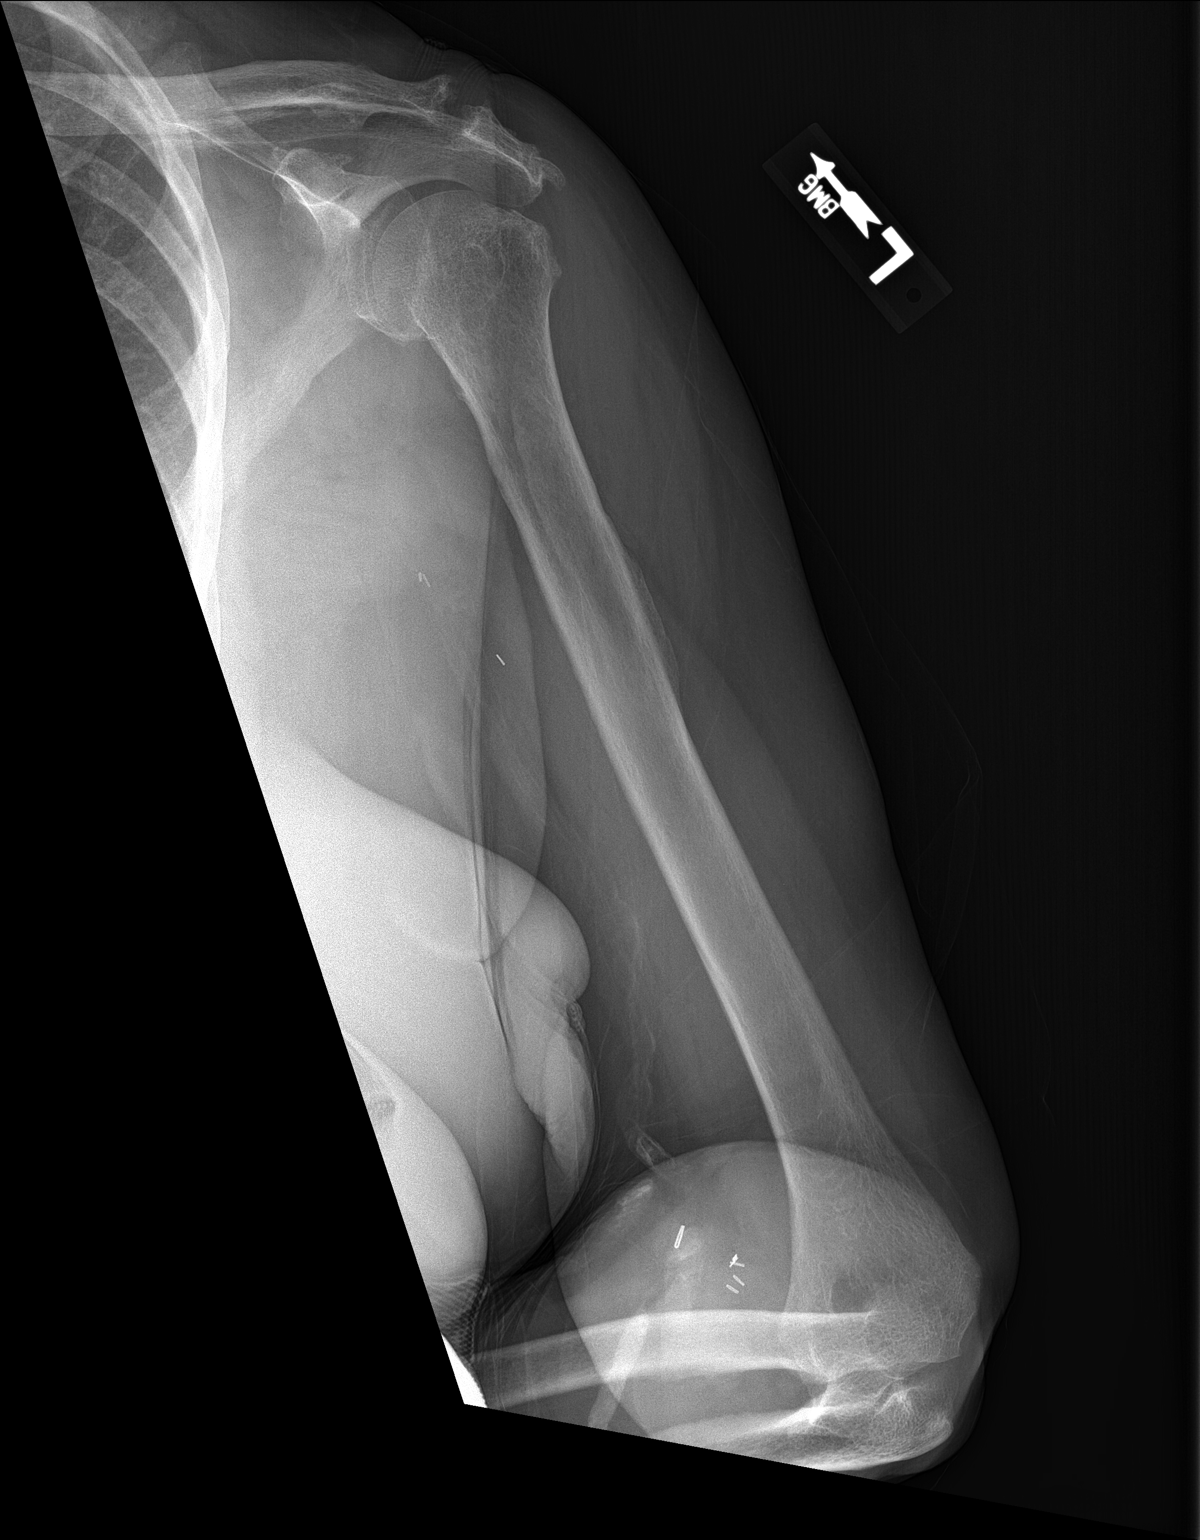

[2 of 2 positions shown; findings below may reference images not displayed]

FINDINGS: There is moderate degenerative joint disease the left shoulder with
loss of joint space and sclerosis with spurring. There is a downward
sloping acromion with acromial spurring suggesting impingement and
possibly chronic rotator cuff disease. The left humerus is intact.
Surgical clips overlie the left antecubital region.
IMPRESSION: 1. Moderate degenerative joint disease of the left shoulder.
2. No acute abnormality of the left humerus.

## 2017-02-25 DIAGNOSIS — N2581 Secondary hyperparathyroidism of renal origin: Secondary | ICD-10-CM | POA: Diagnosis not present

## 2017-02-25 DIAGNOSIS — N186 End stage renal disease: Secondary | ICD-10-CM | POA: Diagnosis not present

## 2017-02-27 DIAGNOSIS — N186 End stage renal disease: Secondary | ICD-10-CM | POA: Diagnosis not present

## 2017-02-27 DIAGNOSIS — N2581 Secondary hyperparathyroidism of renal origin: Secondary | ICD-10-CM | POA: Diagnosis not present

## 2017-03-02 DIAGNOSIS — N186 End stage renal disease: Secondary | ICD-10-CM | POA: Diagnosis not present

## 2017-03-02 DIAGNOSIS — N2581 Secondary hyperparathyroidism of renal origin: Secondary | ICD-10-CM | POA: Diagnosis not present

## 2017-03-04 DIAGNOSIS — N186 End stage renal disease: Secondary | ICD-10-CM | POA: Diagnosis not present

## 2017-03-04 DIAGNOSIS — N2581 Secondary hyperparathyroidism of renal origin: Secondary | ICD-10-CM | POA: Diagnosis not present

## 2017-03-06 DIAGNOSIS — N186 End stage renal disease: Secondary | ICD-10-CM | POA: Diagnosis not present

## 2017-03-06 DIAGNOSIS — N2581 Secondary hyperparathyroidism of renal origin: Secondary | ICD-10-CM | POA: Diagnosis not present

## 2017-03-09 DIAGNOSIS — N186 End stage renal disease: Secondary | ICD-10-CM | POA: Diagnosis not present

## 2017-03-09 DIAGNOSIS — N2581 Secondary hyperparathyroidism of renal origin: Secondary | ICD-10-CM | POA: Diagnosis not present

## 2017-03-13 DIAGNOSIS — N186 End stage renal disease: Secondary | ICD-10-CM | POA: Diagnosis not present

## 2017-03-13 DIAGNOSIS — N2581 Secondary hyperparathyroidism of renal origin: Secondary | ICD-10-CM | POA: Diagnosis not present

## 2017-03-16 DIAGNOSIS — N186 End stage renal disease: Secondary | ICD-10-CM | POA: Diagnosis not present

## 2017-03-16 DIAGNOSIS — N2581 Secondary hyperparathyroidism of renal origin: Secondary | ICD-10-CM | POA: Diagnosis not present

## 2017-03-18 DIAGNOSIS — N186 End stage renal disease: Secondary | ICD-10-CM | POA: Diagnosis not present

## 2017-03-18 DIAGNOSIS — N2581 Secondary hyperparathyroidism of renal origin: Secondary | ICD-10-CM | POA: Diagnosis not present

## 2017-03-20 DIAGNOSIS — N2581 Secondary hyperparathyroidism of renal origin: Secondary | ICD-10-CM | POA: Diagnosis not present

## 2017-03-20 DIAGNOSIS — Q612 Polycystic kidney, adult type: Secondary | ICD-10-CM | POA: Diagnosis not present

## 2017-03-20 DIAGNOSIS — Z992 Dependence on renal dialysis: Secondary | ICD-10-CM | POA: Diagnosis not present

## 2017-03-20 DIAGNOSIS — N186 End stage renal disease: Secondary | ICD-10-CM | POA: Diagnosis not present

## 2017-03-23 DIAGNOSIS — N2581 Secondary hyperparathyroidism of renal origin: Secondary | ICD-10-CM | POA: Diagnosis not present

## 2017-03-23 DIAGNOSIS — N186 End stage renal disease: Secondary | ICD-10-CM | POA: Diagnosis not present

## 2017-03-26 ENCOUNTER — Encounter: Payer: Self-pay | Admitting: Podiatry

## 2017-03-26 ENCOUNTER — Ambulatory Visit (INDEPENDENT_AMBULATORY_CARE_PROVIDER_SITE_OTHER): Payer: Medicare Other | Admitting: Podiatry

## 2017-03-26 DIAGNOSIS — B351 Tinea unguium: Secondary | ICD-10-CM

## 2017-03-26 DIAGNOSIS — M79676 Pain in unspecified toe(s): Secondary | ICD-10-CM | POA: Diagnosis not present

## 2017-03-26 NOTE — Progress Notes (Signed)
Complaint:  Visit Type: Patient returns to my office for continued preventative foot care services. Complaint: Patient states" my nails have grown long and thick and become painful to walk and wear shoes" . The patient presents for preventative foot care services. No changes to ROS  Podiatric Exam: Vascular: dorsalis pedis and posterior tibial pulses are palpable bilateral. Capillary return is immediate. Temperature gradient is WNL. Skin turgor WNL  Sensorium: Normal Semmes Weinstein monofilament test. Normal tactile sensation bilaterally. Nail Exam: Pt has thick disfigured discolored nails with subungual debris noted bilateral entire nail hallux through fifth toenails Ulcer Exam: There is no evidence of ulcer or pre-ulcerative changes or infection. Orthopedic Exam: Muscle tone and strength are WNL. No limitations in general ROM. No crepitus or effusions noted. Foot type and digits show no abnormalities. Bony prominences are unremarkable. Skin: No Porokeratosis. No infection or ulcers  Diagnosis:  Onychomycosis, , Pain in right toe, pain in left toes  Treatment & Plan Procedures and Treatment: Consent by patient was obtained for treatment procedures. The patient understood the discussion of treatment and procedures well. All questions were answered thoroughly reviewed. Debridement of mycotic and hypertrophic toenails, 1 through 5 bilateral and clearing of subungual debris. No ulceration, no infection noted.  Return Visit-Office Procedure: Patient instructed to return to the office for a follow up visit 3 months   for continued evaluation and treatment.    Pauleen Goleman DPM 

## 2017-03-27 DIAGNOSIS — N2581 Secondary hyperparathyroidism of renal origin: Secondary | ICD-10-CM | POA: Diagnosis not present

## 2017-03-27 DIAGNOSIS — N186 End stage renal disease: Secondary | ICD-10-CM | POA: Diagnosis not present

## 2017-03-30 DIAGNOSIS — N186 End stage renal disease: Secondary | ICD-10-CM | POA: Diagnosis not present

## 2017-03-30 DIAGNOSIS — N2581 Secondary hyperparathyroidism of renal origin: Secondary | ICD-10-CM | POA: Diagnosis not present

## 2017-04-01 DIAGNOSIS — N2581 Secondary hyperparathyroidism of renal origin: Secondary | ICD-10-CM | POA: Diagnosis not present

## 2017-04-01 DIAGNOSIS — N186 End stage renal disease: Secondary | ICD-10-CM | POA: Diagnosis not present

## 2017-04-03 DIAGNOSIS — N186 End stage renal disease: Secondary | ICD-10-CM | POA: Diagnosis not present

## 2017-04-03 DIAGNOSIS — N2581 Secondary hyperparathyroidism of renal origin: Secondary | ICD-10-CM | POA: Diagnosis not present

## 2017-04-06 DIAGNOSIS — N186 End stage renal disease: Secondary | ICD-10-CM | POA: Diagnosis not present

## 2017-04-06 DIAGNOSIS — N2581 Secondary hyperparathyroidism of renal origin: Secondary | ICD-10-CM | POA: Diagnosis not present

## 2017-04-08 DIAGNOSIS — N2581 Secondary hyperparathyroidism of renal origin: Secondary | ICD-10-CM | POA: Diagnosis not present

## 2017-04-08 DIAGNOSIS — N186 End stage renal disease: Secondary | ICD-10-CM | POA: Diagnosis not present

## 2017-04-10 DIAGNOSIS — N186 End stage renal disease: Secondary | ICD-10-CM | POA: Diagnosis not present

## 2017-04-10 DIAGNOSIS — N2581 Secondary hyperparathyroidism of renal origin: Secondary | ICD-10-CM | POA: Diagnosis not present

## 2017-04-13 DIAGNOSIS — N186 End stage renal disease: Secondary | ICD-10-CM | POA: Diagnosis not present

## 2017-04-13 DIAGNOSIS — N2581 Secondary hyperparathyroidism of renal origin: Secondary | ICD-10-CM | POA: Diagnosis not present

## 2017-04-15 DIAGNOSIS — N2581 Secondary hyperparathyroidism of renal origin: Secondary | ICD-10-CM | POA: Diagnosis not present

## 2017-04-15 DIAGNOSIS — N186 End stage renal disease: Secondary | ICD-10-CM | POA: Diagnosis not present

## 2017-04-17 DIAGNOSIS — N2581 Secondary hyperparathyroidism of renal origin: Secondary | ICD-10-CM | POA: Diagnosis not present

## 2017-04-17 DIAGNOSIS — N186 End stage renal disease: Secondary | ICD-10-CM | POA: Diagnosis not present

## 2017-04-20 DIAGNOSIS — N186 End stage renal disease: Secondary | ICD-10-CM | POA: Diagnosis not present

## 2017-04-20 DIAGNOSIS — Q612 Polycystic kidney, adult type: Secondary | ICD-10-CM | POA: Diagnosis not present

## 2017-04-20 DIAGNOSIS — Z992 Dependence on renal dialysis: Secondary | ICD-10-CM | POA: Diagnosis not present

## 2017-04-20 DIAGNOSIS — N2581 Secondary hyperparathyroidism of renal origin: Secondary | ICD-10-CM | POA: Diagnosis not present

## 2017-04-22 DIAGNOSIS — N186 End stage renal disease: Secondary | ICD-10-CM | POA: Diagnosis not present

## 2017-04-22 DIAGNOSIS — N2581 Secondary hyperparathyroidism of renal origin: Secondary | ICD-10-CM | POA: Diagnosis not present

## 2017-04-24 DIAGNOSIS — N186 End stage renal disease: Secondary | ICD-10-CM | POA: Diagnosis not present

## 2017-04-24 DIAGNOSIS — N2581 Secondary hyperparathyroidism of renal origin: Secondary | ICD-10-CM | POA: Diagnosis not present

## 2017-04-27 DIAGNOSIS — N2581 Secondary hyperparathyroidism of renal origin: Secondary | ICD-10-CM | POA: Diagnosis not present

## 2017-04-27 DIAGNOSIS — N186 End stage renal disease: Secondary | ICD-10-CM | POA: Diagnosis not present

## 2017-04-29 DIAGNOSIS — N2581 Secondary hyperparathyroidism of renal origin: Secondary | ICD-10-CM | POA: Diagnosis not present

## 2017-04-29 DIAGNOSIS — N186 End stage renal disease: Secondary | ICD-10-CM | POA: Diagnosis not present

## 2017-05-01 DIAGNOSIS — N2581 Secondary hyperparathyroidism of renal origin: Secondary | ICD-10-CM | POA: Diagnosis not present

## 2017-05-01 DIAGNOSIS — N186 End stage renal disease: Secondary | ICD-10-CM | POA: Diagnosis not present

## 2017-05-04 DIAGNOSIS — N186 End stage renal disease: Secondary | ICD-10-CM | POA: Diagnosis not present

## 2017-05-04 DIAGNOSIS — N2581 Secondary hyperparathyroidism of renal origin: Secondary | ICD-10-CM | POA: Diagnosis not present

## 2017-05-06 DIAGNOSIS — N186 End stage renal disease: Secondary | ICD-10-CM | POA: Diagnosis not present

## 2017-05-06 DIAGNOSIS — N2581 Secondary hyperparathyroidism of renal origin: Secondary | ICD-10-CM | POA: Diagnosis not present

## 2017-05-08 DIAGNOSIS — N186 End stage renal disease: Secondary | ICD-10-CM | POA: Diagnosis not present

## 2017-05-08 DIAGNOSIS — N2581 Secondary hyperparathyroidism of renal origin: Secondary | ICD-10-CM | POA: Diagnosis not present

## 2017-05-11 DIAGNOSIS — N2581 Secondary hyperparathyroidism of renal origin: Secondary | ICD-10-CM | POA: Diagnosis not present

## 2017-05-11 DIAGNOSIS — N186 End stage renal disease: Secondary | ICD-10-CM | POA: Diagnosis not present

## 2017-05-13 DIAGNOSIS — N186 End stage renal disease: Secondary | ICD-10-CM | POA: Diagnosis not present

## 2017-05-13 DIAGNOSIS — N2581 Secondary hyperparathyroidism of renal origin: Secondary | ICD-10-CM | POA: Diagnosis not present

## 2017-05-15 DIAGNOSIS — N2581 Secondary hyperparathyroidism of renal origin: Secondary | ICD-10-CM | POA: Diagnosis not present

## 2017-05-15 DIAGNOSIS — N186 End stage renal disease: Secondary | ICD-10-CM | POA: Diagnosis not present

## 2017-05-18 DIAGNOSIS — N186 End stage renal disease: Secondary | ICD-10-CM | POA: Diagnosis not present

## 2017-05-18 DIAGNOSIS — N2581 Secondary hyperparathyroidism of renal origin: Secondary | ICD-10-CM | POA: Diagnosis not present

## 2017-05-20 DIAGNOSIS — N2581 Secondary hyperparathyroidism of renal origin: Secondary | ICD-10-CM | POA: Diagnosis not present

## 2017-05-20 DIAGNOSIS — N186 End stage renal disease: Secondary | ICD-10-CM | POA: Diagnosis not present

## 2017-05-21 DIAGNOSIS — Q612 Polycystic kidney, adult type: Secondary | ICD-10-CM | POA: Diagnosis not present

## 2017-05-21 DIAGNOSIS — N186 End stage renal disease: Secondary | ICD-10-CM | POA: Diagnosis not present

## 2017-05-21 DIAGNOSIS — Z992 Dependence on renal dialysis: Secondary | ICD-10-CM | POA: Diagnosis not present

## 2017-05-22 DIAGNOSIS — N2581 Secondary hyperparathyroidism of renal origin: Secondary | ICD-10-CM | POA: Diagnosis not present

## 2017-05-22 DIAGNOSIS — N186 End stage renal disease: Secondary | ICD-10-CM | POA: Diagnosis not present

## 2017-05-25 DIAGNOSIS — N2581 Secondary hyperparathyroidism of renal origin: Secondary | ICD-10-CM | POA: Diagnosis not present

## 2017-05-25 DIAGNOSIS — N186 End stage renal disease: Secondary | ICD-10-CM | POA: Diagnosis not present

## 2017-05-27 DIAGNOSIS — N186 End stage renal disease: Secondary | ICD-10-CM | POA: Diagnosis not present

## 2017-05-27 DIAGNOSIS — N2581 Secondary hyperparathyroidism of renal origin: Secondary | ICD-10-CM | POA: Diagnosis not present

## 2017-05-29 DIAGNOSIS — N2581 Secondary hyperparathyroidism of renal origin: Secondary | ICD-10-CM | POA: Diagnosis not present

## 2017-05-29 DIAGNOSIS — N186 End stage renal disease: Secondary | ICD-10-CM | POA: Diagnosis not present

## 2017-06-01 DIAGNOSIS — N186 End stage renal disease: Secondary | ICD-10-CM | POA: Diagnosis not present

## 2017-06-01 DIAGNOSIS — N2581 Secondary hyperparathyroidism of renal origin: Secondary | ICD-10-CM | POA: Diagnosis not present

## 2017-06-03 DIAGNOSIS — N186 End stage renal disease: Secondary | ICD-10-CM | POA: Diagnosis not present

## 2017-06-03 DIAGNOSIS — N2581 Secondary hyperparathyroidism of renal origin: Secondary | ICD-10-CM | POA: Diagnosis not present

## 2017-06-04 DIAGNOSIS — M65332 Trigger finger, left middle finger: Secondary | ICD-10-CM | POA: Diagnosis not present

## 2017-06-04 DIAGNOSIS — M2560 Stiffness of unspecified joint, not elsewhere classified: Secondary | ICD-10-CM | POA: Insufficient documentation

## 2017-06-05 DIAGNOSIS — N186 End stage renal disease: Secondary | ICD-10-CM | POA: Diagnosis not present

## 2017-06-05 DIAGNOSIS — N2581 Secondary hyperparathyroidism of renal origin: Secondary | ICD-10-CM | POA: Diagnosis not present

## 2017-06-08 DIAGNOSIS — N2581 Secondary hyperparathyroidism of renal origin: Secondary | ICD-10-CM | POA: Diagnosis not present

## 2017-06-08 DIAGNOSIS — N186 End stage renal disease: Secondary | ICD-10-CM | POA: Diagnosis not present

## 2017-06-09 DIAGNOSIS — M25649 Stiffness of unspecified hand, not elsewhere classified: Secondary | ICD-10-CM | POA: Diagnosis not present

## 2017-06-09 DIAGNOSIS — M2560 Stiffness of unspecified joint, not elsewhere classified: Secondary | ICD-10-CM | POA: Diagnosis not present

## 2017-06-10 DIAGNOSIS — N186 End stage renal disease: Secondary | ICD-10-CM | POA: Diagnosis not present

## 2017-06-10 DIAGNOSIS — N2581 Secondary hyperparathyroidism of renal origin: Secondary | ICD-10-CM | POA: Diagnosis not present

## 2017-06-12 DIAGNOSIS — N186 End stage renal disease: Secondary | ICD-10-CM | POA: Diagnosis not present

## 2017-06-12 DIAGNOSIS — N2581 Secondary hyperparathyroidism of renal origin: Secondary | ICD-10-CM | POA: Diagnosis not present

## 2017-06-15 DIAGNOSIS — N2581 Secondary hyperparathyroidism of renal origin: Secondary | ICD-10-CM | POA: Diagnosis not present

## 2017-06-15 DIAGNOSIS — N186 End stage renal disease: Secondary | ICD-10-CM | POA: Diagnosis not present

## 2017-06-17 ENCOUNTER — Encounter (HOSPITAL_COMMUNITY): Payer: Self-pay | Admitting: Emergency Medicine

## 2017-06-17 ENCOUNTER — Encounter: Payer: Self-pay | Admitting: Vascular Surgery

## 2017-06-17 ENCOUNTER — Ambulatory Visit (INDEPENDENT_AMBULATORY_CARE_PROVIDER_SITE_OTHER): Payer: Medicare Other | Admitting: Vascular Surgery

## 2017-06-17 ENCOUNTER — Ambulatory Visit (HOSPITAL_COMMUNITY)
Admission: EM | Admit: 2017-06-17 | Discharge: 2017-06-17 | Disposition: A | Discharge status: Home / Self Care | Payer: Medicare Other | Attending: Family Medicine | Admitting: Family Medicine

## 2017-06-17 VITALS — BP 104/60 | HR 77 | Temp 97.4°F | Resp 18 | Ht 62.0 in | Wt 194.7 lb

## 2017-06-17 DIAGNOSIS — Z992 Dependence on renal dialysis: Secondary | ICD-10-CM | POA: Diagnosis not present

## 2017-06-17 DIAGNOSIS — R1032 Left lower quadrant pain: Secondary | ICD-10-CM

## 2017-06-17 DIAGNOSIS — R1030 Lower abdominal pain, unspecified: Secondary | ICD-10-CM

## 2017-06-17 DIAGNOSIS — M79652 Pain in left thigh: Secondary | ICD-10-CM | POA: Diagnosis not present

## 2017-06-17 DIAGNOSIS — N186 End stage renal disease: Secondary | ICD-10-CM

## 2017-06-17 NOTE — ED Triage Notes (Signed)
Provider at bedside. Left thigh pain, left thigh is location of current graft.

## 2017-06-17 NOTE — Progress Notes (Signed)
Patient is a 71 year old female who presented as an out on to the office today complaining of pain in her left thigh graft. The thigh graft has been present for 16 years. 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 At that time it was noted that the graft was severely calcified and not felt to be a candidate for further revision. The patient began to have pain in her left inguinal region over the weekend. She stated that the pain was sharp in the left groin. She did take some Tylenol and also placed an ice pack on this. She currently states she is not having pain but occasionally has a "twinge" in the left groin. She was requesting an x-ray of the graft today to see whether or not there was a nerve that was being compressed. She was also requesting hydrocodone in case the pain returned. She denies any fever or chills. She has no redness around the graft. She has had no drainage.  Physcal exam:  Vitals:   06/17/17 1221  BP: 104/60  Pulse: 77  Resp: 18  Temp: (!) 97.4 F (36.3 C)  TempSrc: Oral  SpO2: 94%  Weight: 194 lb 11.2 oz (88.3 kg)  Height: 5\' 2"  (1.575 m)    Left lower extremity: 2+ dorsalis pedis pulse audible bruit in left thigh graft well-healed left groin incision no erythema no drainage no skin changes no pain on palpation of the graft no mass no aneurysmal degeneration  Assessment: Pain left thigh AV graft now resolved. I did not find any evidence of graft thrombosis or infection.  Plan: The patient was requesting narcotic pain medication in the event that the pain returns. I discussed with her that I did not believe she needed narcotic pain medication for this. I encouraged her to continue to take Tylenol for the pain if this returns. I discussed with her the dosing of the Tylenol that she can take 1-2 tablets every 4-6 hours as needed for pain. I also discussed with her that if her pain returns she can talk with her nephrologist about this.  She currently states that she is not having difficulties using the graft on dialysis. I also discussed with her that there was no x-rays it was going to show whether or not the graft was compressing a nerve. The graft has been in place for almost 16 years and for it to suddenly spontaneously irritate a nerve would be unusual.  She will follow-up on as-needed basis.  Ruta Hinds, MD [facility] Office: [phone] Pager: [phone]

## 2017-06-17 NOTE — Discharge Instructions (Signed)
To go to Dr. Mee Hives office at this time. The nurse states that they can see you today. Do not delay go now.

## 2017-06-17 NOTE — ED Provider Notes (Addendum)
[place]    CSN: [id number] Arrival date & time: 06/17/17  1004     History   Chief Complaint Chief Complaint  Patient presents with  . Leg Pain    HPI CHEYANE AYON is a 71 y.o. female.   71 year old female with end-stage renal disease is complaining of left inguinal pain for the past 5 days. It is intermittent and described as sharp. Sometimes worse when sitting and lying. Last night the pain was severe described as a level X. She initially went to the vascular center to see the physician to placed the graft initially and was asked to fill out some papers however she left early to come to the urgent care. According to the nurse I spoke with at the vascular center she had planned to call her and have her come in today. Now she is stating that her pain is a level 0. She is requesting evaluation and/or a pain pill.      Past Medical History:  Diagnosis Date  . Anemia   . Arthritis   . Back pain, chronic   . Blood transfusion without reported diagnosis   . Cataract   . Complication of anesthesia    " I woke up during the procedure."  . ESRD (end stage renal disease) on dialysis ([place])    "TTS; [place]" (02/07/2016)  . Hyperparathyroidism, secondary ([place])   . Hypertension    off meds now  . 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 Uterine fibroid     Patient Active Problem List   Diagnosis Date Noted  . Rotator cuff injury, left, initial encounter 07/17/2016  . Benign adenomatous polyp of large intestine 05/14/2016  . Diverticulosis of colon without hemorrhage 05/14/2016  . History of colonic polyps 05/14/2016  . Special screening for malignant neoplasms, colon   . ESRD (end stage renal disease) ([place]) 02/07/2016  . Pain in limb 10/31/2013  . Numbness of fingers 07/19/2012  . Aftercare following surgery of the circulatory system, [place] 06/21/2012  . Other complications due  to other vascular device, implant, and graft 06/21/2012  . End stage renal disease ([place]) 02/04/2012    Past Surgical History:  Procedure Laterality Date  . ARTERIOVENOUS GRAFT PLACEMENT Right 03/19/2000   upper arm  . ARTERIOVENOUS GRAFT PLACEMENT Left 12/03/2000   thigh  . CARPAL TUNNEL RELEASE Left 03/10/2013   Procedure: CARPAL TUNNEL RELEASE;  Surgeon: Wynonia Sours, MD;  Location: [facility];  Service: Orthopedics;  Laterality: Left;  . CARPAL TUNNEL RELEASE Right 03/28/2015   Procedure: RIGHT CARPAL TUNNEL RELEASE;  Surgeon: Daryll Brod, MD;  Location: [facility];  Service: Orthopedics;  Laterality: Right;  ANESTHESIA:  IV REGIONAL FAB  . COLONOSCOPY W/ BIOPSIES    . COLONOSCOPY WITH PROPOFOL N/A 05/14/2016   Procedure: COLONOSCOPY WITH PROPOFOL;  Surgeon: Milus Banister, MD;  Location: [facility];  Service: Endoscopy;  Laterality: N/A;  . HEMICOLECTOMY  02/1996  . HERNIA REPAIR     laparoscopic repair during a  hospitalization from 03/26/2006-03/30/2006  . INCISIONAL HERNIA REPAIR  04/15/2006   laparoscopic  . INSERTION OF DIALYSIS CATHETER Left 06/06/2000   IJ Quinton catheter  . INSERTION OF DIALYSIS CATHETER Right 06/28/2000   IJ Ash catheter  . INSERTION OF DIALYSIS CATHETER Left 08/13/2000   subclavian Ash catheter  . multiple failed grafts  left thigh AVG 12/03/00, clotted -05/31/03, 01/24/04, 08/28/04, 09/06/04( thrombectomy and revision )Left AVG declot procedure including complete AV shuntogram, 08/29/04 left AV thrombolysis and angioplasty 2012 shunto gram to left thigh AVG  . REMOVAL OF A DIALYSIS CATHETER  05/31/2000   Schon catheter  . REVISION OF ARTERIOVENOUS GORETEX GRAFT Left 06/23/2012   thigh; with exc. pseudoaneurysm  . REVISION OF ARTERIOVENOUS GORETEX GRAFT Left 02/07/2016   Procedure: REVISION OF LEFT THIGH ARTERIOVENOUS GORETEX GRAFT;  Surgeon: Angelia Mould, MD;  Location: [facility];  Service: Vascular;  Laterality: Left;    . SHUNTOGRAM Left 02/16/2012   thigh; with angioplasty, venous anastomosis; stent, medial graft pseudoaneurysm  . SHUNTOGRAM N/A 02/16/2012   Procedure: Earney Mallet;  Surgeon: Serafina Mitchell, MD;  Location: [facility] CATH LAB;  Service: Cardiovascular;  Laterality: N/A;  . THROMBECTOMY / ARTERIOVENOUS GRAFT REVISION Left 02/07/2016   thigh  . THROMBECTOMY AND REVISION OF ARTERIOVENTOUS (AV) GORETEX  GRAFT Right 06/04/2000   upper arm  . THROMBECTOMY AND REVISION OF ARTERIOVENTOUS (AV) GORETEX  GRAFT Left 09/06/2004   thigh  . TRIGGER FINGER RELEASE Right 03/28/2015   Procedure: RELEASE A-1 PULLEY RIGHT THUMB;  Surgeon: Daryll Brod, MD;  Location: [facility];  Service: Orthopedics;  Laterality: Right;    OB History    No data available       Home Medications    Prior to Admission medications   Medication Sig Start Date End Date Taking? Authorizing Provider  acetaminophen (TYLENOL) 500 MG tablet Take 500 mg by mouth 2 (two) times daily as needed for moderate pain (for back).    [provider]  cinacalcet (SENSIPAR) 90 MG tablet Take 90 mg by mouth daily after supper. TAKES 2 PILLS (TOTALING 180 MG) DAILY AFTER SUPPER    [provider]  ketotifen (ZADITOR) 0.025 % ophthalmic solution Place 1 drop into both eyes daily as needed (for dry eyes).    [provider]  multivitamin (RENA-VIT) TABS tablet Take 1 tablet by mouth daily.    [provider]  NONFORMULARY OR COMPOUNDED ITEM [employer] compound: Nail Fungus - Complex Nail and Foot Disorders - Fluconazole 1%, DMSO 25%, Fluocinonide 0.05%, Ketoconazole 2%, apply 1-2 pumps to each affected nail beds and feet twice daily. 02/14/16   Trula Slade, DPM  Nutritional Supplements (FEEDING SUPPLEMENT, NEPRO CARB STEADY,) LIQD Take 237 mLs by mouth daily.    [provider]  sevelamer (RENVELA) 800 MG tablet Take 800-4,000 mg by mouth 5 (five) times daily. [phone] mg with snacks and 4000 mg  with meals    [provider]    Family History Family History  Problem Relation Age of Onset  . Cancer Mother        COLON  . Colon cancer Mother 80    Social History Social History  Substance Use Topics  . Smoking status: Former Smoker    Packs/day: 0.10    Types: Cigarettes    Quit date: 02/03/1978  . Smokeless tobacco: Never Used  . Alcohol use 0.0 oz/week     Comment: rare     Allergies   Patient has no known allergies.   Review of Systems Review of Systems  Constitutional: Negative.   HENT: Negative.   Respiratory: Negative.   Cardiovascular: Negative.   Gastrointestinal: Negative.   Neurological: Negative.   All other systems reviewed and are negative.    Physical Exam Triage Vital Signs ED Triage Vitals  Enc Vitals Group  BP      Pulse      Resp      Temp      Temp src      SpO2      Weight      Height      Head Circumference      Peak Flow      Pain Score      Pain Loc      Pain Edu?      Excl. in [place]?    No data found.   Updated Vital Signs BP (!) 128/44 (BP Location: Right Arm) Comment: reported BP to nurse Kim Lapan-Hutchens  Pulse 81   Temp 97.7 F (36.5 C) (Oral)   Resp 16   LMP 02/16/2012   SpO2 95%   Visual Acuity Right Eye Distance:   Left Eye Distance:   Bilateral Distance:    Right Eye Near:   Left Eye Near:    Bilateral Near:     Physical Exam  Constitutional: She is oriented to person, place, and time. She appears well-developed and well-nourished. No distress.  Eyes: EOM are normal.  Neck: Neck supple.  Cardiovascular: Normal rate.   Pulmonary/Chest: Effort normal.  Musculoskeletal: Normal range of motion. She exhibits no edema.  Neurological: She is alert and oriented to person, place, and time.  Skin: Skin is warm and dry.  Evaluation of the site of pain which is at the left inguinal crease demonstrates no masses, evidence of hematoma or abnormal discoloration. No area tenderness. Just distal to  the inguinal crease is a palpable raised area where the AVG had been placed. There is a strong pulse there and medial to this point there is another area/end of the AVG which has a palpable thrill. No swelling, no hardening of tissue suggesting a hematoma. No increase in warmth, no redness. No sign of infection. Currently she is having no tenderness or pain.  Nursing note and vitals reviewed.    UC Treatments / Results  Labs (all labs ordered are listed, but only abnormal results are displayed) Labs Reviewed - No data to display  EKG  EKG Interpretation None       Radiology No results found.  Procedures Procedures (including critical care time)  Medications Ordered in UC Medications - No data to display   Initial Impression / Assessment and Plan / UC Course  I have reviewed the triage vital signs and the nursing notes.  Pertinent labs & imaging results that were available during my care of the patient were reviewed by me and considered in my medical decision making (see chart for details). Spoke with nephrologist Dr. Lorrene Reid, whose office referred me to renal dialysis, who referred me to bring in vascular surgery Dr. Mee Hives office. Spoke with nurse representative there who was made with the patient and asked her to come to the office at this time and she was going to consult with Dr. fields who is present in the office. Patient is in stable condition, pain-free and is to go there now.   To go to Dr. Mee Hives office at this time. The nurse states that they can see you today. Do not delay go now.    Final Clinical Impressions(s) / UC Diagnoses   Final diagnoses:  Inguinal pain, left  End stage renal disease on dialysis [facility])    New Prescriptions Current Discharge Medication List       Controlled Substance Prescriptions [place] Controlled Substance Registry consulted? Not Applicable  Janne Napoleon, NPDischarged at this time the provider 06/17/17 1106    Janne Napoleon,  NP 06/17/17 1111    Janne Napoleon, NP 06/17/17 1136

## 2017-06-18 DIAGNOSIS — M25649 Stiffness of unspecified hand, not elsewhere classified: Secondary | ICD-10-CM | POA: Diagnosis not present

## 2017-06-18 DIAGNOSIS — M2560 Stiffness of unspecified joint, not elsewhere classified: Secondary | ICD-10-CM | POA: Diagnosis not present

## 2017-06-19 DIAGNOSIS — N186 End stage renal disease: Secondary | ICD-10-CM | POA: Diagnosis not present

## 2017-06-19 DIAGNOSIS — N2581 Secondary hyperparathyroidism of renal origin: Secondary | ICD-10-CM | POA: Diagnosis not present

## 2017-06-20 DIAGNOSIS — Q612 Polycystic kidney, adult type: Secondary | ICD-10-CM | POA: Diagnosis not present

## 2017-06-20 DIAGNOSIS — Z992 Dependence on renal dialysis: Secondary | ICD-10-CM | POA: Diagnosis not present

## 2017-06-20 DIAGNOSIS — N186 End stage renal disease: Secondary | ICD-10-CM | POA: Diagnosis not present

## 2017-06-22 DIAGNOSIS — N186 End stage renal disease: Secondary | ICD-10-CM | POA: Diagnosis not present

## 2017-06-22 DIAGNOSIS — N2581 Secondary hyperparathyroidism of renal origin: Secondary | ICD-10-CM | POA: Diagnosis not present

## 2017-06-24 DIAGNOSIS — N2581 Secondary hyperparathyroidism of renal origin: Secondary | ICD-10-CM | POA: Diagnosis not present

## 2017-06-24 DIAGNOSIS — N186 End stage renal disease: Secondary | ICD-10-CM | POA: Diagnosis not present

## 2017-06-25 DIAGNOSIS — M2560 Stiffness of unspecified joint, not elsewhere classified: Secondary | ICD-10-CM | POA: Diagnosis not present

## 2017-06-25 DIAGNOSIS — M25642 Stiffness of left hand, not elsewhere classified: Secondary | ICD-10-CM | POA: Diagnosis not present

## 2017-06-26 DIAGNOSIS — N186 End stage renal disease: Secondary | ICD-10-CM | POA: Diagnosis not present

## 2017-06-26 DIAGNOSIS — N2581 Secondary hyperparathyroidism of renal origin: Secondary | ICD-10-CM | POA: Diagnosis not present

## 2017-06-29 DIAGNOSIS — N186 End stage renal disease: Secondary | ICD-10-CM | POA: Diagnosis not present

## 2017-06-29 DIAGNOSIS — N2581 Secondary hyperparathyroidism of renal origin: Secondary | ICD-10-CM | POA: Diagnosis not present

## 2017-06-30 ENCOUNTER — Encounter: Payer: Self-pay | Admitting: Podiatry

## 2017-06-30 ENCOUNTER — Ambulatory Visit (INDEPENDENT_AMBULATORY_CARE_PROVIDER_SITE_OTHER): Payer: Medicare Other | Admitting: Podiatry

## 2017-06-30 DIAGNOSIS — M79674 Pain in right toe(s): Secondary | ICD-10-CM

## 2017-06-30 DIAGNOSIS — M79676 Pain in unspecified toe(s): Secondary | ICD-10-CM

## 2017-06-30 DIAGNOSIS — B351 Tinea unguium: Secondary | ICD-10-CM

## 2017-06-30 DIAGNOSIS — M79675 Pain in left toe(s): Secondary | ICD-10-CM

## 2017-06-30 NOTE — Progress Notes (Signed)
Complaint:  Visit Type: Patient returns to my office for continued preventative foot care services. Complaint: Patient states" my nails have grown long and thick and become painful to walk and wear shoes" . The patient presents for preventative foot care services. No changes to ROS  Podiatric Exam: Vascular: dorsalis pedis and posterior tibial pulses are palpable bilateral. Capillary return is immediate. Temperature gradient is WNL. Skin turgor WNL  Sensorium: Normal Semmes Weinstein monofilament test. Normal tactile sensation bilaterally. Nail Exam: Pt has thick disfigured discolored nails with subungual debris noted bilateral entire nail hallux through fifth toenails Ulcer Exam: There is no evidence of ulcer or pre-ulcerative changes or infection. Orthopedic Exam: Muscle tone and strength are WNL. No limitations in general ROM. No crepitus or effusions noted. Foot type and digits show no abnormalities. Bony prominences are unremarkable. Skin: No Porokeratosis. No infection or ulcers  Diagnosis:  Onychomycosis, , Pain in right toe, pain in left toes  Treatment & Plan Procedures and Treatment: Consent by patient was obtained for treatment procedures. The patient understood the discussion of treatment and procedures well. All questions were answered thoroughly reviewed. Debridement of mycotic and hypertrophic toenails, 1 through 5 bilateral and clearing of subungual debris. No ulceration, no infection noted.  Return Visit-Office Procedure: Patient instructed to return to the office for a follow up visit 3 months   for continued evaluation and treatment.    Cj Edgell DPM 

## 2017-07-03 DIAGNOSIS — N186 End stage renal disease: Secondary | ICD-10-CM | POA: Diagnosis not present

## 2017-07-03 DIAGNOSIS — N2581 Secondary hyperparathyroidism of renal origin: Secondary | ICD-10-CM | POA: Diagnosis not present

## 2017-07-06 DIAGNOSIS — N186 End stage renal disease: Secondary | ICD-10-CM | POA: Diagnosis not present

## 2017-07-06 DIAGNOSIS — N2581 Secondary hyperparathyroidism of renal origin: Secondary | ICD-10-CM | POA: Diagnosis not present

## 2017-07-08 DIAGNOSIS — N2581 Secondary hyperparathyroidism of renal origin: Secondary | ICD-10-CM | POA: Diagnosis not present

## 2017-07-08 DIAGNOSIS — N186 End stage renal disease: Secondary | ICD-10-CM | POA: Diagnosis not present

## 2017-07-10 DIAGNOSIS — N2581 Secondary hyperparathyroidism of renal origin: Secondary | ICD-10-CM | POA: Diagnosis not present

## 2017-07-10 DIAGNOSIS — N186 End stage renal disease: Secondary | ICD-10-CM | POA: Diagnosis not present

## 2017-07-13 DIAGNOSIS — N186 End stage renal disease: Secondary | ICD-10-CM | POA: Diagnosis not present

## 2017-07-13 DIAGNOSIS — N2581 Secondary hyperparathyroidism of renal origin: Secondary | ICD-10-CM | POA: Diagnosis not present

## 2017-07-17 DIAGNOSIS — N2581 Secondary hyperparathyroidism of renal origin: Secondary | ICD-10-CM | POA: Diagnosis not present

## 2017-07-17 DIAGNOSIS — N186 End stage renal disease: Secondary | ICD-10-CM | POA: Diagnosis not present

## 2017-07-20 DIAGNOSIS — N2581 Secondary hyperparathyroidism of renal origin: Secondary | ICD-10-CM | POA: Diagnosis not present

## 2017-07-20 DIAGNOSIS — N186 End stage renal disease: Secondary | ICD-10-CM | POA: Diagnosis not present

## 2017-07-21 DIAGNOSIS — Z992 Dependence on renal dialysis: Secondary | ICD-10-CM | POA: Diagnosis not present

## 2017-07-21 DIAGNOSIS — N186 End stage renal disease: Secondary | ICD-10-CM | POA: Diagnosis not present

## 2017-07-21 DIAGNOSIS — Q612 Polycystic kidney, adult type: Secondary | ICD-10-CM | POA: Diagnosis not present

## 2017-07-22 DIAGNOSIS — N186 End stage renal disease: Secondary | ICD-10-CM | POA: Diagnosis not present

## 2017-07-22 DIAGNOSIS — N2581 Secondary hyperparathyroidism of renal origin: Secondary | ICD-10-CM | POA: Diagnosis not present

## 2017-07-24 DIAGNOSIS — N2581 Secondary hyperparathyroidism of renal origin: Secondary | ICD-10-CM | POA: Diagnosis not present

## 2017-07-24 DIAGNOSIS — N186 End stage renal disease: Secondary | ICD-10-CM | POA: Diagnosis not present

## 2017-07-27 DIAGNOSIS — N2581 Secondary hyperparathyroidism of renal origin: Secondary | ICD-10-CM | POA: Diagnosis not present

## 2017-07-27 DIAGNOSIS — N186 End stage renal disease: Secondary | ICD-10-CM | POA: Diagnosis not present

## 2017-07-29 DIAGNOSIS — N2581 Secondary hyperparathyroidism of renal origin: Secondary | ICD-10-CM | POA: Diagnosis not present

## 2017-07-29 DIAGNOSIS — N186 End stage renal disease: Secondary | ICD-10-CM | POA: Diagnosis not present

## 2017-07-31 DIAGNOSIS — N2581 Secondary hyperparathyroidism of renal origin: Secondary | ICD-10-CM | POA: Diagnosis not present

## 2017-07-31 DIAGNOSIS — N186 End stage renal disease: Secondary | ICD-10-CM | POA: Diagnosis not present

## 2017-08-02 DIAGNOSIS — N186 End stage renal disease: Secondary | ICD-10-CM | POA: Diagnosis not present

## 2017-08-02 DIAGNOSIS — T82858A Stenosis of vascular prosthetic devices, implants and grafts, initial encounter: Secondary | ICD-10-CM | POA: Diagnosis not present

## 2017-08-02 DIAGNOSIS — Z992 Dependence on renal dialysis: Secondary | ICD-10-CM | POA: Diagnosis not present

## 2017-08-03 DIAGNOSIS — N2581 Secondary hyperparathyroidism of renal origin: Secondary | ICD-10-CM | POA: Diagnosis not present

## 2017-08-03 DIAGNOSIS — N186 End stage renal disease: Secondary | ICD-10-CM | POA: Diagnosis not present

## 2017-08-05 DIAGNOSIS — N186 End stage renal disease: Secondary | ICD-10-CM | POA: Diagnosis not present

## 2017-08-05 DIAGNOSIS — N2581 Secondary hyperparathyroidism of renal origin: Secondary | ICD-10-CM | POA: Diagnosis not present

## 2017-08-07 DIAGNOSIS — N2581 Secondary hyperparathyroidism of renal origin: Secondary | ICD-10-CM | POA: Diagnosis not present

## 2017-08-07 DIAGNOSIS — N186 End stage renal disease: Secondary | ICD-10-CM | POA: Diagnosis not present

## 2017-08-09 DIAGNOSIS — N2581 Secondary hyperparathyroidism of renal origin: Secondary | ICD-10-CM | POA: Diagnosis not present

## 2017-08-09 DIAGNOSIS — N186 End stage renal disease: Secondary | ICD-10-CM | POA: Diagnosis not present

## 2017-08-11 DIAGNOSIS — N2581 Secondary hyperparathyroidism of renal origin: Secondary | ICD-10-CM | POA: Diagnosis not present

## 2017-08-11 DIAGNOSIS — N186 End stage renal disease: Secondary | ICD-10-CM | POA: Diagnosis not present

## 2017-08-14 DIAGNOSIS — N186 End stage renal disease: Secondary | ICD-10-CM | POA: Diagnosis not present

## 2017-08-14 DIAGNOSIS — N2581 Secondary hyperparathyroidism of renal origin: Secondary | ICD-10-CM | POA: Diagnosis not present

## 2017-08-17 DIAGNOSIS — N186 End stage renal disease: Secondary | ICD-10-CM | POA: Diagnosis not present

## 2017-08-17 DIAGNOSIS — N2581 Secondary hyperparathyroidism of renal origin: Secondary | ICD-10-CM | POA: Diagnosis not present

## 2017-08-19 DIAGNOSIS — N186 End stage renal disease: Secondary | ICD-10-CM | POA: Diagnosis not present

## 2017-08-19 DIAGNOSIS — N2581 Secondary hyperparathyroidism of renal origin: Secondary | ICD-10-CM | POA: Diagnosis not present

## 2017-08-20 DIAGNOSIS — Q612 Polycystic kidney, adult type: Secondary | ICD-10-CM | POA: Diagnosis not present

## 2017-08-20 DIAGNOSIS — N186 End stage renal disease: Secondary | ICD-10-CM | POA: Diagnosis not present

## 2017-08-20 DIAGNOSIS — Z992 Dependence on renal dialysis: Secondary | ICD-10-CM | POA: Diagnosis not present

## 2017-08-21 DIAGNOSIS — N2581 Secondary hyperparathyroidism of renal origin: Secondary | ICD-10-CM | POA: Diagnosis not present

## 2017-08-21 DIAGNOSIS — N186 End stage renal disease: Secondary | ICD-10-CM | POA: Diagnosis not present

## 2017-08-24 DIAGNOSIS — N2581 Secondary hyperparathyroidism of renal origin: Secondary | ICD-10-CM | POA: Diagnosis not present

## 2017-08-24 DIAGNOSIS — N186 End stage renal disease: Secondary | ICD-10-CM | POA: Diagnosis not present

## 2017-08-26 DIAGNOSIS — N186 End stage renal disease: Secondary | ICD-10-CM | POA: Diagnosis not present

## 2017-08-26 DIAGNOSIS — N2581 Secondary hyperparathyroidism of renal origin: Secondary | ICD-10-CM | POA: Diagnosis not present

## 2017-08-28 DIAGNOSIS — N2581 Secondary hyperparathyroidism of renal origin: Secondary | ICD-10-CM | POA: Diagnosis not present

## 2017-08-28 DIAGNOSIS — N186 End stage renal disease: Secondary | ICD-10-CM | POA: Diagnosis not present

## 2017-09-01 DIAGNOSIS — N2581 Secondary hyperparathyroidism of renal origin: Secondary | ICD-10-CM | POA: Diagnosis not present

## 2017-09-01 DIAGNOSIS — N186 End stage renal disease: Secondary | ICD-10-CM | POA: Diagnosis not present

## 2017-09-04 DIAGNOSIS — N2581 Secondary hyperparathyroidism of renal origin: Secondary | ICD-10-CM | POA: Diagnosis not present

## 2017-09-04 DIAGNOSIS — N186 End stage renal disease: Secondary | ICD-10-CM | POA: Diagnosis not present

## 2017-09-07 DIAGNOSIS — N2581 Secondary hyperparathyroidism of renal origin: Secondary | ICD-10-CM | POA: Diagnosis not present

## 2017-09-07 DIAGNOSIS — N186 End stage renal disease: Secondary | ICD-10-CM | POA: Diagnosis not present

## 2017-09-09 DIAGNOSIS — N2581 Secondary hyperparathyroidism of renal origin: Secondary | ICD-10-CM | POA: Diagnosis not present

## 2017-09-09 DIAGNOSIS — N186 End stage renal disease: Secondary | ICD-10-CM | POA: Diagnosis not present

## 2017-09-11 DIAGNOSIS — N186 End stage renal disease: Secondary | ICD-10-CM | POA: Diagnosis not present

## 2017-09-11 DIAGNOSIS — N2581 Secondary hyperparathyroidism of renal origin: Secondary | ICD-10-CM | POA: Diagnosis not present

## 2017-09-13 DIAGNOSIS — N186 End stage renal disease: Secondary | ICD-10-CM | POA: Diagnosis not present

## 2017-09-13 DIAGNOSIS — N2581 Secondary hyperparathyroidism of renal origin: Secondary | ICD-10-CM | POA: Diagnosis not present

## 2017-09-16 DIAGNOSIS — N186 End stage renal disease: Secondary | ICD-10-CM | POA: Diagnosis not present

## 2017-09-16 DIAGNOSIS — N2581 Secondary hyperparathyroidism of renal origin: Secondary | ICD-10-CM | POA: Diagnosis not present

## 2017-09-18 DIAGNOSIS — N2581 Secondary hyperparathyroidism of renal origin: Secondary | ICD-10-CM | POA: Diagnosis not present

## 2017-09-18 DIAGNOSIS — N186 End stage renal disease: Secondary | ICD-10-CM | POA: Diagnosis not present

## 2017-09-20 DIAGNOSIS — Z992 Dependence on renal dialysis: Secondary | ICD-10-CM | POA: Diagnosis not present

## 2017-09-20 DIAGNOSIS — Q612 Polycystic kidney, adult type: Secondary | ICD-10-CM | POA: Diagnosis not present

## 2017-09-20 DIAGNOSIS — N186 End stage renal disease: Secondary | ICD-10-CM | POA: Diagnosis not present

## 2017-09-20 DIAGNOSIS — N2581 Secondary hyperparathyroidism of renal origin: Secondary | ICD-10-CM | POA: Diagnosis not present

## 2017-09-23 DIAGNOSIS — N2581 Secondary hyperparathyroidism of renal origin: Secondary | ICD-10-CM | POA: Diagnosis not present

## 2017-09-23 DIAGNOSIS — D509 Iron deficiency anemia, unspecified: Secondary | ICD-10-CM | POA: Diagnosis not present

## 2017-09-23 DIAGNOSIS — N186 End stage renal disease: Secondary | ICD-10-CM | POA: Diagnosis not present

## 2017-09-25 DIAGNOSIS — N2581 Secondary hyperparathyroidism of renal origin: Secondary | ICD-10-CM | POA: Diagnosis not present

## 2017-09-25 DIAGNOSIS — N186 End stage renal disease: Secondary | ICD-10-CM | POA: Diagnosis not present

## 2017-09-25 DIAGNOSIS — D509 Iron deficiency anemia, unspecified: Secondary | ICD-10-CM | POA: Diagnosis not present

## 2017-09-28 DIAGNOSIS — N186 End stage renal disease: Secondary | ICD-10-CM | POA: Diagnosis not present

## 2017-09-28 DIAGNOSIS — N2581 Secondary hyperparathyroidism of renal origin: Secondary | ICD-10-CM | POA: Diagnosis not present

## 2017-09-28 DIAGNOSIS — D509 Iron deficiency anemia, unspecified: Secondary | ICD-10-CM | POA: Diagnosis not present

## 2017-09-29 ENCOUNTER — Ambulatory Visit (INDEPENDENT_AMBULATORY_CARE_PROVIDER_SITE_OTHER): Payer: Medicare Other | Admitting: Podiatry

## 2017-09-29 ENCOUNTER — Encounter: Payer: Self-pay | Admitting: Podiatry

## 2017-09-29 DIAGNOSIS — B351 Tinea unguium: Secondary | ICD-10-CM

## 2017-09-29 DIAGNOSIS — M79676 Pain in unspecified toe(s): Secondary | ICD-10-CM | POA: Diagnosis not present

## 2017-09-29 NOTE — Progress Notes (Signed)
Complaint:  Visit Type: Patient returns to my office for continued preventative foot care services. Complaint: Patient states" my nails have grown long and thick and become painful to walk and wear shoes" . The patient presents for preventative foot care services. No changes to ROS  Podiatric Exam: Vascular: dorsalis pedis and posterior tibial pulses are palpable bilateral. Capillary return is immediate. Temperature gradient is WNL. Skin turgor WNL  Sensorium: Normal Semmes Weinstein monofilament test. Normal tactile sensation bilaterally. Nail Exam: Pt has thick disfigured discolored nails with subungual debris noted bilateral entire nail hallux through fifth toenails Ulcer Exam: There is no evidence of ulcer or pre-ulcerative changes or infection. Orthopedic Exam: Muscle tone and strength are WNL. No limitations in general ROM. No crepitus or effusions noted. Foot type and digits show no abnormalities. Bony prominences are unremarkable. Skin: No Porokeratosis. No infection or ulcers  Diagnosis:  Onychomycosis, , Pain in right toe, pain in left toes  Treatment & Plan Procedures and Treatment: Consent by patient was obtained for treatment procedures. The patient understood the discussion of treatment and procedures well. All questions were answered thoroughly reviewed. Debridement of mycotic and hypertrophic toenails, 1 through 5 bilateral and clearing of subungual debris. No ulceration, no infection noted.  Return Visit-Office Procedure: Patient instructed to return to the office for a follow up visit 3 months   for continued evaluation and treatment.    Mora Pedraza DPM 

## 2017-09-30 DIAGNOSIS — D509 Iron deficiency anemia, unspecified: Secondary | ICD-10-CM | POA: Diagnosis not present

## 2017-09-30 DIAGNOSIS — N2581 Secondary hyperparathyroidism of renal origin: Secondary | ICD-10-CM | POA: Diagnosis not present

## 2017-09-30 DIAGNOSIS — N186 End stage renal disease: Secondary | ICD-10-CM | POA: Diagnosis not present

## 2017-10-02 DIAGNOSIS — N2581 Secondary hyperparathyroidism of renal origin: Secondary | ICD-10-CM | POA: Diagnosis not present

## 2017-10-02 DIAGNOSIS — N186 End stage renal disease: Secondary | ICD-10-CM | POA: Diagnosis not present

## 2017-10-02 DIAGNOSIS — D509 Iron deficiency anemia, unspecified: Secondary | ICD-10-CM | POA: Diagnosis not present

## 2017-10-05 DIAGNOSIS — N186 End stage renal disease: Secondary | ICD-10-CM | POA: Diagnosis not present

## 2017-10-05 DIAGNOSIS — D509 Iron deficiency anemia, unspecified: Secondary | ICD-10-CM | POA: Diagnosis not present

## 2017-10-05 DIAGNOSIS — N2581 Secondary hyperparathyroidism of renal origin: Secondary | ICD-10-CM | POA: Diagnosis not present

## 2017-10-07 DIAGNOSIS — N186 End stage renal disease: Secondary | ICD-10-CM | POA: Diagnosis not present

## 2017-10-07 DIAGNOSIS — D509 Iron deficiency anemia, unspecified: Secondary | ICD-10-CM | POA: Diagnosis not present

## 2017-10-07 DIAGNOSIS — N2581 Secondary hyperparathyroidism of renal origin: Secondary | ICD-10-CM | POA: Diagnosis not present

## 2017-10-09 DIAGNOSIS — N2581 Secondary hyperparathyroidism of renal origin: Secondary | ICD-10-CM | POA: Diagnosis not present

## 2017-10-09 DIAGNOSIS — D509 Iron deficiency anemia, unspecified: Secondary | ICD-10-CM | POA: Diagnosis not present

## 2017-10-09 DIAGNOSIS — N186 End stage renal disease: Secondary | ICD-10-CM | POA: Diagnosis not present

## 2017-10-12 DIAGNOSIS — D509 Iron deficiency anemia, unspecified: Secondary | ICD-10-CM | POA: Diagnosis not present

## 2017-10-12 DIAGNOSIS — N2581 Secondary hyperparathyroidism of renal origin: Secondary | ICD-10-CM | POA: Diagnosis not present

## 2017-10-12 DIAGNOSIS — N186 End stage renal disease: Secondary | ICD-10-CM | POA: Diagnosis not present

## 2017-10-14 DIAGNOSIS — D509 Iron deficiency anemia, unspecified: Secondary | ICD-10-CM | POA: Diagnosis not present

## 2017-10-14 DIAGNOSIS — N186 End stage renal disease: Secondary | ICD-10-CM | POA: Diagnosis not present

## 2017-10-14 DIAGNOSIS — N2581 Secondary hyperparathyroidism of renal origin: Secondary | ICD-10-CM | POA: Diagnosis not present

## 2017-10-16 DIAGNOSIS — N2581 Secondary hyperparathyroidism of renal origin: Secondary | ICD-10-CM | POA: Diagnosis not present

## 2017-10-16 DIAGNOSIS — N186 End stage renal disease: Secondary | ICD-10-CM | POA: Diagnosis not present

## 2017-10-16 DIAGNOSIS — D509 Iron deficiency anemia, unspecified: Secondary | ICD-10-CM | POA: Diagnosis not present

## 2017-10-19 DIAGNOSIS — N2581 Secondary hyperparathyroidism of renal origin: Secondary | ICD-10-CM | POA: Diagnosis not present

## 2017-10-19 DIAGNOSIS — N186 End stage renal disease: Secondary | ICD-10-CM | POA: Diagnosis not present

## 2017-10-19 DIAGNOSIS — D509 Iron deficiency anemia, unspecified: Secondary | ICD-10-CM | POA: Diagnosis not present

## 2017-10-21 DIAGNOSIS — N2581 Secondary hyperparathyroidism of renal origin: Secondary | ICD-10-CM | POA: Diagnosis not present

## 2017-10-21 DIAGNOSIS — Z992 Dependence on renal dialysis: Secondary | ICD-10-CM | POA: Diagnosis not present

## 2017-10-21 DIAGNOSIS — N186 End stage renal disease: Secondary | ICD-10-CM | POA: Diagnosis not present

## 2017-10-21 DIAGNOSIS — Q612 Polycystic kidney, adult type: Secondary | ICD-10-CM | POA: Diagnosis not present

## 2017-10-21 DIAGNOSIS — D509 Iron deficiency anemia, unspecified: Secondary | ICD-10-CM | POA: Diagnosis not present

## 2017-10-22 DIAGNOSIS — Z992 Dependence on renal dialysis: Secondary | ICD-10-CM | POA: Diagnosis not present

## 2017-10-22 DIAGNOSIS — Q612 Polycystic kidney, adult type: Secondary | ICD-10-CM | POA: Diagnosis not present

## 2017-10-22 DIAGNOSIS — N186 End stage renal disease: Secondary | ICD-10-CM | POA: Diagnosis not present

## 2017-10-23 DIAGNOSIS — N186 End stage renal disease: Secondary | ICD-10-CM | POA: Diagnosis not present

## 2017-10-23 DIAGNOSIS — N2581 Secondary hyperparathyroidism of renal origin: Secondary | ICD-10-CM | POA: Diagnosis not present

## 2017-10-26 DIAGNOSIS — N2581 Secondary hyperparathyroidism of renal origin: Secondary | ICD-10-CM | POA: Diagnosis not present

## 2017-10-26 DIAGNOSIS — N186 End stage renal disease: Secondary | ICD-10-CM | POA: Diagnosis not present

## 2017-10-28 DIAGNOSIS — N186 End stage renal disease: Secondary | ICD-10-CM | POA: Diagnosis not present

## 2017-10-28 DIAGNOSIS — N2581 Secondary hyperparathyroidism of renal origin: Secondary | ICD-10-CM | POA: Diagnosis not present

## 2017-10-30 DIAGNOSIS — N186 End stage renal disease: Secondary | ICD-10-CM | POA: Diagnosis not present

## 2017-10-30 DIAGNOSIS — N2581 Secondary hyperparathyroidism of renal origin: Secondary | ICD-10-CM | POA: Diagnosis not present

## 2017-11-02 DIAGNOSIS — N2581 Secondary hyperparathyroidism of renal origin: Secondary | ICD-10-CM | POA: Diagnosis not present

## 2017-11-02 DIAGNOSIS — N186 End stage renal disease: Secondary | ICD-10-CM | POA: Diagnosis not present

## 2017-11-06 DIAGNOSIS — N2581 Secondary hyperparathyroidism of renal origin: Secondary | ICD-10-CM | POA: Diagnosis not present

## 2017-11-06 DIAGNOSIS — N186 End stage renal disease: Secondary | ICD-10-CM | POA: Diagnosis not present

## 2017-11-08 DIAGNOSIS — N186 End stage renal disease: Secondary | ICD-10-CM | POA: Diagnosis not present

## 2017-11-08 DIAGNOSIS — N2581 Secondary hyperparathyroidism of renal origin: Secondary | ICD-10-CM | POA: Diagnosis not present

## 2017-11-09 DIAGNOSIS — N2581 Secondary hyperparathyroidism of renal origin: Secondary | ICD-10-CM | POA: Diagnosis not present

## 2017-11-09 DIAGNOSIS — N186 End stage renal disease: Secondary | ICD-10-CM | POA: Diagnosis not present

## 2017-11-11 DIAGNOSIS — N186 End stage renal disease: Secondary | ICD-10-CM | POA: Diagnosis not present

## 2017-11-11 DIAGNOSIS — N2581 Secondary hyperparathyroidism of renal origin: Secondary | ICD-10-CM | POA: Diagnosis not present

## 2017-11-13 DIAGNOSIS — N186 End stage renal disease: Secondary | ICD-10-CM | POA: Diagnosis not present

## 2017-11-13 DIAGNOSIS — N2581 Secondary hyperparathyroidism of renal origin: Secondary | ICD-10-CM | POA: Diagnosis not present

## 2017-11-16 DIAGNOSIS — N2581 Secondary hyperparathyroidism of renal origin: Secondary | ICD-10-CM | POA: Diagnosis not present

## 2017-11-16 DIAGNOSIS — N186 End stage renal disease: Secondary | ICD-10-CM | POA: Diagnosis not present

## 2017-11-18 DIAGNOSIS — N186 End stage renal disease: Secondary | ICD-10-CM | POA: Diagnosis not present

## 2017-11-18 DIAGNOSIS — N2581 Secondary hyperparathyroidism of renal origin: Secondary | ICD-10-CM | POA: Diagnosis not present

## 2017-11-19 DIAGNOSIS — N186 End stage renal disease: Secondary | ICD-10-CM | POA: Diagnosis not present

## 2017-11-19 DIAGNOSIS — Q612 Polycystic kidney, adult type: Secondary | ICD-10-CM | POA: Diagnosis not present

## 2017-11-19 DIAGNOSIS — Z992 Dependence on renal dialysis: Secondary | ICD-10-CM | POA: Diagnosis not present

## 2017-11-20 DIAGNOSIS — N2581 Secondary hyperparathyroidism of renal origin: Secondary | ICD-10-CM | POA: Diagnosis not present

## 2017-11-20 DIAGNOSIS — N186 End stage renal disease: Secondary | ICD-10-CM | POA: Diagnosis not present

## 2017-11-23 DIAGNOSIS — N2581 Secondary hyperparathyroidism of renal origin: Secondary | ICD-10-CM | POA: Diagnosis not present

## 2017-11-23 DIAGNOSIS — N186 End stage renal disease: Secondary | ICD-10-CM | POA: Diagnosis not present

## 2017-11-25 DIAGNOSIS — N186 End stage renal disease: Secondary | ICD-10-CM | POA: Diagnosis not present

## 2017-11-25 DIAGNOSIS — N2581 Secondary hyperparathyroidism of renal origin: Secondary | ICD-10-CM | POA: Diagnosis not present

## 2017-11-27 DIAGNOSIS — N2581 Secondary hyperparathyroidism of renal origin: Secondary | ICD-10-CM | POA: Diagnosis not present

## 2017-11-27 DIAGNOSIS — N186 End stage renal disease: Secondary | ICD-10-CM | POA: Diagnosis not present

## 2017-11-30 DIAGNOSIS — N186 End stage renal disease: Secondary | ICD-10-CM | POA: Diagnosis not present

## 2017-11-30 DIAGNOSIS — N2581 Secondary hyperparathyroidism of renal origin: Secondary | ICD-10-CM | POA: Diagnosis not present

## 2017-12-02 DIAGNOSIS — N2581 Secondary hyperparathyroidism of renal origin: Secondary | ICD-10-CM | POA: Diagnosis not present

## 2017-12-02 DIAGNOSIS — N186 End stage renal disease: Secondary | ICD-10-CM | POA: Diagnosis not present

## 2017-12-04 DIAGNOSIS — N2581 Secondary hyperparathyroidism of renal origin: Secondary | ICD-10-CM | POA: Diagnosis not present

## 2017-12-04 DIAGNOSIS — N186 End stage renal disease: Secondary | ICD-10-CM | POA: Diagnosis not present

## 2017-12-07 DIAGNOSIS — N2581 Secondary hyperparathyroidism of renal origin: Secondary | ICD-10-CM | POA: Diagnosis not present

## 2017-12-07 DIAGNOSIS — N186 End stage renal disease: Secondary | ICD-10-CM | POA: Diagnosis not present

## 2017-12-09 DIAGNOSIS — N2581 Secondary hyperparathyroidism of renal origin: Secondary | ICD-10-CM | POA: Diagnosis not present

## 2017-12-09 DIAGNOSIS — N186 End stage renal disease: Secondary | ICD-10-CM | POA: Diagnosis not present

## 2017-12-10 DIAGNOSIS — N186 End stage renal disease: Secondary | ICD-10-CM | POA: Diagnosis not present

## 2017-12-10 DIAGNOSIS — T82858A Stenosis of vascular prosthetic devices, implants and grafts, initial encounter: Secondary | ICD-10-CM | POA: Diagnosis not present

## 2017-12-10 DIAGNOSIS — Z992 Dependence on renal dialysis: Secondary | ICD-10-CM | POA: Diagnosis not present

## 2017-12-10 DIAGNOSIS — I871 Compression of vein: Secondary | ICD-10-CM | POA: Diagnosis not present

## 2017-12-11 DIAGNOSIS — N186 End stage renal disease: Secondary | ICD-10-CM | POA: Diagnosis not present

## 2017-12-11 DIAGNOSIS — N2581 Secondary hyperparathyroidism of renal origin: Secondary | ICD-10-CM | POA: Diagnosis not present

## 2017-12-14 DIAGNOSIS — N2581 Secondary hyperparathyroidism of renal origin: Secondary | ICD-10-CM | POA: Diagnosis not present

## 2017-12-14 DIAGNOSIS — N186 End stage renal disease: Secondary | ICD-10-CM | POA: Diagnosis not present

## 2017-12-18 DIAGNOSIS — N2581 Secondary hyperparathyroidism of renal origin: Secondary | ICD-10-CM | POA: Diagnosis not present

## 2017-12-18 DIAGNOSIS — N186 End stage renal disease: Secondary | ICD-10-CM | POA: Diagnosis not present

## 2017-12-20 DIAGNOSIS — Q612 Polycystic kidney, adult type: Secondary | ICD-10-CM | POA: Diagnosis not present

## 2017-12-20 DIAGNOSIS — Z992 Dependence on renal dialysis: Secondary | ICD-10-CM | POA: Diagnosis not present

## 2017-12-20 DIAGNOSIS — N186 End stage renal disease: Secondary | ICD-10-CM | POA: Diagnosis not present

## 2017-12-21 DIAGNOSIS — N186 End stage renal disease: Secondary | ICD-10-CM | POA: Diagnosis not present

## 2017-12-21 DIAGNOSIS — N2581 Secondary hyperparathyroidism of renal origin: Secondary | ICD-10-CM | POA: Diagnosis not present

## 2017-12-23 DIAGNOSIS — N186 End stage renal disease: Secondary | ICD-10-CM | POA: Diagnosis not present

## 2017-12-23 DIAGNOSIS — N2581 Secondary hyperparathyroidism of renal origin: Secondary | ICD-10-CM | POA: Diagnosis not present

## 2017-12-25 DIAGNOSIS — N186 End stage renal disease: Secondary | ICD-10-CM | POA: Diagnosis not present

## 2017-12-25 DIAGNOSIS — N2581 Secondary hyperparathyroidism of renal origin: Secondary | ICD-10-CM | POA: Diagnosis not present

## 2017-12-28 DIAGNOSIS — N186 End stage renal disease: Secondary | ICD-10-CM | POA: Diagnosis not present

## 2017-12-28 DIAGNOSIS — N2581 Secondary hyperparathyroidism of renal origin: Secondary | ICD-10-CM | POA: Diagnosis not present

## 2017-12-29 ENCOUNTER — Ambulatory Visit (INDEPENDENT_AMBULATORY_CARE_PROVIDER_SITE_OTHER): Payer: Medicare Other | Admitting: Podiatry

## 2017-12-29 ENCOUNTER — Encounter: Payer: Self-pay | Admitting: Podiatry

## 2017-12-29 DIAGNOSIS — M79676 Pain in unspecified toe(s): Secondary | ICD-10-CM

## 2017-12-29 DIAGNOSIS — B351 Tinea unguium: Secondary | ICD-10-CM

## 2017-12-29 DIAGNOSIS — M79674 Pain in right toe(s): Secondary | ICD-10-CM

## 2017-12-29 DIAGNOSIS — M79675 Pain in left toe(s): Secondary | ICD-10-CM

## 2017-12-29 NOTE — Progress Notes (Signed)
Complaint:  Visit Type: Patient returns to my office for continued preventative foot care services. Complaint: Patient states" my nails have grown long and thick and become painful to walk and wear shoes" . The patient presents for preventative foot care services. No changes to ROS  Podiatric Exam: Vascular: dorsalis pedis and posterior tibial pulses are palpable bilateral. Capillary return is immediate. Temperature gradient is WNL. Skin turgor WNL  Sensorium: Normal Semmes Weinstein monofilament test. Normal tactile sensation bilaterally. Nail Exam: Pt has thick disfigured discolored nails with subungual debris noted bilateral entire nail hallux through fifth toenails Ulcer Exam: There is no evidence of ulcer or pre-ulcerative changes or infection. Orthopedic Exam: Muscle tone and strength are WNL. No limitations in general ROM. No crepitus or effusions noted. Foot type and digits show no abnormalities. Bony prominences are unremarkable. Skin: No Porokeratosis. No infection or ulcers  Diagnosis:  Onychomycosis, , Pain in right toe, pain in left toes  Treatment & Plan Procedures and Treatment: Consent by patient was obtained for treatment procedures. The patient understood the discussion of treatment and procedures well. All questions were answered thoroughly reviewed. Debridement of mycotic and hypertrophic toenails, 1 through 5 bilateral and clearing of subungual debris. No ulceration, no infection noted.  Return Visit-Office Procedure: Patient instructed to return to the office for a follow up visit 3 months   for continued evaluation and treatment.    Riley Hallum DPM 

## 2017-12-30 DIAGNOSIS — N186 End stage renal disease: Secondary | ICD-10-CM | POA: Diagnosis not present

## 2017-12-30 DIAGNOSIS — N2581 Secondary hyperparathyroidism of renal origin: Secondary | ICD-10-CM | POA: Diagnosis not present

## 2018-01-01 DIAGNOSIS — N2581 Secondary hyperparathyroidism of renal origin: Secondary | ICD-10-CM | POA: Diagnosis not present

## 2018-01-01 DIAGNOSIS — N186 End stage renal disease: Secondary | ICD-10-CM | POA: Diagnosis not present

## 2018-01-04 DIAGNOSIS — N2581 Secondary hyperparathyroidism of renal origin: Secondary | ICD-10-CM | POA: Diagnosis not present

## 2018-01-04 DIAGNOSIS — N186 End stage renal disease: Secondary | ICD-10-CM | POA: Diagnosis not present

## 2018-01-06 DIAGNOSIS — N2581 Secondary hyperparathyroidism of renal origin: Secondary | ICD-10-CM | POA: Diagnosis not present

## 2018-01-06 DIAGNOSIS — N186 End stage renal disease: Secondary | ICD-10-CM | POA: Diagnosis not present

## 2018-01-08 DIAGNOSIS — N186 End stage renal disease: Secondary | ICD-10-CM | POA: Diagnosis not present

## 2018-01-08 DIAGNOSIS — N2581 Secondary hyperparathyroidism of renal origin: Secondary | ICD-10-CM | POA: Diagnosis not present

## 2018-01-11 DIAGNOSIS — N2581 Secondary hyperparathyroidism of renal origin: Secondary | ICD-10-CM | POA: Diagnosis not present

## 2018-01-11 DIAGNOSIS — N186 End stage renal disease: Secondary | ICD-10-CM | POA: Diagnosis not present

## 2018-01-15 DIAGNOSIS — N2581 Secondary hyperparathyroidism of renal origin: Secondary | ICD-10-CM | POA: Diagnosis not present

## 2018-01-15 DIAGNOSIS — N186 End stage renal disease: Secondary | ICD-10-CM | POA: Diagnosis not present

## 2018-01-18 DIAGNOSIS — N2581 Secondary hyperparathyroidism of renal origin: Secondary | ICD-10-CM | POA: Diagnosis not present

## 2018-01-18 DIAGNOSIS — N186 End stage renal disease: Secondary | ICD-10-CM | POA: Diagnosis not present

## 2018-01-19 DIAGNOSIS — N186 End stage renal disease: Secondary | ICD-10-CM | POA: Diagnosis not present

## 2018-01-19 DIAGNOSIS — Q612 Polycystic kidney, adult type: Secondary | ICD-10-CM | POA: Diagnosis not present

## 2018-01-19 DIAGNOSIS — Z992 Dependence on renal dialysis: Secondary | ICD-10-CM | POA: Diagnosis not present

## 2018-01-20 DIAGNOSIS — N2581 Secondary hyperparathyroidism of renal origin: Secondary | ICD-10-CM | POA: Diagnosis not present

## 2018-01-20 DIAGNOSIS — N186 End stage renal disease: Secondary | ICD-10-CM | POA: Diagnosis not present

## 2018-01-22 DIAGNOSIS — N186 End stage renal disease: Secondary | ICD-10-CM | POA: Diagnosis not present

## 2018-01-22 DIAGNOSIS — N2581 Secondary hyperparathyroidism of renal origin: Secondary | ICD-10-CM | POA: Diagnosis not present

## 2018-01-25 DIAGNOSIS — N186 End stage renal disease: Secondary | ICD-10-CM | POA: Diagnosis not present

## 2018-01-25 DIAGNOSIS — N2581 Secondary hyperparathyroidism of renal origin: Secondary | ICD-10-CM | POA: Diagnosis not present

## 2018-01-27 DIAGNOSIS — N2581 Secondary hyperparathyroidism of renal origin: Secondary | ICD-10-CM | POA: Diagnosis not present

## 2018-01-27 DIAGNOSIS — N186 End stage renal disease: Secondary | ICD-10-CM | POA: Diagnosis not present

## 2018-01-29 DIAGNOSIS — N186 End stage renal disease: Secondary | ICD-10-CM | POA: Diagnosis not present

## 2018-01-29 DIAGNOSIS — N2581 Secondary hyperparathyroidism of renal origin: Secondary | ICD-10-CM | POA: Diagnosis not present

## 2018-02-01 DIAGNOSIS — N2581 Secondary hyperparathyroidism of renal origin: Secondary | ICD-10-CM | POA: Diagnosis not present

## 2018-02-01 DIAGNOSIS — N186 End stage renal disease: Secondary | ICD-10-CM | POA: Diagnosis not present

## 2018-02-03 DIAGNOSIS — N2581 Secondary hyperparathyroidism of renal origin: Secondary | ICD-10-CM | POA: Diagnosis not present

## 2018-02-03 DIAGNOSIS — N186 End stage renal disease: Secondary | ICD-10-CM | POA: Diagnosis not present

## 2018-02-05 DIAGNOSIS — N2581 Secondary hyperparathyroidism of renal origin: Secondary | ICD-10-CM | POA: Diagnosis not present

## 2018-02-05 DIAGNOSIS — N186 End stage renal disease: Secondary | ICD-10-CM | POA: Diagnosis not present

## 2018-02-08 DIAGNOSIS — N186 End stage renal disease: Secondary | ICD-10-CM | POA: Diagnosis not present

## 2018-02-08 DIAGNOSIS — N2581 Secondary hyperparathyroidism of renal origin: Secondary | ICD-10-CM | POA: Diagnosis not present

## 2018-02-10 DIAGNOSIS — N2581 Secondary hyperparathyroidism of renal origin: Secondary | ICD-10-CM | POA: Diagnosis not present

## 2018-02-10 DIAGNOSIS — N186 End stage renal disease: Secondary | ICD-10-CM | POA: Diagnosis not present

## 2018-02-12 DIAGNOSIS — N2581 Secondary hyperparathyroidism of renal origin: Secondary | ICD-10-CM | POA: Diagnosis not present

## 2018-02-12 DIAGNOSIS — N186 End stage renal disease: Secondary | ICD-10-CM | POA: Diagnosis not present

## 2018-02-15 DIAGNOSIS — N2581 Secondary hyperparathyroidism of renal origin: Secondary | ICD-10-CM | POA: Diagnosis not present

## 2018-02-15 DIAGNOSIS — N186 End stage renal disease: Secondary | ICD-10-CM | POA: Diagnosis not present

## 2018-02-17 DIAGNOSIS — N2581 Secondary hyperparathyroidism of renal origin: Secondary | ICD-10-CM | POA: Diagnosis not present

## 2018-02-17 DIAGNOSIS — N186 End stage renal disease: Secondary | ICD-10-CM | POA: Diagnosis not present

## 2018-02-19 DIAGNOSIS — N186 End stage renal disease: Secondary | ICD-10-CM | POA: Diagnosis not present

## 2018-02-19 DIAGNOSIS — Q612 Polycystic kidney, adult type: Secondary | ICD-10-CM | POA: Diagnosis not present

## 2018-02-19 DIAGNOSIS — N2581 Secondary hyperparathyroidism of renal origin: Secondary | ICD-10-CM | POA: Diagnosis not present

## 2018-02-19 DIAGNOSIS — Z992 Dependence on renal dialysis: Secondary | ICD-10-CM | POA: Diagnosis not present

## 2018-02-22 DIAGNOSIS — N2581 Secondary hyperparathyroidism of renal origin: Secondary | ICD-10-CM | POA: Diagnosis not present

## 2018-02-22 DIAGNOSIS — N186 End stage renal disease: Secondary | ICD-10-CM | POA: Diagnosis not present

## 2018-02-24 DIAGNOSIS — N2581 Secondary hyperparathyroidism of renal origin: Secondary | ICD-10-CM | POA: Diagnosis not present

## 2018-02-24 DIAGNOSIS — N186 End stage renal disease: Secondary | ICD-10-CM | POA: Diagnosis not present

## 2018-02-26 DIAGNOSIS — N186 End stage renal disease: Secondary | ICD-10-CM | POA: Diagnosis not present

## 2018-02-26 DIAGNOSIS — N2581 Secondary hyperparathyroidism of renal origin: Secondary | ICD-10-CM | POA: Diagnosis not present

## 2018-03-01 DIAGNOSIS — N2581 Secondary hyperparathyroidism of renal origin: Secondary | ICD-10-CM | POA: Diagnosis not present

## 2018-03-01 DIAGNOSIS — N186 End stage renal disease: Secondary | ICD-10-CM | POA: Diagnosis not present

## 2018-03-03 DIAGNOSIS — N2581 Secondary hyperparathyroidism of renal origin: Secondary | ICD-10-CM | POA: Diagnosis not present

## 2018-03-03 DIAGNOSIS — N186 End stage renal disease: Secondary | ICD-10-CM | POA: Diagnosis not present

## 2018-03-05 DIAGNOSIS — N2581 Secondary hyperparathyroidism of renal origin: Secondary | ICD-10-CM | POA: Diagnosis not present

## 2018-03-05 DIAGNOSIS — N186 End stage renal disease: Secondary | ICD-10-CM | POA: Diagnosis not present

## 2018-03-08 DIAGNOSIS — N2581 Secondary hyperparathyroidism of renal origin: Secondary | ICD-10-CM | POA: Diagnosis not present

## 2018-03-08 DIAGNOSIS — N186 End stage renal disease: Secondary | ICD-10-CM | POA: Diagnosis not present

## 2018-03-09 ENCOUNTER — Ambulatory Visit: Payer: Medicare Other | Admitting: Podiatry

## 2018-03-12 DIAGNOSIS — N186 End stage renal disease: Secondary | ICD-10-CM | POA: Diagnosis not present

## 2018-03-12 DIAGNOSIS — N2581 Secondary hyperparathyroidism of renal origin: Secondary | ICD-10-CM | POA: Diagnosis not present

## 2018-03-15 DIAGNOSIS — N186 End stage renal disease: Secondary | ICD-10-CM | POA: Diagnosis not present

## 2018-03-15 DIAGNOSIS — N2581 Secondary hyperparathyroidism of renal origin: Secondary | ICD-10-CM | POA: Diagnosis not present

## 2018-03-17 DIAGNOSIS — N2581 Secondary hyperparathyroidism of renal origin: Secondary | ICD-10-CM | POA: Diagnosis not present

## 2018-03-17 DIAGNOSIS — N186 End stage renal disease: Secondary | ICD-10-CM | POA: Diagnosis not present

## 2018-03-19 DIAGNOSIS — N2581 Secondary hyperparathyroidism of renal origin: Secondary | ICD-10-CM | POA: Diagnosis not present

## 2018-03-19 DIAGNOSIS — N186 End stage renal disease: Secondary | ICD-10-CM | POA: Diagnosis not present

## 2018-03-21 DIAGNOSIS — Q612 Polycystic kidney, adult type: Secondary | ICD-10-CM | POA: Diagnosis not present

## 2018-03-21 DIAGNOSIS — N186 End stage renal disease: Secondary | ICD-10-CM | POA: Diagnosis not present

## 2018-03-21 DIAGNOSIS — Z992 Dependence on renal dialysis: Secondary | ICD-10-CM | POA: Diagnosis not present

## 2018-03-22 DIAGNOSIS — N186 End stage renal disease: Secondary | ICD-10-CM | POA: Diagnosis not present

## 2018-03-22 DIAGNOSIS — N2581 Secondary hyperparathyroidism of renal origin: Secondary | ICD-10-CM | POA: Diagnosis not present

## 2018-03-24 DIAGNOSIS — N186 End stage renal disease: Secondary | ICD-10-CM | POA: Diagnosis not present

## 2018-03-24 DIAGNOSIS — N2581 Secondary hyperparathyroidism of renal origin: Secondary | ICD-10-CM | POA: Diagnosis not present

## 2018-03-26 DIAGNOSIS — N186 End stage renal disease: Secondary | ICD-10-CM | POA: Diagnosis not present

## 2018-03-26 DIAGNOSIS — N2581 Secondary hyperparathyroidism of renal origin: Secondary | ICD-10-CM | POA: Diagnosis not present

## 2018-03-29 DIAGNOSIS — N2581 Secondary hyperparathyroidism of renal origin: Secondary | ICD-10-CM | POA: Diagnosis not present

## 2018-03-29 DIAGNOSIS — N186 End stage renal disease: Secondary | ICD-10-CM | POA: Diagnosis not present

## 2018-03-31 DIAGNOSIS — N2581 Secondary hyperparathyroidism of renal origin: Secondary | ICD-10-CM | POA: Diagnosis not present

## 2018-03-31 DIAGNOSIS — N186 End stage renal disease: Secondary | ICD-10-CM | POA: Diagnosis not present

## 2018-04-02 DIAGNOSIS — N2581 Secondary hyperparathyroidism of renal origin: Secondary | ICD-10-CM | POA: Diagnosis not present

## 2018-04-02 DIAGNOSIS — N186 End stage renal disease: Secondary | ICD-10-CM | POA: Diagnosis not present

## 2018-04-05 DIAGNOSIS — N2581 Secondary hyperparathyroidism of renal origin: Secondary | ICD-10-CM | POA: Diagnosis not present

## 2018-04-05 DIAGNOSIS — N186 End stage renal disease: Secondary | ICD-10-CM | POA: Diagnosis not present

## 2018-04-08 ENCOUNTER — Encounter: Payer: Self-pay | Admitting: Podiatry

## 2018-04-08 ENCOUNTER — Ambulatory Visit (INDEPENDENT_AMBULATORY_CARE_PROVIDER_SITE_OTHER): Payer: Medicare Other | Admitting: Podiatry

## 2018-04-08 DIAGNOSIS — B351 Tinea unguium: Secondary | ICD-10-CM | POA: Diagnosis not present

## 2018-04-08 DIAGNOSIS — M79676 Pain in unspecified toe(s): Secondary | ICD-10-CM

## 2018-04-08 DIAGNOSIS — M79674 Pain in right toe(s): Secondary | ICD-10-CM

## 2018-04-08 DIAGNOSIS — M79675 Pain in left toe(s): Secondary | ICD-10-CM | POA: Diagnosis not present

## 2018-04-08 NOTE — Progress Notes (Addendum)
Complaint:  Visit Type: Patient returns to my office for continued preventative foot care services. Complaint: Patient states" my nails have grown long and thick and become painful to walk and wear shoes"  The patient presents for preventative foot care services. No changes to ROS  Podiatric Exam: Vascular: dorsalis pedis and posterior tibial pulses are palpable bilateral. Capillary return is immediate. Temperature gradient is WNL. Skin turgor WNL  Sensorium: Normal Semmes Weinstein monofilament test. Normal tactile sensation bilaterally. Nail Exam: Pt has thick disfigured discolored nails with subungual debris noted bilateral entire nail hallux through fifth toenails Ulcer Exam: There is no evidence of ulcer or pre-ulcerative changes or infection. Orthopedic Exam: Muscle tone and strength are WNL. No limitations in general ROM. No crepitus or effusions noted. Foot type and digits show no abnormalities. Bony prominences are unremarkable. Skin: No Porokeratosis. No infection or ulcers  Diagnosis:  Onychomycosis, , Pain in right toe, pain in left toes  Treatment & Plan Procedures and Treatment: Consent by patient was obtained for treatment procedures. The patient understood the discussion of treatment and procedures well. All questions were answered thoroughly reviewed. Debridement of mycotic and hypertrophic toenails, 1 through 5 bilateral and clearing of subungual debris. No ulceration, no infection noted. ABN signed for 2019. Return Visit-Office Procedure: Patient instructed to return to the office for a follow up visit 3 months for continued evaluation and treatment.    Gardiner Barefoot DPM

## 2018-04-09 DIAGNOSIS — N2581 Secondary hyperparathyroidism of renal origin: Secondary | ICD-10-CM | POA: Diagnosis not present

## 2018-04-09 DIAGNOSIS — N186 End stage renal disease: Secondary | ICD-10-CM | POA: Diagnosis not present

## 2018-04-12 DIAGNOSIS — N2581 Secondary hyperparathyroidism of renal origin: Secondary | ICD-10-CM | POA: Diagnosis not present

## 2018-04-12 DIAGNOSIS — N186 End stage renal disease: Secondary | ICD-10-CM | POA: Diagnosis not present

## 2018-04-14 DIAGNOSIS — N2581 Secondary hyperparathyroidism of renal origin: Secondary | ICD-10-CM | POA: Diagnosis not present

## 2018-04-14 DIAGNOSIS — N186 End stage renal disease: Secondary | ICD-10-CM | POA: Diagnosis not present

## 2018-04-16 DIAGNOSIS — N186 End stage renal disease: Secondary | ICD-10-CM | POA: Diagnosis not present

## 2018-04-16 DIAGNOSIS — N2581 Secondary hyperparathyroidism of renal origin: Secondary | ICD-10-CM | POA: Diagnosis not present

## 2018-04-19 DIAGNOSIS — N2581 Secondary hyperparathyroidism of renal origin: Secondary | ICD-10-CM | POA: Diagnosis not present

## 2018-04-19 DIAGNOSIS — N186 End stage renal disease: Secondary | ICD-10-CM | POA: Diagnosis not present

## 2018-04-21 DIAGNOSIS — Q612 Polycystic kidney, adult type: Secondary | ICD-10-CM | POA: Diagnosis not present

## 2018-04-21 DIAGNOSIS — N2581 Secondary hyperparathyroidism of renal origin: Secondary | ICD-10-CM | POA: Diagnosis not present

## 2018-04-21 DIAGNOSIS — Z992 Dependence on renal dialysis: Secondary | ICD-10-CM | POA: Diagnosis not present

## 2018-04-21 DIAGNOSIS — N186 End stage renal disease: Secondary | ICD-10-CM | POA: Diagnosis not present

## 2018-04-23 DIAGNOSIS — N2581 Secondary hyperparathyroidism of renal origin: Secondary | ICD-10-CM | POA: Diagnosis not present

## 2018-04-23 DIAGNOSIS — N186 End stage renal disease: Secondary | ICD-10-CM | POA: Diagnosis not present

## 2018-04-26 DIAGNOSIS — N186 End stage renal disease: Secondary | ICD-10-CM | POA: Diagnosis not present

## 2018-04-26 DIAGNOSIS — N2581 Secondary hyperparathyroidism of renal origin: Secondary | ICD-10-CM | POA: Diagnosis not present

## 2018-04-28 ENCOUNTER — Telehealth (HOSPITAL_COMMUNITY): Payer: Self-pay | Admitting: Surgery

## 2018-04-28 DIAGNOSIS — N2581 Secondary hyperparathyroidism of renal origin: Secondary | ICD-10-CM | POA: Diagnosis not present

## 2018-04-28 DIAGNOSIS — N186 End stage renal disease: Secondary | ICD-10-CM | POA: Diagnosis not present

## 2018-04-28 NOTE — Telephone Encounter (Signed)
04/28/18,received order from [place] for patient to have ABI's- voicemail left on pts home # [phone] asking pt to call [facility] to arrange an appt.

## 2018-04-30 DIAGNOSIS — N2581 Secondary hyperparathyroidism of renal origin: Secondary | ICD-10-CM | POA: Diagnosis not present

## 2018-04-30 DIAGNOSIS — N186 End stage renal disease: Secondary | ICD-10-CM | POA: Diagnosis not present

## 2018-05-03 ENCOUNTER — Other Ambulatory Visit (HOSPITAL_COMMUNITY): Payer: Self-pay | Admitting: Nephrology

## 2018-05-03 DIAGNOSIS — N186 End stage renal disease: Secondary | ICD-10-CM | POA: Diagnosis not present

## 2018-05-03 DIAGNOSIS — N2581 Secondary hyperparathyroidism of renal origin: Secondary | ICD-10-CM | POA: Diagnosis not present

## 2018-05-03 DIAGNOSIS — I739 Peripheral vascular disease, unspecified: Secondary | ICD-10-CM

## 2018-05-04 ENCOUNTER — Ambulatory Visit (HOSPITAL_COMMUNITY)
Admission: RE | Admit: 2018-05-04 | Discharge: 2018-05-04 | Disposition: A | Discharge status: Home / Self Care | Payer: Medicare Other | Source: Ambulatory Visit | Attending: Vascular Surgery | Admitting: Vascular Surgery

## 2018-05-04 DIAGNOSIS — Z87891 Personal history of nicotine dependence: Secondary | ICD-10-CM | POA: Insufficient documentation

## 2018-05-04 DIAGNOSIS — I739 Peripheral vascular disease, unspecified: Secondary | ICD-10-CM | POA: Insufficient documentation

## 2018-05-04 DIAGNOSIS — R200 Anesthesia of skin: Secondary | ICD-10-CM | POA: Insufficient documentation

## 2018-05-05 DIAGNOSIS — N2581 Secondary hyperparathyroidism of renal origin: Secondary | ICD-10-CM | POA: Diagnosis not present

## 2018-05-05 DIAGNOSIS — N186 End stage renal disease: Secondary | ICD-10-CM | POA: Diagnosis not present

## 2018-05-07 DIAGNOSIS — N186 End stage renal disease: Secondary | ICD-10-CM | POA: Diagnosis not present

## 2018-05-07 DIAGNOSIS — N2581 Secondary hyperparathyroidism of renal origin: Secondary | ICD-10-CM | POA: Diagnosis not present

## 2018-05-10 DIAGNOSIS — N2581 Secondary hyperparathyroidism of renal origin: Secondary | ICD-10-CM | POA: Diagnosis not present

## 2018-05-10 DIAGNOSIS — N186 End stage renal disease: Secondary | ICD-10-CM | POA: Diagnosis not present

## 2018-05-12 DIAGNOSIS — N2581 Secondary hyperparathyroidism of renal origin: Secondary | ICD-10-CM | POA: Diagnosis not present

## 2018-05-12 DIAGNOSIS — N186 End stage renal disease: Secondary | ICD-10-CM | POA: Diagnosis not present

## 2018-05-14 DIAGNOSIS — N186 End stage renal disease: Secondary | ICD-10-CM | POA: Diagnosis not present

## 2018-05-14 DIAGNOSIS — N2581 Secondary hyperparathyroidism of renal origin: Secondary | ICD-10-CM | POA: Diagnosis not present

## 2018-05-17 DIAGNOSIS — N186 End stage renal disease: Secondary | ICD-10-CM | POA: Diagnosis not present

## 2018-05-17 DIAGNOSIS — N2581 Secondary hyperparathyroidism of renal origin: Secondary | ICD-10-CM | POA: Diagnosis not present

## 2018-05-19 DIAGNOSIS — N186 End stage renal disease: Secondary | ICD-10-CM | POA: Diagnosis not present

## 2018-05-19 DIAGNOSIS — N2581 Secondary hyperparathyroidism of renal origin: Secondary | ICD-10-CM | POA: Diagnosis not present

## 2018-05-21 DIAGNOSIS — N186 End stage renal disease: Secondary | ICD-10-CM | POA: Diagnosis not present

## 2018-05-21 DIAGNOSIS — N2581 Secondary hyperparathyroidism of renal origin: Secondary | ICD-10-CM | POA: Diagnosis not present

## 2018-05-22 DIAGNOSIS — Q612 Polycystic kidney, adult type: Secondary | ICD-10-CM | POA: Diagnosis not present

## 2018-05-22 DIAGNOSIS — Z992 Dependence on renal dialysis: Secondary | ICD-10-CM | POA: Diagnosis not present

## 2018-05-22 DIAGNOSIS — N186 End stage renal disease: Secondary | ICD-10-CM | POA: Diagnosis not present

## 2018-05-24 DIAGNOSIS — N186 End stage renal disease: Secondary | ICD-10-CM | POA: Diagnosis not present

## 2018-05-24 DIAGNOSIS — N2581 Secondary hyperparathyroidism of renal origin: Secondary | ICD-10-CM | POA: Diagnosis not present

## 2018-05-25 DIAGNOSIS — I871 Compression of vein: Secondary | ICD-10-CM | POA: Diagnosis not present

## 2018-05-25 DIAGNOSIS — N186 End stage renal disease: Secondary | ICD-10-CM | POA: Diagnosis not present

## 2018-05-25 DIAGNOSIS — T82858A Stenosis of vascular prosthetic devices, implants and grafts, initial encounter: Secondary | ICD-10-CM | POA: Diagnosis not present

## 2018-05-25 DIAGNOSIS — Z992 Dependence on renal dialysis: Secondary | ICD-10-CM | POA: Diagnosis not present

## 2018-05-26 DIAGNOSIS — N2581 Secondary hyperparathyroidism of renal origin: Secondary | ICD-10-CM | POA: Diagnosis not present

## 2018-05-26 DIAGNOSIS — N186 End stage renal disease: Secondary | ICD-10-CM | POA: Diagnosis not present

## 2018-05-28 DIAGNOSIS — N186 End stage renal disease: Secondary | ICD-10-CM | POA: Diagnosis not present

## 2018-05-28 DIAGNOSIS — N2581 Secondary hyperparathyroidism of renal origin: Secondary | ICD-10-CM | POA: Diagnosis not present

## 2018-05-31 DIAGNOSIS — N186 End stage renal disease: Secondary | ICD-10-CM | POA: Diagnosis not present

## 2018-05-31 DIAGNOSIS — N2581 Secondary hyperparathyroidism of renal origin: Secondary | ICD-10-CM | POA: Diagnosis not present

## 2018-06-02 DIAGNOSIS — N186 End stage renal disease: Secondary | ICD-10-CM | POA: Diagnosis not present

## 2018-06-02 DIAGNOSIS — N2581 Secondary hyperparathyroidism of renal origin: Secondary | ICD-10-CM | POA: Diagnosis not present

## 2018-06-04 DIAGNOSIS — N186 End stage renal disease: Secondary | ICD-10-CM | POA: Diagnosis not present

## 2018-06-04 DIAGNOSIS — N2581 Secondary hyperparathyroidism of renal origin: Secondary | ICD-10-CM | POA: Diagnosis not present

## 2018-06-07 DIAGNOSIS — N2581 Secondary hyperparathyroidism of renal origin: Secondary | ICD-10-CM | POA: Diagnosis not present

## 2018-06-07 DIAGNOSIS — N186 End stage renal disease: Secondary | ICD-10-CM | POA: Diagnosis not present

## 2018-06-09 DIAGNOSIS — N2581 Secondary hyperparathyroidism of renal origin: Secondary | ICD-10-CM | POA: Diagnosis not present

## 2018-06-09 DIAGNOSIS — N186 End stage renal disease: Secondary | ICD-10-CM | POA: Diagnosis not present

## 2018-06-11 DIAGNOSIS — N186 End stage renal disease: Secondary | ICD-10-CM | POA: Diagnosis not present

## 2018-06-11 DIAGNOSIS — N2581 Secondary hyperparathyroidism of renal origin: Secondary | ICD-10-CM | POA: Diagnosis not present

## 2018-06-14 DIAGNOSIS — N186 End stage renal disease: Secondary | ICD-10-CM | POA: Diagnosis not present

## 2018-06-14 DIAGNOSIS — N2581 Secondary hyperparathyroidism of renal origin: Secondary | ICD-10-CM | POA: Diagnosis not present

## 2018-06-16 DIAGNOSIS — N186 End stage renal disease: Secondary | ICD-10-CM | POA: Diagnosis not present

## 2018-06-16 DIAGNOSIS — N2581 Secondary hyperparathyroidism of renal origin: Secondary | ICD-10-CM | POA: Diagnosis not present

## 2018-06-18 DIAGNOSIS — N186 End stage renal disease: Secondary | ICD-10-CM | POA: Diagnosis not present

## 2018-06-18 DIAGNOSIS — N2581 Secondary hyperparathyroidism of renal origin: Secondary | ICD-10-CM | POA: Diagnosis not present

## 2018-06-21 DIAGNOSIS — N186 End stage renal disease: Secondary | ICD-10-CM | POA: Diagnosis not present

## 2018-06-21 DIAGNOSIS — Q612 Polycystic kidney, adult type: Secondary | ICD-10-CM | POA: Diagnosis not present

## 2018-06-21 DIAGNOSIS — Z992 Dependence on renal dialysis: Secondary | ICD-10-CM | POA: Diagnosis not present

## 2018-06-21 DIAGNOSIS — N2581 Secondary hyperparathyroidism of renal origin: Secondary | ICD-10-CM | POA: Diagnosis not present

## 2018-06-23 DIAGNOSIS — N2581 Secondary hyperparathyroidism of renal origin: Secondary | ICD-10-CM | POA: Diagnosis not present

## 2018-06-23 DIAGNOSIS — N186 End stage renal disease: Secondary | ICD-10-CM | POA: Diagnosis not present

## 2018-06-25 DIAGNOSIS — N2581 Secondary hyperparathyroidism of renal origin: Secondary | ICD-10-CM | POA: Diagnosis not present

## 2018-06-25 DIAGNOSIS — N186 End stage renal disease: Secondary | ICD-10-CM | POA: Diagnosis not present

## 2018-06-28 DIAGNOSIS — N2581 Secondary hyperparathyroidism of renal origin: Secondary | ICD-10-CM | POA: Diagnosis not present

## 2018-06-28 DIAGNOSIS — N186 End stage renal disease: Secondary | ICD-10-CM | POA: Diagnosis not present

## 2018-06-30 DIAGNOSIS — N186 End stage renal disease: Secondary | ICD-10-CM | POA: Diagnosis not present

## 2018-06-30 DIAGNOSIS — N2581 Secondary hyperparathyroidism of renal origin: Secondary | ICD-10-CM | POA: Diagnosis not present

## 2018-07-02 DIAGNOSIS — N186 End stage renal disease: Secondary | ICD-10-CM | POA: Diagnosis not present

## 2018-07-02 DIAGNOSIS — N2581 Secondary hyperparathyroidism of renal origin: Secondary | ICD-10-CM | POA: Diagnosis not present

## 2018-07-05 DIAGNOSIS — N2581 Secondary hyperparathyroidism of renal origin: Secondary | ICD-10-CM | POA: Diagnosis not present

## 2018-07-05 DIAGNOSIS — N186 End stage renal disease: Secondary | ICD-10-CM | POA: Diagnosis not present

## 2018-07-06 ENCOUNTER — Ambulatory Visit (INDEPENDENT_AMBULATORY_CARE_PROVIDER_SITE_OTHER): Payer: Medicare Other | Admitting: Vascular Surgery

## 2018-07-06 ENCOUNTER — Encounter: Payer: Self-pay | Admitting: Vascular Surgery

## 2018-07-06 DIAGNOSIS — I739 Peripheral vascular disease, unspecified: Secondary | ICD-10-CM

## 2018-07-06 NOTE — Progress Notes (Signed)
REASON FOR CONSULT:    Peripheral vascular disease.  The consult is requested by Dr. Lorrene Reid.  HPI:   Sue Neal is a pleasant 72 y.o. female, who was set up for evaluation of peripheral vascular disease.  She describes paresthesias in both feet which occurs at night but also sometimes with walking.  Also states that her feet are cool.  She denies any history of diabetes.  I do not get any clear-cut history of claudication, rest pain, or nonhealing ulcers.  She has end-stage renal disease and dialyzes on Tuesdays Thursdays and Saturdays.  She has a left thigh AV graft.  She apparently underwent thrombolysis on her graft last month.  Her only other complaint is she does have some chronic low back pain.  Past Medical History:  Diagnosis Date  . Anemia   . Arthritis   . Back pain, chronic   . Blood transfusion without reported diagnosis   . Cataract   . Complication of anesthesia    " I woke up during the procedure."  . ESRD (end stage renal disease) on dialysis ([place])    "TTS; [place]" (02/07/2016)  . Hyperparathyroidism, secondary ([place])   . Hypertension    off meds now  . 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 Uterine fibroid     Family History  Problem Relation Age of Onset  . Cancer Mother        COLON  . Colon cancer Mother 41    SOCIAL HISTORY: Social History   Socioeconomic History  . Marital status: Divorced    Spouse name: Not on file  . Number of children: 0  . Years of education: 79  . Highest education level: Not on file  Occupational History  . Occupation: Retired   Scientific laboratory technician  . Financial resource strain: Not on file  . Food insecurity:    Worry: Not on file    Inability: Not on file  . Transportation needs:    Medical: Not on file    Non-medical: Not on file  Tobacco Use  . Smoking status: Former Smoker    Packs/day: 0.10    Types: Cigarettes    Last  attempt to quit: 02/03/1978    Years since quitting: 40.4  . Smokeless tobacco: Never Used  Substance and Sexual Activity  . Alcohol use: Yes    Alcohol/week: 0.0 standard drinks    Comment: rare  . Drug use: No  . Sexual activity: Not on file  Lifestyle  . Physical activity:    Days per week: Not on file    Minutes per session: Not on file  . Stress: Not on file  Relationships  . Social connections:    Talks on phone: Not on file    Gets together: Not on file    Attends religious service: Not on file    Active member of club or organization: Not on file    Attends meetings of clubs or organizations: Not on file    Relationship status: Not on file  . Intimate partner violence:    Fear of current or ex partner: Not on file    Emotionally abused: Not on file    Physically abused: Not on file    Forced sexual activity: Not on file  Other Topics Concern  . Not on file  Social History Narrative   Fun: Bowel, read, try new restaurants.  Denies abuse and feels safe at home.     No Known Allergies  Current Outpatient Medications  Medication Sig Dispense Refill  . acetaminophen (TYLENOL) 500 MG tablet Take 500 mg by mouth 2 (two) times daily as needed for moderate pain (for back).    . cinacalcet (SENSIPAR) 90 MG tablet Take 90 mg by mouth daily after supper. TAKES 2 PILLS (TOTALING 180 MG) DAILY AFTER SUPPER    . ketotifen (ZADITOR) 0.025 % ophthalmic solution Place 1 drop into both eyes daily as needed (for dry eyes).    . multivitamin (RENA-VIT) TABS tablet Take 1 tablet by mouth daily.    . NONFORMULARY OR COMPOUNDED ITEM Pharmazen compound: Nail Fungus - Complex Nail and Foot Disorders - Fluconazole 1%, DMSO 25%, Fluocinonide 0.05%, Ketoconazole 2%, apply 1-2 pumps to each affected nail beds and feet twice daily. 120 each 11  . Nutritional Supplements (FEEDING SUPPLEMENT, NEPRO CARB STEADY,) LIQD Take 237 mLs by mouth daily.    . sevelamer (RENVELA) 800 MG tablet Take  800-4,000 mg by mouth 5 (five) times daily. [phone] mg with snacks and 4000 mg with meals     No current facility-administered medications for this visit.     REVIEW OF SYSTEMS:  [X]  denotes positive finding, [ ]  denotes negative finding Cardiac  Comments:  Chest pain or chest pressure:    Shortness of breath upon exertion: x   Short of breath when lying flat:    Irregular heart rhythm:        Vascular    Pain in calf, thigh, or hip brought on by ambulation:    Pain in feet at night that wakes you up from your sleep:     Blood clot in your veins:    Leg swelling:         Pulmonary    Oxygen at home:    Productive cough:     Wheezing:         Neurologic    Sudden weakness in arms or legs:     Sudden numbness in arms or legs:     Sudden onset of difficulty speaking or slurred speech:    Temporary loss of vision in one eye:     Problems with dizziness:         Gastrointestinal    Blood in stool:     Vomited blood:         Genitourinary    Burning when urinating:     Blood in urine:        Psychiatric    Major depression:         Hematologic    Bleeding problems:    Problems with blood clotting too easily:        Skin    Rashes or ulcers:        Constitutional    Fever or chills:     PHYSICAL EXAM:   There were no vitals filed for this visit.  GENERAL: The patient is a well-nourished female, in no acute distress. The vital signs are documented above. CARDIAC: There is a regular rate and rhythm.  VASCULAR: I do not detect carotid bruits. She has palpable femoral, dorsalis pedis, and posterior tibial pulses bilaterally. She has a biphasic dorsalis pedis and posterior tibial signal bilaterally. She does have some varicose veins in her posterior left calf.  She has no significant hyperpigmentation and no significant leg swelling. PULMONARY: There is good air exchange bilaterally without wheezing or rales. ABDOMEN: Soft  and non-tender with normal pitched bowel  sounds.  MUSCULOSKELETAL: There are no major deformities or cyanosis. NEUROLOGIC: No focal weakness or paresthesias are detected. SKIN: There are no ulcers or rashes noted. PSYCHIATRIC: The patient has a normal affect.  DATA:    ARTERIAL DOPPLER STUDY: I have reviewed the arterial Doppler study that was done on 05/04/2018.   On the right side there is a triphasic dorsalis pedis and posterior tibial signal.  ABIs not reliable as the arteries are noncompressible.  The toe pressure on the right is 66 mmHg.  On the left side, there is a triphasic dorsalis pedis and posterior tibial signal.  Again the arteries are calcified and the ABI is not reliable.  Toe pressure on the left is 52 mmHg.   ASSESSMENT & PLAN:   PARESTHESIAS BOTH FEET: I reassured her that she has excellent arterial perfusion.  I do not think her symptoms can be attributed to peripheral vascular disease.  She has no history of claudication or rest pain.  She has palpable pulses in her feet and biphasic Doppler signals in both feet.  Given that her toe pressures are slightly diminished she may have some small vessel disease in her feet.  She also has some back pain which could potentially be contributing to her paresthesias in her feet also.   [name] [facility] [phone]

## 2018-07-07 DIAGNOSIS — N186 End stage renal disease: Secondary | ICD-10-CM | POA: Diagnosis not present

## 2018-07-07 DIAGNOSIS — N2581 Secondary hyperparathyroidism of renal origin: Secondary | ICD-10-CM | POA: Diagnosis not present

## 2018-07-09 DIAGNOSIS — N2581 Secondary hyperparathyroidism of renal origin: Secondary | ICD-10-CM | POA: Diagnosis not present

## 2018-07-09 DIAGNOSIS — N186 End stage renal disease: Secondary | ICD-10-CM | POA: Diagnosis not present

## 2018-07-12 DIAGNOSIS — N2581 Secondary hyperparathyroidism of renal origin: Secondary | ICD-10-CM | POA: Diagnosis not present

## 2018-07-12 DIAGNOSIS — N186 End stage renal disease: Secondary | ICD-10-CM | POA: Diagnosis not present

## 2018-07-13 ENCOUNTER — Ambulatory Visit: Payer: Medicare Other | Admitting: Podiatry

## 2018-07-14 DIAGNOSIS — N186 End stage renal disease: Secondary | ICD-10-CM | POA: Diagnosis not present

## 2018-07-14 DIAGNOSIS — N2581 Secondary hyperparathyroidism of renal origin: Secondary | ICD-10-CM | POA: Diagnosis not present

## 2018-07-16 DIAGNOSIS — N2581 Secondary hyperparathyroidism of renal origin: Secondary | ICD-10-CM | POA: Diagnosis not present

## 2018-07-16 DIAGNOSIS — N186 End stage renal disease: Secondary | ICD-10-CM | POA: Diagnosis not present

## 2018-07-19 DIAGNOSIS — N2581 Secondary hyperparathyroidism of renal origin: Secondary | ICD-10-CM | POA: Diagnosis not present

## 2018-07-19 DIAGNOSIS — N186 End stage renal disease: Secondary | ICD-10-CM | POA: Diagnosis not present

## 2018-07-21 DIAGNOSIS — N186 End stage renal disease: Secondary | ICD-10-CM | POA: Diagnosis not present

## 2018-07-21 DIAGNOSIS — N2581 Secondary hyperparathyroidism of renal origin: Secondary | ICD-10-CM | POA: Diagnosis not present

## 2018-07-22 DIAGNOSIS — Z992 Dependence on renal dialysis: Secondary | ICD-10-CM | POA: Diagnosis not present

## 2018-07-22 DIAGNOSIS — Q612 Polycystic kidney, adult type: Secondary | ICD-10-CM | POA: Diagnosis not present

## 2018-07-22 DIAGNOSIS — N186 End stage renal disease: Secondary | ICD-10-CM | POA: Diagnosis not present

## 2018-07-23 DIAGNOSIS — N186 End stage renal disease: Secondary | ICD-10-CM | POA: Diagnosis not present

## 2018-07-23 DIAGNOSIS — D259 Leiomyoma of uterus, unspecified: Secondary | ICD-10-CM | POA: Diagnosis not present

## 2018-07-23 DIAGNOSIS — D631 Anemia in chronic kidney disease: Secondary | ICD-10-CM | POA: Diagnosis not present

## 2018-07-23 DIAGNOSIS — N2581 Secondary hyperparathyroidism of renal origin: Secondary | ICD-10-CM | POA: Diagnosis not present

## 2018-07-26 DIAGNOSIS — D631 Anemia in chronic kidney disease: Secondary | ICD-10-CM | POA: Diagnosis not present

## 2018-07-26 DIAGNOSIS — D259 Leiomyoma of uterus, unspecified: Secondary | ICD-10-CM | POA: Diagnosis not present

## 2018-07-26 DIAGNOSIS — N2581 Secondary hyperparathyroidism of renal origin: Secondary | ICD-10-CM | POA: Diagnosis not present

## 2018-07-26 DIAGNOSIS — N186 End stage renal disease: Secondary | ICD-10-CM | POA: Diagnosis not present

## 2018-07-28 DIAGNOSIS — N186 End stage renal disease: Secondary | ICD-10-CM | POA: Diagnosis not present

## 2018-07-28 DIAGNOSIS — D259 Leiomyoma of uterus, unspecified: Secondary | ICD-10-CM | POA: Diagnosis not present

## 2018-07-28 DIAGNOSIS — D631 Anemia in chronic kidney disease: Secondary | ICD-10-CM | POA: Diagnosis not present

## 2018-07-28 DIAGNOSIS — N2581 Secondary hyperparathyroidism of renal origin: Secondary | ICD-10-CM | POA: Diagnosis not present

## 2018-07-29 ENCOUNTER — Encounter: Payer: Self-pay | Admitting: Podiatry

## 2018-07-29 ENCOUNTER — Ambulatory Visit (INDEPENDENT_AMBULATORY_CARE_PROVIDER_SITE_OTHER): Payer: Medicare Other | Admitting: Podiatry

## 2018-07-29 DIAGNOSIS — M79676 Pain in unspecified toe(s): Secondary | ICD-10-CM | POA: Diagnosis not present

## 2018-07-29 DIAGNOSIS — B351 Tinea unguium: Secondary | ICD-10-CM | POA: Diagnosis not present

## 2018-07-29 NOTE — Progress Notes (Signed)
Complaint:  Visit Type: Patient returns to my office for continued preventative foot care services. Complaint: Patient states" my nails have grown long and thick and become painful to walk and wear shoes"  The patient presents for preventative foot care services. No changes to ROS  Podiatric Exam: Vascular: dorsalis pedis and posterior tibial pulses are palpable bilateral. Capillary return is immediate. Temperature gradient is WNL. Skin turgor WNL  Sensorium: Normal Semmes Weinstein monofilament test. Normal tactile sensation bilaterally. Nail Exam: Pt has thick disfigured discolored nails with subungual debris noted bilateral entire nail hallux through fifth toenails Ulcer Exam: There is no evidence of ulcer or pre-ulcerative changes or infection. Orthopedic Exam: Muscle tone and strength are WNL. No limitations in general ROM. No crepitus or effusions noted. Foot type and digits show no abnormalities. Bony prominences are unremarkable. Skin: No Porokeratosis. No infection or ulcers  Diagnosis:  Onychomycosis, , Pain in right toe, pain in left toes  Treatment & Plan Procedures and Treatment: Consent by patient was obtained for treatment procedures. The patient understood the discussion of treatment and procedures well. All questions were answered thoroughly reviewed. Debridement of mycotic and hypertrophic toenails, 1 through 5 bilateral and clearing of subungual debris. No ulceration, no infection noted. ABN signed for 2019. Dispense fungal solution for home usage. Return Visit-Office Procedure: Patient instructed to return to the office for a follow up visit 3 months for continued evaluation and treatment.    Gardiner Barefoot DPM

## 2018-07-30 DIAGNOSIS — N186 End stage renal disease: Secondary | ICD-10-CM | POA: Diagnosis not present

## 2018-07-30 DIAGNOSIS — D631 Anemia in chronic kidney disease: Secondary | ICD-10-CM | POA: Diagnosis not present

## 2018-07-30 DIAGNOSIS — N2581 Secondary hyperparathyroidism of renal origin: Secondary | ICD-10-CM | POA: Diagnosis not present

## 2018-07-30 DIAGNOSIS — D259 Leiomyoma of uterus, unspecified: Secondary | ICD-10-CM | POA: Diagnosis not present

## 2018-08-02 DIAGNOSIS — N2581 Secondary hyperparathyroidism of renal origin: Secondary | ICD-10-CM | POA: Diagnosis not present

## 2018-08-02 DIAGNOSIS — D259 Leiomyoma of uterus, unspecified: Secondary | ICD-10-CM | POA: Diagnosis not present

## 2018-08-02 DIAGNOSIS — H40033 Anatomical narrow angle, bilateral: Secondary | ICD-10-CM | POA: Diagnosis not present

## 2018-08-02 DIAGNOSIS — N186 End stage renal disease: Secondary | ICD-10-CM | POA: Diagnosis not present

## 2018-08-02 DIAGNOSIS — D631 Anemia in chronic kidney disease: Secondary | ICD-10-CM | POA: Diagnosis not present

## 2018-08-02 DIAGNOSIS — H2513 Age-related nuclear cataract, bilateral: Secondary | ICD-10-CM | POA: Diagnosis not present

## 2018-08-06 DIAGNOSIS — N186 End stage renal disease: Secondary | ICD-10-CM | POA: Diagnosis not present

## 2018-08-06 DIAGNOSIS — D631 Anemia in chronic kidney disease: Secondary | ICD-10-CM | POA: Diagnosis not present

## 2018-08-06 DIAGNOSIS — D259 Leiomyoma of uterus, unspecified: Secondary | ICD-10-CM | POA: Diagnosis not present

## 2018-08-06 DIAGNOSIS — N2581 Secondary hyperparathyroidism of renal origin: Secondary | ICD-10-CM | POA: Diagnosis not present

## 2018-08-09 DIAGNOSIS — D631 Anemia in chronic kidney disease: Secondary | ICD-10-CM | POA: Diagnosis not present

## 2018-08-09 DIAGNOSIS — N186 End stage renal disease: Secondary | ICD-10-CM | POA: Diagnosis not present

## 2018-08-09 DIAGNOSIS — N2581 Secondary hyperparathyroidism of renal origin: Secondary | ICD-10-CM | POA: Diagnosis not present

## 2018-08-09 DIAGNOSIS — D259 Leiomyoma of uterus, unspecified: Secondary | ICD-10-CM | POA: Diagnosis not present

## 2018-08-11 DIAGNOSIS — D631 Anemia in chronic kidney disease: Secondary | ICD-10-CM | POA: Diagnosis not present

## 2018-08-11 DIAGNOSIS — N186 End stage renal disease: Secondary | ICD-10-CM | POA: Diagnosis not present

## 2018-08-11 DIAGNOSIS — N2581 Secondary hyperparathyroidism of renal origin: Secondary | ICD-10-CM | POA: Diagnosis not present

## 2018-08-11 DIAGNOSIS — D259 Leiomyoma of uterus, unspecified: Secondary | ICD-10-CM | POA: Diagnosis not present

## 2018-08-13 DIAGNOSIS — N2581 Secondary hyperparathyroidism of renal origin: Secondary | ICD-10-CM | POA: Diagnosis not present

## 2018-08-13 DIAGNOSIS — D631 Anemia in chronic kidney disease: Secondary | ICD-10-CM | POA: Diagnosis not present

## 2018-08-13 DIAGNOSIS — N186 End stage renal disease: Secondary | ICD-10-CM | POA: Diagnosis not present

## 2018-08-13 DIAGNOSIS — D259 Leiomyoma of uterus, unspecified: Secondary | ICD-10-CM | POA: Diagnosis not present

## 2018-08-15 DIAGNOSIS — D631 Anemia in chronic kidney disease: Secondary | ICD-10-CM | POA: Diagnosis not present

## 2018-08-15 DIAGNOSIS — N2581 Secondary hyperparathyroidism of renal origin: Secondary | ICD-10-CM | POA: Diagnosis not present

## 2018-08-15 DIAGNOSIS — D259 Leiomyoma of uterus, unspecified: Secondary | ICD-10-CM | POA: Diagnosis not present

## 2018-08-15 DIAGNOSIS — N186 End stage renal disease: Secondary | ICD-10-CM | POA: Diagnosis not present

## 2018-08-17 DIAGNOSIS — D259 Leiomyoma of uterus, unspecified: Secondary | ICD-10-CM | POA: Diagnosis not present

## 2018-08-17 DIAGNOSIS — D631 Anemia in chronic kidney disease: Secondary | ICD-10-CM | POA: Diagnosis not present

## 2018-08-17 DIAGNOSIS — N2581 Secondary hyperparathyroidism of renal origin: Secondary | ICD-10-CM | POA: Diagnosis not present

## 2018-08-17 DIAGNOSIS — N186 End stage renal disease: Secondary | ICD-10-CM | POA: Diagnosis not present

## 2018-08-20 DIAGNOSIS — D259 Leiomyoma of uterus, unspecified: Secondary | ICD-10-CM | POA: Diagnosis not present

## 2018-08-20 DIAGNOSIS — N186 End stage renal disease: Secondary | ICD-10-CM | POA: Diagnosis not present

## 2018-08-20 DIAGNOSIS — N2581 Secondary hyperparathyroidism of renal origin: Secondary | ICD-10-CM | POA: Diagnosis not present

## 2018-08-20 DIAGNOSIS — D631 Anemia in chronic kidney disease: Secondary | ICD-10-CM | POA: Diagnosis not present

## 2018-08-21 DIAGNOSIS — Q612 Polycystic kidney, adult type: Secondary | ICD-10-CM | POA: Diagnosis not present

## 2018-08-21 DIAGNOSIS — N186 End stage renal disease: Secondary | ICD-10-CM | POA: Diagnosis not present

## 2018-08-21 DIAGNOSIS — Z992 Dependence on renal dialysis: Secondary | ICD-10-CM | POA: Diagnosis not present

## 2018-08-23 DIAGNOSIS — N2581 Secondary hyperparathyroidism of renal origin: Secondary | ICD-10-CM | POA: Diagnosis not present

## 2018-08-23 DIAGNOSIS — N186 End stage renal disease: Secondary | ICD-10-CM | POA: Diagnosis not present

## 2018-08-23 DIAGNOSIS — D259 Leiomyoma of uterus, unspecified: Secondary | ICD-10-CM | POA: Diagnosis not present

## 2018-08-25 DIAGNOSIS — N2581 Secondary hyperparathyroidism of renal origin: Secondary | ICD-10-CM | POA: Diagnosis not present

## 2018-08-25 DIAGNOSIS — D259 Leiomyoma of uterus, unspecified: Secondary | ICD-10-CM | POA: Diagnosis not present

## 2018-08-25 DIAGNOSIS — N186 End stage renal disease: Secondary | ICD-10-CM | POA: Diagnosis not present

## 2018-08-27 DIAGNOSIS — D259 Leiomyoma of uterus, unspecified: Secondary | ICD-10-CM | POA: Diagnosis not present

## 2018-08-27 DIAGNOSIS — N2581 Secondary hyperparathyroidism of renal origin: Secondary | ICD-10-CM | POA: Diagnosis not present

## 2018-08-27 DIAGNOSIS — N186 End stage renal disease: Secondary | ICD-10-CM | POA: Diagnosis not present

## 2018-08-30 DIAGNOSIS — D259 Leiomyoma of uterus, unspecified: Secondary | ICD-10-CM | POA: Diagnosis not present

## 2018-08-30 DIAGNOSIS — N186 End stage renal disease: Secondary | ICD-10-CM | POA: Diagnosis not present

## 2018-08-30 DIAGNOSIS — N2581 Secondary hyperparathyroidism of renal origin: Secondary | ICD-10-CM | POA: Diagnosis not present

## 2018-09-03 DIAGNOSIS — N186 End stage renal disease: Secondary | ICD-10-CM | POA: Diagnosis not present

## 2018-09-03 DIAGNOSIS — D259 Leiomyoma of uterus, unspecified: Secondary | ICD-10-CM | POA: Diagnosis not present

## 2018-09-03 DIAGNOSIS — N2581 Secondary hyperparathyroidism of renal origin: Secondary | ICD-10-CM | POA: Diagnosis not present

## 2018-09-06 DIAGNOSIS — D259 Leiomyoma of uterus, unspecified: Secondary | ICD-10-CM | POA: Diagnosis not present

## 2018-09-06 DIAGNOSIS — N2581 Secondary hyperparathyroidism of renal origin: Secondary | ICD-10-CM | POA: Diagnosis not present

## 2018-09-06 DIAGNOSIS — N186 End stage renal disease: Secondary | ICD-10-CM | POA: Diagnosis not present

## 2018-09-10 DIAGNOSIS — N2581 Secondary hyperparathyroidism of renal origin: Secondary | ICD-10-CM | POA: Diagnosis not present

## 2018-09-10 DIAGNOSIS — N186 End stage renal disease: Secondary | ICD-10-CM | POA: Diagnosis not present

## 2018-09-10 DIAGNOSIS — D259 Leiomyoma of uterus, unspecified: Secondary | ICD-10-CM | POA: Diagnosis not present

## 2018-09-12 DIAGNOSIS — N186 End stage renal disease: Secondary | ICD-10-CM | POA: Diagnosis not present

## 2018-09-12 DIAGNOSIS — N2581 Secondary hyperparathyroidism of renal origin: Secondary | ICD-10-CM | POA: Diagnosis not present

## 2018-09-12 DIAGNOSIS — D259 Leiomyoma of uterus, unspecified: Secondary | ICD-10-CM | POA: Diagnosis not present

## 2018-09-15 DIAGNOSIS — N186 End stage renal disease: Secondary | ICD-10-CM | POA: Diagnosis not present

## 2018-09-15 DIAGNOSIS — N2581 Secondary hyperparathyroidism of renal origin: Secondary | ICD-10-CM | POA: Diagnosis not present

## 2018-09-15 DIAGNOSIS — D259 Leiomyoma of uterus, unspecified: Secondary | ICD-10-CM | POA: Diagnosis not present

## 2018-09-17 DIAGNOSIS — N186 End stage renal disease: Secondary | ICD-10-CM | POA: Diagnosis not present

## 2018-09-17 DIAGNOSIS — D259 Leiomyoma of uterus, unspecified: Secondary | ICD-10-CM | POA: Diagnosis not present

## 2018-09-17 DIAGNOSIS — N2581 Secondary hyperparathyroidism of renal origin: Secondary | ICD-10-CM | POA: Diagnosis not present

## 2018-09-19 DIAGNOSIS — D259 Leiomyoma of uterus, unspecified: Secondary | ICD-10-CM | POA: Diagnosis not present

## 2018-09-19 DIAGNOSIS — N2581 Secondary hyperparathyroidism of renal origin: Secondary | ICD-10-CM | POA: Diagnosis not present

## 2018-09-19 DIAGNOSIS — N186 End stage renal disease: Secondary | ICD-10-CM | POA: Diagnosis not present

## 2018-09-21 DIAGNOSIS — N186 End stage renal disease: Secondary | ICD-10-CM | POA: Diagnosis not present

## 2018-09-21 DIAGNOSIS — Q612 Polycystic kidney, adult type: Secondary | ICD-10-CM | POA: Diagnosis not present

## 2018-09-21 DIAGNOSIS — Z992 Dependence on renal dialysis: Secondary | ICD-10-CM | POA: Diagnosis not present

## 2018-09-22 DIAGNOSIS — D259 Leiomyoma of uterus, unspecified: Secondary | ICD-10-CM | POA: Diagnosis not present

## 2018-09-22 DIAGNOSIS — N2581 Secondary hyperparathyroidism of renal origin: Secondary | ICD-10-CM | POA: Diagnosis not present

## 2018-09-22 DIAGNOSIS — N186 End stage renal disease: Secondary | ICD-10-CM | POA: Diagnosis not present

## 2018-09-24 DIAGNOSIS — N2581 Secondary hyperparathyroidism of renal origin: Secondary | ICD-10-CM | POA: Diagnosis not present

## 2018-09-24 DIAGNOSIS — D259 Leiomyoma of uterus, unspecified: Secondary | ICD-10-CM | POA: Diagnosis not present

## 2018-09-24 DIAGNOSIS — N186 End stage renal disease: Secondary | ICD-10-CM | POA: Diagnosis not present

## 2018-09-26 DIAGNOSIS — I871 Compression of vein: Secondary | ICD-10-CM | POA: Diagnosis not present

## 2018-09-26 DIAGNOSIS — N186 End stage renal disease: Secondary | ICD-10-CM | POA: Diagnosis not present

## 2018-09-26 DIAGNOSIS — T82858A Stenosis of vascular prosthetic devices, implants and grafts, initial encounter: Secondary | ICD-10-CM | POA: Diagnosis not present

## 2018-09-26 DIAGNOSIS — Z992 Dependence on renal dialysis: Secondary | ICD-10-CM | POA: Diagnosis not present

## 2018-09-27 DIAGNOSIS — N186 End stage renal disease: Secondary | ICD-10-CM | POA: Diagnosis not present

## 2018-09-27 DIAGNOSIS — N2581 Secondary hyperparathyroidism of renal origin: Secondary | ICD-10-CM | POA: Diagnosis not present

## 2018-09-27 DIAGNOSIS — D259 Leiomyoma of uterus, unspecified: Secondary | ICD-10-CM | POA: Diagnosis not present

## 2018-09-28 DIAGNOSIS — H2513 Age-related nuclear cataract, bilateral: Secondary | ICD-10-CM | POA: Diagnosis not present

## 2018-09-29 DIAGNOSIS — N2581 Secondary hyperparathyroidism of renal origin: Secondary | ICD-10-CM | POA: Diagnosis not present

## 2018-09-29 DIAGNOSIS — D259 Leiomyoma of uterus, unspecified: Secondary | ICD-10-CM | POA: Diagnosis not present

## 2018-09-29 DIAGNOSIS — N186 End stage renal disease: Secondary | ICD-10-CM | POA: Diagnosis not present

## 2018-10-01 DIAGNOSIS — N186 End stage renal disease: Secondary | ICD-10-CM | POA: Diagnosis not present

## 2018-10-01 DIAGNOSIS — D259 Leiomyoma of uterus, unspecified: Secondary | ICD-10-CM | POA: Diagnosis not present

## 2018-10-01 DIAGNOSIS — N2581 Secondary hyperparathyroidism of renal origin: Secondary | ICD-10-CM | POA: Diagnosis not present

## 2018-10-04 DIAGNOSIS — N186 End stage renal disease: Secondary | ICD-10-CM | POA: Diagnosis not present

## 2018-10-04 DIAGNOSIS — N2581 Secondary hyperparathyroidism of renal origin: Secondary | ICD-10-CM | POA: Diagnosis not present

## 2018-10-04 DIAGNOSIS — D259 Leiomyoma of uterus, unspecified: Secondary | ICD-10-CM | POA: Diagnosis not present

## 2018-10-06 DIAGNOSIS — D259 Leiomyoma of uterus, unspecified: Secondary | ICD-10-CM | POA: Diagnosis not present

## 2018-10-06 DIAGNOSIS — N186 End stage renal disease: Secondary | ICD-10-CM | POA: Diagnosis not present

## 2018-10-06 DIAGNOSIS — N2581 Secondary hyperparathyroidism of renal origin: Secondary | ICD-10-CM | POA: Diagnosis not present

## 2018-10-08 DIAGNOSIS — N186 End stage renal disease: Secondary | ICD-10-CM | POA: Diagnosis not present

## 2018-10-08 DIAGNOSIS — N2581 Secondary hyperparathyroidism of renal origin: Secondary | ICD-10-CM | POA: Diagnosis not present

## 2018-10-08 DIAGNOSIS — D259 Leiomyoma of uterus, unspecified: Secondary | ICD-10-CM | POA: Diagnosis not present

## 2018-10-11 DIAGNOSIS — N186 End stage renal disease: Secondary | ICD-10-CM | POA: Diagnosis not present

## 2018-10-11 DIAGNOSIS — N2581 Secondary hyperparathyroidism of renal origin: Secondary | ICD-10-CM | POA: Diagnosis not present

## 2018-10-11 DIAGNOSIS — D259 Leiomyoma of uterus, unspecified: Secondary | ICD-10-CM | POA: Diagnosis not present

## 2018-10-13 DIAGNOSIS — N186 End stage renal disease: Secondary | ICD-10-CM | POA: Diagnosis not present

## 2018-10-13 DIAGNOSIS — D259 Leiomyoma of uterus, unspecified: Secondary | ICD-10-CM | POA: Diagnosis not present

## 2018-10-13 DIAGNOSIS — N2581 Secondary hyperparathyroidism of renal origin: Secondary | ICD-10-CM | POA: Diagnosis not present

## 2018-10-15 DIAGNOSIS — N186 End stage renal disease: Secondary | ICD-10-CM | POA: Diagnosis not present

## 2018-10-15 DIAGNOSIS — N2581 Secondary hyperparathyroidism of renal origin: Secondary | ICD-10-CM | POA: Diagnosis not present

## 2018-10-15 DIAGNOSIS — D259 Leiomyoma of uterus, unspecified: Secondary | ICD-10-CM | POA: Diagnosis not present

## 2018-10-18 DIAGNOSIS — N186 End stage renal disease: Secondary | ICD-10-CM | POA: Diagnosis not present

## 2018-10-18 DIAGNOSIS — N2581 Secondary hyperparathyroidism of renal origin: Secondary | ICD-10-CM | POA: Diagnosis not present

## 2018-10-18 DIAGNOSIS — D259 Leiomyoma of uterus, unspecified: Secondary | ICD-10-CM | POA: Diagnosis not present

## 2018-10-20 DIAGNOSIS — D259 Leiomyoma of uterus, unspecified: Secondary | ICD-10-CM | POA: Diagnosis not present

## 2018-10-20 DIAGNOSIS — N2581 Secondary hyperparathyroidism of renal origin: Secondary | ICD-10-CM | POA: Diagnosis not present

## 2018-10-20 DIAGNOSIS — N186 End stage renal disease: Secondary | ICD-10-CM | POA: Diagnosis not present

## 2018-10-22 DIAGNOSIS — N2581 Secondary hyperparathyroidism of renal origin: Secondary | ICD-10-CM | POA: Diagnosis not present

## 2018-10-22 DIAGNOSIS — Z992 Dependence on renal dialysis: Secondary | ICD-10-CM | POA: Diagnosis not present

## 2018-10-22 DIAGNOSIS — Q612 Polycystic kidney, adult type: Secondary | ICD-10-CM | POA: Diagnosis not present

## 2018-10-22 DIAGNOSIS — D259 Leiomyoma of uterus, unspecified: Secondary | ICD-10-CM | POA: Diagnosis not present

## 2018-10-22 DIAGNOSIS — N186 End stage renal disease: Secondary | ICD-10-CM | POA: Diagnosis not present

## 2018-10-24 DIAGNOSIS — H2513 Age-related nuclear cataract, bilateral: Secondary | ICD-10-CM | POA: Diagnosis not present

## 2018-10-25 DIAGNOSIS — N2581 Secondary hyperparathyroidism of renal origin: Secondary | ICD-10-CM | POA: Diagnosis not present

## 2018-10-25 DIAGNOSIS — N186 End stage renal disease: Secondary | ICD-10-CM | POA: Diagnosis not present

## 2018-10-25 DIAGNOSIS — D259 Leiomyoma of uterus, unspecified: Secondary | ICD-10-CM | POA: Diagnosis not present

## 2018-10-26 ENCOUNTER — Encounter: Payer: Self-pay | Admitting: Podiatry

## 2018-10-26 ENCOUNTER — Ambulatory Visit (INDEPENDENT_AMBULATORY_CARE_PROVIDER_SITE_OTHER): Payer: Medicare Other | Admitting: Podiatry

## 2018-10-26 DIAGNOSIS — B351 Tinea unguium: Secondary | ICD-10-CM | POA: Diagnosis not present

## 2018-10-26 DIAGNOSIS — M79675 Pain in left toe(s): Secondary | ICD-10-CM | POA: Diagnosis not present

## 2018-10-26 DIAGNOSIS — M79676 Pain in unspecified toe(s): Secondary | ICD-10-CM

## 2018-10-26 DIAGNOSIS — M79674 Pain in right toe(s): Secondary | ICD-10-CM | POA: Diagnosis not present

## 2018-10-26 NOTE — Progress Notes (Signed)
Complaint:  Visit Type: Patient returns to my office for continued preventative foot care services. Complaint: Patient states" my nails have grown long and thick and become painful to walk and wear shoes" . The patient presents for preventative foot care services. No changes to ROS  Podiatric Exam: Vascular: dorsalis pedis and posterior tibial pulses are palpable bilateral. Capillary return is immediate. Temperature gradient is WNL. Skin turgor WNL  Sensorium: Normal Semmes Weinstein monofilament test. Normal tactile sensation bilaterally. Nail Exam: Pt has thick disfigured discolored nails with subungual debris noted bilateral entire nail hallux through fifth toenails Ulcer Exam: There is no evidence of ulcer or pre-ulcerative changes or infection. Orthopedic Exam: Muscle tone and strength are WNL. No limitations in general ROM. No crepitus or effusions noted. Foot type and digits show no abnormalities. Bony prominences are unremarkable. Skin: No Porokeratosis. No infection or ulcers  Diagnosis:  Onychomycosis, , Pain in right toe, pain in left toes  Treatment & Plan Procedures and Treatment: Consent by patient was obtained for treatment procedures. The patient understood the discussion of treatment and procedures well. All questions were answered thoroughly reviewed. Debridement of mycotic and hypertrophic toenails, 1 through 5 bilateral and clearing of subungual debris. No ulceration, no infection noted.  Return Visit-Office Procedure: Patient instructed to return to the office for a follow up visit 3 months   for continued evaluation and treatment.    Hadas Jessop DPM 

## 2018-10-29 DIAGNOSIS — N186 End stage renal disease: Secondary | ICD-10-CM | POA: Diagnosis not present

## 2018-10-29 DIAGNOSIS — D259 Leiomyoma of uterus, unspecified: Secondary | ICD-10-CM | POA: Diagnosis not present

## 2018-10-29 DIAGNOSIS — N2581 Secondary hyperparathyroidism of renal origin: Secondary | ICD-10-CM | POA: Diagnosis not present

## 2018-10-29 DIAGNOSIS — R197 Diarrhea, unspecified: Secondary | ICD-10-CM | POA: Insufficient documentation

## 2018-11-01 DIAGNOSIS — N2581 Secondary hyperparathyroidism of renal origin: Secondary | ICD-10-CM | POA: Diagnosis not present

## 2018-11-01 DIAGNOSIS — D259 Leiomyoma of uterus, unspecified: Secondary | ICD-10-CM | POA: Diagnosis not present

## 2018-11-01 DIAGNOSIS — N186 End stage renal disease: Secondary | ICD-10-CM | POA: Diagnosis not present

## 2018-11-03 DIAGNOSIS — N2581 Secondary hyperparathyroidism of renal origin: Secondary | ICD-10-CM | POA: Diagnosis not present

## 2018-11-03 DIAGNOSIS — N186 End stage renal disease: Secondary | ICD-10-CM | POA: Diagnosis not present

## 2018-11-03 DIAGNOSIS — D259 Leiomyoma of uterus, unspecified: Secondary | ICD-10-CM | POA: Diagnosis not present

## 2018-11-05 DIAGNOSIS — D259 Leiomyoma of uterus, unspecified: Secondary | ICD-10-CM | POA: Diagnosis not present

## 2018-11-05 DIAGNOSIS — N186 End stage renal disease: Secondary | ICD-10-CM | POA: Diagnosis not present

## 2018-11-05 DIAGNOSIS — N2581 Secondary hyperparathyroidism of renal origin: Secondary | ICD-10-CM | POA: Diagnosis not present

## 2018-11-08 DIAGNOSIS — N2581 Secondary hyperparathyroidism of renal origin: Secondary | ICD-10-CM | POA: Diagnosis not present

## 2018-11-08 DIAGNOSIS — N186 End stage renal disease: Secondary | ICD-10-CM | POA: Diagnosis not present

## 2018-11-08 DIAGNOSIS — D259 Leiomyoma of uterus, unspecified: Secondary | ICD-10-CM | POA: Diagnosis not present

## 2018-11-10 DIAGNOSIS — N2581 Secondary hyperparathyroidism of renal origin: Secondary | ICD-10-CM | POA: Diagnosis not present

## 2018-11-10 DIAGNOSIS — N186 End stage renal disease: Secondary | ICD-10-CM | POA: Diagnosis not present

## 2018-11-10 DIAGNOSIS — D259 Leiomyoma of uterus, unspecified: Secondary | ICD-10-CM | POA: Diagnosis not present

## 2018-11-12 DIAGNOSIS — N186 End stage renal disease: Secondary | ICD-10-CM | POA: Diagnosis not present

## 2018-11-12 DIAGNOSIS — N2581 Secondary hyperparathyroidism of renal origin: Secondary | ICD-10-CM | POA: Diagnosis not present

## 2018-11-12 DIAGNOSIS — D259 Leiomyoma of uterus, unspecified: Secondary | ICD-10-CM | POA: Diagnosis not present

## 2018-11-15 DIAGNOSIS — N186 End stage renal disease: Secondary | ICD-10-CM | POA: Diagnosis not present

## 2018-11-15 DIAGNOSIS — D259 Leiomyoma of uterus, unspecified: Secondary | ICD-10-CM | POA: Diagnosis not present

## 2018-11-15 DIAGNOSIS — N2581 Secondary hyperparathyroidism of renal origin: Secondary | ICD-10-CM | POA: Diagnosis not present

## 2018-11-19 DIAGNOSIS — D259 Leiomyoma of uterus, unspecified: Secondary | ICD-10-CM | POA: Diagnosis not present

## 2018-11-19 DIAGNOSIS — N186 End stage renal disease: Secondary | ICD-10-CM | POA: Diagnosis not present

## 2018-11-19 DIAGNOSIS — N2581 Secondary hyperparathyroidism of renal origin: Secondary | ICD-10-CM | POA: Diagnosis not present

## 2018-11-20 DIAGNOSIS — Q612 Polycystic kidney, adult type: Secondary | ICD-10-CM | POA: Diagnosis not present

## 2018-11-20 DIAGNOSIS — Z992 Dependence on renal dialysis: Secondary | ICD-10-CM | POA: Diagnosis not present

## 2018-11-20 DIAGNOSIS — N186 End stage renal disease: Secondary | ICD-10-CM | POA: Diagnosis not present

## 2018-11-22 DIAGNOSIS — N2581 Secondary hyperparathyroidism of renal origin: Secondary | ICD-10-CM | POA: Diagnosis not present

## 2018-11-22 DIAGNOSIS — D259 Leiomyoma of uterus, unspecified: Secondary | ICD-10-CM | POA: Diagnosis not present

## 2018-11-22 DIAGNOSIS — N186 End stage renal disease: Secondary | ICD-10-CM | POA: Diagnosis not present

## 2018-11-24 DIAGNOSIS — N2581 Secondary hyperparathyroidism of renal origin: Secondary | ICD-10-CM | POA: Diagnosis not present

## 2018-11-24 DIAGNOSIS — D259 Leiomyoma of uterus, unspecified: Secondary | ICD-10-CM | POA: Diagnosis not present

## 2018-11-24 DIAGNOSIS — N186 End stage renal disease: Secondary | ICD-10-CM | POA: Diagnosis not present

## 2018-11-26 DIAGNOSIS — N186 End stage renal disease: Secondary | ICD-10-CM | POA: Diagnosis not present

## 2018-11-26 DIAGNOSIS — N2581 Secondary hyperparathyroidism of renal origin: Secondary | ICD-10-CM | POA: Diagnosis not present

## 2018-11-26 DIAGNOSIS — D259 Leiomyoma of uterus, unspecified: Secondary | ICD-10-CM | POA: Diagnosis not present

## 2018-11-29 DIAGNOSIS — N186 End stage renal disease: Secondary | ICD-10-CM | POA: Diagnosis not present

## 2018-11-29 DIAGNOSIS — D259 Leiomyoma of uterus, unspecified: Secondary | ICD-10-CM | POA: Diagnosis not present

## 2018-11-29 DIAGNOSIS — N2581 Secondary hyperparathyroidism of renal origin: Secondary | ICD-10-CM | POA: Diagnosis not present

## 2018-12-01 DIAGNOSIS — N2581 Secondary hyperparathyroidism of renal origin: Secondary | ICD-10-CM | POA: Diagnosis not present

## 2018-12-01 DIAGNOSIS — D259 Leiomyoma of uterus, unspecified: Secondary | ICD-10-CM | POA: Diagnosis not present

## 2018-12-01 DIAGNOSIS — N186 End stage renal disease: Secondary | ICD-10-CM | POA: Diagnosis not present

## 2018-12-03 DIAGNOSIS — D259 Leiomyoma of uterus, unspecified: Secondary | ICD-10-CM | POA: Diagnosis not present

## 2018-12-03 DIAGNOSIS — N186 End stage renal disease: Secondary | ICD-10-CM | POA: Diagnosis not present

## 2018-12-03 DIAGNOSIS — N2581 Secondary hyperparathyroidism of renal origin: Secondary | ICD-10-CM | POA: Diagnosis not present

## 2018-12-06 DIAGNOSIS — N2581 Secondary hyperparathyroidism of renal origin: Secondary | ICD-10-CM | POA: Diagnosis not present

## 2018-12-06 DIAGNOSIS — D259 Leiomyoma of uterus, unspecified: Secondary | ICD-10-CM | POA: Diagnosis not present

## 2018-12-06 DIAGNOSIS — N186 End stage renal disease: Secondary | ICD-10-CM | POA: Diagnosis not present

## 2018-12-08 DIAGNOSIS — N2581 Secondary hyperparathyroidism of renal origin: Secondary | ICD-10-CM | POA: Diagnosis not present

## 2018-12-08 DIAGNOSIS — N186 End stage renal disease: Secondary | ICD-10-CM | POA: Diagnosis not present

## 2018-12-08 DIAGNOSIS — D259 Leiomyoma of uterus, unspecified: Secondary | ICD-10-CM | POA: Diagnosis not present

## 2018-12-10 DIAGNOSIS — N186 End stage renal disease: Secondary | ICD-10-CM | POA: Diagnosis not present

## 2018-12-10 DIAGNOSIS — N2581 Secondary hyperparathyroidism of renal origin: Secondary | ICD-10-CM | POA: Diagnosis not present

## 2018-12-10 DIAGNOSIS — D259 Leiomyoma of uterus, unspecified: Secondary | ICD-10-CM | POA: Diagnosis not present

## 2018-12-13 DIAGNOSIS — N186 End stage renal disease: Secondary | ICD-10-CM | POA: Diagnosis not present

## 2018-12-13 DIAGNOSIS — N2581 Secondary hyperparathyroidism of renal origin: Secondary | ICD-10-CM | POA: Diagnosis not present

## 2018-12-13 DIAGNOSIS — D259 Leiomyoma of uterus, unspecified: Secondary | ICD-10-CM | POA: Diagnosis not present

## 2018-12-15 DIAGNOSIS — N186 End stage renal disease: Secondary | ICD-10-CM | POA: Diagnosis not present

## 2018-12-15 DIAGNOSIS — N2581 Secondary hyperparathyroidism of renal origin: Secondary | ICD-10-CM | POA: Diagnosis not present

## 2018-12-15 DIAGNOSIS — D259 Leiomyoma of uterus, unspecified: Secondary | ICD-10-CM | POA: Diagnosis not present

## 2018-12-17 DIAGNOSIS — N2581 Secondary hyperparathyroidism of renal origin: Secondary | ICD-10-CM | POA: Diagnosis not present

## 2018-12-17 DIAGNOSIS — N186 End stage renal disease: Secondary | ICD-10-CM | POA: Diagnosis not present

## 2018-12-17 DIAGNOSIS — D259 Leiomyoma of uterus, unspecified: Secondary | ICD-10-CM | POA: Diagnosis not present

## 2018-12-20 DIAGNOSIS — N186 End stage renal disease: Secondary | ICD-10-CM | POA: Diagnosis not present

## 2018-12-20 DIAGNOSIS — D259 Leiomyoma of uterus, unspecified: Secondary | ICD-10-CM | POA: Diagnosis not present

## 2018-12-20 DIAGNOSIS — N2581 Secondary hyperparathyroidism of renal origin: Secondary | ICD-10-CM | POA: Diagnosis not present

## 2018-12-21 DIAGNOSIS — N186 End stage renal disease: Secondary | ICD-10-CM | POA: Diagnosis not present

## 2018-12-21 DIAGNOSIS — Z992 Dependence on renal dialysis: Secondary | ICD-10-CM | POA: Diagnosis not present

## 2018-12-21 DIAGNOSIS — Q612 Polycystic kidney, adult type: Secondary | ICD-10-CM | POA: Diagnosis not present

## 2018-12-22 DIAGNOSIS — N186 End stage renal disease: Secondary | ICD-10-CM | POA: Diagnosis not present

## 2018-12-22 DIAGNOSIS — N2581 Secondary hyperparathyroidism of renal origin: Secondary | ICD-10-CM | POA: Diagnosis not present

## 2018-12-22 DIAGNOSIS — D259 Leiomyoma of uterus, unspecified: Secondary | ICD-10-CM | POA: Diagnosis not present

## 2018-12-24 DIAGNOSIS — D259 Leiomyoma of uterus, unspecified: Secondary | ICD-10-CM | POA: Diagnosis not present

## 2018-12-24 DIAGNOSIS — N2581 Secondary hyperparathyroidism of renal origin: Secondary | ICD-10-CM | POA: Diagnosis not present

## 2018-12-24 DIAGNOSIS — N186 End stage renal disease: Secondary | ICD-10-CM | POA: Diagnosis not present

## 2018-12-27 DIAGNOSIS — N186 End stage renal disease: Secondary | ICD-10-CM | POA: Diagnosis not present

## 2018-12-27 DIAGNOSIS — D259 Leiomyoma of uterus, unspecified: Secondary | ICD-10-CM | POA: Diagnosis not present

## 2018-12-27 DIAGNOSIS — N2581 Secondary hyperparathyroidism of renal origin: Secondary | ICD-10-CM | POA: Diagnosis not present

## 2018-12-29 DIAGNOSIS — N186 End stage renal disease: Secondary | ICD-10-CM | POA: Diagnosis not present

## 2018-12-29 DIAGNOSIS — D259 Leiomyoma of uterus, unspecified: Secondary | ICD-10-CM | POA: Diagnosis not present

## 2018-12-29 DIAGNOSIS — N2581 Secondary hyperparathyroidism of renal origin: Secondary | ICD-10-CM | POA: Diagnosis not present

## 2018-12-31 DIAGNOSIS — D259 Leiomyoma of uterus, unspecified: Secondary | ICD-10-CM | POA: Diagnosis not present

## 2018-12-31 DIAGNOSIS — N2581 Secondary hyperparathyroidism of renal origin: Secondary | ICD-10-CM | POA: Diagnosis not present

## 2018-12-31 DIAGNOSIS — N186 End stage renal disease: Secondary | ICD-10-CM | POA: Diagnosis not present

## 2019-01-04 DIAGNOSIS — I871 Compression of vein: Secondary | ICD-10-CM | POA: Diagnosis not present

## 2019-01-04 DIAGNOSIS — D259 Leiomyoma of uterus, unspecified: Secondary | ICD-10-CM | POA: Diagnosis not present

## 2019-01-04 DIAGNOSIS — T82868A Thrombosis of vascular prosthetic devices, implants and grafts, initial encounter: Secondary | ICD-10-CM | POA: Diagnosis not present

## 2019-01-04 DIAGNOSIS — N2581 Secondary hyperparathyroidism of renal origin: Secondary | ICD-10-CM | POA: Diagnosis not present

## 2019-01-04 DIAGNOSIS — Z992 Dependence on renal dialysis: Secondary | ICD-10-CM | POA: Diagnosis not present

## 2019-01-04 DIAGNOSIS — N186 End stage renal disease: Secondary | ICD-10-CM | POA: Diagnosis not present

## 2019-01-04 DIAGNOSIS — R52 Pain, unspecified: Secondary | ICD-10-CM | POA: Insufficient documentation

## 2019-01-07 DIAGNOSIS — D259 Leiomyoma of uterus, unspecified: Secondary | ICD-10-CM | POA: Diagnosis not present

## 2019-01-07 DIAGNOSIS — N186 End stage renal disease: Secondary | ICD-10-CM | POA: Diagnosis not present

## 2019-01-07 DIAGNOSIS — N2581 Secondary hyperparathyroidism of renal origin: Secondary | ICD-10-CM | POA: Diagnosis not present

## 2019-01-10 DIAGNOSIS — N186 End stage renal disease: Secondary | ICD-10-CM | POA: Diagnosis not present

## 2019-01-10 DIAGNOSIS — D259 Leiomyoma of uterus, unspecified: Secondary | ICD-10-CM | POA: Diagnosis not present

## 2019-01-10 DIAGNOSIS — N2581 Secondary hyperparathyroidism of renal origin: Secondary | ICD-10-CM | POA: Diagnosis not present

## 2019-01-12 DIAGNOSIS — N186 End stage renal disease: Secondary | ICD-10-CM | POA: Diagnosis not present

## 2019-01-12 DIAGNOSIS — N2581 Secondary hyperparathyroidism of renal origin: Secondary | ICD-10-CM | POA: Diagnosis not present

## 2019-01-12 DIAGNOSIS — D259 Leiomyoma of uterus, unspecified: Secondary | ICD-10-CM | POA: Diagnosis not present

## 2019-01-14 DIAGNOSIS — N186 End stage renal disease: Secondary | ICD-10-CM | POA: Diagnosis not present

## 2019-01-14 DIAGNOSIS — D259 Leiomyoma of uterus, unspecified: Secondary | ICD-10-CM | POA: Diagnosis not present

## 2019-01-14 DIAGNOSIS — N2581 Secondary hyperparathyroidism of renal origin: Secondary | ICD-10-CM | POA: Diagnosis not present

## 2019-01-17 DIAGNOSIS — N2581 Secondary hyperparathyroidism of renal origin: Secondary | ICD-10-CM | POA: Diagnosis not present

## 2019-01-17 DIAGNOSIS — D259 Leiomyoma of uterus, unspecified: Secondary | ICD-10-CM | POA: Diagnosis not present

## 2019-01-17 DIAGNOSIS — N186 End stage renal disease: Secondary | ICD-10-CM | POA: Diagnosis not present

## 2019-01-19 DIAGNOSIS — N2581 Secondary hyperparathyroidism of renal origin: Secondary | ICD-10-CM | POA: Diagnosis not present

## 2019-01-19 DIAGNOSIS — D259 Leiomyoma of uterus, unspecified: Secondary | ICD-10-CM | POA: Diagnosis not present

## 2019-01-19 DIAGNOSIS — N186 End stage renal disease: Secondary | ICD-10-CM | POA: Diagnosis not present

## 2019-01-20 DIAGNOSIS — Z992 Dependence on renal dialysis: Secondary | ICD-10-CM | POA: Diagnosis not present

## 2019-01-20 DIAGNOSIS — Q612 Polycystic kidney, adult type: Secondary | ICD-10-CM | POA: Diagnosis not present

## 2019-01-20 DIAGNOSIS — N186 End stage renal disease: Secondary | ICD-10-CM | POA: Diagnosis not present

## 2019-01-21 DIAGNOSIS — D259 Leiomyoma of uterus, unspecified: Secondary | ICD-10-CM | POA: Diagnosis not present

## 2019-01-21 DIAGNOSIS — N2581 Secondary hyperparathyroidism of renal origin: Secondary | ICD-10-CM | POA: Diagnosis not present

## 2019-01-21 DIAGNOSIS — N186 End stage renal disease: Secondary | ICD-10-CM | POA: Diagnosis not present

## 2019-01-24 DIAGNOSIS — N2581 Secondary hyperparathyroidism of renal origin: Secondary | ICD-10-CM | POA: Diagnosis not present

## 2019-01-24 DIAGNOSIS — N186 End stage renal disease: Secondary | ICD-10-CM | POA: Diagnosis not present

## 2019-01-24 DIAGNOSIS — D259 Leiomyoma of uterus, unspecified: Secondary | ICD-10-CM | POA: Diagnosis not present

## 2019-01-25 ENCOUNTER — Other Ambulatory Visit: Payer: Self-pay

## 2019-01-25 ENCOUNTER — Encounter: Payer: Self-pay | Admitting: Podiatry

## 2019-01-25 ENCOUNTER — Ambulatory Visit (INDEPENDENT_AMBULATORY_CARE_PROVIDER_SITE_OTHER): Payer: Medicare Other | Admitting: Podiatry

## 2019-01-25 VITALS — Temp 97.7°F

## 2019-01-25 DIAGNOSIS — M79676 Pain in unspecified toe(s): Secondary | ICD-10-CM

## 2019-01-25 DIAGNOSIS — M79674 Pain in right toe(s): Secondary | ICD-10-CM

## 2019-01-25 DIAGNOSIS — M79675 Pain in left toe(s): Secondary | ICD-10-CM | POA: Diagnosis not present

## 2019-01-25 DIAGNOSIS — B351 Tinea unguium: Secondary | ICD-10-CM | POA: Diagnosis not present

## 2019-01-25 NOTE — Progress Notes (Signed)
Complaint:  Visit Type: Patient returns to my office for continued preventative foot care services. Complaint: Patient states" my nails have grown long and thick and become painful to walk and wear shoes" . The patient presents for preventative foot care services. No changes to ROS  Podiatric Exam: Vascular: dorsalis pedis and posterior tibial pulses are palpable bilateral. Capillary return is immediate. Temperature gradient is WNL. Skin turgor WNL  Sensorium: Normal Semmes Weinstein monofilament test. Normal tactile sensation bilaterally. Nail Exam: Pt has thick disfigured discolored nails with subungual debris noted bilateral entire nail hallux through fifth toenails Ulcer Exam: There is no evidence of ulcer or pre-ulcerative changes or infection. Orthopedic Exam: Muscle tone and strength are WNL. No limitations in general ROM. No crepitus or effusions noted. Foot type and digits show no abnormalities. Bony prominences are unremarkable. Skin: No Porokeratosis. No infection or ulcers  Diagnosis:  Onychomycosis, , Pain in right toe, pain in left toes  Treatment & Plan Procedures and Treatment: Consent by patient was obtained for treatment procedures. The patient understood the discussion of treatment and procedures well. All questions were answered thoroughly reviewed. Debridement of mycotic and hypertrophic toenails, 1 through 5 bilateral and clearing of subungual debris. No ulceration, no infection noted.  Return Visit-Office Procedure: Patient instructed to return to the office for a follow up visit 10 weeks  for continued evaluation and treatment.    Pinki Rottman DPM 

## 2019-01-28 DIAGNOSIS — N186 End stage renal disease: Secondary | ICD-10-CM | POA: Diagnosis not present

## 2019-01-28 DIAGNOSIS — D259 Leiomyoma of uterus, unspecified: Secondary | ICD-10-CM | POA: Diagnosis not present

## 2019-01-28 DIAGNOSIS — N2581 Secondary hyperparathyroidism of renal origin: Secondary | ICD-10-CM | POA: Diagnosis not present

## 2019-01-31 DIAGNOSIS — N2581 Secondary hyperparathyroidism of renal origin: Secondary | ICD-10-CM | POA: Diagnosis not present

## 2019-01-31 DIAGNOSIS — D259 Leiomyoma of uterus, unspecified: Secondary | ICD-10-CM | POA: Diagnosis not present

## 2019-01-31 DIAGNOSIS — N186 End stage renal disease: Secondary | ICD-10-CM | POA: Diagnosis not present

## 2019-02-02 DIAGNOSIS — D259 Leiomyoma of uterus, unspecified: Secondary | ICD-10-CM | POA: Diagnosis not present

## 2019-02-02 DIAGNOSIS — N186 End stage renal disease: Secondary | ICD-10-CM | POA: Diagnosis not present

## 2019-02-02 DIAGNOSIS — N2581 Secondary hyperparathyroidism of renal origin: Secondary | ICD-10-CM | POA: Diagnosis not present

## 2019-02-04 DIAGNOSIS — N2581 Secondary hyperparathyroidism of renal origin: Secondary | ICD-10-CM | POA: Diagnosis not present

## 2019-02-04 DIAGNOSIS — N186 End stage renal disease: Secondary | ICD-10-CM | POA: Diagnosis not present

## 2019-02-04 DIAGNOSIS — D259 Leiomyoma of uterus, unspecified: Secondary | ICD-10-CM | POA: Diagnosis not present

## 2019-02-06 DIAGNOSIS — M25561 Pain in right knee: Secondary | ICD-10-CM | POA: Diagnosis not present

## 2019-02-07 DIAGNOSIS — D259 Leiomyoma of uterus, unspecified: Secondary | ICD-10-CM | POA: Diagnosis not present

## 2019-02-07 DIAGNOSIS — N2581 Secondary hyperparathyroidism of renal origin: Secondary | ICD-10-CM | POA: Diagnosis not present

## 2019-02-07 DIAGNOSIS — N186 End stage renal disease: Secondary | ICD-10-CM | POA: Diagnosis not present

## 2019-02-09 DIAGNOSIS — N186 End stage renal disease: Secondary | ICD-10-CM | POA: Diagnosis not present

## 2019-02-09 DIAGNOSIS — D259 Leiomyoma of uterus, unspecified: Secondary | ICD-10-CM | POA: Diagnosis not present

## 2019-02-09 DIAGNOSIS — N2581 Secondary hyperparathyroidism of renal origin: Secondary | ICD-10-CM | POA: Diagnosis not present

## 2019-02-11 DIAGNOSIS — N2581 Secondary hyperparathyroidism of renal origin: Secondary | ICD-10-CM | POA: Diagnosis not present

## 2019-02-11 DIAGNOSIS — N186 End stage renal disease: Secondary | ICD-10-CM | POA: Diagnosis not present

## 2019-02-11 DIAGNOSIS — D259 Leiomyoma of uterus, unspecified: Secondary | ICD-10-CM | POA: Diagnosis not present

## 2019-02-14 DIAGNOSIS — D259 Leiomyoma of uterus, unspecified: Secondary | ICD-10-CM | POA: Diagnosis not present

## 2019-02-14 DIAGNOSIS — N186 End stage renal disease: Secondary | ICD-10-CM | POA: Diagnosis not present

## 2019-02-14 DIAGNOSIS — N2581 Secondary hyperparathyroidism of renal origin: Secondary | ICD-10-CM | POA: Diagnosis not present

## 2019-02-16 DIAGNOSIS — N186 End stage renal disease: Secondary | ICD-10-CM | POA: Diagnosis not present

## 2019-02-16 DIAGNOSIS — N2581 Secondary hyperparathyroidism of renal origin: Secondary | ICD-10-CM | POA: Diagnosis not present

## 2019-02-16 DIAGNOSIS — D259 Leiomyoma of uterus, unspecified: Secondary | ICD-10-CM | POA: Diagnosis not present

## 2019-02-18 DIAGNOSIS — D259 Leiomyoma of uterus, unspecified: Secondary | ICD-10-CM | POA: Diagnosis not present

## 2019-02-18 DIAGNOSIS — N186 End stage renal disease: Secondary | ICD-10-CM | POA: Diagnosis not present

## 2019-02-18 DIAGNOSIS — N2581 Secondary hyperparathyroidism of renal origin: Secondary | ICD-10-CM | POA: Diagnosis not present

## 2019-02-20 DIAGNOSIS — Q612 Polycystic kidney, adult type: Secondary | ICD-10-CM | POA: Diagnosis not present

## 2019-02-20 DIAGNOSIS — Z992 Dependence on renal dialysis: Secondary | ICD-10-CM | POA: Diagnosis not present

## 2019-02-20 DIAGNOSIS — N186 End stage renal disease: Secondary | ICD-10-CM | POA: Diagnosis not present

## 2019-02-21 DIAGNOSIS — N186 End stage renal disease: Secondary | ICD-10-CM | POA: Diagnosis not present

## 2019-02-21 DIAGNOSIS — N2581 Secondary hyperparathyroidism of renal origin: Secondary | ICD-10-CM | POA: Diagnosis not present

## 2019-02-21 DIAGNOSIS — D259 Leiomyoma of uterus, unspecified: Secondary | ICD-10-CM | POA: Diagnosis not present

## 2019-02-23 DIAGNOSIS — N186 End stage renal disease: Secondary | ICD-10-CM | POA: Diagnosis not present

## 2019-02-23 DIAGNOSIS — D259 Leiomyoma of uterus, unspecified: Secondary | ICD-10-CM | POA: Diagnosis not present

## 2019-02-23 DIAGNOSIS — N2581 Secondary hyperparathyroidism of renal origin: Secondary | ICD-10-CM | POA: Diagnosis not present

## 2019-02-25 DIAGNOSIS — N186 End stage renal disease: Secondary | ICD-10-CM | POA: Diagnosis not present

## 2019-02-25 DIAGNOSIS — D259 Leiomyoma of uterus, unspecified: Secondary | ICD-10-CM | POA: Diagnosis not present

## 2019-02-25 DIAGNOSIS — N2581 Secondary hyperparathyroidism of renal origin: Secondary | ICD-10-CM | POA: Diagnosis not present

## 2019-02-28 DIAGNOSIS — D259 Leiomyoma of uterus, unspecified: Secondary | ICD-10-CM | POA: Diagnosis not present

## 2019-02-28 DIAGNOSIS — N2581 Secondary hyperparathyroidism of renal origin: Secondary | ICD-10-CM | POA: Diagnosis not present

## 2019-02-28 DIAGNOSIS — N186 End stage renal disease: Secondary | ICD-10-CM | POA: Diagnosis not present

## 2019-03-01 DIAGNOSIS — H2513 Age-related nuclear cataract, bilateral: Secondary | ICD-10-CM | POA: Diagnosis not present

## 2019-03-02 DIAGNOSIS — N186 End stage renal disease: Secondary | ICD-10-CM | POA: Diagnosis not present

## 2019-03-02 DIAGNOSIS — D259 Leiomyoma of uterus, unspecified: Secondary | ICD-10-CM | POA: Diagnosis not present

## 2019-03-02 DIAGNOSIS — N2581 Secondary hyperparathyroidism of renal origin: Secondary | ICD-10-CM | POA: Diagnosis not present

## 2019-03-04 DIAGNOSIS — N2581 Secondary hyperparathyroidism of renal origin: Secondary | ICD-10-CM | POA: Diagnosis not present

## 2019-03-04 DIAGNOSIS — N186 End stage renal disease: Secondary | ICD-10-CM | POA: Diagnosis not present

## 2019-03-04 DIAGNOSIS — D259 Leiomyoma of uterus, unspecified: Secondary | ICD-10-CM | POA: Diagnosis not present

## 2019-03-07 DIAGNOSIS — N186 End stage renal disease: Secondary | ICD-10-CM | POA: Diagnosis not present

## 2019-03-07 DIAGNOSIS — D259 Leiomyoma of uterus, unspecified: Secondary | ICD-10-CM | POA: Diagnosis not present

## 2019-03-07 DIAGNOSIS — N2581 Secondary hyperparathyroidism of renal origin: Secondary | ICD-10-CM | POA: Diagnosis not present

## 2019-03-10 DIAGNOSIS — T82868A Thrombosis of vascular prosthetic devices, implants and grafts, initial encounter: Secondary | ICD-10-CM | POA: Diagnosis not present

## 2019-03-10 DIAGNOSIS — N186 End stage renal disease: Secondary | ICD-10-CM | POA: Diagnosis not present

## 2019-03-10 DIAGNOSIS — I871 Compression of vein: Secondary | ICD-10-CM | POA: Diagnosis not present

## 2019-03-10 DIAGNOSIS — Z992 Dependence on renal dialysis: Secondary | ICD-10-CM | POA: Diagnosis not present

## 2019-03-11 DIAGNOSIS — D259 Leiomyoma of uterus, unspecified: Secondary | ICD-10-CM | POA: Diagnosis not present

## 2019-03-11 DIAGNOSIS — N186 End stage renal disease: Secondary | ICD-10-CM | POA: Diagnosis not present

## 2019-03-11 DIAGNOSIS — N2581 Secondary hyperparathyroidism of renal origin: Secondary | ICD-10-CM | POA: Diagnosis not present

## 2019-03-14 DIAGNOSIS — D259 Leiomyoma of uterus, unspecified: Secondary | ICD-10-CM | POA: Diagnosis not present

## 2019-03-14 DIAGNOSIS — N2581 Secondary hyperparathyroidism of renal origin: Secondary | ICD-10-CM | POA: Diagnosis not present

## 2019-03-14 DIAGNOSIS — N186 End stage renal disease: Secondary | ICD-10-CM | POA: Diagnosis not present

## 2019-03-16 DIAGNOSIS — D259 Leiomyoma of uterus, unspecified: Secondary | ICD-10-CM | POA: Diagnosis not present

## 2019-03-16 DIAGNOSIS — N2581 Secondary hyperparathyroidism of renal origin: Secondary | ICD-10-CM | POA: Diagnosis not present

## 2019-03-16 DIAGNOSIS — N186 End stage renal disease: Secondary | ICD-10-CM | POA: Diagnosis not present

## 2019-03-18 DIAGNOSIS — N186 End stage renal disease: Secondary | ICD-10-CM | POA: Diagnosis not present

## 2019-03-18 DIAGNOSIS — D259 Leiomyoma of uterus, unspecified: Secondary | ICD-10-CM | POA: Diagnosis not present

## 2019-03-18 DIAGNOSIS — N2581 Secondary hyperparathyroidism of renal origin: Secondary | ICD-10-CM | POA: Diagnosis not present

## 2019-03-21 DIAGNOSIS — D259 Leiomyoma of uterus, unspecified: Secondary | ICD-10-CM | POA: Diagnosis not present

## 2019-03-21 DIAGNOSIS — N2581 Secondary hyperparathyroidism of renal origin: Secondary | ICD-10-CM | POA: Diagnosis not present

## 2019-03-21 DIAGNOSIS — N186 End stage renal disease: Secondary | ICD-10-CM | POA: Diagnosis not present

## 2019-03-22 DIAGNOSIS — Z992 Dependence on renal dialysis: Secondary | ICD-10-CM | POA: Diagnosis not present

## 2019-03-22 DIAGNOSIS — N186 End stage renal disease: Secondary | ICD-10-CM | POA: Diagnosis not present

## 2019-03-22 DIAGNOSIS — Q612 Polycystic kidney, adult type: Secondary | ICD-10-CM | POA: Diagnosis not present

## 2019-03-23 DIAGNOSIS — L986 Other infiltrative disorders of the skin and subcutaneous tissue: Secondary | ICD-10-CM | POA: Diagnosis not present

## 2019-03-23 DIAGNOSIS — D259 Leiomyoma of uterus, unspecified: Secondary | ICD-10-CM | POA: Diagnosis not present

## 2019-03-23 DIAGNOSIS — N2581 Secondary hyperparathyroidism of renal origin: Secondary | ICD-10-CM | POA: Diagnosis not present

## 2019-03-23 DIAGNOSIS — N186 End stage renal disease: Secondary | ICD-10-CM | POA: Diagnosis not present

## 2019-03-25 DIAGNOSIS — N186 End stage renal disease: Secondary | ICD-10-CM | POA: Diagnosis not present

## 2019-03-25 DIAGNOSIS — N2581 Secondary hyperparathyroidism of renal origin: Secondary | ICD-10-CM | POA: Diagnosis not present

## 2019-03-25 DIAGNOSIS — D259 Leiomyoma of uterus, unspecified: Secondary | ICD-10-CM | POA: Diagnosis not present

## 2019-03-25 DIAGNOSIS — L986 Other infiltrative disorders of the skin and subcutaneous tissue: Secondary | ICD-10-CM | POA: Diagnosis not present

## 2019-03-27 DIAGNOSIS — N186 End stage renal disease: Secondary | ICD-10-CM | POA: Diagnosis not present

## 2019-03-27 DIAGNOSIS — L986 Other infiltrative disorders of the skin and subcutaneous tissue: Secondary | ICD-10-CM | POA: Insufficient documentation

## 2019-03-27 DIAGNOSIS — D259 Leiomyoma of uterus, unspecified: Secondary | ICD-10-CM | POA: Diagnosis not present

## 2019-03-27 DIAGNOSIS — N2581 Secondary hyperparathyroidism of renal origin: Secondary | ICD-10-CM | POA: Diagnosis not present

## 2019-03-28 DIAGNOSIS — N186 End stage renal disease: Secondary | ICD-10-CM | POA: Diagnosis not present

## 2019-03-28 DIAGNOSIS — L986 Other infiltrative disorders of the skin and subcutaneous tissue: Secondary | ICD-10-CM | POA: Diagnosis not present

## 2019-03-28 DIAGNOSIS — N2581 Secondary hyperparathyroidism of renal origin: Secondary | ICD-10-CM | POA: Diagnosis not present

## 2019-03-28 DIAGNOSIS — D259 Leiomyoma of uterus, unspecified: Secondary | ICD-10-CM | POA: Diagnosis not present

## 2019-03-30 DIAGNOSIS — N186 End stage renal disease: Secondary | ICD-10-CM | POA: Diagnosis not present

## 2019-03-30 DIAGNOSIS — N2581 Secondary hyperparathyroidism of renal origin: Secondary | ICD-10-CM | POA: Diagnosis not present

## 2019-03-30 DIAGNOSIS — D259 Leiomyoma of uterus, unspecified: Secondary | ICD-10-CM | POA: Diagnosis not present

## 2019-03-30 DIAGNOSIS — L986 Other infiltrative disorders of the skin and subcutaneous tissue: Secondary | ICD-10-CM | POA: Diagnosis not present

## 2019-04-01 DIAGNOSIS — D259 Leiomyoma of uterus, unspecified: Secondary | ICD-10-CM | POA: Diagnosis not present

## 2019-04-01 DIAGNOSIS — L986 Other infiltrative disorders of the skin and subcutaneous tissue: Secondary | ICD-10-CM | POA: Diagnosis not present

## 2019-04-01 DIAGNOSIS — N2581 Secondary hyperparathyroidism of renal origin: Secondary | ICD-10-CM | POA: Diagnosis not present

## 2019-04-01 DIAGNOSIS — N186 End stage renal disease: Secondary | ICD-10-CM | POA: Diagnosis not present

## 2019-04-04 DIAGNOSIS — D259 Leiomyoma of uterus, unspecified: Secondary | ICD-10-CM | POA: Diagnosis not present

## 2019-04-04 DIAGNOSIS — N186 End stage renal disease: Secondary | ICD-10-CM | POA: Diagnosis not present

## 2019-04-04 DIAGNOSIS — N2581 Secondary hyperparathyroidism of renal origin: Secondary | ICD-10-CM | POA: Diagnosis not present

## 2019-04-04 DIAGNOSIS — L986 Other infiltrative disorders of the skin and subcutaneous tissue: Secondary | ICD-10-CM | POA: Diagnosis not present

## 2019-04-06 DIAGNOSIS — N186 End stage renal disease: Secondary | ICD-10-CM | POA: Diagnosis not present

## 2019-04-06 DIAGNOSIS — N2581 Secondary hyperparathyroidism of renal origin: Secondary | ICD-10-CM | POA: Diagnosis not present

## 2019-04-06 DIAGNOSIS — L986 Other infiltrative disorders of the skin and subcutaneous tissue: Secondary | ICD-10-CM | POA: Diagnosis not present

## 2019-04-06 DIAGNOSIS — D259 Leiomyoma of uterus, unspecified: Secondary | ICD-10-CM | POA: Diagnosis not present

## 2019-04-07 ENCOUNTER — Other Ambulatory Visit: Payer: Self-pay

## 2019-04-07 ENCOUNTER — Ambulatory Visit (INDEPENDENT_AMBULATORY_CARE_PROVIDER_SITE_OTHER): Payer: Medicare Other | Admitting: Podiatry

## 2019-04-07 ENCOUNTER — Encounter: Payer: Self-pay | Admitting: Podiatry

## 2019-04-07 VITALS — Temp 97.7°F

## 2019-04-07 DIAGNOSIS — M79676 Pain in unspecified toe(s): Secondary | ICD-10-CM

## 2019-04-07 DIAGNOSIS — B351 Tinea unguium: Secondary | ICD-10-CM | POA: Diagnosis not present

## 2019-04-07 NOTE — Progress Notes (Addendum)
Complaint:  Visit Type: Patient returns to my office for continued preventative foot care services. Complaint: Patient states" my nails have grown long and thick and become painful to walk and wear shoes"  The patient presents for preventative foot care services. No changes to ROS  Podiatric Exam: Vascular: dorsalis pedis and posterior tibial pulses are minimally  palpable bilateral. Capillary return is immediate. Temperature gradient is WNL. Skin turgor WNL  Sensorium: Normal Semmes Weinstein monofilament test. Normal tactile sensation bilaterally. Nail Exam: Pt has thick disfigured discolored nails with subungual debris noted bilateral entire nail hallux through fifth toenails.  Pincer hallux nails  B/L. Ulcer Exam: There is no evidence of ulcer or pre-ulcerative changes or infection. Orthopedic Exam: Muscle tone and strength are WNL. No limitations in general ROM. No crepitus or effusions noted. Foot type and digits show no abnormalities. Bony prominences are unremarkable. Skin: No Porokeratosis. No infection or ulcers  Diagnosis:  Onychomycosis, , Pain in right toe, pain in left toes  Treatment & Plan Procedures and Treatment: Consent by patient was obtained for treatment procedures. The patient understood the discussion of treatment and procedures well. All questions were answered thoroughly reviewed. Debridement of mycotic and hypertrophic toenails, 1 through 5 bilateral and clearing of subungual debris. No ulceration, no infection noted. Iatrogenic lesion left hallux was cauterized. Return Visit-Office Procedure: Patient instructed to return to the office for a follow up visit 9  weeks  for continued evaluation and treatment.    Gardiner Barefoot DPM

## 2019-04-08 DIAGNOSIS — N2581 Secondary hyperparathyroidism of renal origin: Secondary | ICD-10-CM | POA: Diagnosis not present

## 2019-04-08 DIAGNOSIS — D259 Leiomyoma of uterus, unspecified: Secondary | ICD-10-CM | POA: Diagnosis not present

## 2019-04-08 DIAGNOSIS — N186 End stage renal disease: Secondary | ICD-10-CM | POA: Diagnosis not present

## 2019-04-08 DIAGNOSIS — L986 Other infiltrative disorders of the skin and subcutaneous tissue: Secondary | ICD-10-CM | POA: Diagnosis not present

## 2019-04-11 DIAGNOSIS — N2581 Secondary hyperparathyroidism of renal origin: Secondary | ICD-10-CM | POA: Diagnosis not present

## 2019-04-11 DIAGNOSIS — N186 End stage renal disease: Secondary | ICD-10-CM | POA: Diagnosis not present

## 2019-04-11 DIAGNOSIS — D259 Leiomyoma of uterus, unspecified: Secondary | ICD-10-CM | POA: Diagnosis not present

## 2019-04-11 DIAGNOSIS — L986 Other infiltrative disorders of the skin and subcutaneous tissue: Secondary | ICD-10-CM | POA: Diagnosis not present

## 2019-04-15 DIAGNOSIS — L986 Other infiltrative disorders of the skin and subcutaneous tissue: Secondary | ICD-10-CM | POA: Diagnosis not present

## 2019-04-15 DIAGNOSIS — D259 Leiomyoma of uterus, unspecified: Secondary | ICD-10-CM | POA: Diagnosis not present

## 2019-04-15 DIAGNOSIS — N2581 Secondary hyperparathyroidism of renal origin: Secondary | ICD-10-CM | POA: Diagnosis not present

## 2019-04-15 DIAGNOSIS — N186 End stage renal disease: Secondary | ICD-10-CM | POA: Diagnosis not present

## 2019-04-17 ENCOUNTER — Encounter: Payer: Self-pay | Admitting: Gastroenterology

## 2019-04-18 DIAGNOSIS — N2581 Secondary hyperparathyroidism of renal origin: Secondary | ICD-10-CM | POA: Diagnosis not present

## 2019-04-18 DIAGNOSIS — N186 End stage renal disease: Secondary | ICD-10-CM | POA: Diagnosis not present

## 2019-04-18 DIAGNOSIS — L986 Other infiltrative disorders of the skin and subcutaneous tissue: Secondary | ICD-10-CM | POA: Diagnosis not present

## 2019-04-18 DIAGNOSIS — D259 Leiomyoma of uterus, unspecified: Secondary | ICD-10-CM | POA: Diagnosis not present

## 2019-04-20 DIAGNOSIS — N2581 Secondary hyperparathyroidism of renal origin: Secondary | ICD-10-CM | POA: Diagnosis not present

## 2019-04-20 DIAGNOSIS — D259 Leiomyoma of uterus, unspecified: Secondary | ICD-10-CM | POA: Diagnosis not present

## 2019-04-20 DIAGNOSIS — L986 Other infiltrative disorders of the skin and subcutaneous tissue: Secondary | ICD-10-CM | POA: Diagnosis not present

## 2019-04-20 DIAGNOSIS — N186 End stage renal disease: Secondary | ICD-10-CM | POA: Diagnosis not present

## 2019-04-22 DIAGNOSIS — N186 End stage renal disease: Secondary | ICD-10-CM | POA: Diagnosis not present

## 2019-04-22 DIAGNOSIS — N2581 Secondary hyperparathyroidism of renal origin: Secondary | ICD-10-CM | POA: Diagnosis not present

## 2019-04-22 DIAGNOSIS — Q612 Polycystic kidney, adult type: Secondary | ICD-10-CM | POA: Diagnosis not present

## 2019-04-22 DIAGNOSIS — D259 Leiomyoma of uterus, unspecified: Secondary | ICD-10-CM | POA: Diagnosis not present

## 2019-04-22 DIAGNOSIS — Z992 Dependence on renal dialysis: Secondary | ICD-10-CM | POA: Diagnosis not present

## 2019-04-25 DIAGNOSIS — N2581 Secondary hyperparathyroidism of renal origin: Secondary | ICD-10-CM | POA: Diagnosis not present

## 2019-04-25 DIAGNOSIS — Z992 Dependence on renal dialysis: Secondary | ICD-10-CM | POA: Diagnosis not present

## 2019-04-25 DIAGNOSIS — D259 Leiomyoma of uterus, unspecified: Secondary | ICD-10-CM | POA: Diagnosis not present

## 2019-04-25 DIAGNOSIS — N186 End stage renal disease: Secondary | ICD-10-CM | POA: Diagnosis not present

## 2019-04-27 DIAGNOSIS — N2581 Secondary hyperparathyroidism of renal origin: Secondary | ICD-10-CM | POA: Diagnosis not present

## 2019-04-27 DIAGNOSIS — Z992 Dependence on renal dialysis: Secondary | ICD-10-CM | POA: Diagnosis not present

## 2019-04-27 DIAGNOSIS — D259 Leiomyoma of uterus, unspecified: Secondary | ICD-10-CM | POA: Diagnosis not present

## 2019-04-27 DIAGNOSIS — N186 End stage renal disease: Secondary | ICD-10-CM | POA: Diagnosis not present

## 2019-04-29 DIAGNOSIS — Z992 Dependence on renal dialysis: Secondary | ICD-10-CM | POA: Diagnosis not present

## 2019-04-29 DIAGNOSIS — D259 Leiomyoma of uterus, unspecified: Secondary | ICD-10-CM | POA: Diagnosis not present

## 2019-04-29 DIAGNOSIS — N186 End stage renal disease: Secondary | ICD-10-CM | POA: Diagnosis not present

## 2019-04-29 DIAGNOSIS — N2581 Secondary hyperparathyroidism of renal origin: Secondary | ICD-10-CM | POA: Diagnosis not present

## 2019-05-01 DIAGNOSIS — M79642 Pain in left hand: Secondary | ICD-10-CM | POA: Diagnosis not present

## 2019-05-01 DIAGNOSIS — M79641 Pain in right hand: Secondary | ICD-10-CM | POA: Diagnosis not present

## 2019-05-02 DIAGNOSIS — N2581 Secondary hyperparathyroidism of renal origin: Secondary | ICD-10-CM | POA: Diagnosis not present

## 2019-05-02 DIAGNOSIS — Z992 Dependence on renal dialysis: Secondary | ICD-10-CM | POA: Diagnosis not present

## 2019-05-02 DIAGNOSIS — D259 Leiomyoma of uterus, unspecified: Secondary | ICD-10-CM | POA: Diagnosis not present

## 2019-05-02 DIAGNOSIS — N186 End stage renal disease: Secondary | ICD-10-CM | POA: Diagnosis not present

## 2019-05-04 DIAGNOSIS — T82868A Thrombosis of vascular prosthetic devices, implants and grafts, initial encounter: Secondary | ICD-10-CM | POA: Diagnosis not present

## 2019-05-04 DIAGNOSIS — I871 Compression of vein: Secondary | ICD-10-CM | POA: Diagnosis not present

## 2019-05-04 DIAGNOSIS — Z992 Dependence on renal dialysis: Secondary | ICD-10-CM | POA: Diagnosis not present

## 2019-05-04 DIAGNOSIS — N186 End stage renal disease: Secondary | ICD-10-CM | POA: Diagnosis not present

## 2019-05-05 DIAGNOSIS — D259 Leiomyoma of uterus, unspecified: Secondary | ICD-10-CM | POA: Diagnosis not present

## 2019-05-05 DIAGNOSIS — N2581 Secondary hyperparathyroidism of renal origin: Secondary | ICD-10-CM | POA: Diagnosis not present

## 2019-05-05 DIAGNOSIS — N186 End stage renal disease: Secondary | ICD-10-CM | POA: Diagnosis not present

## 2019-05-05 DIAGNOSIS — Z992 Dependence on renal dialysis: Secondary | ICD-10-CM | POA: Diagnosis not present

## 2019-05-09 DIAGNOSIS — N2581 Secondary hyperparathyroidism of renal origin: Secondary | ICD-10-CM | POA: Diagnosis not present

## 2019-05-09 DIAGNOSIS — N186 End stage renal disease: Secondary | ICD-10-CM | POA: Diagnosis not present

## 2019-05-09 DIAGNOSIS — D259 Leiomyoma of uterus, unspecified: Secondary | ICD-10-CM | POA: Diagnosis not present

## 2019-05-09 DIAGNOSIS — Z992 Dependence on renal dialysis: Secondary | ICD-10-CM | POA: Diagnosis not present

## 2019-05-11 DIAGNOSIS — D259 Leiomyoma of uterus, unspecified: Secondary | ICD-10-CM | POA: Diagnosis not present

## 2019-05-11 DIAGNOSIS — N186 End stage renal disease: Secondary | ICD-10-CM | POA: Diagnosis not present

## 2019-05-11 DIAGNOSIS — Z992 Dependence on renal dialysis: Secondary | ICD-10-CM | POA: Diagnosis not present

## 2019-05-11 DIAGNOSIS — N2581 Secondary hyperparathyroidism of renal origin: Secondary | ICD-10-CM | POA: Diagnosis not present

## 2019-05-13 DIAGNOSIS — Z992 Dependence on renal dialysis: Secondary | ICD-10-CM | POA: Diagnosis not present

## 2019-05-13 DIAGNOSIS — N186 End stage renal disease: Secondary | ICD-10-CM | POA: Diagnosis not present

## 2019-05-13 DIAGNOSIS — D259 Leiomyoma of uterus, unspecified: Secondary | ICD-10-CM | POA: Diagnosis not present

## 2019-05-13 DIAGNOSIS — N2581 Secondary hyperparathyroidism of renal origin: Secondary | ICD-10-CM | POA: Diagnosis not present

## 2019-05-16 DIAGNOSIS — Z992 Dependence on renal dialysis: Secondary | ICD-10-CM | POA: Diagnosis not present

## 2019-05-16 DIAGNOSIS — N2581 Secondary hyperparathyroidism of renal origin: Secondary | ICD-10-CM | POA: Diagnosis not present

## 2019-05-16 DIAGNOSIS — N186 End stage renal disease: Secondary | ICD-10-CM | POA: Diagnosis not present

## 2019-05-16 DIAGNOSIS — D259 Leiomyoma of uterus, unspecified: Secondary | ICD-10-CM | POA: Diagnosis not present

## 2019-05-20 DIAGNOSIS — N186 End stage renal disease: Secondary | ICD-10-CM | POA: Diagnosis not present

## 2019-05-20 DIAGNOSIS — N2581 Secondary hyperparathyroidism of renal origin: Secondary | ICD-10-CM | POA: Diagnosis not present

## 2019-05-20 DIAGNOSIS — Z992 Dependence on renal dialysis: Secondary | ICD-10-CM | POA: Diagnosis not present

## 2019-05-20 DIAGNOSIS — D259 Leiomyoma of uterus, unspecified: Secondary | ICD-10-CM | POA: Diagnosis not present

## 2019-05-23 DIAGNOSIS — N186 End stage renal disease: Secondary | ICD-10-CM | POA: Diagnosis not present

## 2019-05-23 DIAGNOSIS — Z992 Dependence on renal dialysis: Secondary | ICD-10-CM | POA: Diagnosis not present

## 2019-05-23 DIAGNOSIS — E875 Hyperkalemia: Secondary | ICD-10-CM | POA: Diagnosis not present

## 2019-05-23 DIAGNOSIS — D259 Leiomyoma of uterus, unspecified: Secondary | ICD-10-CM | POA: Diagnosis not present

## 2019-05-23 DIAGNOSIS — N2581 Secondary hyperparathyroidism of renal origin: Secondary | ICD-10-CM | POA: Diagnosis not present

## 2019-05-23 DIAGNOSIS — Q612 Polycystic kidney, adult type: Secondary | ICD-10-CM | POA: Diagnosis not present

## 2019-05-23 DIAGNOSIS — D509 Iron deficiency anemia, unspecified: Secondary | ICD-10-CM | POA: Diagnosis not present

## 2019-05-25 DIAGNOSIS — N2581 Secondary hyperparathyroidism of renal origin: Secondary | ICD-10-CM | POA: Diagnosis not present

## 2019-05-25 DIAGNOSIS — Z992 Dependence on renal dialysis: Secondary | ICD-10-CM | POA: Diagnosis not present

## 2019-05-25 DIAGNOSIS — E875 Hyperkalemia: Secondary | ICD-10-CM | POA: Diagnosis not present

## 2019-05-25 DIAGNOSIS — N186 End stage renal disease: Secondary | ICD-10-CM | POA: Diagnosis not present

## 2019-05-25 DIAGNOSIS — D509 Iron deficiency anemia, unspecified: Secondary | ICD-10-CM | POA: Diagnosis not present

## 2019-05-25 DIAGNOSIS — D259 Leiomyoma of uterus, unspecified: Secondary | ICD-10-CM | POA: Diagnosis not present

## 2019-05-27 DIAGNOSIS — E875 Hyperkalemia: Secondary | ICD-10-CM | POA: Diagnosis not present

## 2019-05-27 DIAGNOSIS — N2581 Secondary hyperparathyroidism of renal origin: Secondary | ICD-10-CM | POA: Diagnosis not present

## 2019-05-27 DIAGNOSIS — Z992 Dependence on renal dialysis: Secondary | ICD-10-CM | POA: Diagnosis not present

## 2019-05-27 DIAGNOSIS — D259 Leiomyoma of uterus, unspecified: Secondary | ICD-10-CM | POA: Diagnosis not present

## 2019-05-27 DIAGNOSIS — D509 Iron deficiency anemia, unspecified: Secondary | ICD-10-CM | POA: Diagnosis not present

## 2019-05-27 DIAGNOSIS — N186 End stage renal disease: Secondary | ICD-10-CM | POA: Diagnosis not present

## 2019-05-30 DIAGNOSIS — Z992 Dependence on renal dialysis: Secondary | ICD-10-CM | POA: Diagnosis not present

## 2019-05-30 DIAGNOSIS — N186 End stage renal disease: Secondary | ICD-10-CM | POA: Diagnosis not present

## 2019-05-30 DIAGNOSIS — D259 Leiomyoma of uterus, unspecified: Secondary | ICD-10-CM | POA: Diagnosis not present

## 2019-05-30 DIAGNOSIS — D509 Iron deficiency anemia, unspecified: Secondary | ICD-10-CM | POA: Diagnosis not present

## 2019-05-30 DIAGNOSIS — N2581 Secondary hyperparathyroidism of renal origin: Secondary | ICD-10-CM | POA: Diagnosis not present

## 2019-05-30 DIAGNOSIS — E875 Hyperkalemia: Secondary | ICD-10-CM | POA: Diagnosis not present

## 2019-06-01 DIAGNOSIS — N2581 Secondary hyperparathyroidism of renal origin: Secondary | ICD-10-CM | POA: Diagnosis not present

## 2019-06-01 DIAGNOSIS — D509 Iron deficiency anemia, unspecified: Secondary | ICD-10-CM | POA: Diagnosis not present

## 2019-06-01 DIAGNOSIS — E875 Hyperkalemia: Secondary | ICD-10-CM | POA: Diagnosis not present

## 2019-06-01 DIAGNOSIS — N186 End stage renal disease: Secondary | ICD-10-CM | POA: Diagnosis not present

## 2019-06-01 DIAGNOSIS — D259 Leiomyoma of uterus, unspecified: Secondary | ICD-10-CM | POA: Diagnosis not present

## 2019-06-01 DIAGNOSIS — Z992 Dependence on renal dialysis: Secondary | ICD-10-CM | POA: Diagnosis not present

## 2019-06-03 DIAGNOSIS — D259 Leiomyoma of uterus, unspecified: Secondary | ICD-10-CM | POA: Diagnosis not present

## 2019-06-03 DIAGNOSIS — N186 End stage renal disease: Secondary | ICD-10-CM | POA: Diagnosis not present

## 2019-06-03 DIAGNOSIS — E875 Hyperkalemia: Secondary | ICD-10-CM | POA: Diagnosis not present

## 2019-06-03 DIAGNOSIS — N2581 Secondary hyperparathyroidism of renal origin: Secondary | ICD-10-CM | POA: Diagnosis not present

## 2019-06-03 DIAGNOSIS — Z992 Dependence on renal dialysis: Secondary | ICD-10-CM | POA: Diagnosis not present

## 2019-06-03 DIAGNOSIS — D509 Iron deficiency anemia, unspecified: Secondary | ICD-10-CM | POA: Diagnosis not present

## 2019-06-06 DIAGNOSIS — N186 End stage renal disease: Secondary | ICD-10-CM | POA: Diagnosis not present

## 2019-06-06 DIAGNOSIS — D259 Leiomyoma of uterus, unspecified: Secondary | ICD-10-CM | POA: Diagnosis not present

## 2019-06-06 DIAGNOSIS — D509 Iron deficiency anemia, unspecified: Secondary | ICD-10-CM | POA: Diagnosis not present

## 2019-06-06 DIAGNOSIS — N2581 Secondary hyperparathyroidism of renal origin: Secondary | ICD-10-CM | POA: Diagnosis not present

## 2019-06-06 DIAGNOSIS — E875 Hyperkalemia: Secondary | ICD-10-CM | POA: Diagnosis not present

## 2019-06-06 DIAGNOSIS — Z992 Dependence on renal dialysis: Secondary | ICD-10-CM | POA: Diagnosis not present

## 2019-06-08 DIAGNOSIS — Z992 Dependence on renal dialysis: Secondary | ICD-10-CM | POA: Diagnosis not present

## 2019-06-08 DIAGNOSIS — E875 Hyperkalemia: Secondary | ICD-10-CM | POA: Diagnosis not present

## 2019-06-08 DIAGNOSIS — N2581 Secondary hyperparathyroidism of renal origin: Secondary | ICD-10-CM | POA: Diagnosis not present

## 2019-06-08 DIAGNOSIS — D509 Iron deficiency anemia, unspecified: Secondary | ICD-10-CM | POA: Diagnosis not present

## 2019-06-08 DIAGNOSIS — N186 End stage renal disease: Secondary | ICD-10-CM | POA: Diagnosis not present

## 2019-06-08 DIAGNOSIS — D259 Leiomyoma of uterus, unspecified: Secondary | ICD-10-CM | POA: Diagnosis not present

## 2019-06-10 DIAGNOSIS — E875 Hyperkalemia: Secondary | ICD-10-CM | POA: Diagnosis not present

## 2019-06-10 DIAGNOSIS — N186 End stage renal disease: Secondary | ICD-10-CM | POA: Diagnosis not present

## 2019-06-10 DIAGNOSIS — Z992 Dependence on renal dialysis: Secondary | ICD-10-CM | POA: Diagnosis not present

## 2019-06-10 DIAGNOSIS — D259 Leiomyoma of uterus, unspecified: Secondary | ICD-10-CM | POA: Diagnosis not present

## 2019-06-10 DIAGNOSIS — D509 Iron deficiency anemia, unspecified: Secondary | ICD-10-CM | POA: Diagnosis not present

## 2019-06-10 DIAGNOSIS — N2581 Secondary hyperparathyroidism of renal origin: Secondary | ICD-10-CM | POA: Diagnosis not present

## 2019-06-13 DIAGNOSIS — D509 Iron deficiency anemia, unspecified: Secondary | ICD-10-CM | POA: Diagnosis not present

## 2019-06-13 DIAGNOSIS — E875 Hyperkalemia: Secondary | ICD-10-CM | POA: Diagnosis not present

## 2019-06-13 DIAGNOSIS — D259 Leiomyoma of uterus, unspecified: Secondary | ICD-10-CM | POA: Diagnosis not present

## 2019-06-13 DIAGNOSIS — Z992 Dependence on renal dialysis: Secondary | ICD-10-CM | POA: Diagnosis not present

## 2019-06-13 DIAGNOSIS — N186 End stage renal disease: Secondary | ICD-10-CM | POA: Diagnosis not present

## 2019-06-13 DIAGNOSIS — N2581 Secondary hyperparathyroidism of renal origin: Secondary | ICD-10-CM | POA: Diagnosis not present

## 2019-06-15 DIAGNOSIS — D509 Iron deficiency anemia, unspecified: Secondary | ICD-10-CM | POA: Diagnosis not present

## 2019-06-15 DIAGNOSIS — N186 End stage renal disease: Secondary | ICD-10-CM | POA: Diagnosis not present

## 2019-06-15 DIAGNOSIS — D259 Leiomyoma of uterus, unspecified: Secondary | ICD-10-CM | POA: Diagnosis not present

## 2019-06-15 DIAGNOSIS — E875 Hyperkalemia: Secondary | ICD-10-CM | POA: Diagnosis not present

## 2019-06-15 DIAGNOSIS — Z992 Dependence on renal dialysis: Secondary | ICD-10-CM | POA: Diagnosis not present

## 2019-06-15 DIAGNOSIS — N2581 Secondary hyperparathyroidism of renal origin: Secondary | ICD-10-CM | POA: Diagnosis not present

## 2019-06-16 ENCOUNTER — Other Ambulatory Visit: Payer: Self-pay

## 2019-06-16 ENCOUNTER — Encounter: Payer: Self-pay | Admitting: Podiatry

## 2019-06-16 ENCOUNTER — Ambulatory Visit (INDEPENDENT_AMBULATORY_CARE_PROVIDER_SITE_OTHER): Payer: Medicare Other | Admitting: Podiatry

## 2019-06-16 DIAGNOSIS — B351 Tinea unguium: Secondary | ICD-10-CM

## 2019-06-16 DIAGNOSIS — M79676 Pain in unspecified toe(s): Secondary | ICD-10-CM

## 2019-06-16 NOTE — Progress Notes (Signed)
Complaint:  Visit Type: Patient returns to my office for continued preventative foot care services. Complaint: Patient states" my nails have grown long and thick and become painful to walk and wear shoes"  The patient presents for preventative foot care services. No changes to ROS  Podiatric Exam: Vascular: dorsalis pedis and posterior tibial pulses are minimally  palpable bilateral. Capillary return is immediate. Temperature gradient is WNL. Skin turgor WNL  Sensorium: Normal Semmes Weinstein monofilament test. Normal tactile sensation bilaterally. Nail Exam: Pt has thick disfigured discolored nails with subungual debris noted bilateral entire nail hallux through fifth toenails.  Pincer hallux nails  B/L. Ulcer Exam: There is no evidence of ulcer or pre-ulcerative changes or infection. Orthopedic Exam: Muscle tone and strength are WNL. No limitations in general ROM. No crepitus or effusions noted. Foot type and digits show no abnormalities. Bony prominences are unremarkable. Skin: No Porokeratosis. No infection or ulcers  Diagnosis:  Onychomycosis, , Pain in right toe, pain in left toes  Treatment & Plan Procedures and Treatment: Consent by patient was obtained for treatment procedures. The patient understood the discussion of treatment and procedures well. All questions were answered thoroughly reviewed. Debridement of mycotic and hypertrophic toenails, 1 through 5 bilateral and clearing of subungual debris. No ulceration, no infection noted.  Return Visit-Office Procedure: Patient instructed to return to the office for a follow up visit 9  weeks  for continued evaluation and treatment.    Gardiner Barefoot DPM

## 2019-06-17 DIAGNOSIS — E875 Hyperkalemia: Secondary | ICD-10-CM | POA: Diagnosis not present

## 2019-06-17 DIAGNOSIS — Z992 Dependence on renal dialysis: Secondary | ICD-10-CM | POA: Diagnosis not present

## 2019-06-17 DIAGNOSIS — N186 End stage renal disease: Secondary | ICD-10-CM | POA: Diagnosis not present

## 2019-06-17 DIAGNOSIS — D259 Leiomyoma of uterus, unspecified: Secondary | ICD-10-CM | POA: Diagnosis not present

## 2019-06-17 DIAGNOSIS — N2581 Secondary hyperparathyroidism of renal origin: Secondary | ICD-10-CM | POA: Diagnosis not present

## 2019-06-17 DIAGNOSIS — D509 Iron deficiency anemia, unspecified: Secondary | ICD-10-CM | POA: Diagnosis not present

## 2019-06-20 DIAGNOSIS — N2581 Secondary hyperparathyroidism of renal origin: Secondary | ICD-10-CM | POA: Diagnosis not present

## 2019-06-20 DIAGNOSIS — E875 Hyperkalemia: Secondary | ICD-10-CM | POA: Diagnosis not present

## 2019-06-20 DIAGNOSIS — N186 End stage renal disease: Secondary | ICD-10-CM | POA: Diagnosis not present

## 2019-06-20 DIAGNOSIS — D259 Leiomyoma of uterus, unspecified: Secondary | ICD-10-CM | POA: Diagnosis not present

## 2019-06-20 DIAGNOSIS — D509 Iron deficiency anemia, unspecified: Secondary | ICD-10-CM | POA: Diagnosis not present

## 2019-06-20 DIAGNOSIS — Z992 Dependence on renal dialysis: Secondary | ICD-10-CM | POA: Diagnosis not present

## 2019-06-21 ENCOUNTER — Other Ambulatory Visit: Payer: Self-pay | Admitting: Nephrology

## 2019-06-21 ENCOUNTER — Encounter: Payer: Self-pay | Admitting: Gastroenterology

## 2019-06-21 DIAGNOSIS — Z1231 Encounter for screening mammogram for malignant neoplasm of breast: Secondary | ICD-10-CM

## 2019-06-22 DIAGNOSIS — Q612 Polycystic kidney, adult type: Secondary | ICD-10-CM | POA: Diagnosis not present

## 2019-06-22 DIAGNOSIS — N186 End stage renal disease: Secondary | ICD-10-CM | POA: Diagnosis not present

## 2019-06-22 DIAGNOSIS — D631 Anemia in chronic kidney disease: Secondary | ICD-10-CM | POA: Diagnosis not present

## 2019-06-22 DIAGNOSIS — Z992 Dependence on renal dialysis: Secondary | ICD-10-CM | POA: Diagnosis not present

## 2019-06-22 DIAGNOSIS — D259 Leiomyoma of uterus, unspecified: Secondary | ICD-10-CM | POA: Diagnosis not present

## 2019-06-22 DIAGNOSIS — N2581 Secondary hyperparathyroidism of renal origin: Secondary | ICD-10-CM | POA: Diagnosis not present

## 2019-06-24 DIAGNOSIS — Z992 Dependence on renal dialysis: Secondary | ICD-10-CM | POA: Diagnosis not present

## 2019-06-24 DIAGNOSIS — D259 Leiomyoma of uterus, unspecified: Secondary | ICD-10-CM | POA: Diagnosis not present

## 2019-06-24 DIAGNOSIS — N2581 Secondary hyperparathyroidism of renal origin: Secondary | ICD-10-CM | POA: Diagnosis not present

## 2019-06-24 DIAGNOSIS — D631 Anemia in chronic kidney disease: Secondary | ICD-10-CM | POA: Diagnosis not present

## 2019-06-24 DIAGNOSIS — N186 End stage renal disease: Secondary | ICD-10-CM | POA: Diagnosis not present

## 2019-06-27 DIAGNOSIS — N186 End stage renal disease: Secondary | ICD-10-CM | POA: Diagnosis not present

## 2019-06-27 DIAGNOSIS — Z992 Dependence on renal dialysis: Secondary | ICD-10-CM | POA: Diagnosis not present

## 2019-06-27 DIAGNOSIS — D259 Leiomyoma of uterus, unspecified: Secondary | ICD-10-CM | POA: Diagnosis not present

## 2019-06-27 DIAGNOSIS — D631 Anemia in chronic kidney disease: Secondary | ICD-10-CM | POA: Diagnosis not present

## 2019-06-27 DIAGNOSIS — N2581 Secondary hyperparathyroidism of renal origin: Secondary | ICD-10-CM | POA: Diagnosis not present

## 2019-06-29 DIAGNOSIS — D259 Leiomyoma of uterus, unspecified: Secondary | ICD-10-CM | POA: Diagnosis not present

## 2019-06-29 DIAGNOSIS — Z992 Dependence on renal dialysis: Secondary | ICD-10-CM | POA: Diagnosis not present

## 2019-06-29 DIAGNOSIS — N2581 Secondary hyperparathyroidism of renal origin: Secondary | ICD-10-CM | POA: Diagnosis not present

## 2019-06-29 DIAGNOSIS — D631 Anemia in chronic kidney disease: Secondary | ICD-10-CM | POA: Diagnosis not present

## 2019-06-29 DIAGNOSIS — N186 End stage renal disease: Secondary | ICD-10-CM | POA: Diagnosis not present

## 2019-06-30 DIAGNOSIS — T782XXS Anaphylactic shock, unspecified, sequela: Secondary | ICD-10-CM | POA: Insufficient documentation

## 2019-07-01 DIAGNOSIS — Z992 Dependence on renal dialysis: Secondary | ICD-10-CM | POA: Diagnosis not present

## 2019-07-01 DIAGNOSIS — N2581 Secondary hyperparathyroidism of renal origin: Secondary | ICD-10-CM | POA: Diagnosis not present

## 2019-07-01 DIAGNOSIS — D631 Anemia in chronic kidney disease: Secondary | ICD-10-CM | POA: Diagnosis not present

## 2019-07-01 DIAGNOSIS — N186 End stage renal disease: Secondary | ICD-10-CM | POA: Diagnosis not present

## 2019-07-01 DIAGNOSIS — D259 Leiomyoma of uterus, unspecified: Secondary | ICD-10-CM | POA: Diagnosis not present

## 2019-07-04 DIAGNOSIS — D631 Anemia in chronic kidney disease: Secondary | ICD-10-CM | POA: Diagnosis not present

## 2019-07-04 DIAGNOSIS — Z992 Dependence on renal dialysis: Secondary | ICD-10-CM | POA: Diagnosis not present

## 2019-07-04 DIAGNOSIS — N186 End stage renal disease: Secondary | ICD-10-CM | POA: Diagnosis not present

## 2019-07-04 DIAGNOSIS — N2581 Secondary hyperparathyroidism of renal origin: Secondary | ICD-10-CM | POA: Diagnosis not present

## 2019-07-04 DIAGNOSIS — D259 Leiomyoma of uterus, unspecified: Secondary | ICD-10-CM | POA: Diagnosis not present

## 2019-07-05 ENCOUNTER — Ambulatory Visit
Admission: RE | Admit: 2019-07-05 | Discharge: 2019-07-05 | Disposition: A | Discharge status: Home / Self Care | Payer: Medicare Other | Source: Ambulatory Visit | Attending: Nephrology | Admitting: Nephrology

## 2019-07-05 ENCOUNTER — Other Ambulatory Visit: Payer: Self-pay

## 2019-07-05 ENCOUNTER — Other Ambulatory Visit: Payer: Self-pay | Admitting: Nephrology

## 2019-07-05 DIAGNOSIS — Z1231 Encounter for screening mammogram for malignant neoplasm of breast: Secondary | ICD-10-CM

## 2019-07-08 DIAGNOSIS — Z992 Dependence on renal dialysis: Secondary | ICD-10-CM | POA: Diagnosis not present

## 2019-07-08 DIAGNOSIS — D631 Anemia in chronic kidney disease: Secondary | ICD-10-CM | POA: Diagnosis not present

## 2019-07-08 DIAGNOSIS — D259 Leiomyoma of uterus, unspecified: Secondary | ICD-10-CM | POA: Diagnosis not present

## 2019-07-08 DIAGNOSIS — N2581 Secondary hyperparathyroidism of renal origin: Secondary | ICD-10-CM | POA: Diagnosis not present

## 2019-07-08 DIAGNOSIS — N186 End stage renal disease: Secondary | ICD-10-CM | POA: Diagnosis not present

## 2019-07-11 DIAGNOSIS — Z992 Dependence on renal dialysis: Secondary | ICD-10-CM | POA: Diagnosis not present

## 2019-07-11 DIAGNOSIS — D259 Leiomyoma of uterus, unspecified: Secondary | ICD-10-CM | POA: Diagnosis not present

## 2019-07-11 DIAGNOSIS — N186 End stage renal disease: Secondary | ICD-10-CM | POA: Diagnosis not present

## 2019-07-11 DIAGNOSIS — N2581 Secondary hyperparathyroidism of renal origin: Secondary | ICD-10-CM | POA: Diagnosis not present

## 2019-07-11 DIAGNOSIS — D631 Anemia in chronic kidney disease: Secondary | ICD-10-CM | POA: Diagnosis not present

## 2019-07-13 ENCOUNTER — Telehealth: Payer: Self-pay | Admitting: *Deleted

## 2019-07-13 DIAGNOSIS — Z992 Dependence on renal dialysis: Secondary | ICD-10-CM | POA: Diagnosis not present

## 2019-07-13 DIAGNOSIS — N2581 Secondary hyperparathyroidism of renal origin: Secondary | ICD-10-CM | POA: Diagnosis not present

## 2019-07-13 DIAGNOSIS — D631 Anemia in chronic kidney disease: Secondary | ICD-10-CM | POA: Diagnosis not present

## 2019-07-13 DIAGNOSIS — D259 Leiomyoma of uterus, unspecified: Secondary | ICD-10-CM | POA: Diagnosis not present

## 2019-07-13 DIAGNOSIS — N186 End stage renal disease: Secondary | ICD-10-CM | POA: Diagnosis not present

## 2019-07-13 NOTE — Telephone Encounter (Signed)
Patient is scheduled for a recall colonoscopy at [facility]. Last colon was 04/29/2016 at [facility] due to pt on hemodialysis and difficult IV access per your OV notes. OK for colonoscopy at [facility] or [facility]? Please advise. Thanks, Aziza Stuckert pv

## 2019-07-13 NOTE — Telephone Encounter (Signed)
Patient called and made OV with Dr.Jacobs on 08/25/2019.

## 2019-07-13 NOTE — Telephone Encounter (Signed)
Called patient, no answer. Left a message for the patient to return my call to make an office visit appointment with Dr.Jacobs.

## 2019-07-13 NOTE — Telephone Encounter (Signed)
I think doing this at hospital is probably a good idea again.    CAn you point her to a OV with me so that she and I can discuss in person before scheduling.  Thanks

## 2019-07-15 DIAGNOSIS — Z992 Dependence on renal dialysis: Secondary | ICD-10-CM | POA: Diagnosis not present

## 2019-07-15 DIAGNOSIS — D631 Anemia in chronic kidney disease: Secondary | ICD-10-CM | POA: Diagnosis not present

## 2019-07-15 DIAGNOSIS — N2581 Secondary hyperparathyroidism of renal origin: Secondary | ICD-10-CM | POA: Diagnosis not present

## 2019-07-15 DIAGNOSIS — D259 Leiomyoma of uterus, unspecified: Secondary | ICD-10-CM | POA: Diagnosis not present

## 2019-07-15 DIAGNOSIS — N186 End stage renal disease: Secondary | ICD-10-CM | POA: Diagnosis not present

## 2019-07-18 DIAGNOSIS — D631 Anemia in chronic kidney disease: Secondary | ICD-10-CM | POA: Diagnosis not present

## 2019-07-18 DIAGNOSIS — N186 End stage renal disease: Secondary | ICD-10-CM | POA: Diagnosis not present

## 2019-07-18 DIAGNOSIS — Z992 Dependence on renal dialysis: Secondary | ICD-10-CM | POA: Diagnosis not present

## 2019-07-18 DIAGNOSIS — D259 Leiomyoma of uterus, unspecified: Secondary | ICD-10-CM | POA: Diagnosis not present

## 2019-07-18 DIAGNOSIS — N2581 Secondary hyperparathyroidism of renal origin: Secondary | ICD-10-CM | POA: Diagnosis not present

## 2019-07-20 DIAGNOSIS — D259 Leiomyoma of uterus, unspecified: Secondary | ICD-10-CM | POA: Diagnosis not present

## 2019-07-20 DIAGNOSIS — N186 End stage renal disease: Secondary | ICD-10-CM | POA: Diagnosis not present

## 2019-07-20 DIAGNOSIS — N2581 Secondary hyperparathyroidism of renal origin: Secondary | ICD-10-CM | POA: Diagnosis not present

## 2019-07-20 DIAGNOSIS — Z992 Dependence on renal dialysis: Secondary | ICD-10-CM | POA: Diagnosis not present

## 2019-07-20 DIAGNOSIS — D631 Anemia in chronic kidney disease: Secondary | ICD-10-CM | POA: Diagnosis not present

## 2019-07-22 DIAGNOSIS — D259 Leiomyoma of uterus, unspecified: Secondary | ICD-10-CM | POA: Diagnosis not present

## 2019-07-22 DIAGNOSIS — N2581 Secondary hyperparathyroidism of renal origin: Secondary | ICD-10-CM | POA: Diagnosis not present

## 2019-07-22 DIAGNOSIS — D631 Anemia in chronic kidney disease: Secondary | ICD-10-CM | POA: Diagnosis not present

## 2019-07-22 DIAGNOSIS — N186 End stage renal disease: Secondary | ICD-10-CM | POA: Diagnosis not present

## 2019-07-22 DIAGNOSIS — Z992 Dependence on renal dialysis: Secondary | ICD-10-CM | POA: Diagnosis not present

## 2019-07-23 DIAGNOSIS — Z992 Dependence on renal dialysis: Secondary | ICD-10-CM | POA: Diagnosis not present

## 2019-07-23 DIAGNOSIS — N186 End stage renal disease: Secondary | ICD-10-CM | POA: Diagnosis not present

## 2019-07-23 DIAGNOSIS — Q612 Polycystic kidney, adult type: Secondary | ICD-10-CM | POA: Diagnosis not present

## 2019-07-24 DIAGNOSIS — M79671 Pain in right foot: Secondary | ICD-10-CM | POA: Diagnosis not present

## 2019-07-24 DIAGNOSIS — M79672 Pain in left foot: Secondary | ICD-10-CM | POA: Diagnosis not present

## 2019-07-24 DIAGNOSIS — L600 Ingrowing nail: Secondary | ICD-10-CM | POA: Diagnosis not present

## 2019-07-25 DIAGNOSIS — Z992 Dependence on renal dialysis: Secondary | ICD-10-CM | POA: Diagnosis not present

## 2019-07-25 DIAGNOSIS — D259 Leiomyoma of uterus, unspecified: Secondary | ICD-10-CM | POA: Diagnosis not present

## 2019-07-25 DIAGNOSIS — N186 End stage renal disease: Secondary | ICD-10-CM | POA: Diagnosis not present

## 2019-07-25 DIAGNOSIS — N2581 Secondary hyperparathyroidism of renal origin: Secondary | ICD-10-CM | POA: Diagnosis not present

## 2019-07-27 DIAGNOSIS — D259 Leiomyoma of uterus, unspecified: Secondary | ICD-10-CM | POA: Diagnosis not present

## 2019-07-27 DIAGNOSIS — N186 End stage renal disease: Secondary | ICD-10-CM | POA: Diagnosis not present

## 2019-07-27 DIAGNOSIS — Z992 Dependence on renal dialysis: Secondary | ICD-10-CM | POA: Diagnosis not present

## 2019-07-27 DIAGNOSIS — N2581 Secondary hyperparathyroidism of renal origin: Secondary | ICD-10-CM | POA: Diagnosis not present

## 2019-07-29 DIAGNOSIS — D259 Leiomyoma of uterus, unspecified: Secondary | ICD-10-CM | POA: Diagnosis not present

## 2019-07-29 DIAGNOSIS — Z992 Dependence on renal dialysis: Secondary | ICD-10-CM | POA: Diagnosis not present

## 2019-07-29 DIAGNOSIS — N186 End stage renal disease: Secondary | ICD-10-CM | POA: Diagnosis not present

## 2019-07-29 DIAGNOSIS — N2581 Secondary hyperparathyroidism of renal origin: Secondary | ICD-10-CM | POA: Diagnosis not present

## 2019-08-01 DIAGNOSIS — N186 End stage renal disease: Secondary | ICD-10-CM | POA: Diagnosis not present

## 2019-08-01 DIAGNOSIS — N2581 Secondary hyperparathyroidism of renal origin: Secondary | ICD-10-CM | POA: Diagnosis not present

## 2019-08-01 DIAGNOSIS — D259 Leiomyoma of uterus, unspecified: Secondary | ICD-10-CM | POA: Diagnosis not present

## 2019-08-01 DIAGNOSIS — Z992 Dependence on renal dialysis: Secondary | ICD-10-CM | POA: Diagnosis not present

## 2019-08-02 ENCOUNTER — Encounter: Payer: Medicare Other | Admitting: Gastroenterology

## 2019-08-03 DIAGNOSIS — M25561 Pain in right knee: Secondary | ICD-10-CM | POA: Insufficient documentation

## 2019-08-04 DIAGNOSIS — M25562 Pain in left knee: Secondary | ICD-10-CM | POA: Diagnosis not present

## 2019-08-04 DIAGNOSIS — M170 Bilateral primary osteoarthritis of knee: Secondary | ICD-10-CM | POA: Diagnosis not present

## 2019-08-04 DIAGNOSIS — M25561 Pain in right knee: Secondary | ICD-10-CM | POA: Diagnosis not present

## 2019-08-05 DIAGNOSIS — D259 Leiomyoma of uterus, unspecified: Secondary | ICD-10-CM | POA: Diagnosis not present

## 2019-08-05 DIAGNOSIS — N186 End stage renal disease: Secondary | ICD-10-CM | POA: Diagnosis not present

## 2019-08-05 DIAGNOSIS — N2581 Secondary hyperparathyroidism of renal origin: Secondary | ICD-10-CM | POA: Diagnosis not present

## 2019-08-05 DIAGNOSIS — Z992 Dependence on renal dialysis: Secondary | ICD-10-CM | POA: Diagnosis not present

## 2019-08-08 DIAGNOSIS — N186 End stage renal disease: Secondary | ICD-10-CM | POA: Diagnosis not present

## 2019-08-08 DIAGNOSIS — N2581 Secondary hyperparathyroidism of renal origin: Secondary | ICD-10-CM | POA: Diagnosis not present

## 2019-08-08 DIAGNOSIS — Z992 Dependence on renal dialysis: Secondary | ICD-10-CM | POA: Diagnosis not present

## 2019-08-08 DIAGNOSIS — D259 Leiomyoma of uterus, unspecified: Secondary | ICD-10-CM | POA: Diagnosis not present

## 2019-08-09 DIAGNOSIS — T1512XA Foreign body in conjunctival sac, left eye, initial encounter: Secondary | ICD-10-CM | POA: Diagnosis not present

## 2019-08-09 DIAGNOSIS — H25813 Combined forms of age-related cataract, bilateral: Secondary | ICD-10-CM | POA: Diagnosis not present

## 2019-08-09 DIAGNOSIS — H40013 Open angle with borderline findings, low risk, bilateral: Secondary | ICD-10-CM | POA: Diagnosis not present

## 2019-08-12 DIAGNOSIS — N186 End stage renal disease: Secondary | ICD-10-CM | POA: Diagnosis not present

## 2019-08-12 DIAGNOSIS — N2581 Secondary hyperparathyroidism of renal origin: Secondary | ICD-10-CM | POA: Diagnosis not present

## 2019-08-12 DIAGNOSIS — D259 Leiomyoma of uterus, unspecified: Secondary | ICD-10-CM | POA: Diagnosis not present

## 2019-08-12 DIAGNOSIS — Z992 Dependence on renal dialysis: Secondary | ICD-10-CM | POA: Diagnosis not present

## 2019-08-14 DIAGNOSIS — D259 Leiomyoma of uterus, unspecified: Secondary | ICD-10-CM | POA: Diagnosis not present

## 2019-08-14 DIAGNOSIS — N186 End stage renal disease: Secondary | ICD-10-CM | POA: Diagnosis not present

## 2019-08-14 DIAGNOSIS — N2581 Secondary hyperparathyroidism of renal origin: Secondary | ICD-10-CM | POA: Diagnosis not present

## 2019-08-14 DIAGNOSIS — Z992 Dependence on renal dialysis: Secondary | ICD-10-CM | POA: Diagnosis not present

## 2019-08-16 DIAGNOSIS — N2581 Secondary hyperparathyroidism of renal origin: Secondary | ICD-10-CM | POA: Diagnosis not present

## 2019-08-16 DIAGNOSIS — Z992 Dependence on renal dialysis: Secondary | ICD-10-CM | POA: Diagnosis not present

## 2019-08-16 DIAGNOSIS — N186 End stage renal disease: Secondary | ICD-10-CM | POA: Diagnosis not present

## 2019-08-16 DIAGNOSIS — D259 Leiomyoma of uterus, unspecified: Secondary | ICD-10-CM | POA: Diagnosis not present

## 2019-08-19 DIAGNOSIS — N186 End stage renal disease: Secondary | ICD-10-CM | POA: Diagnosis not present

## 2019-08-19 DIAGNOSIS — Z992 Dependence on renal dialysis: Secondary | ICD-10-CM | POA: Diagnosis not present

## 2019-08-19 DIAGNOSIS — D259 Leiomyoma of uterus, unspecified: Secondary | ICD-10-CM | POA: Diagnosis not present

## 2019-08-19 DIAGNOSIS — N2581 Secondary hyperparathyroidism of renal origin: Secondary | ICD-10-CM | POA: Diagnosis not present

## 2019-08-25 ENCOUNTER — Encounter: Payer: Self-pay | Admitting: Gastroenterology

## 2019-08-25 ENCOUNTER — Other Ambulatory Visit: Payer: Self-pay

## 2019-08-25 ENCOUNTER — Ambulatory Visit (INDEPENDENT_AMBULATORY_CARE_PROVIDER_SITE_OTHER): Payer: Medicare Other | Admitting: Gastroenterology

## 2019-08-25 ENCOUNTER — Ambulatory Visit: Payer: Medicare Other | Admitting: Podiatry

## 2019-08-25 VITALS — BP 114/60 | HR 80 | Temp 98.3°F | Ht 60.75 in | Wt 186.1 lb

## 2019-08-25 DIAGNOSIS — D126 Benign neoplasm of colon, unspecified: Secondary | ICD-10-CM

## 2019-08-25 DIAGNOSIS — R197 Diarrhea, unspecified: Secondary | ICD-10-CM

## 2019-08-25 MED ORDER — NA SULFATE-K SULFATE-MG SULF 17.5-3.13-1.6 GM/177ML PO SOLN: 1.0000 | Freq: Once | ORAL | 0 refills | Status: AC

## 2019-08-25 NOTE — Progress Notes (Signed)
Review of pertinent gastrointestinal problems: 1.  History of adenomatous colon polyps.  She required a right hemicolectomy 1997 under the direction of Dr. Cristina Gong for a very large polyp.  Repeat colonoscopy 2001 Dr. Cristina Gong found a single subcentimeter adenoma.  Colonoscopy Dr. Ardis Hughs August 2017 found a 2.4 cm semipedunculated polyp in her transverse colon, this was completely removed with snare polypectomy.  Pathology proved to be adenomatous without high-grade dysplasia.    HPI: This is a very pleasant 73 year old woman whom I last saw a little over 3 years ago the time of a colonoscopy.  See those results summarized above.  We contacted her about repeat surveillance colonoscopy and I recommended she come in the office to discuss it given her comorbid conditions.  Today she is telling me that she has also been very bothered by frequent loose stools for the past 5 months.  These are nonbloody.  She will have 2-5 a day.  Pepto-Bismol helps when she takes it, Imodium helps when she takes that as well.  Her weight is down about 6 pounds in the last several months.  She also reminded me that her mother had colon cancer, diagnosed in her late 16s.  Chief complaint is history of precancerous colon polyps, diarrhea  ROS: complete GI ROS as described in HPI, all other review negative.  Constitutional:  No unintentional weight loss   Past Medical History:  Diagnosis Date  . Anemia   . Arthritis   . Back pain, chronic   . Blood transfusion without reported diagnosis   . Cataract   . Complication of anesthesia    " I woke up during the procedure."  . ESRD (end stage renal disease) on dialysis ([place])    "TTS; [place]" (02/07/2016)  . Hyperparathyroidism, secondary ([place])   . Hypertension    off meds now  . 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 Uterine fibroid     Past Surgical History:  Procedure  Laterality Date  . ARTERIOVENOUS GRAFT PLACEMENT Right 03/19/2000   upper arm  . ARTERIOVENOUS GRAFT PLACEMENT Left 12/03/2000   thigh  . CARPAL TUNNEL RELEASE Left 03/10/2013   Procedure: CARPAL TUNNEL RELEASE;  Surgeon: Wynonia Sours, MD;  Location: [place];  Service: Orthopedics;  Laterality: Left;  . CARPAL TUNNEL RELEASE Right 03/28/2015   Procedure: RIGHT CARPAL TUNNEL RELEASE;  Surgeon: Daryll Brod, MD;  Location: [place];  Service: Orthopedics;  Laterality: Right;  ANESTHESIA:  IV REGIONAL FAB  . COLONOSCOPY W/ BIOPSIES    . COLONOSCOPY WITH PROPOFOL N/A 05/14/2016   Procedure: COLONOSCOPY WITH PROPOFOL;  Surgeon: Milus Banister, MD;  Location: [facility];  Service: Endoscopy;  Laterality: N/A;  . HEMICOLECTOMY  02/1996  . HERNIA REPAIR     laparoscopic repair during a [facility] hospitalization from 03/26/2006-03/30/2006  . INCISIONAL HERNIA REPAIR  04/15/2006   laparoscopic  . INSERTION OF DIALYSIS CATHETER Left 06/06/2000   IJ Quinton catheter  . INSERTION OF DIALYSIS CATHETER Right 06/28/2000   IJ Ash catheter  . INSERTION OF DIALYSIS CATHETER Left 08/13/2000   subclavian Ash catheter  . multiple failed grafts     left thigh AVG 12/03/00, clotted -05/31/03, 01/24/04, 08/28/04, 09/06/04( thrombectomy and revision )Left AVG declot procedure including complete AV shuntogram, 08/29/04 left AV thrombolysis and angioplasty 2012 shunto gram to left thigh AVG  . REMOVAL OF A DIALYSIS CATHETER  05/31/2000   Schon catheter  . REVISION OF ARTERIOVENOUS GORETEX GRAFT Left 06/23/2012   thigh; with exc. pseudoaneurysm  . REVISION OF ARTERIOVENOUS GORETEX GRAFT Left 02/07/2016   Procedure: REVISION OF LEFT THIGH ARTERIOVENOUS GORETEX GRAFT;  Surgeon: Angelia Mould, MD;  Location: [place];  Service: Vascular;  Laterality: Left;  . SHUNTOGRAM Left 02/16/2012   thigh; with angioplasty, venous anastomosis; stent, medial graft pseudoaneurysm  . SHUNTOGRAM N/A 02/16/2012    Procedure: Earney Mallet;  Surgeon: Serafina Mitchell, MD;  Location: [facility] CATH LAB;  Service: Cardiovascular;  Laterality: N/A;  . THROMBECTOMY / ARTERIOVENOUS GRAFT REVISION Left 02/07/2016   thigh  . THROMBECTOMY AND REVISION OF ARTERIOVENTOUS (AV) GORETEX  GRAFT Right 06/04/2000   upper arm  . THROMBECTOMY AND REVISION OF ARTERIOVENTOUS (AV) GORETEX  GRAFT Left 09/06/2004   thigh  . TRIGGER FINGER RELEASE Right 03/28/2015   Procedure: RELEASE A-1 PULLEY RIGHT THUMB;  Surgeon: Daryll Brod, MD;  Location: [place];  Service: Orthopedics;  Laterality: Right;    Current Outpatient Medications  Medication Sig Dispense Refill  . acetaminophen (TYLENOL) 500 MG tablet Take 500 mg by mouth 2 (two) times daily as needed for moderate pain (for back).    . cinacalcet (SENSIPAR) 90 MG tablet Take 90 mg by mouth daily after supper. TAKES 2 PILLS (TOTALING 180 MG) DAILY AFTER SUPPER    . ketotifen (ZADITOR) 0.025 % ophthalmic solution Place 1 drop into both eyes daily as needed (for dry eyes).    . midodrine (PROAMATINE) 10 MG tablet Three times a week with dialysis    . NONFORMULARY OR COMPOUNDED ITEM Pharmazen compound: Nail Fungus - Complex Nail and Foot Disorders - Fluconazole 1%, DMSO 25%, Fluocinonide 0.05%, Ketoconazole 2%, apply 1-2 pumps to each affected nail beds and feet twice daily. 120 each 11  . Nutritional Supplements (FEEDING SUPPLEMENT, NEPRO CARB STEADY,) LIQD Take 237 mLs by mouth daily.    . sevelamer (RENVELA) 800 MG tablet Take 800-4,000 mg by mouth 5 (five) times daily. [phone] mg with snacks and 4000 mg with meals     No current facility-administered medications for this visit.     Allergies as of 08/25/2019  . (No Known Allergies)    Family History  Problem Relation Age of Onset  . Colon cancer Mother 75  . Diabetes Cousin   . Breast cancer Maternal Aunt   . Breast cancer Cousin     Social History   Socioeconomic History  . Marital status: Divorced     Spouse name: Not on file  . Number of children: 0  . Years of education: 77  . Highest education level: Not on file  Occupational History  . Occupation: Retired   Scientific laboratory technician  . Financial resource strain: Not on file  . Food insecurity    Worry: Not on file    Inability: Not on file  . Transportation needs    Medical: Not on file    Non-medical: Not on file  Tobacco Use  . Smoking status: Former Smoker    Packs/day: 0.10    Types: Cigarettes    Quit date: 02/03/1978    Years since quitting: 41.5  . Smokeless tobacco: Never Used  Substance and Sexual Activity  . Alcohol use: Yes    Alcohol/week: 0.0 standard drinks    Comment: rare-once a year  . Drug use: No  . Sexual activity: Not on file  Lifestyle  . Physical activity    Days per week:  Not on file    Minutes per session: Not on file  . Stress: Not on file  Relationships  . Social Herbalist on phone: Not on file    Gets together: Not on file    Attends religious service: Not on file    Active member of club or organization: Not on file    Attends meetings of clubs or organizations: Not on file    Relationship status: Not on file  . Intimate partner violence    Fear of current or ex partner: Not on file    Emotionally abused: Not on file    Physically abused: Not on file    Forced sexual activity: Not on file  Other Topics Concern  . Not on file  Social History Narrative   Fun: Bowel, read, try new restaurants.    Denies abuse and feels safe at home.      Physical Exam: Temp 98.3 F (36.8 C)   Ht 5' 0.75" (1.543 m) Comment: height measured without shoes  Wt 186 lb 2 oz (84.4 kg)   LMP 02/16/2012   BMI 35.46 kg/m  Constitutional: generally well-appearing Psychiatric: alert and oriented x3 Abdomen: soft, nontender, nondistended, no obvious ascites, no peritoneal signs, normal bowel sounds No peripheral edema noted in lower extremities  Assessment and plan: 73 y.o. female with history of  precancerous colon polyps, diarrhea  Her diarrhea may be related to lack of IC valve, bile acid related after her right hemicolectomy many years ago.  It did really just started in the past several months and so I think that mechanism is a bit less likely.  She tells me that Imodium helps but she was afraid to take it very often.  I encouraged her that quite the opposite is absolutely safe that she take and I recommended she take it 1 pill once daily shortly after she wakes up every morning.  I also recommended that we proceed with a colonoscopy for polyp surveillance at her soonest convenience.  At the same time I would probably take random colon biopsies if everything else looks normal given her change in bowels.  Please see the "Patient Instructions" section for addition details about the plan.  Owens Loffler, MD [facility] 08/25/2019, 10:18 AM

## 2019-08-25 NOTE — Patient Instructions (Addendum)
If you are age 74 or older, your body mass index should be between 23-30. Your Body mass index is 35.46 kg/m. If this is out of the aforementioned range listed, please consider follow up with your Primary Care Provider.  If you are age 62 or younger, your body mass index should be between 19-25. Your Body mass index is 35.46 kg/m. If this is out of the aformentioned range listed, please consider follow up with your Primary Care Provider.   You have been scheduled for a colonoscopy. Please follow written instructions given to you at your visit today.  Please pick up your prep supplies at the pharmacy within the next 1-3 days. If you use inhalers (even only as needed), please bring them with you on the day of your procedure.  We have sent the following medications to your pharmacy for you to pick up at your convenience: suprep  Start Imodium daily.   I appreciate the opportunity to care for you. Owens Loffler, MD

## 2019-09-20 ENCOUNTER — Other Ambulatory Visit: Payer: Self-pay

## 2019-09-20 ENCOUNTER — Encounter (HOSPITAL_COMMUNITY): Payer: Self-pay | Admitting: Gastroenterology

## 2019-09-22 DIAGNOSIS — Q612 Polycystic kidney, adult type: Secondary | ICD-10-CM | POA: Diagnosis not present

## 2019-09-22 DIAGNOSIS — Z992 Dependence on renal dialysis: Secondary | ICD-10-CM | POA: Diagnosis not present

## 2019-09-22 DIAGNOSIS — N186 End stage renal disease: Secondary | ICD-10-CM | POA: Diagnosis not present

## 2019-09-24 DIAGNOSIS — N186 End stage renal disease: Secondary | ICD-10-CM | POA: Diagnosis not present

## 2019-09-24 DIAGNOSIS — N2581 Secondary hyperparathyroidism of renal origin: Secondary | ICD-10-CM | POA: Diagnosis not present

## 2019-09-24 DIAGNOSIS — Z992 Dependence on renal dialysis: Secondary | ICD-10-CM | POA: Diagnosis not present

## 2019-09-24 DIAGNOSIS — D259 Leiomyoma of uterus, unspecified: Secondary | ICD-10-CM | POA: Diagnosis not present

## 2019-09-25 ENCOUNTER — Other Ambulatory Visit (HOSPITAL_COMMUNITY)
Admission: RE | Admit: 2019-09-25 | Discharge: 2019-09-25 | Disposition: A | Discharge status: Home / Self Care | Payer: Medicare Other | Source: Ambulatory Visit | Attending: Gastroenterology | Admitting: Gastroenterology

## 2019-09-25 DIAGNOSIS — Z20822 Contact with and (suspected) exposure to covid-19: Secondary | ICD-10-CM | POA: Insufficient documentation

## 2019-09-25 DIAGNOSIS — Z01812 Encounter for preprocedural laboratory examination: Secondary | ICD-10-CM | POA: Insufficient documentation

## 2019-09-26 DIAGNOSIS — D259 Leiomyoma of uterus, unspecified: Secondary | ICD-10-CM | POA: Diagnosis not present

## 2019-09-26 DIAGNOSIS — N2581 Secondary hyperparathyroidism of renal origin: Secondary | ICD-10-CM | POA: Diagnosis not present

## 2019-09-26 DIAGNOSIS — Z992 Dependence on renal dialysis: Secondary | ICD-10-CM | POA: Diagnosis not present

## 2019-09-26 DIAGNOSIS — N186 End stage renal disease: Secondary | ICD-10-CM | POA: Diagnosis not present

## 2019-09-26 LAB — NOVEL CORONAVIRUS, NAA (HOSP ORDER, SEND-OUT TO REF LAB; TAT 18-24 HRS): SARS-CoV-2, NAA: NOT DETECTED

## 2019-09-28 ENCOUNTER — Encounter (HOSPITAL_COMMUNITY): Payer: Self-pay | Admitting: Gastroenterology

## 2019-09-28 ENCOUNTER — Ambulatory Visit (HOSPITAL_COMMUNITY)
Admission: RE | Admit: 2019-09-28 | Discharge: 2019-09-28 | Disposition: A | Discharge status: Home / Self Care | Payer: Medicare Other | Attending: Gastroenterology | Admitting: Gastroenterology

## 2019-09-28 ENCOUNTER — Ambulatory Visit (HOSPITAL_COMMUNITY): Payer: Medicare Other | Admitting: Anesthesiology

## 2019-09-28 ENCOUNTER — Other Ambulatory Visit: Payer: Self-pay

## 2019-09-28 ENCOUNTER — Encounter (HOSPITAL_COMMUNITY)
Admission: RE | Disposition: A | Discharge status: Home / Self Care | Payer: Self-pay | Source: Home / Self Care | Attending: Gastroenterology

## 2019-09-28 DIAGNOSIS — D125 Benign neoplasm of sigmoid colon: Secondary | ICD-10-CM | POA: Diagnosis not present

## 2019-09-28 DIAGNOSIS — R197 Diarrhea, unspecified: Secondary | ICD-10-CM

## 2019-09-28 DIAGNOSIS — M1990 Unspecified osteoarthritis, unspecified site: Secondary | ICD-10-CM | POA: Insufficient documentation

## 2019-09-28 DIAGNOSIS — Z86010 Personal history of colonic polyps: Secondary | ICD-10-CM | POA: Insufficient documentation

## 2019-09-28 DIAGNOSIS — Z87891 Personal history of nicotine dependence: Secondary | ICD-10-CM | POA: Diagnosis not present

## 2019-09-28 DIAGNOSIS — N2581 Secondary hyperparathyroidism of renal origin: Secondary | ICD-10-CM | POA: Diagnosis not present

## 2019-09-28 DIAGNOSIS — M549 Dorsalgia, unspecified: Secondary | ICD-10-CM | POA: Diagnosis not present

## 2019-09-28 DIAGNOSIS — Z992 Dependence on renal dialysis: Secondary | ICD-10-CM | POA: Diagnosis not present

## 2019-09-28 DIAGNOSIS — Z833 Family history of diabetes mellitus: Secondary | ICD-10-CM | POA: Diagnosis not present

## 2019-09-28 DIAGNOSIS — D123 Benign neoplasm of transverse colon: Secondary | ICD-10-CM | POA: Diagnosis not present

## 2019-09-28 DIAGNOSIS — D126 Benign neoplasm of colon, unspecified: Secondary | ICD-10-CM

## 2019-09-28 DIAGNOSIS — I120 Hypertensive chronic kidney disease with stage 5 chronic kidney disease or end stage renal disease: Secondary | ICD-10-CM | POA: Insufficient documentation

## 2019-09-28 DIAGNOSIS — Z803 Family history of malignant neoplasm of breast: Secondary | ICD-10-CM | POA: Insufficient documentation

## 2019-09-28 DIAGNOSIS — N186 End stage renal disease: Secondary | ICD-10-CM | POA: Diagnosis not present

## 2019-09-28 DIAGNOSIS — G8929 Other chronic pain: Secondary | ICD-10-CM | POA: Diagnosis not present

## 2019-09-28 DIAGNOSIS — Z1211 Encounter for screening for malignant neoplasm of colon: Secondary | ICD-10-CM | POA: Diagnosis not present

## 2019-09-28 DIAGNOSIS — K635 Polyp of colon: Secondary | ICD-10-CM

## 2019-09-28 DIAGNOSIS — Z9049 Acquired absence of other specified parts of digestive tract: Secondary | ICD-10-CM | POA: Diagnosis not present

## 2019-09-28 HISTORY — PX: COLONOSCOPY WITH PROPOFOL: SHX5780

## 2019-09-28 HISTORY — PX: BIOPSY: SHX5522

## 2019-09-28 HISTORY — PX: POLYPECTOMY: SHX5525

## 2019-09-28 SURGERY — COLONOSCOPY WITH PROPOFOL
Anesthesia: Monitor Anesthesia Care

## 2019-09-28 MED ORDER — SODIUM CHLORIDE 0.9 % IV SOLN: INTRAVENOUS | Status: DC

## 2019-09-28 MED ORDER — LACTATED RINGERS IV SOLN: INTRAVENOUS | Status: DC | PRN

## 2019-09-28 MED ORDER — PHENYLEPHRINE 40 MCG/ML (10ML) SYRINGE FOR IV PUSH (FOR BLOOD PRESSURE SUPPORT)
PREFILLED_SYRINGE | INTRAVENOUS | Status: DC | PRN
  Administered 2019-09-28: 80 ug via INTRAVENOUS

## 2019-09-28 MED ORDER — PROPOFOL 10 MG/ML IV BOLUS
INTRAVENOUS | Status: DC | PRN
  Administered 2019-09-28 (×2): 20 mg via INTRAVENOUS

## 2019-09-28 MED ORDER — PROPOFOL 500 MG/50ML IV EMUL
INTRAVENOUS | Status: DC | PRN
  Administered 2019-09-28: 125 ug/kg/min via INTRAVENOUS

## 2019-09-28 MED ORDER — LIDOCAINE 2% (20 MG/ML) 5 ML SYRINGE
INTRAMUSCULAR | Status: DC | PRN
  Administered 2019-09-28: 80 mg via INTRAVENOUS

## 2019-09-28 MED ORDER — FENTANYL CITRATE (PF) 100 MCG/2ML IJ SOLN
INTRAMUSCULAR | Status: AC
  Filled 2019-09-28: qty 2

## 2019-09-28 MED ORDER — PROPOFOL 10 MG/ML IV BOLUS
INTRAVENOUS | Status: AC
  Filled 2019-09-28: qty 20

## 2019-09-28 MED ORDER — MIDAZOLAM HCL 2 MG/2ML IJ SOLN
INTRAMUSCULAR | Status: AC
  Filled 2019-09-28: qty 2

## 2019-09-28 SURGICAL SUPPLY — 22 items

## 2019-09-28 NOTE — H&P (Signed)
HPI: This is 74 yo woman with history of precancerous colon polyps  Chief complaint is history of precancerous colon polyps  ROS: complete GI ROS as described in HPI, all other review negative.  Constitutional:  No unintentional weight loss   Past Medical History:  Diagnosis Date  . Anemia   . Arthritis   . Back pain, chronic   . Blood transfusion without reported diagnosis   . Cataract   . Complication of anesthesia    " I woke up during the procedure."  . ESRD (end stage renal disease) on dialysis ([place])    "TTS; [place]" (02/07/2016)  . Hyperparathyroidism, secondary ([place])   . Hypertension    off meds now  . 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 Uterine fibroid     Past Surgical History:  Procedure Laterality Date  . ARTERIOVENOUS GRAFT PLACEMENT Right 03/19/2000   upper arm  . ARTERIOVENOUS GRAFT PLACEMENT Left 12/03/2000   thigh  . CARPAL TUNNEL RELEASE Left 03/10/2013   Procedure: CARPAL TUNNEL RELEASE;  Surgeon: Wynonia Sours, MD;  Location: [place];  Service: Orthopedics;  Laterality: Left;  . CARPAL TUNNEL RELEASE Right 03/28/2015   Procedure: RIGHT CARPAL TUNNEL RELEASE;  Surgeon: Daryll Brod, MD;  Location: [place];  Service: Orthopedics;  Laterality: Right;  ANESTHESIA:  IV REGIONAL FAB  . COLONOSCOPY W/ BIOPSIES    . COLONOSCOPY WITH PROPOFOL N/A 05/14/2016   Procedure: COLONOSCOPY WITH PROPOFOL;  Surgeon: Milus Banister, MD;  Location: [facility];  Service: Endoscopy;  Laterality: N/A;  . HEMICOLECTOMY  02/1996  . HERNIA REPAIR     laparoscopic repair during a [place] hospitalization from 03/26/2006-03/30/2006  . INCISIONAL HERNIA REPAIR  04/15/2006   laparoscopic  . INSERTION OF DIALYSIS CATHETER Left 06/06/2000   IJ Quinton catheter  . INSERTION OF DIALYSIS CATHETER Right 06/28/2000   IJ Ash catheter  . INSERTION OF DIALYSIS CATHETER  Left 08/13/2000   subclavian Ash catheter  . multiple failed grafts     left thigh AVG 12/03/00, clotted -05/31/03, 01/24/04, 08/28/04, 09/06/04( thrombectomy and revision )Left AVG declot procedure including complete AV shuntogram, 08/29/04 left AV thrombolysis and angioplasty 2012 shunto gram to left thigh AVG  . REMOVAL OF A DIALYSIS CATHETER  05/31/2000   Schon catheter  . REVISION OF ARTERIOVENOUS GORETEX GRAFT Left 06/23/2012   thigh; with exc. pseudoaneurysm  . REVISION OF ARTERIOVENOUS GORETEX GRAFT Left 02/07/2016   Procedure: REVISION OF LEFT THIGH ARTERIOVENOUS GORETEX GRAFT;  Surgeon: Angelia Mould, MD;  Location: [place];  Service: Vascular;  Laterality: Left;  . SHUNTOGRAM Left 02/16/2012   thigh; with angioplasty, venous anastomosis; stent, medial graft pseudoaneurysm  . SHUNTOGRAM N/A 02/16/2012   Procedure: Earney Mallet;  Surgeon: Serafina Mitchell, MD;  Location: [facility] C[facility];  Service: Cardiovascular;  Laterality: N/A;  . THROMBECTOMY / ARTERIOVENOUS GRAFT REVISION Left 02/07/2016   thigh  . THROMBECTOMY AND REVISION OF ARTERIOVENTOUS (AV) GORETEX  GRAFT Right 06/04/2000   upper arm  . THROMBECTOMY AND REVISION OF ARTERIOVENTOUS (AV) GORETEX  GRAFT Left 09/06/2004   thigh  . TRIGGER FINGER RELEASE Right 03/28/2015   Procedure: RELEASE A-1 PULLEY RIGHT THUMB;  Surgeon: Daryll Brod, MD;  Location: [place];  Service: Orthopedics;  Laterality: Right;    Current Facility-Administered Medications  Medication Dose Route Frequency Provider Last Rate Last Admin  . 0.9 %  sodium chloride infusion   Intravenous Continuous Milus Banister, MD        Allergies as of 08/25/2019  . (No Known Allergies)    Family History  Problem Relation Age of Onset  . Colon cancer Mother 92  . Diabetes Cousin   . Breast cancer Maternal Aunt   . Breast cancer Cousin     Social History   Socioeconomic History  . Marital status: Divorced    Spouse name: Not on file  . Number of  children: 0  . Years of education: 41  . Highest education level: Not on file  Occupational History  . Occupation: Retired   Tobacco Use  . Smoking status: Former Smoker    Packs/day: 0.10    Types: Cigarettes    Quit date: 02/03/1978    Years since quitting: 41.6  . Smokeless tobacco: Never Used  Substance and Sexual Activity  . Alcohol use: Yes    Alcohol/week: 0.0 standard drinks    Comment: rare-once a year  . Drug use: No  . Sexual activity: Not on file  Other Topics Concern  . Not on file  Social History Narrative   Fun: Bowel, read, try new restaurants.    Denies abuse and feels safe at home.    Social Determinants of Health   Financial Resource Strain:   . Difficulty of Paying Living Expenses: Not on file  Food Insecurity:   . Worried About Charity fundraiser in the Last Year: Not on file  . Ran Out of Food in the Last Year: Not on file  Transportation Needs:   . Lack of Transportation (Medical): Not on file  . Lack of Transportation (Non-Medical): Not on file  Physical Activity:   . Days of Exercise per Week: Not on file  . Minutes of Exercise per Session: Not on file  Stress:   . Feeling of Stress : Not on file  Social Connections:   . Frequency of Communication with Friends and Family: Not on file  . Frequency of Social Gatherings with Friends and Family: Not on file  . Attends Religious Services: Not on file  . Active Member of Clubs or Organizations: Not on file  . Attends Archivist Meetings: Not on file  . Marital Status: Not on file  Intimate Partner Violence:   . Fear of Current or Ex-Partner: Not on file  . Emotionally Abused: Not on file  . Physically Abused: Not on file  . Sexually Abused: Not on file     Physical Exam: LMP 02/16/2012  Constitutional: generally well-appearing Psychiatric: alert and oriented x3 Abdomen: soft, nontender, nondistended, no obvious ascites, no peritoneal signs, normal bowel sounds No peripheral edema  noted in lower extremities  Assessment and plan: 74 y.o. female with history of precancerous colon polyps  For colonoscoyp today  Please see the "Patient Instructions" section for addition details about the plan.  Owens Loffler, MD [facility] 09/28/2019, 10:14 AM

## 2019-09-28 NOTE — Anesthesia Preprocedure Evaluation (Addendum)
Anesthesia Evaluation  Patient identified by MRN, date of birth, ID band Patient awake    Reviewed: Allergy & Precautions, NPO status , Patient's Chart, lab work & pertinent test results  History of Anesthesia Complications Negative for: history of anesthetic complications  Airway Mallampati: II  TM Distance: >3 FB Neck ROM: Full    Dental  (+) Poor Dentition, Missing, Loose, Chipped, Dental Advisory Given   Pulmonary former smoker,  09/25/2019 SARS CoV2 NEG   breath sounds clear to auscultation       Cardiovascular hypertension (controlled with dialysis),  Rhythm:Regular Rate:Normal  '15 ECHO: EF 65-70%, mild TR   Neuro/Psych    GI/Hepatic   Endo/Other  Morbid obesity  Renal/GU Dialysis and ESRFRenal disease (TuThSa, K+ 4.1)     Musculoskeletal  (+) Arthritis , Osteoarthritis,    Abdominal (+) + obese,   Peds  Hematology negative hematology ROS (+)   Anesthesia Other Findings   Reproductive/Obstetrics                            Anesthesia Physical Anesthesia Plan  ASA: III  Anesthesia Plan: MAC   Post-op Pain Management:    Induction:   PONV Risk Score and Plan: 2 and Treatment may vary due to age or medical condition  Airway Management Planned: Natural Airway and Simple Face Mask  Additional Equipment:   Intra-op Plan:   Post-operative Plan:   Informed Consent: I have reviewed the patients History and Physical, chart, labs and discussed the procedure including the risks, benefits and alternatives for the proposed anesthesia with the patient or authorized representative who has indicated his/her understanding and acceptance.     Dental advisory given  Plan Discussed with: CRNA and Surgeon  Anesthesia Plan Comments:        Anesthesia Quick Evaluation

## 2019-09-28 NOTE — Op Note (Signed)
[facility] Patient Name: Sue Neal Procedure Date: 09/28/2019 MRN: [id number] Attending MD: Milus Banister , MD Date of Birth: 1946/04/06 CSN: [id number] Age: 74 Admit Type: Outpatient Procedure:                Colonoscopy Indications:              High risk colon cancer surveillance: Personal                            history of colonic polyps; History of adenomatous                            colon polyps. She required a right hemicolectomy                            1997 under the direction of Dr. Cristina Gong for a very                            large polyp. Repeat colonoscopy 2001 Dr. Cristina Gong                            found a single subcentimeter adenoma. Colonoscopy                            Dr. Ardis Hughs August 2017 found a 2.4 cm                            semipedunculated polyp in her transverse colon,                            this was completely removed with snare polypectomy.                            Pathology proved to be adenomatous without                            high-grade dysplasia. Recent loose stools as well. Providers:                Milus Banister, MD, Cleda Daub, RN, Corie Chiquito, Technician, Cletis Athens, Technician, Caryl Pina CRNA Referring MD:              Medicines:                Monitored Anesthesia Care Complications:            No immediate complications. Estimated blood loss:                            None. Estimated Blood Loss:     Estimated blood loss: none. Procedure:  Pre-Anesthesia Assessment:                           - Prior to the procedure, a History and Physical                            was performed, and patient medications and                            allergies were reviewed. The patient's tolerance of                            previous anesthesia was also reviewed. The risks                            and benefits of the procedure and the  sedation                            options and risks were discussed with the patient.                            All questions were answered, and informed consent                            was obtained. Prior Anticoagulants: The patient has                            taken no previous anticoagulant or antiplatelet                            agents. ASA Grade Assessment: IV - A patient with                            severe systemic disease that is a constant threat                            to life. After reviewing the risks and benefits,                            the patient was deemed in satisfactory condition to                            undergo the procedure.                           After obtaining informed consent, the colonoscope                            was passed under direct vision. Throughout the                            procedure, the patient's blood pressure, pulse, and  oxygen saturations were monitored continuously. The                            CF-HQ190L ([id number] Olympus colonoscope was                            introduced through the anus and advanced to the the                            ileocolonic anastomosis. The colonoscopy was                            performed without difficulty. The patient tolerated                            the procedure well. The quality of the bowel                            preparation was adequate. The rectum was                            photographed. Scope In: 11:18:00 AM Scope Out: 11:34:24 AM Scope Withdrawal Time: 0 hours 10 minutes 32 seconds  Total Procedure Duration: 0 hours 16 minutes 24 seconds  Findings:      Normal appearing ileocolonic anastomosis (1997 right hemicolectomy)      Two sessile polyps were found in the sigmoid colon and transverse colon.       The polyps were 4 to 6 mm in size. These polyps were removed with a cold       snare. Resection and retrieval were complete.       The exam was otherwise without abnormality on direct and retroflexion       views.      Biopsies for histology were taken with a cold forceps from the entire       colon for evaluation of microscopic colitis. Impression:               - Two 4 to 6 mm polyps in the sigmoid colon and in                            the transverse colon, removed with a cold snare.                            Resected and retrieved.                           - Normal appearing ileocolonic anastomosis (1997                            right hemicolectomy)                           - The examination was otherwise normal on direct                            and retroflexion views.                           -  Biopsies were taken with a cold forceps from the                            entire colon for evaluation of microscopic colitis. Moderate Sedation:      Not Applicable - Patient had care per Anesthesia. Recommendation:           - Patient has a contact number available for                            emergencies. The signs and symptoms of potential                            delayed complications were discussed with the                            patient. Return to normal activities tomorrow.                            Written discharge instructions were provided to the                            patient.                           - Resume previous diet.                           - Continue present medications.                           - Await pathology results. Procedure Code(s):        --- Professional ---                           [id number], Colonoscopy, flexible; with removal of                            tumor(s), polyp(s), or other lesion(s) by snare                            technique                           45380, 23, Colonoscopy, flexible; with biopsy,                            single or multiple Diagnosis Code(s):        --- Professional ---                           Z86.010, Personal history of  colonic polyps                           K63.5, Polyp of colon CPT copyright 2019 American Medical Association. All rights reserved. The codes documented in this report are preliminary and upon coder review may  be revised to meet current  compliance requirements. Milus Banister, MD 09/28/2019 11:42:12 AM This report has been signed electronically. Number of Addenda: 0

## 2019-09-28 NOTE — Anesthesia Postprocedure Evaluation (Signed)
Anesthesia Post Note  Patient: Sue Neal  Procedure(s) Performed: COLONOSCOPY WITH PROPOFOL (N/A ) POLYPECTOMY BIOPSY     Patient location during evaluation: Endoscopy Anesthesia Type: MAC Level of consciousness: awake and alert, patient cooperative and oriented Pain management: pain level controlled Vital Signs Assessment: post-procedure vital signs reviewed and stable Respiratory status: spontaneous breathing, nonlabored ventilation and respiratory function stable Cardiovascular status: stable and blood pressure returned to baseline Postop Assessment: no apparent nausea or vomiting and able to ambulate Anesthetic complications: no    Last Vitals:  Vitals:   09/28/19 1200 09/28/19 1210  BP: (!) 122/42 (!) 122/46  Pulse: 73 71  Resp: (!) 24 (!) 25  Temp:    SpO2: 96% 96%    Last Pain:  Vitals:   09/28/19 1210  TempSrc:   PainSc: 0-No pain                 Beatric Fulop,E. Ryon Layton

## 2019-09-28 NOTE — Transfer of Care (Signed)
Immediate Anesthesia Transfer of Care Note  Patient: Sue Neal  Procedure(s) Performed: COLONOSCOPY WITH PROPOFOL (N/A ) POLYPECTOMY BIOPSY  Patient Location: PACU  Anesthesia Type:MAC  Level of Consciousness: awake, alert , oriented and patient cooperative  Airway & Oxygen Therapy: Patient Spontanous Breathing and Patient connected to face mask oxygen  Post-op Assessment: Report given to RN and Post -op Vital signs reviewed and stable  Post vital signs: Reviewed and stable  Last Vitals:  Vitals Value Taken Time  BP    Temp    Pulse    Resp    SpO2      Last Pain:  Vitals:   09/28/19 1014  TempSrc: Oral  PainSc: 0-No pain         Complications: No apparent anesthesia complications

## 2019-09-28 NOTE — Discharge Instructions (Signed)

## 2019-09-29 ENCOUNTER — Encounter: Payer: Self-pay | Admitting: *Deleted

## 2019-09-29 LAB — SURGICAL PATHOLOGY

## 2019-09-30 DIAGNOSIS — D259 Leiomyoma of uterus, unspecified: Secondary | ICD-10-CM | POA: Diagnosis not present

## 2019-09-30 DIAGNOSIS — N2581 Secondary hyperparathyroidism of renal origin: Secondary | ICD-10-CM | POA: Diagnosis not present

## 2019-09-30 DIAGNOSIS — N186 End stage renal disease: Secondary | ICD-10-CM | POA: Diagnosis not present

## 2019-09-30 DIAGNOSIS — Z992 Dependence on renal dialysis: Secondary | ICD-10-CM | POA: Diagnosis not present

## 2019-10-03 DIAGNOSIS — N2581 Secondary hyperparathyroidism of renal origin: Secondary | ICD-10-CM | POA: Diagnosis not present

## 2019-10-03 DIAGNOSIS — N186 End stage renal disease: Secondary | ICD-10-CM | POA: Diagnosis not present

## 2019-10-03 DIAGNOSIS — D259 Leiomyoma of uterus, unspecified: Secondary | ICD-10-CM | POA: Diagnosis not present

## 2019-10-03 DIAGNOSIS — Z992 Dependence on renal dialysis: Secondary | ICD-10-CM | POA: Diagnosis not present

## 2019-10-05 DIAGNOSIS — N186 End stage renal disease: Secondary | ICD-10-CM | POA: Diagnosis not present

## 2019-10-05 DIAGNOSIS — D259 Leiomyoma of uterus, unspecified: Secondary | ICD-10-CM | POA: Diagnosis not present

## 2019-10-05 DIAGNOSIS — N2581 Secondary hyperparathyroidism of renal origin: Secondary | ICD-10-CM | POA: Diagnosis not present

## 2019-10-05 DIAGNOSIS — Z992 Dependence on renal dialysis: Secondary | ICD-10-CM | POA: Diagnosis not present

## 2019-10-05 LAB — POCT I-STAT, CHEM 8
BUN: 19 mg/dL (ref 8–23)
Calcium, Ion: 0.97 mmol/L — ABNORMAL LOW (ref 1.15–1.40)
Chloride: 96 mmol/L — ABNORMAL LOW (ref 98–111)
Creatinine, Ser: 8.8 mg/dL — ABNORMAL HIGH (ref 0.44–1.00)
Glucose, Bld: 86 mg/dL (ref 70–99)
HCT: 43 % (ref 36.0–46.0)
Hemoglobin: 14.6 g/dL (ref 12.0–15.0)
Potassium: 4.1 mmol/L (ref 3.5–5.1)
Sodium: 135 mmol/L (ref 135–145)
TCO2: 30 mmol/L (ref 22–32)

## 2019-10-07 DIAGNOSIS — N186 End stage renal disease: Secondary | ICD-10-CM | POA: Diagnosis not present

## 2019-10-07 DIAGNOSIS — D259 Leiomyoma of uterus, unspecified: Secondary | ICD-10-CM | POA: Diagnosis not present

## 2019-10-07 DIAGNOSIS — Z992 Dependence on renal dialysis: Secondary | ICD-10-CM | POA: Diagnosis not present

## 2019-10-07 DIAGNOSIS — N2581 Secondary hyperparathyroidism of renal origin: Secondary | ICD-10-CM | POA: Diagnosis not present

## 2019-10-10 DIAGNOSIS — D259 Leiomyoma of uterus, unspecified: Secondary | ICD-10-CM | POA: Diagnosis not present

## 2019-10-10 DIAGNOSIS — N2581 Secondary hyperparathyroidism of renal origin: Secondary | ICD-10-CM | POA: Diagnosis not present

## 2019-10-10 DIAGNOSIS — Z992 Dependence on renal dialysis: Secondary | ICD-10-CM | POA: Diagnosis not present

## 2019-10-10 DIAGNOSIS — N186 End stage renal disease: Secondary | ICD-10-CM | POA: Diagnosis not present

## 2019-10-12 DIAGNOSIS — D259 Leiomyoma of uterus, unspecified: Secondary | ICD-10-CM | POA: Diagnosis not present

## 2019-10-12 DIAGNOSIS — Z992 Dependence on renal dialysis: Secondary | ICD-10-CM | POA: Diagnosis not present

## 2019-10-12 DIAGNOSIS — N186 End stage renal disease: Secondary | ICD-10-CM | POA: Diagnosis not present

## 2019-10-12 DIAGNOSIS — N2581 Secondary hyperparathyroidism of renal origin: Secondary | ICD-10-CM | POA: Diagnosis not present

## 2019-10-14 DIAGNOSIS — N186 End stage renal disease: Secondary | ICD-10-CM | POA: Diagnosis not present

## 2019-10-14 DIAGNOSIS — N2581 Secondary hyperparathyroidism of renal origin: Secondary | ICD-10-CM | POA: Diagnosis not present

## 2019-10-14 DIAGNOSIS — D259 Leiomyoma of uterus, unspecified: Secondary | ICD-10-CM | POA: Diagnosis not present

## 2019-10-14 DIAGNOSIS — Z992 Dependence on renal dialysis: Secondary | ICD-10-CM | POA: Diagnosis not present

## 2019-10-17 DIAGNOSIS — N2581 Secondary hyperparathyroidism of renal origin: Secondary | ICD-10-CM | POA: Diagnosis not present

## 2019-10-17 DIAGNOSIS — D259 Leiomyoma of uterus, unspecified: Secondary | ICD-10-CM | POA: Diagnosis not present

## 2019-10-17 DIAGNOSIS — Z992 Dependence on renal dialysis: Secondary | ICD-10-CM | POA: Diagnosis not present

## 2019-10-17 DIAGNOSIS — N186 End stage renal disease: Secondary | ICD-10-CM | POA: Diagnosis not present

## 2019-10-19 DIAGNOSIS — D259 Leiomyoma of uterus, unspecified: Secondary | ICD-10-CM | POA: Diagnosis not present

## 2019-10-19 DIAGNOSIS — N2581 Secondary hyperparathyroidism of renal origin: Secondary | ICD-10-CM | POA: Diagnosis not present

## 2019-10-19 DIAGNOSIS — Z992 Dependence on renal dialysis: Secondary | ICD-10-CM | POA: Diagnosis not present

## 2019-10-19 DIAGNOSIS — N186 End stage renal disease: Secondary | ICD-10-CM | POA: Diagnosis not present

## 2019-10-21 DIAGNOSIS — Z992 Dependence on renal dialysis: Secondary | ICD-10-CM | POA: Diagnosis not present

## 2019-10-21 DIAGNOSIS — N2581 Secondary hyperparathyroidism of renal origin: Secondary | ICD-10-CM | POA: Diagnosis not present

## 2019-10-21 DIAGNOSIS — D259 Leiomyoma of uterus, unspecified: Secondary | ICD-10-CM | POA: Diagnosis not present

## 2019-10-21 DIAGNOSIS — N186 End stage renal disease: Secondary | ICD-10-CM | POA: Diagnosis not present

## 2019-10-23 DIAGNOSIS — Q612 Polycystic kidney, adult type: Secondary | ICD-10-CM | POA: Diagnosis not present

## 2019-10-23 DIAGNOSIS — N186 End stage renal disease: Secondary | ICD-10-CM | POA: Diagnosis not present

## 2019-10-23 DIAGNOSIS — Z992 Dependence on renal dialysis: Secondary | ICD-10-CM | POA: Diagnosis not present

## 2019-10-24 DIAGNOSIS — D259 Leiomyoma of uterus, unspecified: Secondary | ICD-10-CM | POA: Diagnosis not present

## 2019-10-24 DIAGNOSIS — Z992 Dependence on renal dialysis: Secondary | ICD-10-CM | POA: Diagnosis not present

## 2019-10-24 DIAGNOSIS — N186 End stage renal disease: Secondary | ICD-10-CM | POA: Diagnosis not present

## 2019-10-24 DIAGNOSIS — N2581 Secondary hyperparathyroidism of renal origin: Secondary | ICD-10-CM | POA: Diagnosis not present

## 2019-10-26 DIAGNOSIS — Z992 Dependence on renal dialysis: Secondary | ICD-10-CM | POA: Diagnosis not present

## 2019-10-26 DIAGNOSIS — D259 Leiomyoma of uterus, unspecified: Secondary | ICD-10-CM | POA: Diagnosis not present

## 2019-10-26 DIAGNOSIS — N186 End stage renal disease: Secondary | ICD-10-CM | POA: Diagnosis not present

## 2019-10-26 DIAGNOSIS — N2581 Secondary hyperparathyroidism of renal origin: Secondary | ICD-10-CM | POA: Diagnosis not present

## 2019-10-28 DIAGNOSIS — D259 Leiomyoma of uterus, unspecified: Secondary | ICD-10-CM | POA: Diagnosis not present

## 2019-10-28 DIAGNOSIS — Z992 Dependence on renal dialysis: Secondary | ICD-10-CM | POA: Diagnosis not present

## 2019-10-28 DIAGNOSIS — N186 End stage renal disease: Secondary | ICD-10-CM | POA: Diagnosis not present

## 2019-10-28 DIAGNOSIS — N2581 Secondary hyperparathyroidism of renal origin: Secondary | ICD-10-CM | POA: Diagnosis not present

## 2019-10-31 DIAGNOSIS — D259 Leiomyoma of uterus, unspecified: Secondary | ICD-10-CM | POA: Diagnosis not present

## 2019-10-31 DIAGNOSIS — Z992 Dependence on renal dialysis: Secondary | ICD-10-CM | POA: Diagnosis not present

## 2019-10-31 DIAGNOSIS — N186 End stage renal disease: Secondary | ICD-10-CM | POA: Diagnosis not present

## 2019-10-31 DIAGNOSIS — N2581 Secondary hyperparathyroidism of renal origin: Secondary | ICD-10-CM | POA: Diagnosis not present

## 2019-11-02 DIAGNOSIS — D259 Leiomyoma of uterus, unspecified: Secondary | ICD-10-CM | POA: Diagnosis not present

## 2019-11-02 DIAGNOSIS — Z992 Dependence on renal dialysis: Secondary | ICD-10-CM | POA: Diagnosis not present

## 2019-11-02 DIAGNOSIS — N186 End stage renal disease: Secondary | ICD-10-CM | POA: Diagnosis not present

## 2019-11-02 DIAGNOSIS — N2581 Secondary hyperparathyroidism of renal origin: Secondary | ICD-10-CM | POA: Diagnosis not present

## 2019-11-04 DIAGNOSIS — N2581 Secondary hyperparathyroidism of renal origin: Secondary | ICD-10-CM | POA: Diagnosis not present

## 2019-11-04 DIAGNOSIS — N186 End stage renal disease: Secondary | ICD-10-CM | POA: Diagnosis not present

## 2019-11-04 DIAGNOSIS — Z992 Dependence on renal dialysis: Secondary | ICD-10-CM | POA: Diagnosis not present

## 2019-11-04 DIAGNOSIS — D259 Leiomyoma of uterus, unspecified: Secondary | ICD-10-CM | POA: Diagnosis not present

## 2019-11-07 DIAGNOSIS — N186 End stage renal disease: Secondary | ICD-10-CM | POA: Diagnosis not present

## 2019-11-07 DIAGNOSIS — Z992 Dependence on renal dialysis: Secondary | ICD-10-CM | POA: Diagnosis not present

## 2019-11-07 DIAGNOSIS — N2581 Secondary hyperparathyroidism of renal origin: Secondary | ICD-10-CM | POA: Diagnosis not present

## 2019-11-07 DIAGNOSIS — D259 Leiomyoma of uterus, unspecified: Secondary | ICD-10-CM | POA: Diagnosis not present

## 2019-11-08 DIAGNOSIS — H2513 Age-related nuclear cataract, bilateral: Secondary | ICD-10-CM | POA: Diagnosis not present

## 2019-11-08 DIAGNOSIS — H25043 Posterior subcapsular polar age-related cataract, bilateral: Secondary | ICD-10-CM | POA: Diagnosis not present

## 2019-11-08 DIAGNOSIS — H25013 Cortical age-related cataract, bilateral: Secondary | ICD-10-CM | POA: Diagnosis not present

## 2019-11-11 DIAGNOSIS — D259 Leiomyoma of uterus, unspecified: Secondary | ICD-10-CM | POA: Diagnosis not present

## 2019-11-11 DIAGNOSIS — N2581 Secondary hyperparathyroidism of renal origin: Secondary | ICD-10-CM | POA: Diagnosis not present

## 2019-11-11 DIAGNOSIS — Z992 Dependence on renal dialysis: Secondary | ICD-10-CM | POA: Diagnosis not present

## 2019-11-11 DIAGNOSIS — N186 End stage renal disease: Secondary | ICD-10-CM | POA: Diagnosis not present

## 2019-11-14 DIAGNOSIS — D259 Leiomyoma of uterus, unspecified: Secondary | ICD-10-CM | POA: Diagnosis not present

## 2019-11-14 DIAGNOSIS — N186 End stage renal disease: Secondary | ICD-10-CM | POA: Diagnosis not present

## 2019-11-14 DIAGNOSIS — N2581 Secondary hyperparathyroidism of renal origin: Secondary | ICD-10-CM | POA: Diagnosis not present

## 2019-11-14 DIAGNOSIS — Z992 Dependence on renal dialysis: Secondary | ICD-10-CM | POA: Diagnosis not present

## 2019-11-16 DIAGNOSIS — N2581 Secondary hyperparathyroidism of renal origin: Secondary | ICD-10-CM | POA: Diagnosis not present

## 2019-11-16 DIAGNOSIS — N186 End stage renal disease: Secondary | ICD-10-CM | POA: Diagnosis not present

## 2019-11-16 DIAGNOSIS — D259 Leiomyoma of uterus, unspecified: Secondary | ICD-10-CM | POA: Diagnosis not present

## 2019-11-16 DIAGNOSIS — Z992 Dependence on renal dialysis: Secondary | ICD-10-CM | POA: Diagnosis not present

## 2019-11-18 DIAGNOSIS — N2581 Secondary hyperparathyroidism of renal origin: Secondary | ICD-10-CM | POA: Diagnosis not present

## 2019-11-18 DIAGNOSIS — Z992 Dependence on renal dialysis: Secondary | ICD-10-CM | POA: Diagnosis not present

## 2019-11-18 DIAGNOSIS — D259 Leiomyoma of uterus, unspecified: Secondary | ICD-10-CM | POA: Diagnosis not present

## 2019-11-18 DIAGNOSIS — N186 End stage renal disease: Secondary | ICD-10-CM | POA: Diagnosis not present

## 2019-11-20 DIAGNOSIS — N186 End stage renal disease: Secondary | ICD-10-CM | POA: Diagnosis not present

## 2019-11-20 DIAGNOSIS — Q612 Polycystic kidney, adult type: Secondary | ICD-10-CM | POA: Diagnosis not present

## 2019-11-20 DIAGNOSIS — Z992 Dependence on renal dialysis: Secondary | ICD-10-CM | POA: Diagnosis not present

## 2019-11-21 DIAGNOSIS — D509 Iron deficiency anemia, unspecified: Secondary | ICD-10-CM | POA: Diagnosis not present

## 2019-11-21 DIAGNOSIS — N2581 Secondary hyperparathyroidism of renal origin: Secondary | ICD-10-CM | POA: Diagnosis not present

## 2019-11-21 DIAGNOSIS — N186 End stage renal disease: Secondary | ICD-10-CM | POA: Diagnosis not present

## 2019-11-21 DIAGNOSIS — Z992 Dependence on renal dialysis: Secondary | ICD-10-CM | POA: Diagnosis not present

## 2019-11-21 DIAGNOSIS — D259 Leiomyoma of uterus, unspecified: Secondary | ICD-10-CM | POA: Diagnosis not present

## 2019-11-25 DIAGNOSIS — D259 Leiomyoma of uterus, unspecified: Secondary | ICD-10-CM | POA: Diagnosis not present

## 2019-11-25 DIAGNOSIS — N2581 Secondary hyperparathyroidism of renal origin: Secondary | ICD-10-CM | POA: Diagnosis not present

## 2019-11-25 DIAGNOSIS — N186 End stage renal disease: Secondary | ICD-10-CM | POA: Diagnosis not present

## 2019-11-25 DIAGNOSIS — D509 Iron deficiency anemia, unspecified: Secondary | ICD-10-CM | POA: Diagnosis not present

## 2019-11-25 DIAGNOSIS — Z992 Dependence on renal dialysis: Secondary | ICD-10-CM | POA: Diagnosis not present

## 2019-11-28 DIAGNOSIS — D509 Iron deficiency anemia, unspecified: Secondary | ICD-10-CM | POA: Diagnosis not present

## 2019-11-28 DIAGNOSIS — N186 End stage renal disease: Secondary | ICD-10-CM | POA: Diagnosis not present

## 2019-11-28 DIAGNOSIS — Z992 Dependence on renal dialysis: Secondary | ICD-10-CM | POA: Diagnosis not present

## 2019-11-28 DIAGNOSIS — D259 Leiomyoma of uterus, unspecified: Secondary | ICD-10-CM | POA: Diagnosis not present

## 2019-11-28 DIAGNOSIS — N2581 Secondary hyperparathyroidism of renal origin: Secondary | ICD-10-CM | POA: Diagnosis not present

## 2019-11-30 DIAGNOSIS — N186 End stage renal disease: Secondary | ICD-10-CM | POA: Diagnosis not present

## 2019-11-30 DIAGNOSIS — Z992 Dependence on renal dialysis: Secondary | ICD-10-CM | POA: Diagnosis not present

## 2019-11-30 DIAGNOSIS — D259 Leiomyoma of uterus, unspecified: Secondary | ICD-10-CM | POA: Diagnosis not present

## 2019-11-30 DIAGNOSIS — N2581 Secondary hyperparathyroidism of renal origin: Secondary | ICD-10-CM | POA: Diagnosis not present

## 2019-11-30 DIAGNOSIS — D509 Iron deficiency anemia, unspecified: Secondary | ICD-10-CM | POA: Diagnosis not present

## 2019-12-02 DIAGNOSIS — Z992 Dependence on renal dialysis: Secondary | ICD-10-CM | POA: Diagnosis not present

## 2019-12-02 DIAGNOSIS — D259 Leiomyoma of uterus, unspecified: Secondary | ICD-10-CM | POA: Diagnosis not present

## 2019-12-02 DIAGNOSIS — N2581 Secondary hyperparathyroidism of renal origin: Secondary | ICD-10-CM | POA: Diagnosis not present

## 2019-12-02 DIAGNOSIS — N186 End stage renal disease: Secondary | ICD-10-CM | POA: Diagnosis not present

## 2019-12-02 DIAGNOSIS — D509 Iron deficiency anemia, unspecified: Secondary | ICD-10-CM | POA: Diagnosis not present

## 2019-12-05 DIAGNOSIS — D259 Leiomyoma of uterus, unspecified: Secondary | ICD-10-CM | POA: Diagnosis not present

## 2019-12-05 DIAGNOSIS — N186 End stage renal disease: Secondary | ICD-10-CM | POA: Diagnosis not present

## 2019-12-05 DIAGNOSIS — Z992 Dependence on renal dialysis: Secondary | ICD-10-CM | POA: Diagnosis not present

## 2019-12-05 DIAGNOSIS — N2581 Secondary hyperparathyroidism of renal origin: Secondary | ICD-10-CM | POA: Diagnosis not present

## 2019-12-05 DIAGNOSIS — D509 Iron deficiency anemia, unspecified: Secondary | ICD-10-CM | POA: Diagnosis not present

## 2019-12-06 DIAGNOSIS — H04123 Dry eye syndrome of bilateral lacrimal glands: Secondary | ICD-10-CM | POA: Diagnosis not present

## 2019-12-06 DIAGNOSIS — H1045 Other chronic allergic conjunctivitis: Secondary | ICD-10-CM | POA: Diagnosis not present

## 2019-12-06 DIAGNOSIS — H25813 Combined forms of age-related cataract, bilateral: Secondary | ICD-10-CM | POA: Diagnosis not present

## 2019-12-09 DIAGNOSIS — N186 End stage renal disease: Secondary | ICD-10-CM | POA: Diagnosis not present

## 2019-12-09 DIAGNOSIS — D509 Iron deficiency anemia, unspecified: Secondary | ICD-10-CM | POA: Diagnosis not present

## 2019-12-09 DIAGNOSIS — N2581 Secondary hyperparathyroidism of renal origin: Secondary | ICD-10-CM | POA: Diagnosis not present

## 2019-12-09 DIAGNOSIS — D259 Leiomyoma of uterus, unspecified: Secondary | ICD-10-CM | POA: Diagnosis not present

## 2019-12-09 DIAGNOSIS — Z992 Dependence on renal dialysis: Secondary | ICD-10-CM | POA: Diagnosis not present

## 2019-12-12 DIAGNOSIS — D509 Iron deficiency anemia, unspecified: Secondary | ICD-10-CM | POA: Diagnosis not present

## 2019-12-12 DIAGNOSIS — N186 End stage renal disease: Secondary | ICD-10-CM | POA: Diagnosis not present

## 2019-12-12 DIAGNOSIS — Z992 Dependence on renal dialysis: Secondary | ICD-10-CM | POA: Diagnosis not present

## 2019-12-12 DIAGNOSIS — D259 Leiomyoma of uterus, unspecified: Secondary | ICD-10-CM | POA: Diagnosis not present

## 2019-12-12 DIAGNOSIS — N2581 Secondary hyperparathyroidism of renal origin: Secondary | ICD-10-CM | POA: Diagnosis not present

## 2019-12-16 DIAGNOSIS — D509 Iron deficiency anemia, unspecified: Secondary | ICD-10-CM | POA: Diagnosis not present

## 2019-12-16 DIAGNOSIS — N186 End stage renal disease: Secondary | ICD-10-CM | POA: Diagnosis not present

## 2019-12-16 DIAGNOSIS — Z992 Dependence on renal dialysis: Secondary | ICD-10-CM | POA: Diagnosis not present

## 2019-12-16 DIAGNOSIS — N2581 Secondary hyperparathyroidism of renal origin: Secondary | ICD-10-CM | POA: Diagnosis not present

## 2019-12-16 DIAGNOSIS — D259 Leiomyoma of uterus, unspecified: Secondary | ICD-10-CM | POA: Diagnosis not present

## 2019-12-19 DIAGNOSIS — Z992 Dependence on renal dialysis: Secondary | ICD-10-CM | POA: Diagnosis not present

## 2019-12-19 DIAGNOSIS — D509 Iron deficiency anemia, unspecified: Secondary | ICD-10-CM | POA: Diagnosis not present

## 2019-12-19 DIAGNOSIS — N186 End stage renal disease: Secondary | ICD-10-CM | POA: Diagnosis not present

## 2019-12-19 DIAGNOSIS — N2581 Secondary hyperparathyroidism of renal origin: Secondary | ICD-10-CM | POA: Diagnosis not present

## 2019-12-19 DIAGNOSIS — D259 Leiomyoma of uterus, unspecified: Secondary | ICD-10-CM | POA: Diagnosis not present

## 2019-12-21 DIAGNOSIS — Z992 Dependence on renal dialysis: Secondary | ICD-10-CM | POA: Diagnosis not present

## 2019-12-21 DIAGNOSIS — D259 Leiomyoma of uterus, unspecified: Secondary | ICD-10-CM | POA: Diagnosis not present

## 2019-12-21 DIAGNOSIS — N2581 Secondary hyperparathyroidism of renal origin: Secondary | ICD-10-CM | POA: Diagnosis not present

## 2019-12-21 DIAGNOSIS — D509 Iron deficiency anemia, unspecified: Secondary | ICD-10-CM | POA: Diagnosis not present

## 2019-12-21 DIAGNOSIS — N186 End stage renal disease: Secondary | ICD-10-CM | POA: Diagnosis not present

## 2019-12-21 DIAGNOSIS — Q612 Polycystic kidney, adult type: Secondary | ICD-10-CM | POA: Diagnosis not present

## 2019-12-23 DIAGNOSIS — D509 Iron deficiency anemia, unspecified: Secondary | ICD-10-CM | POA: Diagnosis not present

## 2019-12-23 DIAGNOSIS — N2581 Secondary hyperparathyroidism of renal origin: Secondary | ICD-10-CM | POA: Diagnosis not present

## 2019-12-23 DIAGNOSIS — Z992 Dependence on renal dialysis: Secondary | ICD-10-CM | POA: Diagnosis not present

## 2019-12-23 DIAGNOSIS — N186 End stage renal disease: Secondary | ICD-10-CM | POA: Diagnosis not present

## 2019-12-23 DIAGNOSIS — D259 Leiomyoma of uterus, unspecified: Secondary | ICD-10-CM | POA: Diagnosis not present

## 2019-12-26 DIAGNOSIS — N186 End stage renal disease: Secondary | ICD-10-CM | POA: Diagnosis not present

## 2019-12-26 DIAGNOSIS — D509 Iron deficiency anemia, unspecified: Secondary | ICD-10-CM | POA: Diagnosis not present

## 2019-12-26 DIAGNOSIS — D259 Leiomyoma of uterus, unspecified: Secondary | ICD-10-CM | POA: Diagnosis not present

## 2019-12-26 DIAGNOSIS — Z992 Dependence on renal dialysis: Secondary | ICD-10-CM | POA: Diagnosis not present

## 2019-12-26 DIAGNOSIS — N2581 Secondary hyperparathyroidism of renal origin: Secondary | ICD-10-CM | POA: Diagnosis not present

## 2019-12-28 DIAGNOSIS — Z992 Dependence on renal dialysis: Secondary | ICD-10-CM | POA: Diagnosis not present

## 2019-12-28 DIAGNOSIS — N186 End stage renal disease: Secondary | ICD-10-CM | POA: Diagnosis not present

## 2019-12-28 DIAGNOSIS — D509 Iron deficiency anemia, unspecified: Secondary | ICD-10-CM | POA: Diagnosis not present

## 2019-12-28 DIAGNOSIS — N2581 Secondary hyperparathyroidism of renal origin: Secondary | ICD-10-CM | POA: Diagnosis not present

## 2019-12-28 DIAGNOSIS — D259 Leiomyoma of uterus, unspecified: Secondary | ICD-10-CM | POA: Diagnosis not present

## 2019-12-30 DIAGNOSIS — N186 End stage renal disease: Secondary | ICD-10-CM | POA: Diagnosis not present

## 2019-12-30 DIAGNOSIS — D259 Leiomyoma of uterus, unspecified: Secondary | ICD-10-CM | POA: Diagnosis not present

## 2019-12-30 DIAGNOSIS — N2581 Secondary hyperparathyroidism of renal origin: Secondary | ICD-10-CM | POA: Diagnosis not present

## 2019-12-30 DIAGNOSIS — Z992 Dependence on renal dialysis: Secondary | ICD-10-CM | POA: Diagnosis not present

## 2019-12-30 DIAGNOSIS — D509 Iron deficiency anemia, unspecified: Secondary | ICD-10-CM | POA: Diagnosis not present

## 2020-01-02 DIAGNOSIS — D259 Leiomyoma of uterus, unspecified: Secondary | ICD-10-CM | POA: Diagnosis not present

## 2020-01-02 DIAGNOSIS — Z992 Dependence on renal dialysis: Secondary | ICD-10-CM | POA: Diagnosis not present

## 2020-01-02 DIAGNOSIS — N2581 Secondary hyperparathyroidism of renal origin: Secondary | ICD-10-CM | POA: Diagnosis not present

## 2020-01-02 DIAGNOSIS — N186 End stage renal disease: Secondary | ICD-10-CM | POA: Diagnosis not present

## 2020-01-02 DIAGNOSIS — D509 Iron deficiency anemia, unspecified: Secondary | ICD-10-CM | POA: Diagnosis not present

## 2020-01-04 DIAGNOSIS — D259 Leiomyoma of uterus, unspecified: Secondary | ICD-10-CM | POA: Diagnosis not present

## 2020-01-04 DIAGNOSIS — Z992 Dependence on renal dialysis: Secondary | ICD-10-CM | POA: Diagnosis not present

## 2020-01-04 DIAGNOSIS — N186 End stage renal disease: Secondary | ICD-10-CM | POA: Diagnosis not present

## 2020-01-04 DIAGNOSIS — D509 Iron deficiency anemia, unspecified: Secondary | ICD-10-CM | POA: Diagnosis not present

## 2020-01-04 DIAGNOSIS — N2581 Secondary hyperparathyroidism of renal origin: Secondary | ICD-10-CM | POA: Diagnosis not present

## 2020-01-06 DIAGNOSIS — N186 End stage renal disease: Secondary | ICD-10-CM | POA: Diagnosis not present

## 2020-01-06 DIAGNOSIS — N2581 Secondary hyperparathyroidism of renal origin: Secondary | ICD-10-CM | POA: Diagnosis not present

## 2020-01-06 DIAGNOSIS — Z992 Dependence on renal dialysis: Secondary | ICD-10-CM | POA: Diagnosis not present

## 2020-01-06 DIAGNOSIS — D259 Leiomyoma of uterus, unspecified: Secondary | ICD-10-CM | POA: Diagnosis not present

## 2020-01-06 DIAGNOSIS — D509 Iron deficiency anemia, unspecified: Secondary | ICD-10-CM | POA: Diagnosis not present

## 2020-01-09 DIAGNOSIS — D509 Iron deficiency anemia, unspecified: Secondary | ICD-10-CM | POA: Diagnosis not present

## 2020-01-09 DIAGNOSIS — N2581 Secondary hyperparathyroidism of renal origin: Secondary | ICD-10-CM | POA: Diagnosis not present

## 2020-01-09 DIAGNOSIS — N186 End stage renal disease: Secondary | ICD-10-CM | POA: Diagnosis not present

## 2020-01-09 DIAGNOSIS — Z992 Dependence on renal dialysis: Secondary | ICD-10-CM | POA: Diagnosis not present

## 2020-01-09 DIAGNOSIS — D259 Leiomyoma of uterus, unspecified: Secondary | ICD-10-CM | POA: Diagnosis not present

## 2020-01-11 DIAGNOSIS — Z992 Dependence on renal dialysis: Secondary | ICD-10-CM | POA: Diagnosis not present

## 2020-01-11 DIAGNOSIS — N186 End stage renal disease: Secondary | ICD-10-CM | POA: Diagnosis not present

## 2020-01-11 DIAGNOSIS — D509 Iron deficiency anemia, unspecified: Secondary | ICD-10-CM | POA: Diagnosis not present

## 2020-01-11 DIAGNOSIS — N2581 Secondary hyperparathyroidism of renal origin: Secondary | ICD-10-CM | POA: Diagnosis not present

## 2020-01-11 DIAGNOSIS — D259 Leiomyoma of uterus, unspecified: Secondary | ICD-10-CM | POA: Diagnosis not present

## 2020-01-13 DIAGNOSIS — D259 Leiomyoma of uterus, unspecified: Secondary | ICD-10-CM | POA: Diagnosis not present

## 2020-01-13 DIAGNOSIS — D509 Iron deficiency anemia, unspecified: Secondary | ICD-10-CM | POA: Diagnosis not present

## 2020-01-13 DIAGNOSIS — N186 End stage renal disease: Secondary | ICD-10-CM | POA: Diagnosis not present

## 2020-01-13 DIAGNOSIS — N2581 Secondary hyperparathyroidism of renal origin: Secondary | ICD-10-CM | POA: Diagnosis not present

## 2020-01-13 DIAGNOSIS — Z992 Dependence on renal dialysis: Secondary | ICD-10-CM | POA: Diagnosis not present

## 2020-01-16 DIAGNOSIS — D259 Leiomyoma of uterus, unspecified: Secondary | ICD-10-CM | POA: Diagnosis not present

## 2020-01-16 DIAGNOSIS — Z992 Dependence on renal dialysis: Secondary | ICD-10-CM | POA: Diagnosis not present

## 2020-01-16 DIAGNOSIS — D509 Iron deficiency anemia, unspecified: Secondary | ICD-10-CM | POA: Diagnosis not present

## 2020-01-16 DIAGNOSIS — N2581 Secondary hyperparathyroidism of renal origin: Secondary | ICD-10-CM | POA: Diagnosis not present

## 2020-01-16 DIAGNOSIS — N186 End stage renal disease: Secondary | ICD-10-CM | POA: Diagnosis not present

## 2020-01-18 DIAGNOSIS — Z992 Dependence on renal dialysis: Secondary | ICD-10-CM | POA: Diagnosis not present

## 2020-01-18 DIAGNOSIS — D509 Iron deficiency anemia, unspecified: Secondary | ICD-10-CM | POA: Diagnosis not present

## 2020-01-18 DIAGNOSIS — N2581 Secondary hyperparathyroidism of renal origin: Secondary | ICD-10-CM | POA: Diagnosis not present

## 2020-01-18 DIAGNOSIS — D259 Leiomyoma of uterus, unspecified: Secondary | ICD-10-CM | POA: Diagnosis not present

## 2020-01-18 DIAGNOSIS — N186 End stage renal disease: Secondary | ICD-10-CM | POA: Diagnosis not present

## 2020-01-20 DIAGNOSIS — N2581 Secondary hyperparathyroidism of renal origin: Secondary | ICD-10-CM | POA: Diagnosis not present

## 2020-01-20 DIAGNOSIS — N186 End stage renal disease: Secondary | ICD-10-CM | POA: Diagnosis not present

## 2020-01-20 DIAGNOSIS — Q612 Polycystic kidney, adult type: Secondary | ICD-10-CM | POA: Diagnosis not present

## 2020-01-20 DIAGNOSIS — Z992 Dependence on renal dialysis: Secondary | ICD-10-CM | POA: Diagnosis not present

## 2020-01-20 DIAGNOSIS — D259 Leiomyoma of uterus, unspecified: Secondary | ICD-10-CM | POA: Diagnosis not present

## 2020-01-23 DIAGNOSIS — N186 End stage renal disease: Secondary | ICD-10-CM | POA: Diagnosis not present

## 2020-01-23 DIAGNOSIS — Z992 Dependence on renal dialysis: Secondary | ICD-10-CM | POA: Diagnosis not present

## 2020-01-23 DIAGNOSIS — D259 Leiomyoma of uterus, unspecified: Secondary | ICD-10-CM | POA: Diagnosis not present

## 2020-01-23 DIAGNOSIS — N2581 Secondary hyperparathyroidism of renal origin: Secondary | ICD-10-CM | POA: Diagnosis not present

## 2020-01-25 DIAGNOSIS — N186 End stage renal disease: Secondary | ICD-10-CM | POA: Diagnosis not present

## 2020-01-25 DIAGNOSIS — Z992 Dependence on renal dialysis: Secondary | ICD-10-CM | POA: Diagnosis not present

## 2020-01-25 DIAGNOSIS — D259 Leiomyoma of uterus, unspecified: Secondary | ICD-10-CM | POA: Diagnosis not present

## 2020-01-25 DIAGNOSIS — N2581 Secondary hyperparathyroidism of renal origin: Secondary | ICD-10-CM | POA: Diagnosis not present

## 2020-01-27 DIAGNOSIS — Z992 Dependence on renal dialysis: Secondary | ICD-10-CM | POA: Diagnosis not present

## 2020-01-27 DIAGNOSIS — N186 End stage renal disease: Secondary | ICD-10-CM | POA: Diagnosis not present

## 2020-01-27 DIAGNOSIS — D259 Leiomyoma of uterus, unspecified: Secondary | ICD-10-CM | POA: Diagnosis not present

## 2020-01-27 DIAGNOSIS — N2581 Secondary hyperparathyroidism of renal origin: Secondary | ICD-10-CM | POA: Diagnosis not present

## 2020-01-30 DIAGNOSIS — Z992 Dependence on renal dialysis: Secondary | ICD-10-CM | POA: Diagnosis not present

## 2020-01-30 DIAGNOSIS — N2581 Secondary hyperparathyroidism of renal origin: Secondary | ICD-10-CM | POA: Diagnosis not present

## 2020-01-30 DIAGNOSIS — D259 Leiomyoma of uterus, unspecified: Secondary | ICD-10-CM | POA: Diagnosis not present

## 2020-01-30 DIAGNOSIS — N186 End stage renal disease: Secondary | ICD-10-CM | POA: Diagnosis not present

## 2020-02-01 DIAGNOSIS — N186 End stage renal disease: Secondary | ICD-10-CM | POA: Diagnosis not present

## 2020-02-01 DIAGNOSIS — D259 Leiomyoma of uterus, unspecified: Secondary | ICD-10-CM | POA: Diagnosis not present

## 2020-02-01 DIAGNOSIS — N2581 Secondary hyperparathyroidism of renal origin: Secondary | ICD-10-CM | POA: Diagnosis not present

## 2020-02-01 DIAGNOSIS — Z992 Dependence on renal dialysis: Secondary | ICD-10-CM | POA: Diagnosis not present

## 2020-02-03 DIAGNOSIS — N186 End stage renal disease: Secondary | ICD-10-CM | POA: Diagnosis not present

## 2020-02-03 DIAGNOSIS — Z992 Dependence on renal dialysis: Secondary | ICD-10-CM | POA: Diagnosis not present

## 2020-02-03 DIAGNOSIS — D259 Leiomyoma of uterus, unspecified: Secondary | ICD-10-CM | POA: Diagnosis not present

## 2020-02-03 DIAGNOSIS — Z23 Encounter for immunization: Secondary | ICD-10-CM | POA: Diagnosis not present

## 2020-02-03 DIAGNOSIS — N2581 Secondary hyperparathyroidism of renal origin: Secondary | ICD-10-CM | POA: Diagnosis not present

## 2020-02-06 DIAGNOSIS — Z992 Dependence on renal dialysis: Secondary | ICD-10-CM | POA: Diagnosis not present

## 2020-02-06 DIAGNOSIS — D259 Leiomyoma of uterus, unspecified: Secondary | ICD-10-CM | POA: Diagnosis not present

## 2020-02-06 DIAGNOSIS — N2581 Secondary hyperparathyroidism of renal origin: Secondary | ICD-10-CM | POA: Diagnosis not present

## 2020-02-06 DIAGNOSIS — N186 End stage renal disease: Secondary | ICD-10-CM | POA: Diagnosis not present

## 2020-02-08 DIAGNOSIS — N2581 Secondary hyperparathyroidism of renal origin: Secondary | ICD-10-CM | POA: Diagnosis not present

## 2020-02-08 DIAGNOSIS — Z992 Dependence on renal dialysis: Secondary | ICD-10-CM | POA: Diagnosis not present

## 2020-02-08 DIAGNOSIS — N186 End stage renal disease: Secondary | ICD-10-CM | POA: Diagnosis not present

## 2020-02-08 DIAGNOSIS — D259 Leiomyoma of uterus, unspecified: Secondary | ICD-10-CM | POA: Diagnosis not present

## 2020-02-10 DIAGNOSIS — N186 End stage renal disease: Secondary | ICD-10-CM | POA: Diagnosis not present

## 2020-02-10 DIAGNOSIS — Z992 Dependence on renal dialysis: Secondary | ICD-10-CM | POA: Diagnosis not present

## 2020-02-10 DIAGNOSIS — D259 Leiomyoma of uterus, unspecified: Secondary | ICD-10-CM | POA: Diagnosis not present

## 2020-02-10 DIAGNOSIS — N2581 Secondary hyperparathyroidism of renal origin: Secondary | ICD-10-CM | POA: Diagnosis not present

## 2020-02-13 DIAGNOSIS — Z992 Dependence on renal dialysis: Secondary | ICD-10-CM | POA: Diagnosis not present

## 2020-02-13 DIAGNOSIS — D259 Leiomyoma of uterus, unspecified: Secondary | ICD-10-CM | POA: Diagnosis not present

## 2020-02-13 DIAGNOSIS — N2581 Secondary hyperparathyroidism of renal origin: Secondary | ICD-10-CM | POA: Diagnosis not present

## 2020-02-13 DIAGNOSIS — N186 End stage renal disease: Secondary | ICD-10-CM | POA: Diagnosis not present

## 2020-02-15 DIAGNOSIS — N2581 Secondary hyperparathyroidism of renal origin: Secondary | ICD-10-CM | POA: Diagnosis not present

## 2020-02-15 DIAGNOSIS — D259 Leiomyoma of uterus, unspecified: Secondary | ICD-10-CM | POA: Diagnosis not present

## 2020-02-15 DIAGNOSIS — Z992 Dependence on renal dialysis: Secondary | ICD-10-CM | POA: Diagnosis not present

## 2020-02-15 DIAGNOSIS — N186 End stage renal disease: Secondary | ICD-10-CM | POA: Diagnosis not present

## 2020-02-17 DIAGNOSIS — Z992 Dependence on renal dialysis: Secondary | ICD-10-CM | POA: Diagnosis not present

## 2020-02-17 DIAGNOSIS — N186 End stage renal disease: Secondary | ICD-10-CM | POA: Diagnosis not present

## 2020-02-17 DIAGNOSIS — D259 Leiomyoma of uterus, unspecified: Secondary | ICD-10-CM | POA: Diagnosis not present

## 2020-02-17 DIAGNOSIS — N2581 Secondary hyperparathyroidism of renal origin: Secondary | ICD-10-CM | POA: Diagnosis not present

## 2020-02-20 DIAGNOSIS — D259 Leiomyoma of uterus, unspecified: Secondary | ICD-10-CM | POA: Diagnosis not present

## 2020-02-20 DIAGNOSIS — N2581 Secondary hyperparathyroidism of renal origin: Secondary | ICD-10-CM | POA: Diagnosis not present

## 2020-02-20 DIAGNOSIS — Z992 Dependence on renal dialysis: Secondary | ICD-10-CM | POA: Diagnosis not present

## 2020-02-20 DIAGNOSIS — N186 End stage renal disease: Secondary | ICD-10-CM | POA: Diagnosis not present

## 2020-02-20 DIAGNOSIS — Q612 Polycystic kidney, adult type: Secondary | ICD-10-CM | POA: Diagnosis not present

## 2020-02-22 DIAGNOSIS — N186 End stage renal disease: Secondary | ICD-10-CM | POA: Diagnosis not present

## 2020-02-22 DIAGNOSIS — D259 Leiomyoma of uterus, unspecified: Secondary | ICD-10-CM | POA: Diagnosis not present

## 2020-02-22 DIAGNOSIS — Z992 Dependence on renal dialysis: Secondary | ICD-10-CM | POA: Diagnosis not present

## 2020-02-22 DIAGNOSIS — N2581 Secondary hyperparathyroidism of renal origin: Secondary | ICD-10-CM | POA: Diagnosis not present

## 2020-02-24 DIAGNOSIS — N2581 Secondary hyperparathyroidism of renal origin: Secondary | ICD-10-CM | POA: Diagnosis not present

## 2020-02-24 DIAGNOSIS — N186 End stage renal disease: Secondary | ICD-10-CM | POA: Diagnosis not present

## 2020-02-24 DIAGNOSIS — Z23 Encounter for immunization: Secondary | ICD-10-CM | POA: Diagnosis not present

## 2020-02-24 DIAGNOSIS — D259 Leiomyoma of uterus, unspecified: Secondary | ICD-10-CM | POA: Diagnosis not present

## 2020-02-24 DIAGNOSIS — Z992 Dependence on renal dialysis: Secondary | ICD-10-CM | POA: Diagnosis not present

## 2020-02-27 DIAGNOSIS — N186 End stage renal disease: Secondary | ICD-10-CM | POA: Diagnosis not present

## 2020-02-27 DIAGNOSIS — Z992 Dependence on renal dialysis: Secondary | ICD-10-CM | POA: Diagnosis not present

## 2020-02-27 DIAGNOSIS — D259 Leiomyoma of uterus, unspecified: Secondary | ICD-10-CM | POA: Diagnosis not present

## 2020-02-27 DIAGNOSIS — N2581 Secondary hyperparathyroidism of renal origin: Secondary | ICD-10-CM | POA: Diagnosis not present

## 2020-02-29 DIAGNOSIS — Z992 Dependence on renal dialysis: Secondary | ICD-10-CM | POA: Diagnosis not present

## 2020-02-29 DIAGNOSIS — N186 End stage renal disease: Secondary | ICD-10-CM | POA: Diagnosis not present

## 2020-02-29 DIAGNOSIS — D259 Leiomyoma of uterus, unspecified: Secondary | ICD-10-CM | POA: Diagnosis not present

## 2020-02-29 DIAGNOSIS — N2581 Secondary hyperparathyroidism of renal origin: Secondary | ICD-10-CM | POA: Diagnosis not present

## 2020-03-02 DIAGNOSIS — N186 End stage renal disease: Secondary | ICD-10-CM | POA: Diagnosis not present

## 2020-03-02 DIAGNOSIS — D259 Leiomyoma of uterus, unspecified: Secondary | ICD-10-CM | POA: Diagnosis not present

## 2020-03-02 DIAGNOSIS — N2581 Secondary hyperparathyroidism of renal origin: Secondary | ICD-10-CM | POA: Diagnosis not present

## 2020-03-02 DIAGNOSIS — Z992 Dependence on renal dialysis: Secondary | ICD-10-CM | POA: Diagnosis not present

## 2020-03-05 DIAGNOSIS — D259 Leiomyoma of uterus, unspecified: Secondary | ICD-10-CM | POA: Diagnosis not present

## 2020-03-05 DIAGNOSIS — N186 End stage renal disease: Secondary | ICD-10-CM | POA: Diagnosis not present

## 2020-03-05 DIAGNOSIS — Z992 Dependence on renal dialysis: Secondary | ICD-10-CM | POA: Diagnosis not present

## 2020-03-05 DIAGNOSIS — N2581 Secondary hyperparathyroidism of renal origin: Secondary | ICD-10-CM | POA: Diagnosis not present

## 2020-03-07 DIAGNOSIS — N186 End stage renal disease: Secondary | ICD-10-CM | POA: Diagnosis not present

## 2020-03-07 DIAGNOSIS — D259 Leiomyoma of uterus, unspecified: Secondary | ICD-10-CM | POA: Diagnosis not present

## 2020-03-07 DIAGNOSIS — Z992 Dependence on renal dialysis: Secondary | ICD-10-CM | POA: Diagnosis not present

## 2020-03-07 DIAGNOSIS — N2581 Secondary hyperparathyroidism of renal origin: Secondary | ICD-10-CM | POA: Diagnosis not present

## 2020-03-09 DIAGNOSIS — D259 Leiomyoma of uterus, unspecified: Secondary | ICD-10-CM | POA: Diagnosis not present

## 2020-03-09 DIAGNOSIS — Z992 Dependence on renal dialysis: Secondary | ICD-10-CM | POA: Diagnosis not present

## 2020-03-09 DIAGNOSIS — N2581 Secondary hyperparathyroidism of renal origin: Secondary | ICD-10-CM | POA: Diagnosis not present

## 2020-03-09 DIAGNOSIS — N186 End stage renal disease: Secondary | ICD-10-CM | POA: Diagnosis not present

## 2020-03-12 DIAGNOSIS — Z992 Dependence on renal dialysis: Secondary | ICD-10-CM | POA: Diagnosis not present

## 2020-03-12 DIAGNOSIS — N186 End stage renal disease: Secondary | ICD-10-CM | POA: Diagnosis not present

## 2020-03-12 DIAGNOSIS — N2581 Secondary hyperparathyroidism of renal origin: Secondary | ICD-10-CM | POA: Diagnosis not present

## 2020-03-12 DIAGNOSIS — D259 Leiomyoma of uterus, unspecified: Secondary | ICD-10-CM | POA: Diagnosis not present

## 2020-03-14 DIAGNOSIS — N186 End stage renal disease: Secondary | ICD-10-CM | POA: Diagnosis not present

## 2020-03-14 DIAGNOSIS — N2581 Secondary hyperparathyroidism of renal origin: Secondary | ICD-10-CM | POA: Diagnosis not present

## 2020-03-14 DIAGNOSIS — Z992 Dependence on renal dialysis: Secondary | ICD-10-CM | POA: Diagnosis not present

## 2020-03-14 DIAGNOSIS — D259 Leiomyoma of uterus, unspecified: Secondary | ICD-10-CM | POA: Diagnosis not present

## 2020-03-16 DIAGNOSIS — N186 End stage renal disease: Secondary | ICD-10-CM | POA: Diagnosis not present

## 2020-03-16 DIAGNOSIS — Z992 Dependence on renal dialysis: Secondary | ICD-10-CM | POA: Diagnosis not present

## 2020-03-16 DIAGNOSIS — D259 Leiomyoma of uterus, unspecified: Secondary | ICD-10-CM | POA: Diagnosis not present

## 2020-03-16 DIAGNOSIS — N2581 Secondary hyperparathyroidism of renal origin: Secondary | ICD-10-CM | POA: Diagnosis not present

## 2020-03-19 DIAGNOSIS — Z992 Dependence on renal dialysis: Secondary | ICD-10-CM | POA: Diagnosis not present

## 2020-03-19 DIAGNOSIS — N186 End stage renal disease: Secondary | ICD-10-CM | POA: Diagnosis not present

## 2020-03-19 DIAGNOSIS — N2581 Secondary hyperparathyroidism of renal origin: Secondary | ICD-10-CM | POA: Diagnosis not present

## 2020-03-19 DIAGNOSIS — D259 Leiomyoma of uterus, unspecified: Secondary | ICD-10-CM | POA: Diagnosis not present

## 2020-03-21 DIAGNOSIS — Z992 Dependence on renal dialysis: Secondary | ICD-10-CM | POA: Diagnosis not present

## 2020-03-21 DIAGNOSIS — Q612 Polycystic kidney, adult type: Secondary | ICD-10-CM | POA: Diagnosis not present

## 2020-03-21 DIAGNOSIS — N2581 Secondary hyperparathyroidism of renal origin: Secondary | ICD-10-CM | POA: Diagnosis not present

## 2020-03-21 DIAGNOSIS — D509 Iron deficiency anemia, unspecified: Secondary | ICD-10-CM | POA: Diagnosis not present

## 2020-03-21 DIAGNOSIS — N186 End stage renal disease: Secondary | ICD-10-CM | POA: Diagnosis not present

## 2020-03-21 DIAGNOSIS — D259 Leiomyoma of uterus, unspecified: Secondary | ICD-10-CM | POA: Diagnosis not present

## 2020-03-23 DIAGNOSIS — N2581 Secondary hyperparathyroidism of renal origin: Secondary | ICD-10-CM | POA: Diagnosis not present

## 2020-03-23 DIAGNOSIS — D259 Leiomyoma of uterus, unspecified: Secondary | ICD-10-CM | POA: Diagnosis not present

## 2020-03-23 DIAGNOSIS — N186 End stage renal disease: Secondary | ICD-10-CM | POA: Diagnosis not present

## 2020-03-23 DIAGNOSIS — D509 Iron deficiency anemia, unspecified: Secondary | ICD-10-CM | POA: Diagnosis not present

## 2020-03-23 DIAGNOSIS — Z992 Dependence on renal dialysis: Secondary | ICD-10-CM | POA: Diagnosis not present

## 2020-03-26 DIAGNOSIS — N186 End stage renal disease: Secondary | ICD-10-CM | POA: Diagnosis not present

## 2020-03-26 DIAGNOSIS — Z992 Dependence on renal dialysis: Secondary | ICD-10-CM | POA: Diagnosis not present

## 2020-03-26 DIAGNOSIS — N2581 Secondary hyperparathyroidism of renal origin: Secondary | ICD-10-CM | POA: Diagnosis not present

## 2020-03-26 DIAGNOSIS — D259 Leiomyoma of uterus, unspecified: Secondary | ICD-10-CM | POA: Diagnosis not present

## 2020-03-26 DIAGNOSIS — D509 Iron deficiency anemia, unspecified: Secondary | ICD-10-CM | POA: Diagnosis not present

## 2020-03-30 DIAGNOSIS — D509 Iron deficiency anemia, unspecified: Secondary | ICD-10-CM | POA: Diagnosis not present

## 2020-03-30 DIAGNOSIS — N2581 Secondary hyperparathyroidism of renal origin: Secondary | ICD-10-CM | POA: Diagnosis not present

## 2020-03-30 DIAGNOSIS — Z992 Dependence on renal dialysis: Secondary | ICD-10-CM | POA: Diagnosis not present

## 2020-03-30 DIAGNOSIS — N186 End stage renal disease: Secondary | ICD-10-CM | POA: Diagnosis not present

## 2020-03-30 DIAGNOSIS — D259 Leiomyoma of uterus, unspecified: Secondary | ICD-10-CM | POA: Diagnosis not present

## 2020-04-02 DIAGNOSIS — D509 Iron deficiency anemia, unspecified: Secondary | ICD-10-CM | POA: Diagnosis not present

## 2020-04-02 DIAGNOSIS — N2581 Secondary hyperparathyroidism of renal origin: Secondary | ICD-10-CM | POA: Diagnosis not present

## 2020-04-02 DIAGNOSIS — N186 End stage renal disease: Secondary | ICD-10-CM | POA: Diagnosis not present

## 2020-04-02 DIAGNOSIS — D259 Leiomyoma of uterus, unspecified: Secondary | ICD-10-CM | POA: Diagnosis not present

## 2020-04-02 DIAGNOSIS — Z992 Dependence on renal dialysis: Secondary | ICD-10-CM | POA: Diagnosis not present

## 2020-04-04 DIAGNOSIS — D509 Iron deficiency anemia, unspecified: Secondary | ICD-10-CM | POA: Diagnosis not present

## 2020-04-04 DIAGNOSIS — D259 Leiomyoma of uterus, unspecified: Secondary | ICD-10-CM | POA: Diagnosis not present

## 2020-04-04 DIAGNOSIS — N2581 Secondary hyperparathyroidism of renal origin: Secondary | ICD-10-CM | POA: Diagnosis not present

## 2020-04-04 DIAGNOSIS — Z992 Dependence on renal dialysis: Secondary | ICD-10-CM | POA: Diagnosis not present

## 2020-04-04 DIAGNOSIS — N186 End stage renal disease: Secondary | ICD-10-CM | POA: Diagnosis not present

## 2020-04-06 DIAGNOSIS — N2581 Secondary hyperparathyroidism of renal origin: Secondary | ICD-10-CM | POA: Diagnosis not present

## 2020-04-06 DIAGNOSIS — D509 Iron deficiency anemia, unspecified: Secondary | ICD-10-CM | POA: Diagnosis not present

## 2020-04-06 DIAGNOSIS — D259 Leiomyoma of uterus, unspecified: Secondary | ICD-10-CM | POA: Diagnosis not present

## 2020-04-06 DIAGNOSIS — Z992 Dependence on renal dialysis: Secondary | ICD-10-CM | POA: Diagnosis not present

## 2020-04-06 DIAGNOSIS — N186 End stage renal disease: Secondary | ICD-10-CM | POA: Diagnosis not present

## 2020-04-09 DIAGNOSIS — D509 Iron deficiency anemia, unspecified: Secondary | ICD-10-CM | POA: Diagnosis not present

## 2020-04-09 DIAGNOSIS — D259 Leiomyoma of uterus, unspecified: Secondary | ICD-10-CM | POA: Diagnosis not present

## 2020-04-09 DIAGNOSIS — N186 End stage renal disease: Secondary | ICD-10-CM | POA: Diagnosis not present

## 2020-04-09 DIAGNOSIS — N2581 Secondary hyperparathyroidism of renal origin: Secondary | ICD-10-CM | POA: Diagnosis not present

## 2020-04-09 DIAGNOSIS — Z992 Dependence on renal dialysis: Secondary | ICD-10-CM | POA: Diagnosis not present

## 2020-04-11 DIAGNOSIS — N186 End stage renal disease: Secondary | ICD-10-CM | POA: Diagnosis not present

## 2020-04-11 DIAGNOSIS — D259 Leiomyoma of uterus, unspecified: Secondary | ICD-10-CM | POA: Diagnosis not present

## 2020-04-11 DIAGNOSIS — Z992 Dependence on renal dialysis: Secondary | ICD-10-CM | POA: Diagnosis not present

## 2020-04-11 DIAGNOSIS — D509 Iron deficiency anemia, unspecified: Secondary | ICD-10-CM | POA: Diagnosis not present

## 2020-04-11 DIAGNOSIS — N2581 Secondary hyperparathyroidism of renal origin: Secondary | ICD-10-CM | POA: Diagnosis not present

## 2020-04-13 DIAGNOSIS — Z992 Dependence on renal dialysis: Secondary | ICD-10-CM | POA: Diagnosis not present

## 2020-04-13 DIAGNOSIS — N2581 Secondary hyperparathyroidism of renal origin: Secondary | ICD-10-CM | POA: Diagnosis not present

## 2020-04-13 DIAGNOSIS — N186 End stage renal disease: Secondary | ICD-10-CM | POA: Diagnosis not present

## 2020-04-13 DIAGNOSIS — D259 Leiomyoma of uterus, unspecified: Secondary | ICD-10-CM | POA: Diagnosis not present

## 2020-04-13 DIAGNOSIS — D509 Iron deficiency anemia, unspecified: Secondary | ICD-10-CM | POA: Diagnosis not present

## 2020-04-16 DIAGNOSIS — N186 End stage renal disease: Secondary | ICD-10-CM | POA: Diagnosis not present

## 2020-04-16 DIAGNOSIS — D259 Leiomyoma of uterus, unspecified: Secondary | ICD-10-CM | POA: Diagnosis not present

## 2020-04-16 DIAGNOSIS — N2581 Secondary hyperparathyroidism of renal origin: Secondary | ICD-10-CM | POA: Diagnosis not present

## 2020-04-16 DIAGNOSIS — D509 Iron deficiency anemia, unspecified: Secondary | ICD-10-CM | POA: Diagnosis not present

## 2020-04-16 DIAGNOSIS — Z992 Dependence on renal dialysis: Secondary | ICD-10-CM | POA: Diagnosis not present

## 2020-04-18 DIAGNOSIS — Z992 Dependence on renal dialysis: Secondary | ICD-10-CM | POA: Diagnosis not present

## 2020-04-18 DIAGNOSIS — D509 Iron deficiency anemia, unspecified: Secondary | ICD-10-CM | POA: Diagnosis not present

## 2020-04-18 DIAGNOSIS — D259 Leiomyoma of uterus, unspecified: Secondary | ICD-10-CM | POA: Diagnosis not present

## 2020-04-18 DIAGNOSIS — N186 End stage renal disease: Secondary | ICD-10-CM | POA: Diagnosis not present

## 2020-04-18 DIAGNOSIS — N2581 Secondary hyperparathyroidism of renal origin: Secondary | ICD-10-CM | POA: Diagnosis not present

## 2020-04-20 DIAGNOSIS — D259 Leiomyoma of uterus, unspecified: Secondary | ICD-10-CM | POA: Diagnosis not present

## 2020-04-20 DIAGNOSIS — N186 End stage renal disease: Secondary | ICD-10-CM | POA: Diagnosis not present

## 2020-04-20 DIAGNOSIS — D509 Iron deficiency anemia, unspecified: Secondary | ICD-10-CM | POA: Diagnosis not present

## 2020-04-20 DIAGNOSIS — Z992 Dependence on renal dialysis: Secondary | ICD-10-CM | POA: Diagnosis not present

## 2020-04-20 DIAGNOSIS — N2581 Secondary hyperparathyroidism of renal origin: Secondary | ICD-10-CM | POA: Diagnosis not present

## 2020-04-21 DIAGNOSIS — Q612 Polycystic kidney, adult type: Secondary | ICD-10-CM | POA: Diagnosis not present

## 2020-04-21 DIAGNOSIS — Z992 Dependence on renal dialysis: Secondary | ICD-10-CM | POA: Diagnosis not present

## 2020-04-21 DIAGNOSIS — N186 End stage renal disease: Secondary | ICD-10-CM | POA: Diagnosis not present

## 2020-04-23 DIAGNOSIS — N2581 Secondary hyperparathyroidism of renal origin: Secondary | ICD-10-CM | POA: Diagnosis not present

## 2020-04-23 DIAGNOSIS — N186 End stage renal disease: Secondary | ICD-10-CM | POA: Diagnosis not present

## 2020-04-23 DIAGNOSIS — Z992 Dependence on renal dialysis: Secondary | ICD-10-CM | POA: Diagnosis not present

## 2020-04-23 DIAGNOSIS — D631 Anemia in chronic kidney disease: Secondary | ICD-10-CM | POA: Diagnosis not present

## 2020-04-23 DIAGNOSIS — D259 Leiomyoma of uterus, unspecified: Secondary | ICD-10-CM | POA: Diagnosis not present

## 2020-04-25 DIAGNOSIS — N186 End stage renal disease: Secondary | ICD-10-CM | POA: Diagnosis not present

## 2020-04-25 DIAGNOSIS — Z992 Dependence on renal dialysis: Secondary | ICD-10-CM | POA: Diagnosis not present

## 2020-04-25 DIAGNOSIS — D631 Anemia in chronic kidney disease: Secondary | ICD-10-CM | POA: Diagnosis not present

## 2020-04-25 DIAGNOSIS — N2581 Secondary hyperparathyroidism of renal origin: Secondary | ICD-10-CM | POA: Diagnosis not present

## 2020-04-25 DIAGNOSIS — D259 Leiomyoma of uterus, unspecified: Secondary | ICD-10-CM | POA: Diagnosis not present

## 2020-04-27 DIAGNOSIS — D259 Leiomyoma of uterus, unspecified: Secondary | ICD-10-CM | POA: Diagnosis not present

## 2020-04-27 DIAGNOSIS — N186 End stage renal disease: Secondary | ICD-10-CM | POA: Diagnosis not present

## 2020-04-27 DIAGNOSIS — D631 Anemia in chronic kidney disease: Secondary | ICD-10-CM | POA: Diagnosis not present

## 2020-04-27 DIAGNOSIS — Z992 Dependence on renal dialysis: Secondary | ICD-10-CM | POA: Diagnosis not present

## 2020-04-27 DIAGNOSIS — N2581 Secondary hyperparathyroidism of renal origin: Secondary | ICD-10-CM | POA: Diagnosis not present

## 2020-04-30 DIAGNOSIS — N186 End stage renal disease: Secondary | ICD-10-CM | POA: Diagnosis not present

## 2020-04-30 DIAGNOSIS — D259 Leiomyoma of uterus, unspecified: Secondary | ICD-10-CM | POA: Diagnosis not present

## 2020-04-30 DIAGNOSIS — N2581 Secondary hyperparathyroidism of renal origin: Secondary | ICD-10-CM | POA: Diagnosis not present

## 2020-04-30 DIAGNOSIS — D631 Anemia in chronic kidney disease: Secondary | ICD-10-CM | POA: Diagnosis not present

## 2020-04-30 DIAGNOSIS — Z992 Dependence on renal dialysis: Secondary | ICD-10-CM | POA: Diagnosis not present

## 2020-05-02 DIAGNOSIS — N186 End stage renal disease: Secondary | ICD-10-CM | POA: Diagnosis not present

## 2020-05-02 DIAGNOSIS — N2581 Secondary hyperparathyroidism of renal origin: Secondary | ICD-10-CM | POA: Diagnosis not present

## 2020-05-02 DIAGNOSIS — Z992 Dependence on renal dialysis: Secondary | ICD-10-CM | POA: Diagnosis not present

## 2020-05-02 DIAGNOSIS — D631 Anemia in chronic kidney disease: Secondary | ICD-10-CM | POA: Diagnosis not present

## 2020-05-02 DIAGNOSIS — D259 Leiomyoma of uterus, unspecified: Secondary | ICD-10-CM | POA: Diagnosis not present

## 2020-05-04 DIAGNOSIS — Z992 Dependence on renal dialysis: Secondary | ICD-10-CM | POA: Diagnosis not present

## 2020-05-04 DIAGNOSIS — N186 End stage renal disease: Secondary | ICD-10-CM | POA: Diagnosis not present

## 2020-05-04 DIAGNOSIS — N2581 Secondary hyperparathyroidism of renal origin: Secondary | ICD-10-CM | POA: Diagnosis not present

## 2020-05-04 DIAGNOSIS — D259 Leiomyoma of uterus, unspecified: Secondary | ICD-10-CM | POA: Diagnosis not present

## 2020-05-04 DIAGNOSIS — D631 Anemia in chronic kidney disease: Secondary | ICD-10-CM | POA: Diagnosis not present

## 2020-05-07 DIAGNOSIS — Z992 Dependence on renal dialysis: Secondary | ICD-10-CM | POA: Diagnosis not present

## 2020-05-07 DIAGNOSIS — D631 Anemia in chronic kidney disease: Secondary | ICD-10-CM | POA: Diagnosis not present

## 2020-05-07 DIAGNOSIS — N186 End stage renal disease: Secondary | ICD-10-CM | POA: Diagnosis not present

## 2020-05-07 DIAGNOSIS — N2581 Secondary hyperparathyroidism of renal origin: Secondary | ICD-10-CM | POA: Diagnosis not present

## 2020-05-07 DIAGNOSIS — D259 Leiomyoma of uterus, unspecified: Secondary | ICD-10-CM | POA: Diagnosis not present

## 2020-05-09 DIAGNOSIS — N2581 Secondary hyperparathyroidism of renal origin: Secondary | ICD-10-CM | POA: Diagnosis not present

## 2020-05-09 DIAGNOSIS — N186 End stage renal disease: Secondary | ICD-10-CM | POA: Diagnosis not present

## 2020-05-09 DIAGNOSIS — D259 Leiomyoma of uterus, unspecified: Secondary | ICD-10-CM | POA: Diagnosis not present

## 2020-05-09 DIAGNOSIS — Z992 Dependence on renal dialysis: Secondary | ICD-10-CM | POA: Diagnosis not present

## 2020-05-09 DIAGNOSIS — D631 Anemia in chronic kidney disease: Secondary | ICD-10-CM | POA: Diagnosis not present

## 2020-05-11 ENCOUNTER — Inpatient Hospital Stay (HOSPITAL_COMMUNITY): Payer: Medicare Other

## 2020-05-11 ENCOUNTER — Inpatient Hospital Stay (HOSPITAL_COMMUNITY): Payer: Medicare Other | Admitting: Certified Registered Nurse Anesthetist

## 2020-05-11 ENCOUNTER — Inpatient Hospital Stay (HOSPITAL_COMMUNITY)
Admission: EM | Admit: 2020-05-11 | Discharge: 2020-05-14 | DRG: 314 | Disposition: A | Discharge status: Home / Self Care | Payer: Medicare Other | Attending: Internal Medicine | Admitting: Internal Medicine

## 2020-05-11 ENCOUNTER — Other Ambulatory Visit: Payer: Self-pay

## 2020-05-11 ENCOUNTER — Encounter (HOSPITAL_COMMUNITY)
Admission: EM | Disposition: A | Discharge status: Home / Self Care | Payer: Self-pay | Source: Home / Self Care | Attending: Internal Medicine

## 2020-05-11 ENCOUNTER — Encounter (HOSPITAL_COMMUNITY): Payer: Self-pay | Admitting: Internal Medicine

## 2020-05-11 DIAGNOSIS — N186 End stage renal disease: Secondary | ICD-10-CM | POA: Diagnosis present

## 2020-05-11 DIAGNOSIS — R404 Transient alteration of awareness: Secondary | ICD-10-CM | POA: Diagnosis not present

## 2020-05-11 DIAGNOSIS — D62 Acute posthemorrhagic anemia: Secondary | ICD-10-CM | POA: Diagnosis present

## 2020-05-11 DIAGNOSIS — I517 Cardiomegaly: Secondary | ICD-10-CM | POA: Diagnosis not present

## 2020-05-11 DIAGNOSIS — I959 Hypotension, unspecified: Secondary | ICD-10-CM

## 2020-05-11 DIAGNOSIS — Z992 Dependence on renal dialysis: Secondary | ICD-10-CM | POA: Diagnosis not present

## 2020-05-11 DIAGNOSIS — T82838A Hemorrhage of vascular prosthetic devices, implants and grafts, initial encounter: Secondary | ICD-10-CM | POA: Diagnosis not present

## 2020-05-11 DIAGNOSIS — M1990 Unspecified osteoarthritis, unspecified site: Secondary | ICD-10-CM | POA: Diagnosis present

## 2020-05-11 DIAGNOSIS — D631 Anemia in chronic kidney disease: Secondary | ICD-10-CM | POA: Diagnosis present

## 2020-05-11 DIAGNOSIS — T82868A Thrombosis of vascular prosthetic devices, implants and grafts, initial encounter: Secondary | ICD-10-CM | POA: Diagnosis present

## 2020-05-11 DIAGNOSIS — Z79899 Other long term (current) drug therapy: Secondary | ICD-10-CM

## 2020-05-11 DIAGNOSIS — N250 Renal osteodystrophy: Secondary | ICD-10-CM | POA: Diagnosis not present

## 2020-05-11 DIAGNOSIS — T82590A Other mechanical complication of surgically created arteriovenous fistula, initial encounter: Secondary | ICD-10-CM | POA: Insufficient documentation

## 2020-05-11 DIAGNOSIS — M549 Dorsalgia, unspecified: Secondary | ICD-10-CM | POA: Diagnosis present

## 2020-05-11 DIAGNOSIS — Z833 Family history of diabetes mellitus: Secondary | ICD-10-CM

## 2020-05-11 DIAGNOSIS — I82C22 Chronic embolism and thrombosis of left internal jugular vein: Secondary | ICD-10-CM | POA: Diagnosis present

## 2020-05-11 DIAGNOSIS — Z800 Family history of malignant neoplasm of digestive organs: Secondary | ICD-10-CM | POA: Diagnosis not present

## 2020-05-11 DIAGNOSIS — R58 Hemorrhage, not elsewhere classified: Secondary | ICD-10-CM | POA: Diagnosis not present

## 2020-05-11 DIAGNOSIS — J811 Chronic pulmonary edema: Secondary | ICD-10-CM | POA: Diagnosis not present

## 2020-05-11 DIAGNOSIS — Z6831 Body mass index (BMI) 31.0-31.9, adult: Secondary | ICD-10-CM

## 2020-05-11 DIAGNOSIS — G8929 Other chronic pain: Secondary | ICD-10-CM | POA: Diagnosis present

## 2020-05-11 DIAGNOSIS — N2581 Secondary hyperparathyroidism of renal origin: Secondary | ICD-10-CM | POA: Diagnosis present

## 2020-05-11 DIAGNOSIS — Z87891 Personal history of nicotine dependence: Secondary | ICD-10-CM

## 2020-05-11 DIAGNOSIS — R9431 Abnormal electrocardiogram [ECG] [EKG]: Secondary | ICD-10-CM | POA: Diagnosis not present

## 2020-05-11 DIAGNOSIS — Z419 Encounter for procedure for purposes other than remedying health state, unspecified: Secondary | ICD-10-CM

## 2020-05-11 DIAGNOSIS — Y832 Surgical operation with anastomosis, bypass or graft as the cause of abnormal reaction of the patient, or of later complication, without mention of misadventure at the time of the procedure: Secondary | ICD-10-CM | POA: Diagnosis present

## 2020-05-11 DIAGNOSIS — Z803 Family history of malignant neoplasm of breast: Secondary | ICD-10-CM

## 2020-05-11 DIAGNOSIS — D649 Anemia, unspecified: Secondary | ICD-10-CM | POA: Diagnosis not present

## 2020-05-11 DIAGNOSIS — R000 Tachycardia, unspecified: Secondary | ICD-10-CM | POA: Diagnosis not present

## 2020-05-11 DIAGNOSIS — Z20822 Contact with and (suspected) exposure to covid-19: Secondary | ICD-10-CM | POA: Diagnosis present

## 2020-05-11 DIAGNOSIS — I739 Peripheral vascular disease, unspecified: Secondary | ICD-10-CM | POA: Diagnosis not present

## 2020-05-11 DIAGNOSIS — I120 Hypertensive chronic kidney disease with stage 5 chronic kidney disease or end stage renal disease: Secondary | ICD-10-CM | POA: Diagnosis not present

## 2020-05-11 HISTORY — PX: AV FISTULA PLACEMENT: SHX1204

## 2020-05-11 HISTORY — PX: INSERTION OF DIALYSIS CATHETER: SHX1324

## 2020-05-11 LAB — CBC WITH DIFFERENTIAL/PLATELET
Abs Immature Granulocytes: 0.03 10*3/uL (ref 0.00–0.07)
Basophils Absolute: 0 10*3/uL (ref 0.0–0.1)
Basophils Relative: 1 %
Eosinophils Absolute: 0.2 10*3/uL (ref 0.0–0.5)
Eosinophils Relative: 2 %
HCT: 31.3 % — ABNORMAL LOW (ref 36.0–46.0)
Hemoglobin: 9 g/dL — ABNORMAL LOW (ref 12.0–15.0)
Immature Granulocytes: 0 %
Lymphocytes Relative: 59 %
Lymphs Abs: 4.8 10*3/uL — ABNORMAL HIGH (ref 0.7–4.0)
MCH: 29.1 pg (ref 26.0–34.0)
MCHC: 28.8 g/dL — ABNORMAL LOW (ref 30.0–36.0)
MCV: 101.3 fL — ABNORMAL HIGH (ref 80.0–100.0)
Monocytes Absolute: 0.6 10*3/uL (ref 0.1–1.0)
Monocytes Relative: 7 %
Neutro Abs: 2.6 10*3/uL (ref 1.7–7.7)
Neutrophils Relative %: 31 %
Platelets: 118 10*3/uL — ABNORMAL LOW (ref 150–400)
RBC: 3.09 MIL/uL — ABNORMAL LOW (ref 3.87–5.11)
RDW: 15.9 % — ABNORMAL HIGH (ref 11.5–15.5)
WBC: 8.1 10*3/uL (ref 4.0–10.5)
nRBC: 0 % (ref 0.0–0.2)

## 2020-05-11 LAB — CBC
HCT: 34.1 % — ABNORMAL LOW (ref 36.0–46.0)
Hemoglobin: 10.6 g/dL — ABNORMAL LOW (ref 12.0–15.0)
MCH: 29.9 pg (ref 26.0–34.0)
MCHC: 31.1 g/dL (ref 30.0–36.0)
MCV: 96.1 fL (ref 80.0–100.0)
Platelets: 82 10*3/uL — ABNORMAL LOW (ref 150–400)
RBC: 3.55 MIL/uL — ABNORMAL LOW (ref 3.87–5.11)
RDW: 15.9 % — ABNORMAL HIGH (ref 11.5–15.5)
WBC: 8.7 10*3/uL (ref 4.0–10.5)
nRBC: 0 % (ref 0.0–0.2)

## 2020-05-11 LAB — COMPREHENSIVE METABOLIC PANEL
ALT: 10 U/L (ref 0–44)
AST: 15 U/L (ref 15–41)
Albumin: 2.7 g/dL — ABNORMAL LOW (ref 3.5–5.0)
Alkaline Phosphatase: 60 U/L (ref 38–126)
Anion gap: 15 (ref 5–15)
BUN: 28 mg/dL — ABNORMAL HIGH (ref 8–23)
CO2: 27 mmol/L (ref 22–32)
Calcium: 7.7 mg/dL — ABNORMAL LOW (ref 8.9–10.3)
Chloride: 99 mmol/L (ref 98–111)
Creatinine, Ser: 8.36 mg/dL — ABNORMAL HIGH (ref 0.44–1.00)
GFR calc Af Amer: 5 mL/min — ABNORMAL LOW (ref >60)
GFR calc non Af Amer: 4 mL/min — ABNORMAL LOW (ref >60)
Glucose, Bld: 144 mg/dL — ABNORMAL HIGH (ref 70–99)
Potassium: 4.3 mmol/L (ref 3.5–5.1)
Sodium: 141 mmol/L (ref 135–145)
Total Bilirubin: 0.6 mg/dL (ref 0.3–1.2)
Total Protein: 5.2 g/dL — ABNORMAL LOW (ref 6.5–8.1)

## 2020-05-11 LAB — PROTIME-INR
INR: 1.1 (ref 0.8–1.2)
Prothrombin Time: 14.1 seconds (ref 11.4–15.2)

## 2020-05-11 LAB — I-STAT CHEM 8, ED
BUN: 35 mg/dL — ABNORMAL HIGH (ref 8–23)
Calcium, Ion: 0.87 mmol/L — CL (ref 1.15–1.40)
Chloride: 100 mmol/L (ref 98–111)
Creatinine, Ser: 7.8 mg/dL — ABNORMAL HIGH (ref 0.44–1.00)
Glucose, Bld: 128 mg/dL — ABNORMAL HIGH (ref 70–99)
HCT: 30 % — ABNORMAL LOW (ref 36.0–46.0)
Hemoglobin: 10.2 g/dL — ABNORMAL LOW (ref 12.0–15.0)
Potassium: 4.2 mmol/L (ref 3.5–5.1)
Sodium: 140 mmol/L (ref 135–145)
TCO2: 28 mmol/L (ref 22–32)

## 2020-05-11 LAB — BASIC METABOLIC PANEL
Anion gap: 11 (ref 5–15)
BUN: 27 mg/dL — ABNORMAL HIGH (ref 8–23)
CO2: 29 mmol/L (ref 22–32)
Calcium: 7.4 mg/dL — ABNORMAL LOW (ref 8.9–10.3)
Chloride: 100 mmol/L (ref 98–111)
Creatinine, Ser: 7.94 mg/dL — ABNORMAL HIGH (ref 0.44–1.00)
GFR calc Af Amer: 5 mL/min — ABNORMAL LOW (ref >60)
GFR calc non Af Amer: 5 mL/min — ABNORMAL LOW (ref >60)
Glucose, Bld: 103 mg/dL — ABNORMAL HIGH (ref 70–99)
Potassium: 5.9 mmol/L — ABNORMAL HIGH (ref 3.5–5.1)
Sodium: 140 mmol/L (ref 135–145)

## 2020-05-11 LAB — ABO/RH: ABO/RH(D): A POS

## 2020-05-11 LAB — RENAL FUNCTION PANEL
Albumin: 2.9 g/dL — ABNORMAL LOW (ref 3.5–5.0)
Anion gap: 14 (ref 5–15)
BUN: 32 mg/dL — ABNORMAL HIGH (ref 8–23)
CO2: 24 mmol/L (ref 22–32)
Calcium: 7.6 mg/dL — ABNORMAL LOW (ref 8.9–10.3)
Chloride: 100 mmol/L (ref 98–111)
Creatinine, Ser: 8.55 mg/dL — ABNORMAL HIGH (ref 0.44–1.00)
GFR calc Af Amer: 5 mL/min — ABNORMAL LOW (ref >60)
GFR calc non Af Amer: 4 mL/min — ABNORMAL LOW (ref >60)
Glucose, Bld: 180 mg/dL — ABNORMAL HIGH (ref 70–99)
Phosphorus: 4.8 mg/dL — ABNORMAL HIGH (ref 2.5–4.6)
Potassium: 5.8 mmol/L — ABNORMAL HIGH (ref 3.5–5.1)
Sodium: 138 mmol/L (ref 135–145)

## 2020-05-11 LAB — SARS CORONAVIRUS 2 BY RT PCR (HOSPITAL ORDER, PERFORMED IN ~~LOC~~ HOSPITAL LAB): SARS Coronavirus 2: NEGATIVE

## 2020-05-11 LAB — PREPARE RBC (CROSSMATCH)

## 2020-05-11 SURGERY — INSERTION OF DIALYSIS CATHETER
Anesthesia: General | Site: Thigh | Laterality: Left

## 2020-05-11 MED ORDER — ALBUMIN HUMAN 5 % IV SOLN: INTRAVENOUS | Status: DC | PRN

## 2020-05-11 MED ORDER — CHLORHEXIDINE GLUCONATE 0.12 % MT SOLN
OROMUCOSAL | Status: AC
  Administered 2020-05-11: 15 mL via OROMUCOSAL
  Filled 2020-05-11: qty 15

## 2020-05-11 MED ORDER — LACTATED RINGERS IV BOLUS
500.0000 mL | Freq: Once | INTRAVENOUS | Status: AC
  Administered 2020-05-11: 500 mL via INTRAVENOUS

## 2020-05-11 MED ORDER — FENTANYL CITRATE (PF) 100 MCG/2ML IJ SOLN: 25.0000 ug | INTRAMUSCULAR | Status: DC | PRN

## 2020-05-11 MED ORDER — PROPOFOL 500 MG/50ML IV EMUL: INTRAVENOUS | Status: DC | PRN

## 2020-05-11 MED ORDER — IODIXANOL 320 MG/ML IV SOLN
INTRAVENOUS | Status: DC | PRN
  Administered 2020-05-11: 10 mL via INTRA_ARTERIAL

## 2020-05-11 MED ORDER — ONDANSETRON HCL 4 MG PO TABS: 4.0000 mg | ORAL_TABLET | Freq: Four times a day (QID) | ORAL | Status: DC | PRN

## 2020-05-11 MED ORDER — FENTANYL CITRATE (PF) 250 MCG/5ML IJ SOLN
INTRAMUSCULAR | Status: AC
  Filled 2020-05-11: qty 5

## 2020-05-11 MED ORDER — HEPARIN SODIUM (PORCINE) 1000 UNIT/ML IJ SOLN
INTRAMUSCULAR | Status: AC
  Filled 2020-05-11: qty 1

## 2020-05-11 MED ORDER — ONDANSETRON HCL 4 MG/2ML IJ SOLN
INTRAMUSCULAR | Status: DC | PRN
  Administered 2020-05-11: 4 mg via INTRAVENOUS

## 2020-05-11 MED ORDER — PROSOURCE PLUS PO LIQD
30.0000 mL | Freq: Two times a day (BID) | ORAL | Status: DC
  Administered 2020-05-12 – 2020-05-13 (×3): 30 mL via ORAL
  Filled 2020-05-11 (×4): qty 30

## 2020-05-11 MED ORDER — LIDOCAINE 2% (20 MG/ML) 5 ML SYRINGE
INTRAMUSCULAR | Status: DC | PRN
  Administered 2020-05-11: 60 mg via INTRAVENOUS

## 2020-05-11 MED ORDER — ACETAMINOPHEN 500 MG PO TABS
ORAL_TABLET | ORAL | Status: AC
  Administered 2020-05-11: 1000 mg via ORAL
  Filled 2020-05-11: qty 2

## 2020-05-11 MED ORDER — SODIUM CHLORIDE 0.9 % IV SOLN: 100.0000 mL | INTRAVENOUS | Status: DC | PRN

## 2020-05-11 MED ORDER — SODIUM CHLORIDE 0.9 % IV SOLN: INTRAVENOUS | Status: DC

## 2020-05-11 MED ORDER — 0.9 % SODIUM CHLORIDE (POUR BTL) OPTIME
TOPICAL | Status: DC | PRN
  Administered 2020-05-11: 1000 mL

## 2020-05-11 MED ORDER — OXYCODONE-ACETAMINOPHEN 5-325 MG PO TABS
1.0000 | ORAL_TABLET | ORAL | Status: DC | PRN
  Administered 2020-05-14: 1 via ORAL
  Filled 2020-05-11: qty 1

## 2020-05-11 MED ORDER — LIDOCAINE HCL (PF) 1 % IJ SOLN
INTRAMUSCULAR | Status: AC
  Filled 2020-05-11: qty 30

## 2020-05-11 MED ORDER — ALTEPLASE 2 MG IJ SOLR: 2.0000 mg | Freq: Once | INTRAMUSCULAR | Status: DC | PRN

## 2020-05-11 MED ORDER — PHENYLEPHRINE HCL-NACL 10-0.9 MG/250ML-% IV SOLN
INTRAVENOUS | Status: DC | PRN
  Administered 2020-05-11: 50 ug/min via INTRAVENOUS

## 2020-05-11 MED ORDER — CEFAZOLIN SODIUM-DEXTROSE 2-4 GM/100ML-% IV SOLN
2.0000 g | INTRAVENOUS | Status: AC
  Administered 2020-05-11: 2 g via INTRAVENOUS
  Filled 2020-05-11: qty 100

## 2020-05-11 MED ORDER — SODIUM CHLORIDE 0.9 % IV SOLN
INTRAVENOUS | Status: AC
  Filled 2020-05-11: qty 1.2

## 2020-05-11 MED ORDER — CHLORHEXIDINE GLUCONATE 0.12 % MT SOLN: 15.0000 mL | Freq: Once | OROMUCOSAL | Status: AC

## 2020-05-11 MED ORDER — LIDOCAINE-PRILOCAINE 2.5-2.5 % EX CREA
1.0000 "application " | TOPICAL_CREAM | CUTANEOUS | Status: DC | PRN
  Filled 2020-05-11: qty 5

## 2020-05-11 MED ORDER — SODIUM CHLORIDE 0.9 % IV SOLN: INTRAVENOUS | Status: DC | PRN

## 2020-05-11 MED ORDER — DEXAMETHASONE SODIUM PHOSPHATE 10 MG/ML IJ SOLN
INTRAMUSCULAR | Status: DC | PRN
  Administered 2020-05-11: 4 mg via INTRAVENOUS

## 2020-05-11 MED ORDER — CHLORHEXIDINE GLUCONATE CLOTH 2 % EX PADS
6.0000 | MEDICATED_PAD | Freq: Every day | CUTANEOUS | Status: DC
  Administered 2020-05-13 – 2020-05-14 (×2): 6 via TOPICAL

## 2020-05-11 MED ORDER — LIDOCAINE-EPINEPHRINE (PF) 1 %-1:200000 IJ SOLN
INTRAMUSCULAR | Status: AC
  Filled 2020-05-11: qty 30

## 2020-05-11 MED ORDER — LIDOCAINE HCL (PF) 1 % IJ SOLN: 5.0000 mL | INTRAMUSCULAR | Status: DC | PRN

## 2020-05-11 MED ORDER — SUGAMMADEX SODIUM 200 MG/2ML IV SOLN
INTRAVENOUS | Status: DC | PRN
  Administered 2020-05-11: 200 mg via INTRAVENOUS

## 2020-05-11 MED ORDER — FENTANYL CITRATE (PF) 100 MCG/2ML IJ SOLN
INTRAMUSCULAR | Status: DC | PRN
Start: 2020-05-11 — End: 2020-05-11
  Administered 2020-05-11: 50 ug via INTRAVENOUS
  Administered 2020-05-11: 100 ug via INTRAVENOUS

## 2020-05-11 MED ORDER — FENTANYL CITRATE (PF) 100 MCG/2ML IJ SOLN
50.0000 ug | Freq: Once | INTRAMUSCULAR | Status: AC
  Administered 2020-05-11: 50 ug via INTRAVENOUS
  Filled 2020-05-11: qty 2

## 2020-05-11 MED ORDER — PENTAFLUOROPROP-TETRAFLUOROETH EX AERO: 1.0000 "application " | INHALATION_SPRAY | CUTANEOUS | Status: DC | PRN

## 2020-05-11 MED ORDER — PROPOFOL 10 MG/ML IV BOLUS
INTRAVENOUS | Status: DC | PRN
  Administered 2020-05-11: 50 mg via INTRAVENOUS

## 2020-05-11 MED ORDER — HEPARIN SODIUM (PORCINE) 1000 UNIT/ML IJ SOLN
INTRAMUSCULAR | Status: DC | PRN
  Administered 2020-05-11: 6200 [IU]

## 2020-05-11 MED ORDER — SODIUM CHLORIDE 0.9% IV SOLUTION: Freq: Once | INTRAVENOUS | Status: AC

## 2020-05-11 MED ORDER — PHENYLEPHRINE HCL (PRESSORS) 10 MG/ML IV SOLN
INTRAVENOUS | Status: DC | PRN
  Administered 2020-05-11: 200 ug via INTRAVENOUS

## 2020-05-11 MED ORDER — SODIUM CHLORIDE 0.9 % IV SOLN
INTRAVENOUS | Status: DC | PRN
  Administered 2020-05-11: 500 mL

## 2020-05-11 MED ORDER — EPHEDRINE SULFATE 50 MG/ML IJ SOLN
INTRAMUSCULAR | Status: DC | PRN
  Administered 2020-05-11: 20 mg via INTRAVENOUS
  Administered 2020-05-11: 15 mg via INTRAVENOUS

## 2020-05-11 MED ORDER — STERILE WATER FOR IRRIGATION IR SOLN
Status: DC | PRN
  Administered 2020-05-11: 1000 mL

## 2020-05-11 MED ORDER — ROCURONIUM BROMIDE 10 MG/ML (PF) SYRINGE
PREFILLED_SYRINGE | INTRAVENOUS | Status: DC | PRN
  Administered 2020-05-11: 30 mg via INTRAVENOUS
  Administered 2020-05-11: 40 mg via INTRAVENOUS

## 2020-05-11 MED ORDER — ACETAMINOPHEN 500 MG PO TABS: 1000.0000 mg | ORAL_TABLET | Freq: Once | ORAL | Status: AC

## 2020-05-11 MED ORDER — ONDANSETRON HCL 4 MG/2ML IJ SOLN: 4.0000 mg | Freq: Four times a day (QID) | INTRAMUSCULAR | Status: DC | PRN

## 2020-05-11 SURGICAL SUPPLY — 68 items
ADH SKN CLS APL DERMABOND .7 (GAUZE/BANDAGES/DRESSINGS) ×2
APL PRP STRL LF DISP 70% ISPRP (MISCELLANEOUS)
ARMBAND PINK RESTRICT EXTREMIT (MISCELLANEOUS) ×4 IMPLANT
BAG DECANTER FOR FLEXI CONT (MISCELLANEOUS) ×2 IMPLANT
BIOPATCH RED 1 DISK 7.0 (GAUZE/BANDAGES/DRESSINGS) ×3 IMPLANT
BIOPATCH RED 1IN DISK 7.0MM (GAUZE/BANDAGES/DRESSINGS) ×1
CANISTER SUCT 3000ML PPV (MISCELLANEOUS) ×6 IMPLANT
CANNULA VESSEL 3MM 2 BLNT TIP (CANNULA) ×6 IMPLANT
CATH PALINDROME-P 19CM W/VT (CATHETERS) IMPLANT
CATH PALINDROME-P 23CM W/VT (CATHETERS) IMPLANT
CATH PALINDROME-P 28CM W/VT (CATHETERS) IMPLANT
CATH STRAIGHT 5FR 65CM (CATHETERS) ×2 IMPLANT
CHLORAPREP W/TINT 26 (MISCELLANEOUS) ×2 IMPLANT
CLIP VESOCCLUDE MED 6/CT (CLIP) ×6 IMPLANT
CLIP VESOCCLUDE SM WIDE 6/CT (CLIP) ×6 IMPLANT
COVER PROBE W GEL 5X96 (DRAPES) ×2 IMPLANT
COVER SURGICAL LIGHT HANDLE (MISCELLANEOUS) ×4 IMPLANT
COVER WAND RF STERILE (DRAPES) ×6 IMPLANT
DECANTER SPIKE VIAL GLASS SM (MISCELLANEOUS) ×2 IMPLANT
DERMABOND ADVANCED (GAUZE/BANDAGES/DRESSINGS) ×2
DERMABOND ADVANCED .7 DNX12 (GAUZE/BANDAGES/DRESSINGS) ×4 IMPLANT
DEVICE TORQUE KENDALL .025-038 (MISCELLANEOUS) ×2 IMPLANT
DRAPE C-ARM 42X72 X-RAY (DRAPES) ×4 IMPLANT
DRAPE CHEST BREAST 15X10 FENES (DRAPES) ×4 IMPLANT
DRAPE INCISE IOBAN 66X45 STRL (DRAPES) ×8 IMPLANT
ELECT REM PT RETURN 9FT ADLT (ELECTROSURGICAL) ×8
ELECTRODE REM PT RTRN 9FT ADLT (ELECTROSURGICAL) ×4 IMPLANT
GAUZE 4X4 16PLY RFD (DISPOSABLE) ×2 IMPLANT
GAUZE SPONGE 4X4 12PLY STRL LF (GAUZE/BANDAGES/DRESSINGS) ×4 IMPLANT
GLOVE BIO SURGEON STRL SZ7.5 (GLOVE) ×8 IMPLANT
GLOVE BIOGEL PI IND STRL 6.5 (GLOVE) IMPLANT
GLOVE BIOGEL PI IND STRL 7.5 (GLOVE) IMPLANT
GLOVE BIOGEL PI IND STRL 8 (GLOVE) ×6 IMPLANT
GLOVE BIOGEL PI INDICATOR 6.5 (GLOVE) ×4
GLOVE BIOGEL PI INDICATOR 7.5 (GLOVE) ×2
GLOVE BIOGEL PI INDICATOR 8 (GLOVE) ×2
GLOVE SURG SS PI 7.5 STRL IVOR (GLOVE) ×2 IMPLANT
GOWN STRL REUS W/ TWL LRG LVL3 (GOWN DISPOSABLE) ×16 IMPLANT
GOWN STRL REUS W/TWL LRG LVL3 (GOWN DISPOSABLE) ×8
GUIDEWIRE ANGLED .035X150CM (WIRE) ×2 IMPLANT
KIT BASIN OR (CUSTOM PROCEDURE TRAY) ×8 IMPLANT
KIT PALINDROME-P 55CM (CATHETERS) ×2 IMPLANT
KIT TURNOVER KIT B (KITS) ×8 IMPLANT
NDL 18GX1X1/2 (RX/OR ONLY) (NEEDLE) ×2 IMPLANT
NDL HYPO 25GX1X1/2 BEV (NEEDLE) ×2 IMPLANT
NEEDLE 18GX1X1/2 (RX/OR ONLY) (NEEDLE) ×4 IMPLANT
NEEDLE HYPO 25GX1X1/2 BEV (NEEDLE) ×4 IMPLANT
NS IRRIG 1000ML POUR BTL (IV SOLUTION) ×8 IMPLANT
PACK CV ACCESS (CUSTOM PROCEDURE TRAY) ×8 IMPLANT
PACK SURGICAL SETUP 50X90 (CUSTOM PROCEDURE TRAY) ×2 IMPLANT
PAD ARMBOARD 7.5X6 YLW CONV (MISCELLANEOUS) ×16 IMPLANT
SET MICROPUNCTURE 5F STIFF (MISCELLANEOUS) ×4 IMPLANT
SPONGE SURGIFOAM ABS GEL 100 (HEMOSTASIS) IMPLANT
SUT ETHILON 3 0 PS 1 (SUTURE) ×8 IMPLANT
SUT PROLENE 6 0 BV (SUTURE) ×8 IMPLANT
SUT VIC AB 2-0 CTB1 (SUTURE) ×2 IMPLANT
SUT VIC AB 3-0 SH 27 (SUTURE) ×4
SUT VIC AB 3-0 SH 27X BRD (SUTURE) ×6 IMPLANT
SUT VICRYL 4-0 PS2 18IN ABS (SUTURE) ×10 IMPLANT
SYR 10ML LL (SYRINGE) ×4 IMPLANT
SYR 20ML LL LF (SYRINGE) ×8 IMPLANT
SYR 5ML LL (SYRINGE) ×6 IMPLANT
SYR CONTROL 10ML LL (SYRINGE) ×4 IMPLANT
TAPE CLOTH SURG 4X10 WHT LF (GAUZE/BANDAGES/DRESSINGS) ×4 IMPLANT
TOWEL GREEN STERILE (TOWEL DISPOSABLE) ×10 IMPLANT
TOWEL GREEN STERILE FF (TOWEL DISPOSABLE) ×4 IMPLANT
UNDERPAD 30X36 HEAVY ABSORB (UNDERPADS AND DIAPERS) ×6 IMPLANT
WATER STERILE IRR 1000ML POUR (IV SOLUTION) ×8 IMPLANT

## 2020-05-11 NOTE — Consult Note (Signed)
REASON FOR CONSULT:    Bleeding from left thigh AV graft.  The consult is requested by Dr. Dayna Barker.  ASSESSMENT & PLAN:   END-STAGE RENAL DISEASE: This patient has a degenerative left thigh AV graft.  She has previously been told by nephrology that she will need placement of a tunneled dialysis catheter as the graft cannot be salvaged if it clots again.  She developed bleeding from her graft tonight and a pressure dressing was applied and her graft is now occluded.  Given that the graft is markedly degenerative and has been worked on multiple times I would recommend exploring the area that bled to be sure that we can cover any exposed graft here and placing a tunneled dialysis catheter.  She can then be reevaluated for new access, potentially a right thigh AV graft.  I have ordered a Covid test.  We will plan on surgery later today.   [name], MD Office: [phone]   HPI:   Sue Neal is a pleasant 74 y.o. female, he dialyzes on Tuesdays Thursdays and Saturdays.  She has a left thigh AV graft.  She was told by nephrology that the graft is clotted multiple times and if it clotted again she will need placement of a tunneled dialysis catheter as the graft cannot be salvaged.  She was picking at something overlying her graft on the medial aspect tonight and developed significant bleeding.  She was brought by EMS.  Reportedly there was significant blood loss at the scene and initial blood pressure was 60/40.  Pressure dressing has been applied to the left thigh AV graft and the bleeding had stopped.  Past Medical History:  Diagnosis Date  . Anemia   . Arthritis   . Back pain, chronic   . Blood transfusion without reported diagnosis   . Cataract   . Complication of anesthesia    " I woke up during the procedure."  . ESRD (end stage renal disease) on dialysis ([place])    "TTS; [place]" (02/07/2016)  . Hyperparathyroidism, secondary ([place])   . Hypertension    off  meds now  . 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 Uterine fibroid     Family History  Problem Relation Age of Onset  . Colon cancer Mother 58  . Diabetes Cousin   . Breast cancer Maternal Aunt   . Breast cancer Cousin     SOCIAL HISTORY: Social History   Socioeconomic History  . Marital status: Divorced    Spouse name: Not on file  . Number of children: 0  . Years of education: 22  . Highest education level: Not on file  Occupational History  . Occupation: Retired   Tobacco Use  . Smoking status: Former Smoker    Packs/day: 0.10    Types: Cigarettes    Quit date: 02/03/1978    Years since quitting: 42.2  . Smokeless tobacco: Never Used  Substance and Sexual Activity  . Alcohol use: Yes    Alcohol/week: 0.0 standard drinks    Comment: rare-once a year  . Drug use: No  . Sexual activity: Not on file  Other Topics Concern  . Not on file  Social History Narrative   Fun: Bowel, read, try new restaurants.    Denies abuse and feels safe at home.    Social Determinants of Health   Financial Resource Strain:   . Difficulty  of Paying Living Expenses: Not on file  Food Insecurity:   . Worried About Charity fundraiser in the Last Year: Not on file  . Ran Out of Food in the Last Year: Not on file  Transportation Needs:   . Lack of Transportation (Medical): Not on file  . Lack of Transportation (Non-Medical): Not on file  Physical Activity:   . Days of Exercise per Week: Not on file  . Minutes of Exercise per Session: Not on file  Stress:   . Feeling of Stress : Not on file  Social Connections:   . Frequency of Communication with Friends and Family: Not on file  . Frequency of Social Gatherings with Friends and Family: Not on file  . Attends Religious Services: Not on file  . Active Member of Clubs or Organizations: Not on file  . Attends Archivist Meetings: Not on file  .  Marital Status: Not on file  Intimate Partner Violence:   . Fear of Current or Ex-Partner: Not on file  . Emotionally Abused: Not on file  . Physically Abused: Not on file  . Sexually Abused: Not on file    No Known Allergies  Current Facility-Administered Medications  Medication Dose Route Frequency Provider Last Rate Last Admin  . 0.9 %  sodium chloride infusion (Manually program via Guardrails IV Fluids)   Intravenous Once Mesner, Corene Cornea, MD      . Derrill Memo ON 05/12/2020] ceFAZolin (ANCEF) IVPB 2g/100 mL premix  2 g Intravenous On Call Angelia Mould, MD      . lactated ringers bolus 500 mL  500 mL Intravenous Once Mesner, Corene Cornea, MD       Current Outpatient Medications  Medication Sig Dispense Refill  . acetaminophen (TYLENOL) 500 MG tablet Take 500-1,000 mg by mouth 2 (two) times daily as needed for moderate pain (for back pain).     . cinacalcet (SENSIPAR) 90 MG tablet Take 180 mg by mouth daily after supper.     . diphenhydramine-acetaminophen (TYLENOL PM) 25-500 MG TABS tablet Take 1 tablet by mouth at bedtime as needed (Back pain).    Marland Kitchen ketotifen (ZADITOR) 0.025 % ophthalmic solution Place 1 drop into both eyes daily as needed (for dry eyes).    . NONFORMULARY OR COMPOUNDED ITEM Pharmazen compound: Nail Fungus - Complex Nail and Foot Disorders - Fluconazole 1%, DMSO 25%, Fluocinonide 0.05%, Ketoconazole 2%, apply 1-2 pumps to each affected nail beds and feet twice daily. (Patient not taking: Reported on 09/20/2019) 120 each 11  . Nutritional Supplements (FEEDING SUPPLEMENT, NEPRO CARB STEADY,) LIQD Take 237 mLs by mouth every Tuesday, Thursday, and Saturday at 6 PM.     . sevelamer (RENVELA) 800 MG tablet Take 800-4,000 mg by mouth See admin instructions. 1600 mg with snacks and 4000 mg with meals      REVIEW OF SYSTEMS:  [X]  denotes positive finding, [ ]  denotes negative finding Cardiac  Comments:  Chest pain or chest pressure:    Shortness of breath upon exertion:      Short of breath when lying flat:    Irregular heart rhythm:        Vascular    Pain in calf, thigh, or hip brought on by ambulation:    Pain in feet at night that wakes you up from your sleep:     Blood clot in your veins:    Leg swelling:         Pulmonary    Oxygen at home:  Productive cough:     Wheezing:         Neurologic    Sudden weakness in arms or legs:     Sudden numbness in arms or legs:     Sudden onset of difficulty speaking or slurred speech:    Temporary loss of vision in one eye:     Problems with dizziness:         Gastrointestinal    Blood in stool:     Vomited blood:         Genitourinary    Burning when urinating:     Blood in urine:        Psychiatric    Major depression:         Hematologic    Bleeding problems:    Problems with blood clotting too easily:        Skin    Rashes or ulcers:        Constitutional    Fever or chills:     PHYSICAL EXAM:   Vitals:   05/11/20 0108 05/11/20 0111  BP: (!) 72/56   Pulse: 72   Resp: (!) 8   TempSrc: Oral   SpO2: 100%   Weight:  83.9 kg  Height:  5\' 5"  (1.651 m)    GENERAL: The patient is a well-nourished female, in no acute distress. The vital signs are documented above. CARDIAC: There is a regular rate and rhythm.  VASCULAR: There is no thrill or bruit in her left thigh AV graft. There is currently no active bleeding. PULMONARY: There is good air exchange bilaterally without wheezing or rales. ABDOMEN: Soft and non-tender with normal pitched bowel sounds.  MUSCULOSKELETAL: There are no major deformities or cyanosis. NEUROLOGIC: No focal weakness or paresthesias are detected. SKIN: There is a 5 mm wound overlying the medial aspect of the graft where she bled.  There is no active bleeding. PSYCHIATRIC: The patient has a normal affect.  DATA:    COVID-19 test pending  LABS: Her labs are pending.

## 2020-05-11 NOTE — Op Note (Signed)
NAME: Sue Neal    MRN: [id number] DOB: 27-Jul-1946    DATE OF OPERATION: 05/11/2020  PREOP DIAGNOSIS:    Thrombosed left thigh AV graft secondary to bleeding and compression  POSTOP DIAGNOSIS:    Same  PROCEDURE:    1.  Ultrasound-guided access to the right internal jugular vein with venogram right IJ and azygous vein 2.  Placement of left femoral 55 cm tunneled dialysis catheter under ultrasound guidance 3.  Repair of ulceration left thigh  SURGEON: Judeth Cornfield. Scot Dock, MD  ASSIST: None  ANESTHESIA: General  EBL: Minimal  INDICATIONS:    Sue Neal is a 74 y.o. female who had rubbed her left thigh graft and disrupted an eschar which resulted in significant bleeding.  She was brought to the emergency department by paramedics with a tight dressing on her thigh.  When I evaluated the patient early this morning the graft had clotted.  Based on her history the plan was for no further revisions of this graft given that it was markedly degenerative.  I recommended addressing the eschar in the thigh and placing a tunneled dialysis catheter  FINDINGS:   The left IJ was occluded by ultrasound.  The right IJ was patent but the wire would only pass into the azygos vein.  I shot a venogram confirming this.  Therefore I placed a left femoral 55 cm tunneled dialysis catheter with plans ultimately for placement of a new right thigh graft when she recovers from this acute event  TECHNIQUE:   Patient was taken to the operating room and received a general anesthetic.  Looked with the ultrasound in the left IJ could not be identified and was likely chronically occluded.  The right IJ appeared to be patent although somewhat small.  The neck and upper chest were prepped and draped in usual sterile fashion.  Under ultrasound guidance, I cannulated the right IJ with a micropuncture needle and a micropuncture wire was introduced.  The sheath was then introduced over the wire.  I  tried to advance the J-wire but this appeared to take a abnormal path and therefore I shot a venogram through the micropuncture sheath which demonstrated that this was in the azygous vein.  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  I therefore elected to place a left thigh tunneled dialysis catheter.  The left thigh had been prepped into the field.  Under ultrasound guidance I cannulated the left common femoral vein with a micropuncture needle and a micropuncture sheath was introduced over wire.  I then advanced the J-wire up into the right atrium.  I selected the exit site for the catheter and then the cath was brought through the tunnel.  The tract over the wire was dilated and then the dilator and peel-away sheath were advanced over the wire and the wire and dilator removed.  The catheter was passed through the peel-away sheath and positioned in the right atrium.  Both ports withdrew easily with and flushed with heparinized saline and filled with concentrated heparin.  The catheter was secured at its exit site with a 3-0 nylon suture.  The femoral cannulation site was closed with a 4-0 subcuticular stitch.  Sterile dressing was applied.  There is no further bleeding from the eschar I excised some old hematoma and then placed in 3-0 nylon horizontal mattress suture over the area which had bled.  Sterile dressing was applied.  Patient tolerated procedure well was transferred to recovery in stable condition.  All needle and sponge counts were correct.  [name], MD, FACS [facility]  DATE OF DICTATION:   05/11/2020

## 2020-05-11 NOTE — ED Provider Notes (Signed)
Emergency Department Provider Note  I have reviewed the triage vital signs and the nursing notes.  HISTORY  Chief Complaint Bleeding/Bruising   HPI Sue Neal is a 74 y.o. female with multiple medical problems as documented below who presents with bleeding from her left femoral dialysis graft.  Apparently patient last had dialysis on Thursday.  She states that she was picking at it tonight and then it started bleeding profusely.  She apparently was trying to get a hold of EMS and walking around the house and also blood all over the place.  EMS states there was a significant amount tolerating a liter or more.  When initially got her into the be evaluated her blood pressure was 60/40 to start some fluids put a pressure dressing on and brought here for further evaluation.  This time patient does still seem to be a little bit confused but has no other complaints besides neck pain.   No other associated or modifying symptoms.    Past Medical History:  Diagnosis Date  . Anemia   . Arthritis   . Back pain, chronic   . Blood transfusion without reported diagnosis   . Cataract   . Complication of anesthesia    " I woke up during the procedure."  . ESRD (end stage renal disease) on dialysis ([place])    "TTS; [employer]" (02/07/2016)  . Hyperparathyroidism, secondary ([place])   . Hypertension    off meds now  . 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 Uterine fibroid     Patient Active Problem List   Diagnosis Date Noted  . Acute blood loss anemia 05/11/2020  . Malfunction of arteriovenous graft ([place])   . Rotator cuff injury, left, initial encounter 07/17/2016  . Benign adenomatous polyp of large intestine 05/14/2016  . Diverticulosis of colon without hemorrhage 05/14/2016  . History of colonic polyps 05/14/2016  . Special screening for malignant neoplasms, colon   . ESRD (end stage renal disease)  ([place]) 02/07/2016  . Pain in limb 10/31/2013  . Numbness of fingers 07/19/2012  . Aftercare following surgery of the circulatory system, [place] 06/21/2012  . Other complications due to other vascular device, implant, and graft 06/21/2012  . End stage renal disease ([place]) 02/04/2012    Past Surgical History:  Procedure Laterality Date  . ARTERIOVENOUS GRAFT PLACEMENT Right 03/19/2000   upper arm  . ARTERIOVENOUS GRAFT PLACEMENT Left 12/03/2000   thigh  . BIOPSY  09/28/2019   Procedure: BIOPSY;  Surgeon: Milus Banister, MD;  Location: [facility];  Service: Endoscopy;;  . CARPAL TUNNEL RELEASE Left 03/10/2013   Procedure: CARPAL TUNNEL RELEASE;  Surgeon: Wynonia Sours, MD;  Location: [facility];  Service: Orthopedics;  Laterality: Left;  . CARPAL TUNNEL RELEASE Right 03/28/2015   Procedure: RIGHT CARPAL TUNNEL RELEASE;  Surgeon: Daryll Brod, MD;  Location: [facility];  Service: Orthopedics;  Laterality: Right;  ANESTHESIA:  IV REGIONAL FAB  . COLONOSCOPY W/ BIOPSIES    . COLONOSCOPY WITH PROPOFOL N/A 05/14/2016   Procedure: COLONOSCOPY WITH PROPOFOL;  Surgeon: Milus Banister, MD;  Location: [facility];  Service: Endoscopy;  Laterality: N/A;  . COLONOSCOPY WITH PROPOFOL N/A 09/28/2019   Procedure: COLONOSCOPY WITH PROPOFOL;  Surgeon: Milus Banister, MD;  Location: [facility];  Service: Endoscopy;  Laterality: N/A;  HARD STICK-DIALYSIS PATIENT  . HEMICOLECTOMY  02/1996  . HERNIA  REPAIR     laparoscopic repair during a [place] hospitalization from 03/26/2006-03/30/2006  . INCISIONAL HERNIA REPAIR  04/15/2006   laparoscopic  . INSERTION OF DIALYSIS CATHETER Left 06/06/2000   IJ [place] catheter  . INSERTION OF DIALYSIS CATHETER Right 06/28/2000   IJ Ash catheter  . INSERTION OF DIALYSIS CATHETER Left 08/13/2000   subclavian Ash catheter  . multiple failed grafts     left thigh AVG 12/03/00, clotted -05/31/03, 01/24/04, 08/28/04, 09/06/04( thrombectomy and revision )Left  AVG declot procedure including complete AV shuntogram, 08/29/04 left AV thrombolysis and angioplasty 2012 shunto gram to left thigh AVG  . POLYPECTOMY  09/28/2019   Procedure: POLYPECTOMY;  Surgeon: Milus Banister, MD;  Location: [facility];  Service: Endoscopy;;  . REMOVAL OF A DIALYSIS CATHETER  05/31/2000   Schon catheter  . REVISION OF ARTERIOVENOUS GORETEX GRAFT Left 06/23/2012   thigh; with exc. pseudoaneurysm  . REVISION OF ARTERIOVENOUS GORETEX GRAFT Left 02/07/2016   Procedure: REVISION OF LEFT THIGH ARTERIOVENOUS GORETEX GRAFT;  Surgeon: Angelia Mould, MD;  Location: [facility];  Service: Vascular;  Laterality: Left;  . SHUNTOGRAM Left 02/16/2012   thigh; with angioplasty, venous anastomosis; stent, medial graft pseudoaneurysm  . SHUNTOGRAM N/A 02/16/2012   Procedure: Earney Mallet;  Surgeon: Serafina Mitchell, MD;  Location: [facility];  Service: Cardiovascular;  Laterality: N/A;  . THROMBECTOMY / ARTERIOVENOUS GRAFT REVISION Left 02/07/2016   thigh  . THROMBECTOMY AND REVISION OF ARTERIOVENTOUS (AV) GORETEX  GRAFT Right 06/04/2000   upper arm  . THROMBECTOMY AND REVISION OF ARTERIOVENTOUS (AV) GORETEX  GRAFT Left 09/06/2004   thigh  . TRIGGER FINGER RELEASE Right 03/28/2015   Procedure: RELEASE A-1 PULLEY RIGHT THUMB;  Surgeon: Daryll Brod, MD;  Location: [place];  Service: Orthopedics;  Laterality: Right;    Current Outpatient Rx  . Order #: [id number] Class: Historical Med  . Order #: [id number] Class: Historical Med  . Order #: [id number] Class: Historical Med  . Order #: [id number] Class: Historical Med  . Order #: [id number] Class: Historical Med  . Order #: [id number] Class: Historical Med  . Order #: [id number] Class: Historical Med  . Order #: [id number] Class: Historical Med  . Order #: [id number] Class: Print    Allergies Patient has no known allergies.  Family History  Problem Relation Age of Onset  . Colon cancer Mother 62  . Diabetes Cousin   . Breast cancer  Maternal Aunt   . Breast cancer Cousin     Social History Social History   Tobacco Use  . Smoking status: Former Smoker    Packs/day: 0.10    Types: Cigarettes    Quit date: 02/03/1978    Years since quitting: 42.2  . Smokeless tobacco: Never Used  Substance Use Topics  . Alcohol use: Yes    Alcohol/week: 0.0 standard drinks    Comment: rare-once a year  . Drug use: No    Review of Systems  All other systems negative except as documented in the HPI. All pertinent positives and negatives as reviewed in the HPI. ____________________________________________  PHYSICAL EXAM:  VITAL SIGNS: ED Triage Vitals  Enc Vitals Group     BP 05/11/20 0108 (!) 72/56     Pulse Rate 05/11/20 0108 72     Resp 05/11/20 0108 (!) 8     Temp --      Temp Source 05/11/20 0108 Oral     SpO2 05/11/20 0108 100 %     Weight 05/11/20 0111 185 lb (83.9 kg)  Height 05/11/20 0111 5\' 5"  (1.651 m)    Constitutional: Alert and oriented. Well appearing and in no acute distress. Eyes: Conjunctivae are normal. PERRL. EOMI. Head: Atraumatic. Nose: No congestion/rhinnorhea. Mouth/Throat: Mucous membranes are moist.  Oropharynx non-erythematous. Neck: No stridor.  No meningeal signs.   Cardiovascular: Tachycardic rate, regular rhythm. Good peripheral circulation. Grossly normal heart sounds.   Respiratory: Normal respiratory effort.  No retractions. Lungs CTAB. Gastrointestinal: Soft and nontender. No distention.  Musculoskeletal: No lower extremity tenderness nor edema. No gross deformities of extremities. Neurologic:  Normal speech and language. No gross focal neurologic deficits are appreciated.  Skin:  Skin is warm, dry and intact. No rash noted.  Swelling noted to her left groin in the area of her graft.  She has distal pulses.  Is a pressure dressing in place after pulling it back there is a Band-Aid there does not appear to be oozing or bleeding at this  time.  ____________________________________________   LABS (all labs ordered are listed, but only abnormal results are displayed)  Labs Reviewed  CBC WITH DIFFERENTIAL/PLATELET - Abnormal; Notable for the following components:      Result Value   RBC 3.09 (*)    Hemoglobin 9.0 (*)    HCT 31.3 (*)    MCV 101.3 (*)    MCHC 28.8 (*)    RDW 15.9 (*)    Platelets 118 (*)    Lymphs Abs 4.8 (*)    All other components within normal limits  COMPREHENSIVE METABOLIC PANEL - Abnormal; Notable for the following components:   Glucose, Bld 144 (*)    BUN 28 (*)    Creatinine, Ser 8.36 (*)    Calcium 7.7 (*)    Total Protein 5.2 (*)    Albumin 2.7 (*)    GFR calc non Af Amer 4 (*)    GFR calc Af Amer 5 (*)    All other components within normal limits  I-STAT CHEM 8, ED - Abnormal; Notable for the following components:   BUN 35 (*)    Creatinine, Ser 7.80 (*)    Glucose, Bld 128 (*)    Calcium, Ion 0.87 (*)    Hemoglobin 10.2 (*)    HCT 30.0 (*)    All other components within normal limits  SARS CORONAVIRUS 2 BY RT PCR (HOSPITAL ORDER, [place] [facility])  PROTIME-INR  BASIC METABOLIC PANEL  CBC  TYPE AND SCREEN  ABO/RH  PREPARE RBC (CROSSMATCH)   ____________________________________________  RADIOLOGY  No results found. ____________________________________________  PROCEDURES  Procedure(s) performed:   .Critical Care Performed by: Merrily Pew, MD Authorized by: Merrily Pew, MD   Critical care provider statement:    Critical care time (minutes):  45   Critical care was necessary to treat or prevent imminent or life-threatening deterioration of the following conditions:  Circulatory failure   Critical care was time spent personally by me on the following activities:  Discussions with consultants, evaluation of patient's response to treatment, examination of patient, ordering and performing treatments and interventions, ordering and review of  laboratory studies, ordering and review of radiographic studies, pulse oximetry, re-evaluation of patient's condition, obtaining history from patient or surrogate and review of old charts   ____________________________________________  INITIAL IMPRESSION / [place] / ED COURSE  This patient presents to the ED for concern of bleeding from fistula, this involves an extensive number of treatment options, and is a complaint that carries with it a high risk of complications  and morbidity.  The differential diagnosis includes bleeding.  Type and screen will be sent, i-STAT Chem-8 to evaluate her hemoglobin will be evaluated rapidly.  At this time her blood pressure and map are okay we will not transfuse emergently.    Lab Tests:   I Ordered, reviewed, and interpreted labs, which included cbc, cmp, pt inr which ultimately ended up looking ok aside from 5 point hemoglobin drop.    Medicines ordered:   I ordered medication blood  For assumed shock 2/2 hemorrhagic anemia   Imaging Studies ordered:   I independently visualized and interpreted imaging none which showed none  Additional history obtained:   Additional history obtained from EMS  Previous records obtained and reviewed in epic  Consultations Obtained:   I consulted vascular surgery  and discussed lab and imaging findings, will plan for tunneled catheter, graft removal later today.  I consulted hospitalist for admission when BP stabilized.   Reevaluation:  After the interventions stated above, I reevaluated the patient and found her blood pressure had come down to 70s over 50s.  She is diaphoretic.  At this time patient is much less stable than she was earlier so we will emergently transfused 2 units of blood and also type and screen to units of blood.  I have discussed with Dr. Doren Custard who has evaluated her pending recommendations.  Critical Interventions: Emergent blood transfusion    ____________________________________________  FINAL CLINICAL IMPRESSION(S) / ED DIAGNOSES  Final diagnoses:  ESRD (end stage renal disease) (HCC)  Hypotension, unspecified hypotension type  Malfunction of arteriovenous graft, initial encounter ([place])    MEDICATIONS GIVEN DURING THIS VISIT:  Medications  ceFAZolin (ANCEF) IVPB 2g/100 mL premix (has no administration in time range)  ondansetron (ZOFRAN) tablet 4 mg (has no administration in time range)    Or  ondansetron (ZOFRAN) injection 4 mg (has no administration in time range)  fentaNYL (SUBLIMAZE) injection 50 mcg (has no administration in time range)  lactated ringers bolus 500 mL (has no administration in time range)  lactated ringers bolus 500 mL (0 mLs Intravenous Stopped 05/11/20 0212)  0.9 %  sodium chloride infusion (Manually program via Guardrails IV Fluids) ( Intravenous Stopped 05/11/20 0443)  lactated ringers bolus 500 mL (0 mLs Intravenous Stopped 05/11/20 0443)  lactated ringers bolus 500 mL (500 mLs Intravenous New Bag/Given 05/11/20 0443)    NEW OUTPATIENT MEDICATIONS STARTED DURING THIS VISIT:  New Prescriptions   No medications on file    Note:  This note was prepared with assistance of Dragon voice recognition software. Occasional wrong-word or sound-a-like substitutions may have occurred due to the inherent limitations of voice recognition software.   Delesha Pohlman, Corene Cornea, MD 05/11/20 [phone]

## 2020-05-11 NOTE — ED Triage Notes (Signed)
Pt. From home arrived [facility] with c/o lying in bed and picking at her femoral cath until it starting bleeding. Per EMS, pt. Loss about L of blood before calling EMS. B/P initially 60/40 and pt. Slightly confused. EMS administered 564mL NS and applied a trauma bandage to L leg to stop bleeding. Last set of vitals b/p 90/50, HR 104, 100 RA, CBG 136

## 2020-05-11 NOTE — Anesthesia Postprocedure Evaluation (Signed)
Anesthesia Post Note  Patient: Sue Neal  Procedure(s) Performed: Ultrasound-guided access to the right internal jugular vein with venogram right IJ and azygous vein 2.  Placement of left femoral 55 cm tunneled dialysis catheter under ultrasound guidance  (Left ) Repair of ulceration left thigh arteriovenous graft (Left Thigh)     Patient location during evaluation: PACU Anesthesia Type: General Level of consciousness: awake and alert Pain management: pain level controlled Vital Signs Assessment: post-procedure vital signs reviewed and stable Respiratory status: spontaneous breathing, nonlabored ventilation and respiratory function stable Cardiovascular status: blood pressure returned to baseline and stable Postop Assessment: no apparent nausea or vomiting Anesthetic complications: no   No complications documented.  Last Vitals:  Vitals:   05/11/20 1350 05/11/20 1420  BP: (!) 102/45 (!) 105/47  Pulse: 80 73  Resp: (!) 22 (!) 22  Temp:    SpO2: 100% 100%    Last Pain:  Vitals:   05/11/20 1335  TempSrc:   PainSc: 0-No pain                 Donoven Pett,W. EDMOND

## 2020-05-11 NOTE — Anesthesia Procedure Notes (Signed)
Procedure Name: Intubation Date/Time: 05/11/2020 12:09 PM Performed by: Leonor Liv, CRNA Pre-anesthesia Checklist: Patient identified, Emergency Drugs available, Suction available, Patient being monitored and Timeout performed Patient Re-evaluated:Patient Re-evaluated prior to induction Oxygen Delivery Method: Circle system utilized Preoxygenation: Pre-oxygenation with 100% oxygen Induction Type: IV induction Ventilation: Oral airway inserted - appropriate to patient size and Mask ventilation without difficulty Laryngoscope Size: Mac and 3 Grade View: Grade II Tube type: Oral Tube size: 7.0 mm Number of attempts: 1 Placement Confirmation: ETT inserted through vocal cords under direct vision and breath sounds checked- equal and bilateral Secured at: 21 cm Tube secured with: Tape Dental Injury: Teeth and Oropharynx as per pre-operative assessment

## 2020-05-11 NOTE — Anesthesia Preprocedure Evaluation (Addendum)
Anesthesia Evaluation  Patient identified by MRN, date of birth, ID band Patient awake    Reviewed: Allergy & Precautions, H&P , NPO status , Patient's Chart, lab work & pertinent test results  Airway Mallampati: II  TM Distance: >3 FB Neck ROM: Full    Dental no notable dental hx. (+) Teeth Intact, Dental Advisory Given   Pulmonary neg pulmonary ROS, former smoker,    Pulmonary exam normal breath sounds clear to auscultation       Cardiovascular hypertension,  Rhythm:Regular Rate:Normal     Neuro/Psych negative neurological ROS  negative psych ROS   GI/Hepatic negative GI ROS, Neg liver ROS,   Endo/Other  negative endocrine ROS  Renal/GU ESRF and DialysisRenal disease  negative genitourinary   Musculoskeletal  (+) Arthritis , Osteoarthritis,    Abdominal   Peds  Hematology  (+) Blood dyscrasia, anemia ,   Anesthesia Other Findings   Reproductive/Obstetrics negative OB ROS                            Anesthesia Physical Anesthesia Plan  ASA: III  Anesthesia Plan: General   Post-op Pain Management:    Induction: Intravenous  PONV Risk Score and Plan: 4 or greater and Ondansetron, Dexamethasone and Treatment may vary due to age or medical condition  Airway Management Planned: Oral ETT  Additional Equipment:   Intra-op Plan:   Post-operative Plan: Extubation in OR  Informed Consent: I have reviewed the patients History and Physical, chart, labs and discussed the procedure including the risks, benefits and alternatives for the proposed anesthesia with the patient or authorized representative who has indicated his/her understanding and acceptance.     Dental advisory given  Plan Discussed with: CRNA  Anesthesia Plan Comments:         Anesthesia Quick Evaluation

## 2020-05-11 NOTE — Transfer of Care (Signed)
Immediate Anesthesia Transfer of Care Note  Patient: Sue Neal  Procedure(s) Performed: INSERTION OF DIALYSIS CATHETER Repair of Bleeding Site Left Thigh Arteriovenous Fistula (Left Thigh)  Patient Location: PACU  Anesthesia Type:General  Level of Consciousness: awake, alert  and oriented  Airway & Oxygen Therapy: Patient Spontanous Breathing and Patient connected to face mask oxygen  Post-op Assessment: Report given to RN, Post -op Vital signs reviewed and stable and Patient moving all extremities  Post vital signs: Reviewed and stable  Last Vitals:  Vitals Value Taken Time  BP 93/49 05/11/20 1333  Temp    Pulse 87 05/11/20 1337  Resp 11 05/11/20 1337  SpO2 100 % 05/11/20 1337  Vitals shown include unvalidated device data.  Last Pain:  Vitals:   05/11/20 0247  TempSrc: Oral  PainSc:          Complications: No complications documented.

## 2020-05-11 NOTE — Consult Note (Signed)
[facility] Renal Consultation Note    Indication for Consultation:  Management of ESRD/hemodialysis, anemia, hypertension/volume, and secondary hyperparathyroidism. PCP:  HPI: Sue Neal is a 74 y.o. female with ESRD, HTN, and osteoarthritis who was admitted for AVG dysfunction.  Presented to ED with bleeding from L thigh AVG. Per notes, the AVG had been on its last leg and deemed non salvageable if clotted again. VVS consulted, took to OR this morning for repair of the AVG ulceration and tunneled cath placement.  Seen in PACU afterwards, she is drowsy s/p procedure. Labs from this morning show Na 140, K 5.9, Ca 7.4, WBC 8.7, Hgb 10.6, Plts 82. She denies CP, dyspnea, N/V, or diarrhea. COVID negative today.  Dialyzes on TTS schedule at [facility] - she is due for dialysis today.  Past Medical History:  Diagnosis Date  . Anemia   . Arthritis   . Back pain, chronic   . Blood transfusion without reported diagnosis   . Cataract   . Complication of anesthesia    " I woke up during the procedure."  . ESRD (end stage renal disease) on dialysis ([place])    "TTS; [place]" (02/07/2016)  . Hyperparathyroidism, secondary ([place])   . Hypertension    off meds now  . 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 Uterine fibroid    Past Surgical History:  Procedure Laterality Date  . ARTERIOVENOUS GRAFT PLACEMENT Right 03/19/2000   upper arm  . ARTERIOVENOUS GRAFT PLACEMENT Left 12/03/2000   thigh  . BIOPSY  09/28/2019   Procedure: BIOPSY;  Surgeon: Milus Banister, MD;  Location: [facility];  Service: Endoscopy;;  . CARPAL TUNNEL RELEASE Left 03/10/2013   Procedure: CARPAL TUNNEL RELEASE;  Surgeon: Wynonia Sours, MD;  Location: [place];  Service: Orthopedics;  Laterality: Left;  . CARPAL TUNNEL RELEASE Right 03/28/2015   Procedure: RIGHT CARPAL TUNNEL RELEASE;  Surgeon: Daryll Brod,  MD;  Location: [place];  Service: Orthopedics;  Laterality: Right;  ANESTHESIA:  IV REGIONAL FAB  . COLONOSCOPY W/ BIOPSIES    . COLONOSCOPY WITH PROPOFOL N/A 05/14/2016   Procedure: COLONOSCOPY WITH PROPOFOL;  Surgeon: Milus Banister, MD;  Location: [facility];  Service: Endoscopy;  Laterality: N/A;  . COLONOSCOPY WITH PROPOFOL N/A 09/28/2019   Procedure: COLONOSCOPY WITH PROPOFOL;  Surgeon: Milus Banister, MD;  [facility];  Service: Endoscopy;  Laterality: N/A;  HARD STICK-DIALYSIS PATIENT  . HEMICOLECTOMY  02/1996  . HERNIA REPAIR     laparoscopic repair during a [place] hospitalization from 03/26/2006-03/30/2006  . INCISIONAL HERNIA REPAIR  04/15/2006   laparoscopic  . INSERTION OF DIALYSIS CATHETER Left 06/06/2000   IJ Quinton catheter  . INSERTION OF DIALYSIS CATHETER Right 06/28/2000   IJ Ash catheter  . INSERTION OF DIALYSIS CATHETER Left 08/13/2000   subclavian Ash catheter  . multiple failed grafts     left thigh AVG 12/03/00, clotted -05/31/03, 01/24/04, 08/28/04, 09/06/04( thrombectomy and revision )Left AVG declot procedure including complete AV shuntogram, 08/29/04 left AV thrombolysis and angioplasty 2012 shunto gram to left thigh AVG  . POLYPECTOMY  09/28/2019   Procedure: POLYPECTOMY;  Surgeon: Milus Banister, MD;  [facility]: [facility];  Service: Endoscopy;;  . REMOVAL OF A DIALYSIS CATHETER  05/31/2000   Schon catheter  . REVISION OF ARTERIOVENOUS GORETEX GRAFT Left 06/23/2012   thigh; with exc. pseudoaneurysm  .  REVISION OF ARTERIOVENOUS GORETEX GRAFT Left 02/07/2016   Procedure: REVISION OF LEFT THIGH ARTERIOVENOUS GORETEX GRAFT;  Surgeon: Angelia Mould, MD;  Location: [place];  Service: Vascular;  Laterality: Left;  . SHUNTOGRAM Left 02/16/2012   thigh; with angioplasty, venous anastomosis; stent, medial graft pseudoaneurysm  . SHUNTOGRAM N/A 02/16/2012   Procedure: Earney Mallet;  Surgeon: Serafina Mitchell, MD;  Location: [facility] LAB;   Service: Cardiovascular;  Laterality: N/A;  . THROMBECTOMY / ARTERIOVENOUS GRAFT REVISION Left 02/07/2016   thigh  . THROMBECTOMY AND REVISION OF ARTERIOVENTOUS (AV) GORETEX  GRAFT Right 06/04/2000   upper arm  . THROMBECTOMY AND REVISION OF ARTERIOVENTOUS (AV) GORETEX  GRAFT Left 09/06/2004   thigh  . TRIGGER FINGER RELEASE Right 03/28/2015   Procedure: RELEASE A-1 PULLEY RIGHT THUMB;  Surgeon: Daryll Brod, MD;  Location: [place];  Service: Orthopedics;  Laterality: Right;   Family History  Problem Relation Age of Onset  . Colon cancer Mother 6  . Diabetes Cousin   . Breast cancer Maternal Aunt   . Breast cancer Cousin    Social History:  reports that she quit smoking about 42 years ago. Her smoking use included cigarettes. She smoked 0.10 packs per day. She has never used smokeless tobacco. She reports current alcohol use. She reports that she does not use drugs.  ROS: As per HPI otherwise negative.  Physical Exam: Vitals:   05/11/20 0415 05/11/20 0430 05/11/20 1000 05/11/20 1045  BP: (!) 103/40 (!) 107/48 (!) 111/50 (!) 103/59  Pulse:   77 80  Resp:   (!) 22 20  Temp:      TempSrc:      SpO2:   96% 97%  Weight:      Height:         General: Well developed, well nourished, in no acute distress. Drowsy. Head: Normocephalic, atraumatic, sclera non-icteric, mucus membranes are moist. Neck: Supple without lymphadenopathy/masses. JVD not elevated. Lungs: Clear bilaterally to auscultation without wheezes, rales, or rhonchi.  Heart: RRR with normal S1, S2. No murmurs, rubs, or gallops appreciated. Abdomen: Soft, non-tender, non-distended with normoactive bowel sounds.  Musculoskeletal:  Strength and tone appear normal for age. Lower extremities: No edema or ischemic changes, no open wounds. Neuro: Alert and oriented X 3. Moves all extremities spontaneously. Psych:  Responds to questions appropriately with a normal affect. Dialysis Access: TDC in L thigh, bandaged  repair of AVG.  No Known Allergies Prior to Admission medications   Medication Sig Start Date End Date Taking? Authorizing Provider  acetaminophen (TYLENOL) 500 MG tablet Take 500-1,000 mg by mouth 2 (two) times daily as needed for moderate pain (for back pain).    Yes [provider]  ciclopirox (PENLAC) 8 % solution Apply 1 application topically at bedtime.  01/02/20  Yes [provider]  cinacalcet (SENSIPAR) 90 MG tablet Take 180 mg by mouth daily after supper.    Yes [provider]  diphenhydramine-acetaminophen (TYLENOL PM) 25-500 MG TABS tablet Take 1 tablet by mouth at bedtime as needed (Back pain).   Yes [provider]  ketotifen (ZADITOR) 0.025 % ophthalmic solution Place 1 drop into both eyes daily as needed (for dry eyes).   Yes [provider]  midodrine (PROAMATINE) 10 MG tablet Take 10 mg by mouth Every Tuesday,Thursday,and Saturday with dialysis. 02/06/20  Yes [provider]  Nutritional Supplements (FEEDING SUPPLEMENT, NEPRO CARB STEADY,) LIQD Take 237 mLs by mouth every Tuesday, Thursday, and Saturday at 6 PM.  Yes [provider]  sevelamer (RENVELA) 800 MG tablet Take 800-4,000 mg by mouth See admin instructions. Take 5 tablets with meals and take 2 tablets with snacks   Yes [provider]  NONFORMULARY OR COMPOUNDED ITEM [employer] compound: Nail Fungus - Complex Nail and Foot Disorders - Fluconazole 1%, DMSO 25%, Fluocinonide 0.05%, Ketoconazole 2%, apply 1-2 pumps to each affected nail beds and feet twice daily. Patient not taking: Reported on 09/20/2019 02/14/16   Trula Slade, DPM   Current Facility-Administered Medications  Medication Dose Route Frequency Provider Last Rate Last Admin  . 0.9 %  sodium chloride infusion   Intravenous Continuous Roderic Palau, MD      . Doug Sou Hold] ceFAZolin (ANCEF) IVPB 2g/100 mL premix  2 g Intravenous To SS-Surg Rise Patience, MD      . Doug Sou  Hold] Chlorhexidine Gluconate Cloth 2 % PADS 6 each  6 each Topical Q0600 Loren Racer, PA-C      . [MAR Hold] ondansetron (ZOFRAN) tablet 4 mg  4 mg Oral Q6H PRN Rise Patience, MD       Or  . Doug Sou Hold] ondansetron [facility]) injection 4 mg  4 mg Intravenous Q6H PRN Rise Patience, MD       Facility-Administered Medications Ordered in Other Encounters  Medication Dose Route Frequency Provider Last Rate Last Admin  . fentaNYL (SUBLIMAZE) injection   Intravenous Anesthesia Intra-op Leonor Liv, CRNA   100 mcg at 05/11/20 1144   Labs: Basic Metabolic Panel: Recent Labs  Lab 05/11/20 0110 05/11/20 0239 05/11/20 0616  NA 141 140 140  K 4.3 4.2 5.9*  CL 99 100 100  CO2 27  --  29  GLUCOSE 144* 128* 103*  BUN 28* 35* 27*  CREATININE 8.36* 7.80* 7.94*  CALCIUM 7.7*  --  7.4*   Liver Function Tests: Recent Labs  Lab 05/11/20 0110  AST 15  ALT 10  ALKPHOS 60  BILITOT 0.6  PROT 5.2*  ALBUMIN 2.7*   CBC: Recent Labs  Lab 05/11/20 0110 05/11/20 0239 05/11/20 0616  WBC 8.1  --  8.7  NEUTROABS 2.6  --   --   HGB 9.0* 10.2* 10.6*  HCT 31.3* 30.0* 34.1*  MCV 101.3*  --  96.1  PLT 118*  --  82*   Dialysis Orders:  TTS @ [place] 3:15hr, 450/A1.5, EDW 83.5kg, 2K/2Ca, AVG, heparin 5000 bolus - Hectoral 1mcg IV q HD - No ESA, Hgb 12.2 on 8/19  Assessment/Plan: 1.  AVG dysfunction/hemorrhage: S/p ulceration repair + TDC placement today by VVS. 2.  ESRD: Continue HD per TTS schedule - for HD later today after surgery, no heparin. 3.  Hypertension/volume: BP stable, no volume excess on exam. 4.  Anemia of ESRD: Hgb typically > 11. Lower today and anticipating post-op drop - follow closely. 5.  Metabolic bone disease: CorrCa ok, Phos pending. 6.  Nutrition:  Alb low, start supplements tomorrow.   Veneta Penton, PA-C 05/11/2020, 11:59 AM  [employer]

## 2020-05-11 NOTE — H&P (Signed)
History and Physical    Sue Neal LDJ:[id number] DOB: 05-21-1946 DOA: 05/11/2020  PCP: [facility]  Patient coming from: [place].  Chief Complaint: Bleeding AV graft.  HPI: Sue Neal is a 73 y.o. female with history of ESRD on hemodialysis Tuesday Thursdays and Saturday was brought to the ER the patient was noticed to have bleeding from the AV graft after patient picked on the graft.  Per the report patient almost lost a liter of blood was given fluid bolus and brought to the ER.  Denies chest pain or shortness of breath.  ED Course: In the ER patient was hypotensive and was started on 2 units of PRBC transfusion hemoglobin dropped from 14 g in January 20 21 and is presently around 9.  Vascular surgeon Dr. Doren Custard was consulted patient's bleeding stopped after placing compressive pads.  Covid test is pending.  Review of Systems: As per HPI, rest all negative.   Past Medical History:  Diagnosis Date  . Anemia   . Arthritis   . Back pain, chronic   . Blood transfusion without reported diagnosis   . Cataract   . Complication of anesthesia    " I woke up during the procedure."  . ESRD (end stage renal disease) on dialysis ([place])    "TTS; [place]" (02/07/2016)  . Hyperparathyroidism, secondary ([place])   . Hypertension    off meds now  . 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 Uterine fibroid     Past Surgical History:  Procedure Laterality Date  . ARTERIOVENOUS GRAFT PLACEMENT Right 03/19/2000   upper arm  . ARTERIOVENOUS GRAFT PLACEMENT Left 12/03/2000   thigh  . BIOPSY  09/28/2019   Procedure: BIOPSY;  Surgeon: Milus Banister, MD;  Location: [facility];  Service: Endoscopy;;  . CARPAL TUNNEL RELEASE Left 03/10/2013   Procedure: CARPAL TUNNEL RELEASE;  Surgeon: Wynonia Sours, MD;  Location: [place];  Service: Orthopedics;   Laterality: Left;  . CARPAL TUNNEL RELEASE Right 03/28/2015   Procedure: RIGHT CARPAL TUNNEL RELEASE;  Surgeon: Daryll Brod, MD;  Location: [place];  Service: Orthopedics;  Laterality: Right;  ANESTHESIA:  IV REGIONAL FAB  . COLONOSCOPY W/ BIOPSIES    . COLONOSCOPY WITH PROPOFOL N/A 05/14/2016   Procedure: COLONOSCOPY WITH PROPOFOL;  Surgeon: Milus Banister, MD;  [facility];  Service: Endoscopy;  Laterality: N/A;  . COLONOSCOPY WITH PROPOFOL N/A 09/28/2019   Procedure: COLONOSCOPY WITH PROPOFOL;  Surgeon: Milus Banister, MD;  [facility];  Service: Endoscopy;  Laterality: N/A;  HARD STICK-DIALYSIS PATIENT  . HEMICOLECTOMY  02/1996  . HERNIA REPAIR     laparoscopic repair during a [place] hospitalization from 03/26/2006-03/30/2006  . INCISIONAL HERNIA REPAIR  04/15/2006   laparoscopic  . INSERTION OF DIALYSIS CATHETER Left 06/06/2000   IJ Quinton catheter  . INSERTION OF DIALYSIS CATHETER Right 06/28/2000   IJ Ash catheter  . INSERTION OF DIALYSIS CATHETER Left 08/13/2000   subclavian Ash catheter  . multiple failed grafts     left thigh AVG 12/03/00, clotted -05/31/03, 01/24/04, 08/28/04, 09/06/04( thrombectomy and revision )Left AVG declot procedure including complete AV shuntogram, 08/29/04 left AV thrombolysis and angioplasty 2012 shunto gram to left thigh AVG  . POLYPECTOMY  09/28/2019   Procedure: POLYPECTOMY;  Surgeon: Milus Banister, MD;  [facility];  Service: Endoscopy;;  .  REMOVAL OF A DIALYSIS CATHETER  05/31/2000   Schon catheter  . REVISION OF ARTERIOVENOUS GORETEX GRAFT Left 06/23/2012   thigh; with exc. pseudoaneurysm  . REVISION OF ARTERIOVENOUS GORETEX GRAFT Left 02/07/2016   Procedure: REVISION OF LEFT THIGH ARTERIOVENOUS GORETEX GRAFT;  Surgeon: Angelia Mould, MD;  Location: [place];  Service: Vascular;  Laterality: Left;  . SHUNTOGRAM Left 02/16/2012   thigh; with angioplasty, venous anastomosis; stent, medial graft  pseudoaneurysm  . SHUNTOGRAM N/A 02/16/2012   Procedure: Earney Mallet;  Surgeon: Serafina Mitchell, MD;  Location: [facility] CATH LAB;  Service: Cardiovascular;  Laterality: N/A;  . THROMBECTOMY / ARTERIOVENOUS GRAFT REVISION Left 02/07/2016   thigh  . THROMBECTOMY AND REVISION OF ARTERIOVENTOUS (AV) GORETEX  GRAFT Right 06/04/2000   upper arm  . THROMBECTOMY AND REVISION OF ARTERIOVENTOUS (AV) GORETEX  GRAFT Left 09/06/2004   thigh  . TRIGGER FINGER RELEASE Right 03/28/2015   Procedure: RELEASE A-1 PULLEY RIGHT THUMB;  Surgeon: Daryll Brod, MD;  Location: [place];  Service: Orthopedics;  Laterality: Right;     reports that she quit smoking about 42 years ago. Her smoking use included cigarettes. She smoked 0.10 packs per day. She has never used smokeless tobacco. She reports current alcohol use. She reports that she does not use drugs.  No Known Allergies  Family History  Problem Relation Age of Onset  . Colon cancer Mother 55  . Diabetes Cousin   . Breast cancer Maternal Aunt   . Breast cancer Cousin     Prior to Admission medications   Medication Sig Start Date End Date Taking? Authorizing Provider  acetaminophen (TYLENOL) 500 MG tablet Take 500-1,000 mg by mouth 2 (two) times daily as needed for moderate pain (for back pain).    Yes [provider]  ciclopirox (PENLAC) 8 % solution Apply 1 application topically at bedtime.  01/02/20  Yes [provider]  cinacalcet (SENSIPAR) 90 MG tablet Take 180 mg by mouth daily after supper.    Yes [provider]  diphenhydramine-acetaminophen (TYLENOL PM) 25-500 MG TABS tablet Take 1 tablet by mouth at bedtime as needed (Back pain).   Yes [provider]  ketotifen (ZADITOR) 0.025 % ophthalmic solution Place 1 drop into both eyes daily as needed (for dry eyes).   Yes [provider]  midodrine (PROAMATINE) 10 MG tablet Take 10 mg by mouth Every Tuesday,Thursday,and Saturday with dialysis. 02/06/20   Yes [provider]  Nutritional Supplements (FEEDING SUPPLEMENT, NEPRO CARB STEADY,) LIQD Take 237 mLs by mouth every Tuesday, Thursday, and Saturday at 6 PM.    Yes [provider]  sevelamer (RENVELA) 800 MG tablet Take 800-4,000 mg by mouth See admin instructions. Take 5 tablets with meals and take 2 tablets with snacks   Yes [provider]  NONFORMULARY OR COMPOUNDED ITEM [employer] compound: Nail Fungus - Complex Nail and Foot Disorders - Fluconazole 1%, DMSO 25%, Fluocinonide 0.05%, Ketoconazole 2%, apply 1-2 pumps to each affected nail beds and feet twice daily. Patient not taking: Reported on 09/20/2019 02/14/16   Trula Slade, DPM    Physical Exam: Constitutional: Moderately built and nourished. Vitals:   05/11/20 0247 05/11/20 0300 05/11/20 0315 05/11/20 0330  BP: (!) 84/54 (!) 84/47 (!) 92/41 (!) 97/52  Pulse: 84 84 80 76  Resp: (!) 24 (!) 23 (!) 23 (!) 24  Temp: (!) 97.2 F (36.2 C)     TempSrc: Oral     SpO2: 100% 100% 100% 100%  Weight:      Height:       Eyes: Anicteric no pallor. ENMT: No discharge from the ears eyes nose or mouth. Neck: No mass felt.  No neck rigidity. Respiratory: No rhonchi or crepitations. Cardiovascular: S1-S2 heard. Abdomen: Soft nontender bowel sounds present. Musculoskeletal: Left thigh dressing done. Skin: Left thigh dressing. Neurologic: Alert awake oriented to time place and person.  Moves all extremities. Psychiatric: Appears normal.   Labs on Admission: I have personally reviewed following labs and imaging studies  CBC: Recent Labs  Lab 05/11/20 0110 05/11/20 0239  WBC 8.1  --   NEUTROABS 2.6  --   HGB 9.0* 10.2*  HCT 31.3* 30.0*  MCV 101.3*  --   PLT 118*  --    Basic Metabolic Panel: Recent Labs  Lab 05/11/20 0110 05/11/20 0239  NA 141 140  K 4.3 4.2  CL 99 100  CO2 27  --   GLUCOSE 144* 128*  BUN 28* 35*  CREATININE 8.36* 7.80*  CALCIUM 7.7*  --    GFR: Estimated Creatinine  Clearance: 6.8 mL/min (A) (by C-G formula based on SCr of 7.8 mg/dL (H)). Liver Function Tests: Recent Labs  Lab 05/11/20 0110  AST 15  ALT 10  ALKPHOS 60  BILITOT 0.6  PROT 5.2*  ALBUMIN 2.7*   No results for input(s): LIPASE, AMYLASE in the last 168 hours. No results for input(s): AMMONIA in the last 168 hours. Coagulation Profile: Recent Labs  Lab 05/11/20 0110  INR 1.1   Cardiac Enzymes: No results for input(s): CKTOTAL, CKMB, CKMBINDEX, TROPONINI in the last 168 hours. BNP (last 3 results) No results for input(s): PROBNP in the last 8760 hours. HbA1C: No results for input(s): HGBA1C in the last 72 hours. CBG: No results for input(s): GLUCAP in the last 168 hours. Lipid Profile: No results for input(s): CHOL, HDL, LDLCALC, TRIG, CHOLHDL, LDLDIRECT in the last 72 hours. Thyroid Function Tests: No results for input(s): TSH, T4TOTAL, FREET4, T3FREE, THYROIDAB in the last 72 hours. Anemia Panel: No results for input(s): VITAMINB12, FOLATE, FERRITIN, TIBC, IRON, RETICCTPCT in the last 72 hours. Urine analysis: No results found for: COLORURINE, APPEARANCEUR, LABSPEC, PHURINE, GLUCOSEU, HGBUR, BILIRUBINUR, KETONESUR, PROTEINUR, UROBILINOGEN, NITRITE, LEUKOCYTESUR Sepsis Labs: @LABRCNTIP (procalcitonin:4,lacticidven:4) )No results found for this or any previous visit (from the past 240 hour(s)).   Radiological Exams on Admission: No results found.  EKG: Independently reviewed.  Normal sinus rhythm.  Assessment/Plan Principal Problem:   Acute blood loss anemia Active Problems:   End stage renal disease (HCC)    1. Bleeding AV graft presently controlled with compression.  Appreciate vascular surgery consult.  Planning to take patient for surgery. 2. Acute blood loss anemia secondary to bleeding AV graft for which 2 units of PRBC transfusion has been given.  Follow CBC. 3. ESRD on hemodialysis on Tuesday Thursday Saturday will need dialysis today likely after receiving  blood.  Vascular surgery is planning to put a new dialysis access.  Since patient has acute blood loss anemia with bleeding graft and will need access to dialysis will need inpatient status.  Covid test is pending.   DVT prophylaxis: SCD.  Avoiding anticoagulation due to bleeding. Code Status: Full code. Family Communication: Discussed with patient. Disposition Plan: Back to facility when stable. Consults called: Vascular surgery. Admission status: Inpatient.   Rise Patience MD [facility] Pager [phone].  If 7PM-7AM, please contact night-coverage [email or web address] Password [facility]  05/11/2020, 4:41 AM

## 2020-05-11 NOTE — Progress Notes (Signed)
PROGRESS NOTE    Sue Neal  GEZ:[id number] DOB: [birth year] DOA: 05/11/2020 PCP: [facility]    Brief Narrative:  Sue Neal is a 74 y.o. female with history of ESRD on hemodialysis T/T/S was brought to the ER the patient was noticed to have bleeding from the AV graft after patient picked on the graft.  Per the report patient almost lost a liter of blood was given fluid bolus and brought to the ER.  Denies chest pain or shortness of breath.  In the ER patient was hypotensive and was started on 2 units of PRBC transfusion hemoglobin dropped from 14 g in January 2021 and is presently around 9.  Vascular surgeon Dr. Scot Dock was consulted for evaluation of AVG site compromise. Patient's bleeding stopped after placing compressive pads.   Assessment & Plan:   Principal Problem:   Acute blood loss anemia Active Problems:   End stage renal disease (HCC)   Acute blood loss anemia secondary to AV graft bleeding/compromise Patient presenting to the ED following active bleeding from left femoral AV graft site. Was evaluated by vascular surgery, Dr. Scot Dock and underwent repair of the ulceration of left AV graft sites and placement of a left femoral tunneled dialysis catheter on 05/11/2020. --s/p 2u pRBC 8/21 --Hgb 9.0>10.6 --Will need eventual placement of a new right thigh graft once she recovers from this acute event per vascular surgery --Repeat hemoglobin in a.m.  ESRD on HD T/T/S Patient dialyzes at [facility]. --Nephrology consulted --Continue midodrine with HD --Plan HD today to resume normal schedule   DVT prophylaxis: SCDs Code Status: Full code Family Communication: No family present at bedside this morning  Disposition Plan:  Status is: Inpatient  Remains inpatient appropriate because:Hemodynamically unstable, Ongoing diagnostic testing needed not appropriate for outpatient work up and Inpatient level of care appropriate due to severity of  illness   Dispo: The patient is from: Home              Anticipated d/c is to: Home              Anticipated d/c date is: 1 day              Patient currently is not medically stable to d/c.   Consultants:   Vascular surgery  Nephrology  Procedures:   Left AV thigh graft repair 8/21  Right femoral tunneled HD catheter placement 8/21  Antimicrobials:   Perioperative cefazolin   Subjective: Patient seen and examined at bedside, resting comfortably in ED holding area.  Awaiting to go to the OR this morning for repair of her compromise left thigh AV graft, currently bleeding under control with compressive dressing.  No other complaints or concerns at this time.  Denies headache, no fever/chills/night sweats, no nausea/vomiting/diarrhea, no chest pain, palpitations, no shortness of breath.  No acute events overnight per nursing staff.  Objective: Vitals:   05/11/20 1000 05/11/20 1045 05/11/20 1335 05/11/20 1350  BP: (!) 111/50 (!) 103/59 (!) 93/49 (!) 102/45  Pulse: 77 80 85 80  Resp: (!) 22 20 11  (!) 22  Temp:   98 F (36.7 C)   TempSrc:      SpO2: 96% 97% 99% 100%  Weight:      Height:        Intake/Output Summary (Last 24 hours) at 05/11/2020 1408 Last data filed at 05/11/2020 1314 Gross per 24 hour  Intake 1250 ml  Output 25 ml  Net 1225 ml  Filed Weights   05/11/20 0111  Weight: 83.9 kg    Examination:  General exam: Appears calm and comfortable  Respiratory system: Clear to auscultation. Respiratory effort normal.  Oxygenating well on room air Cardiovascular system: S1 & S2 heard, RRR. No JVD, murmurs, rubs, gallops or clicks. No pedal edema. Gastrointestinal system: Abdomen is nondistended, soft and nontender. No organomegaly or masses felt. Normal bowel sounds heard. Central nervous system: Alert and oriented. No focal neurological deficits. Extremities: Symmetric 5 x 5 power.  Left thigh with dressing in place with old blood present Skin: No rashes,  lesions or ulcers Psychiatry: Judgement and insight appear normal. Mood & affect appropriate.     Data Reviewed: I have personally reviewed following labs and imaging studies  CBC: Recent Labs  Lab 05/11/20 0110 05/11/20 0239 05/11/20 0616  WBC 8.1  --  8.7  NEUTROABS 2.6  --   --   HGB 9.0* 10.2* 10.6*  HCT 31.3* 30.0* 34.1*  MCV 101.3*  --  96.1  PLT 118*  --  82*   Basic Metabolic Panel: Recent Labs  Lab 05/11/20 0110 05/11/20 0239 05/11/20 0616  NA 141 140 140  K 4.3 4.2 5.9*  CL 99 100 100  CO2 27  --  29  GLUCOSE 144* 128* 103*  BUN 28* 35* 27*  CREATININE 8.36* 7.80* 7.94*  CALCIUM 7.7*  --  7.4*   GFR: Estimated Creatinine Clearance: 6.7 mL/min (A) (by C-G formula based on SCr of 7.94 mg/dL (H)). Liver Function Tests: Recent Labs  Lab 05/11/20 0110  AST 15  ALT 10  ALKPHOS 60  BILITOT 0.6  PROT 5.2*  ALBUMIN 2.7*   No results for input(s): LIPASE, AMYLASE in the last 168 hours. No results for input(s): AMMONIA in the last 168 hours. Coagulation Profile: Recent Labs  Lab 05/11/20 0110  INR 1.1   Cardiac Enzymes: No results for input(s): CKTOTAL, CKMB, CKMBINDEX, TROPONINI in the last 168 hours. BNP (last 3 results) No results for input(s): PROBNP in the last 8760 hours. HbA1C: No results for input(s): HGBA1C in the last 72 hours. CBG: No results for input(s): GLUCAP in the last 168 hours. Lipid Profile: No results for input(s): CHOL, HDL, LDLCALC, TRIG, CHOLHDL, LDLDIRECT in the last 72 hours. Thyroid Function Tests: No results for input(s): TSH, T4TOTAL, FREET4, T3FREE, THYROIDAB in the last 72 hours. Anemia Panel: No results for input(s): VITAMINB12, FOLATE, FERRITIN, TIBC, IRON, RETICCTPCT in the last 72 hours. Sepsis Labs: No results for input(s): PROCALCITON, LATICACIDVEN in the last 168 hours.  Recent Results (from the past 240 hour(s))  SARS Coronavirus 2 by RT PCR (hospital order, performed in [facility] hospital lab)  Nasopharyngeal Nasopharyngeal Swab     Status: None   Collection Time: 05/11/20  4:55 AM   Specimen: Nasopharyngeal Swab  Result Value Ref Range Status   SARS Coronavirus 2 NEGATIVE NEGATIVE Final    Comment: (NOTE) SARS-CoV-2 target nucleic acids are NOT DETECTED.  The SARS-CoV-2 RNA is generally detectable in upper and lower respiratory specimens during the acute phase of infection. The lowest concentration of SARS-CoV-2 viral copies this assay can detect is 250 copies / mL. A negative result does not preclude SARS-CoV-2 infection and should not be used as the sole basis for treatment or other patient management decisions.  A negative result may occur with improper specimen collection / handling, submission of specimen other than nasopharyngeal swab, presence of viral mutation(s) within the areas targeted by this assay, and inadequate  number of viral copies (<250 copies / mL). A negative result must be combined with clinical observations, patient history, and epidemiological information.  Fact Sheet for Patients:   StrictlyIdeas.no  Fact Sheet for Healthcare Providers: BankingDealers.co.za  This test is not yet approved or  cleared by the [place] FDA and has been authorized for detection and/or diagnosis of SARS-CoV-2 by FDA under an Emergency Use Authorization (EUA).  This EUA will remain in effect (meaning this test can be used) for the duration of the COVID-19 declaration under Section 564(b)(1) of the Act, 21 U.S.C. section 360bbb-3(b)(1), unless the authorization is terminated or revoked sooner.  Performed at [facility], [place] [address], [place], [place] [address]          Radiology Studies: DG Fluoro Guide CV Line-No Report  Result Date: 05/11/2020 Fluoroscopy was utilized by the requesting physician.  No radiographic interpretation.        Scheduled Meds: . [START ON 05/12/2020] (feeding  supplement) PROSource Plus  30 mL Oral BID BM  . [MAR Hold] Chlorhexidine Gluconate Cloth  6 each Topical Q0600   Continuous Infusions: . sodium chloride       LOS: 0 days    Time spent: 36 minutes spent on chart review, discussion with nursing staff, consultants, updating family and interview/physical exam; more than 50% of that time was spent in counseling and/or coordination of care.    Rumaisa Schnetzer J [place] DO [facility] Available via Epic secure chat 7am-7pm After these hours, please refer to coverage provider listed on [email or web address] 05/11/2020, 2:08 PM

## 2020-05-11 NOTE — Interval H&P Note (Signed)
History and Physical Interval Note:  05/11/2020 11:11 AM  Sue Neal  has presented today for surgery, with the diagnosis of ESRD.  The various methods of treatment have been discussed with the patient and family. After consideration of risks, benefits and other options for treatment, the patient has consented to  Procedure(s): EXPLORATION THIGH AVG, PLACEMENT TDC (Left) as a surgical intervention.  The patient's history has been reviewed, patient examined, no change in status, stable for surgery.  I have reviewed the patient's chart and labs.  Questions were answered to the patient's satisfaction.     [name]

## 2020-05-11 NOTE — H&P (View-Only) (Signed)
REASON FOR CONSULT:    Bleeding from left thigh AV graft.  The consult is requested by Dr. Dayna Barker.  ASSESSMENT & PLAN:   END-STAGE RENAL DISEASE: This patient has a degenerative left thigh AV graft.  She has previously been told by nephrology that she will need placement of a tunneled dialysis catheter as the graft cannot be salvaged if it clots again.  She developed bleeding from her graft tonight and a pressure dressing was applied and her graft is now occluded.  Given that the graft is markedly degenerative and has been worked on multiple times I would recommend exploring the area that bled to be sure that we can cover any exposed graft here and placing a tunneled dialysis catheter.  She can then be reevaluated for new access, potentially a right thigh AV graft.  I have ordered a Covid test.  We will plan on surgery later today.   Sue Mayo, MD Office: [phone]   HPI:   Sue Neal is a pleasant 74 y.o. female, he dialyzes on Tuesdays Thursdays and Saturdays.  She has a left thigh AV graft.  She was told by nephrology that the graft is clotted multiple times and if it clotted again she will need placement of a tunneled dialysis catheter as the graft cannot be salvaged.  She was picking at something overlying her graft on the medial aspect tonight and developed significant bleeding.  She was brought by EMS.  Reportedly there was significant blood loss at the scene and initial blood pressure was 60/40.  Pressure dressing has been applied to the left thigh AV graft and the bleeding had stopped.  Past Medical History:  Diagnosis Date  . Anemia   . Arthritis   . Back pain, chronic   . Blood transfusion without reported diagnosis   . Cataract   . Complication of anesthesia    " I woke up during the procedure."  . ESRD (end stage renal disease) on dialysis ([place])    "TTS; [facility]" (02/07/2016)  . Hyperparathyroidism, secondary ([place])   . Hypertension    off  meds now  . 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 Uterine fibroid     Family History  Problem Relation Age of Onset  . Colon cancer Mother 10  . Diabetes Cousin   . Breast cancer Maternal Aunt   . Breast cancer Cousin     SOCIAL HISTORY: Social History   Socioeconomic History  . Marital status: Divorced    Spouse name: Not on file  . Number of children: 0  . Years of education: 66  . Highest education level: Not on file  Occupational History  . Occupation: Retired   Tobacco Use  . Smoking status: Former Smoker    Packs/day: 0.10    Types: Cigarettes    Quit date: 02/03/1978    Years since quitting: 42.2  . Smokeless tobacco: Never Used  Substance and Sexual Activity  . Alcohol use: Yes    Alcohol/week: 0.0 standard drinks    Comment: rare-once a year  . Drug use: No  . Sexual activity: Not on file  Other Topics Concern  . Not on file  Social History Narrative   Fun: Bowel, read, try new restaurants.    Denies abuse and feels safe at home.    Social Determinants of Health   Financial Resource Strain:   . Difficulty  of Paying Living Expenses: Not on file  Food Insecurity:   . Worried About Charity fundraiser in the Last Year: Not on file  . Ran Out of Food in the Last Year: Not on file  Transportation Needs:   . Lack of Transportation (Medical): Not on file  . Lack of Transportation (Non-Medical): Not on file  Physical Activity:   . Days of Exercise per Week: Not on file  . Minutes of Exercise per Session: Not on file  Stress:   . Feeling of Stress : Not on file  Social Connections:   . Frequency of Communication with Friends and Family: Not on file  . Frequency of Social Gatherings with Friends and Family: Not on file  . Attends Religious Services: Not on file  . Active Member of Clubs or Organizations: Not on file  . Attends Archivist Meetings: Not on file  .  Marital Status: Not on file  Intimate Partner Violence:   . Fear of Current or Ex-Partner: Not on file  . Emotionally Abused: Not on file  . Physically Abused: Not on file  . Sexually Abused: Not on file    No Known Allergies  Current Facility-Administered Medications  Medication Dose Route Frequency Provider Last Rate Last Admin  . 0.9 %  sodium chloride infusion (Manually program via Guardrails IV Fluids)   Intravenous Once Mesner, Corene Cornea, MD      . Derrill Memo ON 05/12/2020] ceFAZolin (ANCEF) IVPB 2g/100 mL premix  2 g Intravenous On Call Angelia Mould, MD      . lactated ringers bolus 500 mL  500 mL Intravenous Once Mesner, Corene Cornea, MD       Current Outpatient Medications  Medication Sig Dispense Refill  . acetaminophen (TYLENOL) 500 MG tablet Take 500-1,000 mg by mouth 2 (two) times daily as needed for moderate pain (for back pain).     . cinacalcet (SENSIPAR) 90 MG tablet Take 180 mg by mouth daily after supper.     . diphenhydramine-acetaminophen (TYLENOL PM) 25-500 MG TABS tablet Take 1 tablet by mouth at bedtime as needed (Back pain).    Marland Kitchen ketotifen (ZADITOR) 0.025 % ophthalmic solution Place 1 drop into both eyes daily as needed (for dry eyes).    . NONFORMULARY OR COMPOUNDED ITEM Pharmazen compound: Nail Fungus - Complex Nail and Foot Disorders - Fluconazole 1%, DMSO 25%, Fluocinonide 0.05%, Ketoconazole 2%, apply 1-2 pumps to each affected nail beds and feet twice daily. (Patient not taking: Reported on 09/20/2019) 120 each 11  . Nutritional Supplements (FEEDING SUPPLEMENT, NEPRO CARB STEADY,) LIQD Take 237 mLs by mouth every Tuesday, Thursday, and Saturday at 6 PM.     . sevelamer (RENVELA) 800 MG tablet Take 800-4,000 mg by mouth See admin instructions. 1600 mg with snacks and 4000 mg with meals      REVIEW OF SYSTEMS:  [X]  denotes positive finding, [ ]  denotes negative finding Cardiac  Comments:  Chest pain or chest pressure:    Shortness of breath upon exertion:      Short of breath when lying flat:    Irregular heart rhythm:        Vascular    Pain in calf, thigh, or hip brought on by ambulation:    Pain in feet at night that wakes you up from your sleep:     Blood clot in your veins:    Leg swelling:         Pulmonary    Oxygen at home:  Productive cough:     Wheezing:         Neurologic    Sudden weakness in arms or legs:     Sudden numbness in arms or legs:     Sudden onset of difficulty speaking or slurred speech:    Temporary loss of vision in one eye:     Problems with dizziness:         Gastrointestinal    Blood in stool:     Vomited blood:         Genitourinary    Burning when urinating:     Blood in urine:        Psychiatric    Major depression:         Hematologic    Bleeding problems:    Problems with blood clotting too easily:        Skin    Rashes or ulcers:        Constitutional    Fever or chills:     PHYSICAL EXAM:   Vitals:   05/11/20 0108 05/11/20 0111  BP: (!) 72/56   Pulse: 72   Resp: (!) 8   TempSrc: Oral   SpO2: 100%   Weight:  83.9 kg  Height:  5\' 5"  (1.651 m)    GENERAL: The patient is a well-nourished female, in no acute distress. The vital signs are documented above. CARDIAC: There is a regular rate and rhythm.  VASCULAR: There is no thrill or bruit in her left thigh AV graft. There is currently no active bleeding. PULMONARY: There is good air exchange bilaterally without wheezing or rales. ABDOMEN: Soft and non-tender with normal pitched bowel sounds.  MUSCULOSKELETAL: There are no major deformities or cyanosis. NEUROLOGIC: No focal weakness or paresthesias are detected. SKIN: There is a 5 mm wound overlying the medial aspect of the graft where she bled.  There is no active bleeding. PSYCHIATRIC: The patient has a normal affect.  DATA:    COVID-19 test pending  LABS: Her labs are pending.

## 2020-05-12 ENCOUNTER — Encounter (HOSPITAL_COMMUNITY): Payer: Self-pay | Admitting: Vascular Surgery

## 2020-05-12 LAB — CBC
HCT: 26.9 % — ABNORMAL LOW (ref 36.0–46.0)
Hemoglobin: 8.7 g/dL — ABNORMAL LOW (ref 12.0–15.0)
MCH: 30 pg (ref 26.0–34.0)
MCHC: 32.3 g/dL (ref 30.0–36.0)
MCV: 92.8 fL (ref 80.0–100.0)
Platelets: 77 10*3/uL — ABNORMAL LOW (ref 150–400)
RBC: 2.9 MIL/uL — ABNORMAL LOW (ref 3.87–5.11)
RDW: 15.5 % (ref 11.5–15.5)
WBC: 7.3 10*3/uL (ref 4.0–10.5)
nRBC: 0 % (ref 0.0–0.2)

## 2020-05-12 LAB — TYPE AND SCREEN
ABO/RH(D): A POS
Antibody Screen: NEGATIVE
Unit division: 0
Unit division: 0

## 2020-05-12 LAB — BPAM RBC
Blood Product Expiration Date: 202109252359
Blood Product Expiration Date: 202109252359
ISSUE DATE / TIME: 202108210157
ISSUE DATE / TIME: 202108210224
Unit Type and Rh: 5100
Unit Type and Rh: 5100

## 2020-05-12 MED ORDER — CINACALCET HCL 30 MG PO TABS
180.0000 mg | ORAL_TABLET | Freq: Every day | ORAL | Status: DC
  Administered 2020-05-12 – 2020-05-13 (×2): 180 mg via ORAL
  Filled 2020-05-12 (×3): qty 6

## 2020-05-12 MED ORDER — HEPARIN SODIUM (PORCINE) 1000 UNIT/ML IJ SOLN
INTRAMUSCULAR | Status: AC
  Filled 2020-05-12: qty 6

## 2020-05-12 MED ORDER — ACETAMINOPHEN 325 MG PO TABS
650.0000 mg | ORAL_TABLET | Freq: Four times a day (QID) | ORAL | Status: DC | PRN
  Administered 2020-05-12: 650 mg via ORAL
  Filled 2020-05-12: qty 2

## 2020-05-12 MED ORDER — SEVELAMER CARBONATE 800 MG PO TABS
4000.0000 mg | ORAL_TABLET | Freq: Three times a day (TID) | ORAL | Status: DC
  Administered 2020-05-12 – 2020-05-14 (×4): 4000 mg via ORAL
  Filled 2020-05-12 (×4): qty 5

## 2020-05-12 MED ORDER — SEVELAMER CARBONATE 800 MG PO TABS: 800.0000 mg | ORAL_TABLET | ORAL | Status: DC | PRN

## 2020-05-12 NOTE — Progress Notes (Signed)
   05/12/20 0124  Hand-Off documentation  Handoff Given Given to shift RN/LPN  Report given to (Full Name) Venetia Maxon, RN  Handoff Received Received from shift RN/LPN  Report received from (Full Name) Josephine Rudnick, Keon Pender  Vital Signs  Temp 98.6 F (37 C)  Temp Source Oral  Pulse Rate (!) 101  Pulse Rate Source Monitor  Resp (!) 30  BP (!) 104/49  BP Location Left Arm  BP Method Automatic  Patient Position (if appropriate) Lying  Oxygen Therapy  SpO2 97 %  O2 Device Room Air  Pain Assessment  Pain Scale 0-10  Pain Score 0  Post-Hemodialysis Assessment  Rinseback Volume (mL) 250 mL  KECN 250 V  Dialyzer Clearance Lightly streaked  Duration of HD Treatment -hour(s) 3.28 hour(s)  Hemodialysis Intake (mL) 500 mL  UF Total -Machine (mL) 2000 mL  Net UF (mL) 1500 mL  Tolerated HD Treatment Yes  Post-Hemodialysis Comments tx achieved as expected,  well tolerated  AVG/AVF Arterial Site Held (minutes) 0 minutes  AVG/AVF Venous Site Held (minutes) 0 minutes  Education / Care Plan  Dialysis Education Provided Yes  Hemodialysis Catheter Left Femoral vein Double lumen Permanent (Tunneled)  Placement Date/Time: 05/11/20 1310   Placed prior to admission: No  Time Out: Correct patient;Correct site;Correct procedure  Maximum sterile barrier precautions: Mask;Cap;Hand hygiene;Sterile gloves;Large sterile sheet;Sterile gown  Site Prep: Betadi...  Site Condition No complications  Blue Lumen Status Heparin locked;Capped (Central line)  Red Lumen Status Heparin locked;Capped (Central line)  Catheter fill solution Heparin 1000 units/ml  Catheter fill volume (Arterial) 3.1 cc  Catheter fill volume (Venous) 3.1  Dressing Type Gauze/Drain sponge  Dressing Status Clean;Dry;Intact  Drainage Description None

## 2020-05-12 NOTE — Progress Notes (Signed)
   05/12/20 0151  Vitals  Temp 98 F (36.7 C)  Temp Source Oral  BP (!) 104/46  MAP (mmHg) (!) 63  BP Location Left Arm  BP Method Automatic  Patient Position (if appropriate) Lying  Pulse Rate 99  Pulse Rate Source Monitor  ECG Heart Rate 98  Resp 20  MEWS COLOR  MEWS Score Color Green  Oxygen Therapy  SpO2 98 %  O2 Device Room Air   Received pt back from HD. VS above. No complaints.  L femoral HD cath dressing CDI. Will continue to monitor.

## 2020-05-12 NOTE — Progress Notes (Signed)
  [facility] Progress Note    Assessment/ Plan:   1.  AVG dysfunction/hemorrhage: S/p ulceration repair + left thigh TDC placement 8/22 by [facility]. No left pedal pulse, ABIs pending per [facility]VS 2.  ESRD: Continue HD per TTS schedule - no heparin for now 3.  Hypertension/volume: BP stable, no volume excess on exam. 4.  Anemia of ESRD: Hgb typically > 11. Lower today - follow closely transfuse for hgb <7 5.  Metabolic bone disease: CorrCa ok, Phos pending. 6.  Nutrition:  Alb low, start supplements  Dialysis Orders:  TTS @ [facility] 3:15hr, 450/A1.5, EDW 83.5kg, 2K/2Ca, AVG, heparin 5000 bolus - Hectoral 85mcg IV q HD - No ESA, Hgb 12.2 on 8/19  Gean Quint, MD [facility] 05/12/2020, 10:00 AM    Subjective:   No complaints, feels well post op. Did well with HD yesterday. Net uf 1.5L   Objective:   BP (!) 109/46 (BP Location: Left Arm)   Pulse 96   Temp 98.2 F (36.8 C) (Oral)   Resp 18   Ht 5\' 5"  (1.651 m)   Wt 83.9 kg   LMP 02/16/2012   SpO2 98%   BMI 30.79 kg/m   Intake/Output Summary (Last 24 hours) at 05/12/2020 1000 Last data filed at 05/12/2020 6045 Gross per 24 hour  Intake 870 ml  Output 1525 ml  Net -655 ml   Weight change:   Physical Exam: Gen:nad, comfortable appearing, sitting up in chair CVS:reg rate Resp:cta bl, no w/r/r/c, unlabored, bl chest expansion WUJ:WJXBJ, soft, nt/nd YNW:GNFAOZHY left thigh, dressings in place, left thigh tdc in place, unable to palpate left pedal pulse Neuro: speech clear and coherent, moves all extremities spontaneously  Imaging: DG Chest Port 1 View  Result Date: 05/11/2020 CLINICAL DATA:  End-stage renal disease EXAM: PORTABLE CHEST 1 VIEW COMPARISON:  06/23/2012 chest radiograph. FINDINGS: Inferior approach central venous catheter terminates over the superior cavoatrial junction. Mild cardiomegaly. Otherwise normal mediastinal contour. No pneumothorax. No pleural effusion. No overt pulmonary edema.  Mild platelike scarring versus atelectasis in the lung bases bilaterally. No acute consolidative airspace disease. IMPRESSION: 1. Mild cardiomegaly without overt pulmonary edema. 2. Mild platelike scarring versus atelectasis in the lung bases. Electronically Signed   By: Ilona Sorrel M.D.   On: 05/11/2020 14:45   DG Fluoro Guide CV Line-No Report  Result Date: 05/11/2020 Fluoroscopy was utilized by the requesting physician.  No radiographic interpretation.    Labs: BMET Recent Labs  Lab 05/11/20 0110 05/11/20 0239 05/11/20 0616 05/11/20 2312  NA 141 140 140 138  K 4.3 4.2 5.9* 5.8*  CL 99 100 100 100  CO2 27  --  29 24  GLUCOSE 144* 128* 103* 180*  BUN 28* 35* 27* 32*  CREATININE 8.36* 7.80* 7.94* 8.55*  CALCIUM 7.7*  --  7.4* 7.6*  PHOS  --   --   --  4.8*   CBC Recent Labs  Lab 05/11/20 0110 05/11/20 0239 05/11/20 0616 05/12/20 0300  WBC 8.1  --  8.7 7.3  NEUTROABS 2.6  --   --   --   HGB 9.0* 10.2* 10.6* 8.7*  HCT 31.3* 30.0* 34.1* 26.9*  MCV 101.3*  --  96.1 92.8  PLT 118*  --  82* 77*    Medications:    . (feeding supplement) PROSource Plus  30 mL Oral BID BM  . Chlorhexidine Gluconate Cloth  6 each Topical [id number]

## 2020-05-12 NOTE — Progress Notes (Signed)
   VASCULAR SURGERY ASSESSMENT & PLAN:   POD 1 STATUS POST INSERTION OF LEFT THIGH TUNNELED DIALYSIS CATHETER: She has a central venous occlusion.  Her left IJ is occluded in her right IJ empties into her azygos vein.  Fortunately I was able to place the tunneled dialysis catheter on the left preserving the right thigh for placement of an AV graft at some point.  However I cannot palpate pedal pulses on the left and I have ordered ABIs to help determine if she is a candidate for a right thigh AV graft.  Certainly given her obesity (BMI 31) she is at high risk for groin healing problems and graft infection.  ACUTE BLOOD LOSS ANEMIA: I think she has received 2 units of blood since her event.  Her hemoglobin is now 8.7.  END-STAGE RENAL DISEASE: The medical service is consulted nephrology for maintaining her dialysis.  ABIs have been ordered in order to determine if she is a candidate for a right thigh graft.  SUBJECTIVE:   No specific complaints this morning.  PHYSICAL EXAM:   Vitals:   05/12/20 0124 05/12/20 0151 05/12/20 0310 05/12/20 0803  BP: (!) 104/49 (!) 104/46 (!) 107/52 (!) 109/46  Pulse: (!) 101 99 94 96  Resp: (!) 30 20 16 18   Temp: 98.6 F (37 C) 98 F (36.7 C) 98.5 F (36.9 C) 98.2 F (36.8 C)  TempSrc: Oral Oral Oral Oral  SpO2: 97% 98% 96% 98%  Weight:      Height:       Body mass index is 30.79 kg/m.  Her dressings in the left thigh are dry.  LABS:   Lab Results  Component Value Date   WBC 7.3 05/12/2020   HGB 8.7 (L) 05/12/2020   HCT 26.9 (L) 05/12/2020   MCV 92.8 05/12/2020   PLT 77 (L) 05/12/2020    Lab Results  Component Value Date   INR 1.1 05/11/2020   PROBLEM LIST:    Principal Problem:   Acute blood loss anemia Active Problems:   End stage renal disease (HCC)   CURRENT MEDS:   . (feeding supplement) PROSource Plus  30 mL Oral BID BM  . Chlorhexidine Gluconate Cloth  6 each Topical Q0600    [name] Office:  [phone] 05/12/2020

## 2020-05-12 NOTE — Progress Notes (Signed)
PROGRESS NOTE    Sue Neal  SJG:[id number] DOB: 1945-12-09 DOA: 05/11/2020 PCP: [facility], [facility]    Brief Narrative:  Sue Neal is a 74 y.o. female with history of ESRD on hemodialysis T/T/S was brought to the ER the patient was noticed to have bleeding from the AV graft after patient picked on the graft.  Per the report patient almost lost a liter of blood was given fluid bolus and brought to the ER.  Denies chest pain or shortness of breath.  In the ER patient was hypotensive and was started on 2 units of PRBC transfusion hemoglobin dropped from 14 g in January 2021 and is presently around 9.  Vascular surgeon Dr. Scot Dock was consulted for evaluation of AVG site compromise. Patient's bleeding stopped after placing compressive pads.   Assessment & Plan:   Principal Problem:   Acute blood loss anemia Active Problems:   End stage renal disease (HCC)   Acute blood loss anemia secondary to AV graft bleeding/compromise Patient presenting to the ED following active bleeding from left femoral AV graft site. Was evaluated by vascular surgery, Dr. Scot Dock and underwent repair of the ulceration of left AV graft sites and placement of a left femoral tunneled dialysis catheter on 05/11/2020. --s/p 2u pRBC 8/21 --Hgb 9.0>10.6>8.7 --Will need eventual placement of a new right thigh graft once she recovers from this acute event per vascular surgery; pending ABI --follow hemoglobin daily, transfuse for Hgb <7.0  ESRD on HD T/T/S Patient dialyzes at [facility]. --Nephrology consulted --Continue midodrine with HD   DVT prophylaxis: SCDs Code Status: Full code Family Communication: No family present at bedside this morning  Disposition Plan:  Status is: Inpatient  Remains inpatient appropriate because:Hemodynamically unstable, Ongoing diagnostic testing needed not appropriate for outpatient work up and Inpatient level of care appropriate due to severity of  illness   Dispo: The patient is from: Home              Anticipated d/c is to: Home              Anticipated d/c date is: 1 day              Patient currently is not medically stable to d/c.   Consultants:   Vascular surgery  Nephrology  Procedures:   Left AV thigh graft repair 8/21  Right femoral tunneled HD catheter placement 8/21  Antimicrobials:   Perioperative cefazolin   Subjective: Patient seen and examined at bedside, resting comfortably in bedside chair.  No complaints this morning.  Vascular surgery ordering ABIs due to no LLE pedal pulse. Denies headache, no fever/chills/night sweats, no nausea/vomiting/diarrhea, no chest pain, palpitations, no shortness of breath.  No acute events overnight per nursing staff.  Objective: Vitals:   05/12/20 0151 05/12/20 0310 05/12/20 0803 05/12/20 1106  BP: (!) 104/46 (!) 107/52 (!) 109/46 (!) 107/47  Pulse: 99 94 96 84  Resp: 20 16 18 18   Temp: 98 F (36.7 C) 98.5 F (36.9 C) 98.2 F (36.8 C) 98.3 F (36.8 C)  TempSrc: Oral Oral Oral Oral  SpO2: 98% 96% 98% 91%  Weight:      Height:        Intake/Output Summary (Last 24 hours) at 05/12/2020 1154 Last data filed at 05/12/2020 0723 Gross per 24 hour  Intake 870 ml  Output 1525 ml  Net -655 ml   Filed Weights   05/11/20 0111  Weight: 83.9 kg    Examination:  General exam: Appears calm and comfortable  Respiratory system: Clear to auscultation. Respiratory effort normal.  Oxygenating well on room air Cardiovascular system: S1 & S2 heard, RRR. No JVD, murmurs, rubs, gallops or clicks. No pedal edema. Gastrointestinal system: Abdomen is nondistended, soft and nontender. No organomegaly or masses felt. Normal bowel sounds heard. Central nervous system: Alert and oriented. No focal neurological deficits. Extremities: Symmetric 5 x 5 power.  Left thigh with dressing in place with old blood present Skin: No rashes, lesions or ulcers Psychiatry: Judgement and insight  appear normal. Mood & affect appropriate.     Data Reviewed: I have personally reviewed following labs and imaging studies  CBC: Recent Labs  Lab 05/11/20 0110 05/11/20 0239 05/11/20 0616 05/12/20 0300  WBC 8.1  --  8.7 7.3  NEUTROABS 2.6  --   --   --   HGB 9.0* 10.2* 10.6* 8.7*  HCT 31.3* 30.0* 34.1* 26.9*  MCV 101.3*  --  96.1 92.8  PLT 118*  --  82* 77*   Basic Metabolic Panel: Recent Labs  Lab 05/11/20 0110 05/11/20 0239 05/11/20 0616 05/11/20 2312  NA 141 140 140 138  K 4.3 4.2 5.9* 5.8*  CL 99 100 100 100  CO2 27  --  29 24  GLUCOSE 144* 128* 103* 180*  BUN 28* 35* 27* 32*  CREATININE 8.36* 7.80* 7.94* 8.55*  CALCIUM 7.7*  --  7.4* 7.6*  PHOS  --   --   --  4.8*   GFR: Estimated Creatinine Clearance: 6.2 mL/min (A) (by C-G formula based on SCr of 8.55 mg/dL (H)). Liver Function Tests: Recent Labs  Lab 05/11/20 0110 05/11/20 2312  AST 15  --   ALT 10  --   ALKPHOS 60  --   BILITOT 0.6  --   PROT 5.2*  --   ALBUMIN 2.7* 2.9*   No results for input(s): LIPASE, AMYLASE in the last 168 hours. No results for input(s): AMMONIA in the last 168 hours. Coagulation Profile: Recent Labs  Lab 05/11/20 0110  INR 1.1   Cardiac Enzymes: No results for input(s): CKTOTAL, CKMB, CKMBINDEX, TROPONINI in the last 168 hours. BNP (last 3 results) No results for input(s): PROBNP in the last 8760 hours. HbA1C: No results for input(s): HGBA1C in the last 72 hours. CBG: No results for input(s): GLUCAP in the last 168 hours. Lipid Profile: No results for input(s): CHOL, HDL, LDLCALC, TRIG, CHOLHDL, LDLDIRECT in the last 72 hours. Thyroid Function Tests: No results for input(s): TSH, T4TOTAL, FREET4, T3FREE, THYROIDAB in the last 72 hours. Anemia Panel: No results for input(s): VITAMINB12, FOLATE, FERRITIN, TIBC, IRON, RETICCTPCT in the last 72 hours. Sepsis Labs: No results for input(s): PROCALCITON, LATICACIDVEN in the last 168 hours.  Recent Results (from the  past 240 hour(s))  SARS Coronavirus 2 by RT PCR (hospital order, performed in [facility] hospital lab) Nasopharyngeal Nasopharyngeal Swab     Status: None   Collection Time: 05/11/20  4:55 AM   Specimen: Nasopharyngeal Swab  Result Value Ref Range Status   SARS Coronavirus 2 NEGATIVE NEGATIVE Final    Comment: (NOTE) SARS-CoV-2 target nucleic acids are NOT DETECTED.  The SARS-CoV-2 RNA is generally detectable in upper and lower respiratory specimens during the acute phase of infection. The lowest concentration of SARS-CoV-2 viral copies this assay can detect is 250 copies / mL. A negative result does not preclude SARS-CoV-2 infection and should not be used as the sole basis for treatment or other  patient management decisions.  A negative result may occur with improper specimen collection / handling, submission of specimen other than nasopharyngeal swab, presence of viral mutation(s) within the areas targeted by this assay, and inadequate number of viral copies (<250 copies / mL). A negative result must be combined with clinical observations, patient history, and epidemiological information.  Fact Sheet for Patients:   StrictlyIdeas.no  Fact Sheet for Healthcare Providers: BankingDealers.co.za  This test is not yet approved or  cleared by the [place] FDA and has been authorized for detection and/or diagnosis of SARS-CoV-2 by FDA under an Emergency Use Authorization (EUA).  This EUA will remain in effect (meaning this test can be used) for the duration of the COVID-19 declaration under Section 564(b)(1) of the Act, 21 U.S.C. section 360bbb-3(b)(1), unless the authorization is terminated or revoked sooner.  Performed at [facility], [place] [address], [place], [place] [address]          Radiology Studies: DG Chest Port 1 View  Result Date: 05/11/2020 CLINICAL DATA:  End-stage renal disease EXAM: PORTABLE CHEST 1 VIEW  COMPARISON:  06/23/2012 chest radiograph. FINDINGS: Inferior approach central venous catheter terminates over the superior cavoatrial junction. Mild cardiomegaly. Otherwise normal mediastinal contour. No pneumothorax. No pleural effusion. No overt pulmonary edema. Mild platelike scarring versus atelectasis in the lung bases bilaterally. No acute consolidative airspace disease. IMPRESSION: 1. Mild cardiomegaly without overt pulmonary edema. 2. Mild platelike scarring versus atelectasis in the lung bases. Electronically Signed   By: Ilona Sorrel M.D.   On: 05/11/2020 14:45   DG Fluoro Guide CV Line-No Report  Result Date: 05/11/2020 Fluoroscopy was utilized by the requesting physician.  No radiographic interpretation.        Scheduled Meds: . (feeding supplement) PROSource Plus  30 mL Oral BID BM  . Chlorhexidine Gluconate Cloth  6 each Topical Q0600  . cinacalcet  180 mg Oral QPC supper  . sevelamer carbonate  4,000 mg Oral TID WC   Continuous Infusions: . sodium chloride    . sodium chloride       LOS: 1 day    Time spent: 34 minutes spent on chart review, discussion with nursing staff, consultants, updating family and interview/physical exam; more than 50% of that time was spent in counseling and/or coordination of care.    Sue Shane J [place] DO [facility] Available via Epic secure chat 7am-7pm After these hours, please refer to coverage provider listed on [email or web address] 05/12/2020, 11:54 AM

## 2020-05-13 ENCOUNTER — Inpatient Hospital Stay (HOSPITAL_COMMUNITY): Payer: Medicare Other

## 2020-05-13 DIAGNOSIS — I739 Peripheral vascular disease, unspecified: Secondary | ICD-10-CM

## 2020-05-13 LAB — CBC
HCT: 25.1 % — ABNORMAL LOW (ref 36.0–46.0)
Hemoglobin: 7.9 g/dL — ABNORMAL LOW (ref 12.0–15.0)
MCH: 29.8 pg (ref 26.0–34.0)
MCHC: 31.5 g/dL (ref 30.0–36.0)
MCV: 94.7 fL (ref 80.0–100.0)
Platelets: 84 10*3/uL — ABNORMAL LOW (ref 150–400)
RBC: 2.65 MIL/uL — ABNORMAL LOW (ref 3.87–5.11)
RDW: 15.8 % — ABNORMAL HIGH (ref 11.5–15.5)
WBC: 6.2 10*3/uL (ref 4.0–10.5)
nRBC: 0 % (ref 0.0–0.2)

## 2020-05-13 MED ORDER — DIPHENHYDRAMINE HCL 25 MG PO CAPS
25.0000 mg | ORAL_CAPSULE | Freq: Once | ORAL | Status: AC
  Administered 2020-05-13: 25 mg via ORAL
  Filled 2020-05-13: qty 1

## 2020-05-13 NOTE — Progress Notes (Signed)
   VASCULAR SURGERY ASSESSMENT & PLAN:   END-STAGE RENAL DISEASE: Her left femoral Diatek catheter is working well.  It looks like her only remaining option for access would be a right thigh AV graft.  I cannot palpate pedal pulses and ABIs have been ordered.  If she has good perfusion on the right she could be considered for a right thigh AV graft although she will be at high risk for infection and wound healing problems given her morbid obesity.  ANEMIA: Her hemoglobin has drifted down to 7.9.  She may need further transfusion at the time of her dialysis.  I will defer this to renal.  SUBJECTIVE:   No complaints this morning.  PHYSICAL EXAM:   Vitals:   05/12/20 1758 05/12/20 2122 05/13/20 0058 05/13/20 0528  BP: (!) 105/53 (!) 104/49 (!) 91/46 (!) 104/49  Pulse: 82 85 84 74  Resp: 20 (!) 21 14 18   Temp: 98.4 F (36.9 C) 98.5 F (36.9 C) 98.7 F (37.1 C) 98.5 F (36.9 C)  TempSrc: Oral Oral Oral Oral  SpO2: 97% 98% 97% 98%  Weight:    85.1 kg  Height:       Her Diatek site looks fine. Wound on her medial thigh graft has no significant drainage.  LABS:   Lab Results  Component Value Date   WBC 6.2 05/13/2020   HGB 7.9 (L) 05/13/2020   HCT 25.1 (L) 05/13/2020   MCV 94.7 05/13/2020   PLT 84 (L) 05/13/2020   Lab Results  Component Value Date   CREATININE 8.55 (H) 05/11/2020   Lab Results  Component Value Date   INR 1.1 05/11/2020   CBG (last 3)  No results for input(s): GLUCAP in the last 72 hours.  PROBLEM LIST:    Principal Problem:   Acute blood loss anemia Active Problems:   End stage renal disease (HCC)   CURRENT MEDS:   . (feeding supplement) PROSource Plus  30 mL Oral BID BM  . Chlorhexidine Gluconate Cloth  6 each Topical Q0600  . cinacalcet  180 mg Oral QPC supper  . sevelamer carbonate  4,000 mg Oral TID WC    [name] Office: [phone] 05/13/2020

## 2020-05-13 NOTE — H&P (View-Only) (Signed)
° °  VASCULAR SURGERY ASSESSMENT & PLAN:   END-STAGE RENAL DISEASE: Her left femoral Diatek catheter is working well.  It looks like her only remaining option for access would be a right thigh AV graft.  I cannot palpate pedal pulses and ABIs have been ordered.  If she has good perfusion on the right she could be considered for a right thigh AV graft although she will be at high risk for infection and wound healing problems given her morbid obesity.  ANEMIA: Her hemoglobin has drifted down to 7.9.  She may need further transfusion at the time of her dialysis.  I will defer this to renal.  SUBJECTIVE:   No complaints this morning.  PHYSICAL EXAM:   Vitals:   05/12/20 1758 05/12/20 2122 05/13/20 0058 05/13/20 0528  BP: (!) 105/53 (!) 104/49 (!) 91/46 (!) 104/49  Pulse: 82 85 84 74  Resp: 20 (!) 21 14 18   Temp: 98.4 F (36.9 C) 98.5 F (36.9 C) 98.7 F (37.1 C) 98.5 F (36.9 C)  TempSrc: Oral Oral Oral Oral  SpO2: 97% 98% 97% 98%  Weight:    85.1 kg  Height:       Her Diatek site looks fine. Wound on her medial thigh graft has no significant drainage.  LABS:   Lab Results  Component Value Date   WBC 6.2 05/13/2020   HGB 7.9 (L) 05/13/2020   HCT 25.1 (L) 05/13/2020   MCV 94.7 05/13/2020   PLT 84 (L) 05/13/2020   Lab Results  Component Value Date   CREATININE 8.55 (H) 05/11/2020   Lab Results  Component Value Date   INR 1.1 05/11/2020   CBG (last 3)  No results for input(s): GLUCAP in the last 72 hours.  PROBLEM LIST:    Principal Problem:   Acute blood loss anemia Active Problems:   End stage renal disease (HCC)   CURRENT MEDS:    (feeding supplement) PROSource Plus  30 mL Oral BID BM   Chlorhexidine Gluconate Cloth  6 each Topical Q0600   cinacalcet  180 mg Oral QPC supper   sevelamer carbonate  4,000 mg Oral TID WC    [name] Office: [phone] 05/13/2020

## 2020-05-13 NOTE — Progress Notes (Signed)
PROGRESS NOTE    Dedee Liss  GHW:[id number] DOB: [birth year] DOA: 05/11/2020 PCP: [facility]    Brief Narrative:  Sue Neal is a 74 y.o. female with history of ESRD on hemodialysis T/T/S was brought to the ER the patient was noticed to have bleeding from the AV graft after patient picked on the graft.  Per the report patient almost lost a liter of blood was given fluid bolus and brought to the ER.  Denies chest pain or shortness of breath.  In the ER patient was hypotensive and was started on 2 units of PRBC transfusion hemoglobin dropped from 14 g in January 2021 and is presently around 9.  Vascular surgeon Dr. Scot Dock was consulted for evaluation of AVG site compromise. Patient's bleeding stopped after placing compressive pads.   Assessment & Plan:   Principal Problem:   Acute blood loss anemia Active Problems:   End stage renal disease (HCC)   Acute blood loss anemia secondary to AV graft bleeding/compromise Patient presenting to the ED following active bleeding from left femoral AV graft site. Was evaluated by vascular surgery, Dr. Scot Dock and underwent repair of the ulceration of left AV graft sites and placement of a left femoral tunneled dialysis catheter on 05/11/2020. --s/p 2u pRBC 8/21 --Hgb 9.0>10.6>8.7>7.9 --Will need eventual placement of a new right thigh graft once she recovers from this acute event per vascular surgery; pending ABI --follow hemoglobin daily, transfuse for Hgb <7.0  ESRD on HD T/T/S Patient dialyzes at [facility]. --Nephrology following --Continue midodrine with HD   DVT prophylaxis: SCDs Code Status: Full code Family Communication: No family present at bedside this morning  Disposition Plan:  Status is: Inpatient  Remains inpatient appropriate because:Ongoing diagnostic testing needed not appropriate for outpatient work up and Inpatient level of care appropriate due to severity of illness   Dispo: The  patient is from: Home              Anticipated d/c is to: Home              Anticipated d/c date is: 1 day              Patient currently is not medically stable to d/c.  Awaiting further recommendations per vascular surgery and nephrology.   Consultants:   Vascular surgery  Nephrology  Procedures:   Left AV thigh graft repair 8/21  Right femoral tunneled HD catheter placement 8/21  Antimicrobials:   Perioperative cefazolin   Subjective: Patient seen and examined at bedside, resting comfortably in bedside chair. No complaints this morning.  Denies headache, no fever/chills/night sweats, no nausea/vomiting/diarrhea, no chest pain, palpitations, no shortness of breath.  No acute events overnight per nursing staff.  Objective: Vitals:   05/12/20 2122 05/13/20 0058 05/13/20 0528 05/13/20 0752  BP: (!) 104/49 (!) 91/46 (!) 104/49 (!) 91/45  Pulse: 85 84 74 75  Resp: (!) 21 14 18 18   Temp: 98.5 F (36.9 C) 98.7 F (37.1 C) 98.5 F (36.9 C) 98.4 F (36.9 C)  TempSrc: Oral Oral Oral Oral  SpO2: 98% 97% 98%   Weight:   85.1 kg   Height:        Intake/Output Summary (Last 24 hours) at 05/13/2020 1151 Last data filed at 05/12/2020 1700 Gross per 24 hour  Intake 240 ml  Output --  Net 240 ml   Filed Weights   05/11/20 0111 05/13/20 0528  Weight: 83.9 kg 85.1 kg    Examination:  General exam: Appears calm and comfortable  Respiratory system: Clear to auscultation. Respiratory effort normal.  Oxygenating well on room air Cardiovascular system: S1 & S2 heard, RRR. No JVD, murmurs, rubs, gallops or clicks. No pedal edema. Gastrointestinal system: Abdomen is nondistended, soft and nontender. No organomegaly or masses felt. Normal bowel sounds heard. Central nervous system: Alert and oriented. No focal neurological deficits. Extremities: Symmetric 5 x 5 power.  Left thigh with dressing in place with old blood present Skin: No rashes, lesions or ulcers Psychiatry: Judgement  and insight appear normal. Mood & affect appropriate.     Data Reviewed: I have personally reviewed following labs and imaging studies  CBC: Recent Labs  Lab 05/11/20 0110 05/11/20 0239 05/11/20 0616 05/12/20 0300 05/13/20 0206  WBC 8.1  --  8.7 7.3 6.2  NEUTROABS 2.6  --   --   --   --   HGB 9.0* 10.2* 10.6* 8.7* 7.9*  HCT 31.3* 30.0* 34.1* 26.9* 25.1*  MCV 101.3*  --  96.1 92.8 94.7  PLT 118*  --  82* 77* 84*   Basic Metabolic Panel: Recent Labs  Lab 05/11/20 0110 05/11/20 0239 05/11/20 0616 05/11/20 2312  NA 141 140 140 138  K 4.3 4.2 5.9* 5.8*  CL 99 100 100 100  CO2 27  --  29 24  GLUCOSE 144* 128* 103* 180*  BUN 28* 35* 27* 32*  CREATININE 8.36* 7.80* 7.94* 8.55*  CALCIUM 7.7*  --  7.4* 7.6*  PHOS  --   --   --  4.8*   GFR: Estimated Creatinine Clearance: 6.2 mL/min (A) (by C-G formula based on SCr of 8.55 mg/dL (H)). Liver Function Tests: Recent Labs  Lab 05/11/20 0110 05/11/20 2312  AST 15  --   ALT 10  --   ALKPHOS 60  --   BILITOT 0.6  --   PROT 5.2*  --   ALBUMIN 2.7* 2.9*   No results for input(s): LIPASE, AMYLASE in the last 168 hours. No results for input(s): AMMONIA in the last 168 hours. Coagulation Profile: Recent Labs  Lab 05/11/20 0110  INR 1.1   Cardiac Enzymes: No results for input(s): CKTOTAL, CKMB, CKMBINDEX, TROPONINI in the last 168 hours. BNP (last 3 results) No results for input(s): PROBNP in the last 8760 hours. HbA1C: No results for input(s): HGBA1C in the last 72 hours. CBG: No results for input(s): GLUCAP in the last 168 hours. Lipid Profile: No results for input(s): CHOL, HDL, LDLCALC, TRIG, CHOLHDL, LDLDIRECT in the last 72 hours. Thyroid Function Tests: No results for input(s): TSH, T4TOTAL, FREET4, T3FREE, THYROIDAB in the last 72 hours. Anemia Panel: No results for input(s): VITAMINB12, FOLATE, FERRITIN, TIBC, IRON, RETICCTPCT in the last 72 hours. Sepsis Labs: No results for input(s): PROCALCITON,  LATICACIDVEN in the last 168 hours.  Recent Results (from the past 240 hour(s))  SARS Coronavirus 2 by RT PCR (hospital order, performed in [facility] hospital lab) Nasopharyngeal Nasopharyngeal Swab     Status: None   Collection Time: 05/11/20  4:55 AM   Specimen: Nasopharyngeal Swab  Result Value Ref Range Status   SARS Coronavirus 2 NEGATIVE NEGATIVE Final    Comment: (NOTE) SARS-CoV-2 target nucleic acids are NOT DETECTED.  The SARS-CoV-2 RNA is generally detectable in upper and lower respiratory specimens during the acute phase of infection. The lowest concentration of SARS-CoV-2 viral copies this assay can detect is 250 copies / mL. A negative result does not preclude SARS-CoV-2 infection and should not  be used as the sole basis for treatment or other patient management decisions.  A negative result may occur with improper specimen collection / handling, submission of specimen other than nasopharyngeal swab, presence of viral mutation(s) within the areas targeted by this assay, and inadequate number of viral copies (<250 copies / mL). A negative result must be combined with clinical observations, patient history, and epidemiological information.  Fact Sheet for Patients:   StrictlyIdeas.no  Fact Sheet for Healthcare Providers: BankingDealers.co.za  This test is not yet approved or  cleared by the [place] FDA and has been authorized for detection and/or diagnosis of SARS-CoV-2 by FDA under an Emergency Use Authorization (EUA).  This EUA will remain in effect (meaning this test can be used) for the duration of the COVID-19 declaration under Section 564(b)(1) of the Act, 21 U.S.C. section 360bbb-3(b)(1), unless the authorization is terminated or revoked sooner.  Performed at [facility], [place] [address], [place], [place] [address]          Radiology Studies: DG Chest Port 1 View  Result Date:  05/11/2020 CLINICAL DATA:  End-stage renal disease EXAM: PORTABLE CHEST 1 VIEW COMPARISON:  06/23/2012 chest radiograph. FINDINGS: Inferior approach central venous catheter terminates over the superior cavoatrial junction. Mild cardiomegaly. Otherwise normal mediastinal contour. No pneumothorax. No pleural effusion. No overt pulmonary edema. Mild platelike scarring versus atelectasis in the lung bases bilaterally. No acute consolidative airspace disease. IMPRESSION: 1. Mild cardiomegaly without overt pulmonary edema. 2. Mild platelike scarring versus atelectasis in the lung bases. Electronically Signed   By: Ilona Sorrel M.D.   On: 05/11/2020 14:45   DG Fluoro Guide CV Line-No Report  Result Date: 05/11/2020 Fluoroscopy was utilized by the requesting physician.  No radiographic interpretation.   VAS [place] ABI WITH/WO TBI  Result Date: 05/13/2020 LOWER EXTREMITY DOPPLER STUDY Indications: Peripheral artery disease, and Pre-op for right thigh AVG.  Comparison Study: Prior study 05-04-2018 Performing Technologist: Darlin Coco  Examination Guidelines: A complete evaluation includes at minimum, Doppler waveform signals and systolic blood pressure reading at the level of bilateral brachial, anterior tibial, and posterior tibial arteries, when vessel segments are accessible. Bilateral testing is considered an integral part of a complete examination. Photoelectric Plethysmograph (PPG) waveforms and toe systolic pressure readings are included as required and additional duplex testing as needed. Limited examinations for reoccurring indications may be performed as noted.  ABI Findings: +---------+------------------+-----+---------+---------------------------------+ Right    Rt Pressure (mmHg)IndexWaveform Comment                           +---------+------------------+-----+---------+---------------------------------+ Brachial                        triphasicUnable to obtain pressure due to                                            pain in limb                      +---------+------------------+-----+---------+---------------------------------+ PTA      255               2.36 triphasic                                  +---------+------------------+-----+---------+---------------------------------+  DP       140               1.30 triphasic                                  +---------+------------------+-----+---------+---------------------------------+ Great Toe69                0.64 Abnormal                                   +---------+------------------+-----+---------+---------------------------------+ +---------+------------------+-----+---------+-------+ Left     Lt Pressure (mmHg)IndexWaveform Comment +---------+------------------+-----+---------+-------+ Brachial 108                    triphasic        +---------+------------------+-----+---------+-------+ PTA      130               1.20 triphasic        +---------+------------------+-----+---------+-------+ DP       138               1.28 triphasic        +---------+------------------+-----+---------+-------+ Great Toe60                0.56 Abnormal         +---------+------------------+-----+---------+-------+ +-------+-----------+-----------+------------+------------+ ABI/TBIToday's ABIToday's TBIPrevious ABIPrevious TBI +-------+-----------+-----------+------------+------------+ Right  1.3        0.64       1.47        0.69         +-------+-----------+-----------+------------+------------+ Left   1.28       0.56       1.14        0.55         +-------+-----------+-----------+------------+------------+ Arterial wall calcification precludes accurate ankle pressures and ABIs.  Summary: Right: Resting right ankle-brachial index indicates noncompressible right lower extremity arteries. The right toe-brachial index is abnormal. ABIs are unreliable. Left: Resting left ankle-brachial index is  within normal range. No evidence of significant left lower extremity arterial disease. The left toe-brachial index is abnormal.  *See table(s) above for measurements and observations.    Preliminary         Scheduled Meds: . (feeding supplement) PROSource Plus  30 mL Oral BID BM  . Chlorhexidine Gluconate Cloth  6 each Topical Q0600  . cinacalcet  180 mg Oral QPC supper  . sevelamer carbonate  4,000 mg Oral TID WC   Continuous Infusions: . sodium chloride    . sodium chloride       LOS: 2 days    Time spent: 34 minutes spent on chart review, discussion with nursing staff, consultants, updating family and interview/physical exam; more than 50% of that time was spent in counseling and/or coordination of care.    Gayla Benn J [place] DO [facility] Available via Epic secure chat 7am-7pm After these hours, please refer to coverage provider listed on [email or web address] 05/13/2020, 11:51 AM

## 2020-05-13 NOTE — Progress Notes (Signed)
ABI study completed.   Please see CV Proc for preliminary results.   Sue Neal

## 2020-05-13 NOTE — Progress Notes (Signed)
°  [facility] Progress Note    Assessment/ Plan:   1.  AVG dysfunction/hemorrhage: S/p ulceration repair + left thigh TDC placement 8/22 by VVS. No left pedal pulse, ABIs pending per [facility] 2.  ESRD: Continue HD per TTS schedule - no heparin for now 3.  Hypertension/volume: BP stable, no volume excess on exam, close to edw. 4.  Anemia of ESRD: Hgb typically 11 > 10.2 > 8.7 > 7.9 today. Cont to follow,  transfuse for hgb <7 5.  Metabolic bone disease: CorrCa ok, Phos pending. 6.  Nutrition:  Alb low, start supplements  Dialysis Orders:  TTS [facility]  3h 39min  450/1.5  83.5kg  2/2 bath  AVG   Hep 5000 - Hectoral 77mcg IV q HD - No ESA, Hgb 12.2 on 8/19  Kelly Splinter, MD 05/13/2020, 4:02 PM     Subjective:   Seen in room, up in chair.    Objective:   BP 101/67 (BP Location: Left Arm)    Pulse 73    Temp 97.7 F (36.5 C) (Oral)    Resp 15    Ht 5\' 5"  (1.651 m)    Wt 85.1 kg    LMP 02/16/2012    SpO2 98%    BMI 31.23 kg/m   Intake/Output Summary (Last 24 hours) at 05/13/2020 1601 Last data filed at 05/12/2020 1700 Gross per 24 hour  Intake 240 ml  Output --  Net 240 ml   Weight change:   Physical Exam: Gen:nad, comfortable appearing, sitting up in chair CVS:reg rate Resp:cta bl, no w/r/r/c, unlabored, bl chest expansion WNU:UVOZD, soft, nt/nd GUY:QIHKVQQV left thigh, dressings in place, left thigh tdc in place, mild pretib edema bilat Neuro: speech clear and coherent, moves all extremities spontaneously   Labs: BMET Recent Labs  Lab 05/11/20 0110 05/11/20 0239 05/11/20 0616 05/11/20 2312  NA 141 140 140 138  K 4.3 4.2 5.9* 5.8*  CL 99 100 100 100  CO2 27  --  29 24  GLUCOSE 144* 128* 103* 180*  BUN 28* 35* 27* 32*  CREATININE 8.36* 7.80* 7.94* 8.55*  CALCIUM 7.7*  --  7.4* 7.6*  PHOS  --   --   --  4.8*   CBC Recent Labs  Lab 05/11/20 0110 05/11/20 0110 05/11/20 0239 05/11/20 0616 05/12/20 0300 05/13/20 0206  WBC 8.1  --   --  8.7 7.3 6.2   NEUTROABS 2.6  --   --   --   --   --   HGB 9.0*   < > 10.2* 10.6* 8.7* 7.9*  HCT 31.3*   < > 30.0* 34.1* 26.9* 25.1*  MCV 101.3*  --   --  96.1 92.8 94.7  PLT 118*  --   --  82* 77* 84*   < > = values in this interval not displayed.    Medications:     (feeding supplement) PROSource Plus  30 mL Oral BID BM   Chlorhexidine Gluconate Cloth  6 each Topical Q0600   cinacalcet  180 mg Oral QPC supper   sevelamer carbonate  4,000 mg Oral TID WC

## 2020-05-14 LAB — BASIC METABOLIC PANEL
Anion gap: 11 (ref 5–15)
BUN: 33 mg/dL — ABNORMAL HIGH (ref 8–23)
CO2: 30 mmol/L (ref 22–32)
Calcium: 7.5 mg/dL — ABNORMAL LOW (ref 8.9–10.3)
Chloride: 96 mmol/L — ABNORMAL LOW (ref 98–111)
Creatinine, Ser: 9.09 mg/dL — ABNORMAL HIGH (ref 0.44–1.00)
GFR calc Af Amer: 4 mL/min — ABNORMAL LOW (ref >60)
GFR calc non Af Amer: 4 mL/min — ABNORMAL LOW (ref >60)
Glucose, Bld: 84 mg/dL (ref 70–99)
Potassium: 4.3 mmol/L (ref 3.5–5.1)
Sodium: 137 mmol/L (ref 135–145)

## 2020-05-14 LAB — CBC
HCT: 25 % — ABNORMAL LOW (ref 36.0–46.0)
Hemoglobin: 7.6 g/dL — ABNORMAL LOW (ref 12.0–15.0)
MCH: 28.8 pg (ref 26.0–34.0)
MCHC: 30.4 g/dL (ref 30.0–36.0)
MCV: 94.7 fL (ref 80.0–100.0)
Platelets: 78 10*3/uL — ABNORMAL LOW (ref 150–400)
RBC: 2.64 MIL/uL — ABNORMAL LOW (ref 3.87–5.11)
RDW: 15.4 % (ref 11.5–15.5)
WBC: 5.5 10*3/uL (ref 4.0–10.5)
nRBC: 0 % (ref 0.0–0.2)

## 2020-05-14 MED ORDER — HEPARIN SODIUM (PORCINE) 1000 UNIT/ML IJ SOLN
INTRAMUSCULAR | Status: AC
  Administered 2020-05-14: 6200 [IU]
  Filled 2020-05-14: qty 7

## 2020-05-14 NOTE — Progress Notes (Addendum)
Pt came back to rm 03 from dialysis. VSS. Reinitiated tele.   Lavenia Atlas, RN

## 2020-05-14 NOTE — Progress Notes (Signed)
D/c tele and IVs. Went over AVS with pt and all questions were answered. Pt went home with temporary HD cath. Education about preventing infection on HD cath given to pt.   Lavenia Atlas, RN

## 2020-05-14 NOTE — Progress Notes (Addendum)
    VASCULAR SURGERY ASSESSMENT & PLAN:   END-STAGE RENAL DISEASE: I believe the patient is being discharged today.  I will arrange for placement of a right thigh AV graft in the near future as an outpatient.  Sue Mayo, MD Office: [phone]    ABI Findings:  +---------+------------------+-----+---------+-----------------------------  ----+  Right  Rt Pressure (mmHg)IndexWaveform Comment                +---------+------------------+-----+---------+-----------------------------  ----+  Brachial             triphasicUnable to obtain pressure  due to                         pain in limb             +---------+------------------+-----+---------+-----------------------------  ----+  PTA   255        2.36 triphasic                   +---------+------------------+-----+---------+-----------------------------  ----+  DP    140        1.30 triphasic                   +---------+------------------+-----+---------+-----------------------------  ----+  Great Toe69        0.64 Abnormal                    +---------+------------------+-----+---------+-----------------------------  ----+   +---------+------------------+-----+---------+-------+  Left   Lt Pressure (mmHg)IndexWaveform Comment  +---------+------------------+-----+---------+-------+  Brachial 108           triphasic      +---------+------------------+-----+---------+-------+  PTA   130        1.20 triphasic      +---------+------------------+-----+---------+-------+  DP    138        1.28 triphasic      +---------+------------------+-----+---------+-------+  Great Toe60        0.56 Abnormal       +---------+------------------+-----+---------+-------+    +-------+-----------+-----------+------------+------------+  ABI/TBIToday's ABIToday's TBIPrevious ABIPrevious TBI  +-------+-----------+-----------+------------+------------+  Right 1.3    0.64    1.47    0.69      +-------+-----------+-----------+------------+------------+  Left  1.28    0.56    1.14    0.55      +-------+-----------+-----------+------------+------------+    Arterial wall calcification precludes accurate ankle pressures and ABIs.    Summary:  Right: Resting right ankle-brachial index indicates noncompressible right  lower extremity arteries. The right toe-brachial index is abnormal. ABIs  are unreliable.   Left: Resting left ankle-brachial index is within normal range. No  evidence of significant left lower extremity arterial disease. The left  toe-brachial index is abnormal.   A/P: ESRD with working left femoral TDC Calcified LE arteries with triphasic flow appears to have acceptable right LE arterial flow for right thigh graft.  I will ask Sue Neal to review the studies and plan the procedure.  We can plan right thigh graft for 8/25/21by Dr. Donnetta Neal if patient is not discharged today.  If she is discharged we can plan it as an out patient.    Roxy Horseman PA-C [phone] [facility]

## 2020-05-14 NOTE — Discharge Summary (Signed)
Physician Discharge Summary  Sue Neal TWS:[id number] DOB: [birth year] DOA: 05/11/2020  PCP: [facility]  Admit date: 05/11/2020 Discharge date: 05/14/2020  Admitted From: Home Disposition: Home  Recommendations for Outpatient Follow-up:  1. Follow up with PCP in 1-2 weeks 2. Follow-up with vascular surgery in 1 week to schedule right thigh graft planned outpatient 3. Please obtain CBC in one week  Home Health: No Equipment/Devices: None  Discharge Condition: Stable CODE STATUS: Full code Diet recommendation: Renal diet  History of present illness:  Sue Neal is a 74 y.o.femalewithhistory of ESRD on hemodialysis T/T/S was brought to the ER the patient was noticed to have bleeding from the AV graft after patient picked on the graft. Per the report patient almost lost a liter of blood was given fluid bolus and brought to the ER. Denies chest pain or shortness of breath.  In the ER patient was hypotensive and was started on 2 units of PRBC transfusion hemoglobin dropped from 14 g in January 2021 and is presently around 9. Vascular surgeon Dr. Scot Dock was consulted for evaluation of AVG site compromise. Patient's bleeding stopped after placing compressive pads.  Hospital course:  Acute blood loss anemia secondary to AV graft bleeding/compromise Patient presenting to the ED following active bleeding from left femoral AV graft site.  Patient was transfused 2 units of PRBC on 05/11/2020.  Was evaluated by vascular surgery, Dr. Scot Dock and underwent repair of the ulceration of left AV graft sites and placement of a left femoral tunneled dialysis catheter on 05/11/2020.  Patient requested to follow-up outpatient for planned right thigh graft placement.  Hemoglobin 7.6 at time of discharge, recommend repeat CBC at next dialysis visit and transfuse for hemoglobin less than 7.0.  ESRD on HD T/T/S Patient dialyzes at [facility].  Now has tunneled left  femoral dialysis catheter in place with planned right thigh graft placement in the near future.  Continue midodrine with HD  Discharge Diagnoses:  Active Problems:   End stage renal disease [facility])    Discharge Instructions  Discharge Instructions    Call MD for:  difficulty breathing, headache or visual disturbances   Complete by: As directed    Call MD for:  extreme fatigue   Complete by: As directed    Call MD for:  persistant dizziness or light-headedness   Complete by: As directed    Call MD for:  persistant nausea and vomiting   Complete by: As directed    Call MD for:  redness, tenderness, or signs of infection (pain, swelling, redness, odor or green/yellow discharge around incision site)   Complete by: As directed    Call MD for:  temperature >100.4   Complete by: As directed    Diet - low sodium heart healthy   Complete by: As directed    Increase activity slowly   Complete by: As directed    No wound care   Complete by: As directed      Allergies as of 05/14/2020   No Known Allergies     Medication List    TAKE these medications   acetaminophen 500 MG tablet Commonly known as: TYLENOL Take 500-1,000 mg by mouth 2 (two) times daily as needed for moderate pain (for back pain).   ciclopirox 8 % solution Commonly known as: PENLAC Apply 1 application topically at bedtime.   cinacalcet 90 MG tablet Commonly known as: SENSIPAR Take 180 mg by mouth daily after supper.   diphenhydramine-acetaminophen 25-500 MG Tabs  tablet Commonly known as: TYLENOL PM Take 1 tablet by mouth at bedtime as needed (Back pain).   feeding supplement (NEPRO CARB STEADY) Liqd Take 237 mLs by mouth every Tuesday, Thursday, and Saturday at 6 PM.   ketotifen 0.025 % ophthalmic solution Commonly known as: ZADITOR Place 1 drop into both eyes daily as needed (for dry eyes).   midodrine 10 MG tablet Commonly known as: PROAMATINE Take 10 mg by mouth Every Tuesday,Thursday,and Saturday  with dialysis.   NONFORMULARY OR COMPOUNDED ITEM [employer] compound: Nail Fungus - Complex Nail and Foot Disorders - Fluconazole 1%, DMSO 25%, Fluocinonide 0.05%, Ketoconazole 2%, apply 1-2 pumps to each affected nail beds and feet twice daily.   sevelamer carbonate 800 MG tablet Commonly known as: RENVELA Take 800-4,000 mg by mouth See admin instructions. Take 5 tablets with meals and take 2 tablets with snacks       Follow-up [place]. Schedule an appointment as soon as possible for a visit in 1 week(s).   Contact information: [place] [address] [phone]        Angelia Mould, MD. Schedule an appointment as soon as possible for a visit in 1 week(s).   Specialties: Vascular Surgery, Cardiology Contact information: [place] [address] [phone]              No Known Allergies  Consultations:  Nephrology  Vascular surgery, Dr. Scot Dock   Procedures/Studies: DG Chest Port 1 View  Result Date: 05/11/2020 CLINICAL DATA:  End-stage renal disease EXAM: PORTABLE CHEST 1 VIEW COMPARISON:  06/23/2012 chest radiograph. FINDINGS: Inferior approach central venous catheter terminates over the superior cavoatrial junction. Mild cardiomegaly. Otherwise normal mediastinal contour. No pneumothorax. No pleural effusion. No overt pulmonary edema. Mild platelike scarring versus atelectasis in the lung bases bilaterally. No acute consolidative airspace disease. IMPRESSION: 1. Mild cardiomegaly without overt pulmonary edema. 2. Mild platelike scarring versus atelectasis in the lung bases. Electronically Signed   By: Ilona Sorrel M.D.   On: 05/11/2020 14:45   DG Fluoro Guide CV Line-No Report  Result Date: 05/11/2020 Fluoroscopy was utilized by the requesting physician.  No radiographic interpretation.   VAS [place] ABI WITH/WO TBI  Result Date: 05/13/2020 LOWER EXTREMITY DOPPLER STUDY  Indications: Peripheral artery disease, and Pre-op for right thigh AVG.  Comparison Study: Prior study 05-04-2018 Performing Technologist: Darlin Coco  Examination Guidelines: A complete evaluation includes at minimum, Doppler waveform signals and systolic blood pressure reading at the level of bilateral brachial, anterior tibial, and posterior tibial arteries, when vessel segments are accessible. Bilateral testing is considered an integral part of a complete examination. Photoelectric Plethysmograph (PPG) waveforms and toe systolic pressure readings are included as required and additional duplex testing as needed. Limited examinations for reoccurring indications may be performed as noted.  ABI Findings: +---------+------------------+-----+---------+---------------------------------+ Right    Rt Pressure (mmHg)IndexWaveform Comment                           +---------+------------------+-----+---------+---------------------------------+ Brachial                        triphasicUnable to obtain pressure due to  pain in limb                      +---------+------------------+-----+---------+---------------------------------+ PTA      255               2.36 triphasic                                  +---------+------------------+-----+---------+---------------------------------+ DP       140               1.30 triphasic                                  +---------+------------------+-----+---------+---------------------------------+ Great Toe69                0.64 Abnormal                                   +---------+------------------+-----+---------+---------------------------------+ +---------+------------------+-----+---------+-------+ Left     Lt Pressure (mmHg)IndexWaveform Comment +---------+------------------+-----+---------+-------+ Brachial 108                    triphasic         +---------+------------------+-----+---------+-------+ PTA      130               1.20 triphasic        +---------+------------------+-----+---------+-------+ DP       138               1.28 triphasic        +---------+------------------+-----+---------+-------+ Great Toe60                0.56 Abnormal         +---------+------------------+-----+---------+-------+ +-------+-----------+-----------+------------+------------+ ABI/TBIToday's ABIToday's TBIPrevious ABIPrevious TBI +-------+-----------+-----------+------------+------------+ Right  1.3        0.64       1.47        0.69         +-------+-----------+-----------+------------+------------+ Left   1.28       0.56       1.14        0.55         +-------+-----------+-----------+------------+------------+ Arterial wall calcification precludes accurate ankle pressures and ABIs.  Summary: Right: Resting right ankle-brachial index indicates noncompressible right lower extremity arteries. The right toe-brachial index is abnormal. ABIs are unreliable. Left: Resting left ankle-brachial index is within normal range. No evidence of significant left lower extremity arterial disease. The left toe-brachial index is abnormal.  *See table(s) above for measurements and observations.  Electronically signed by Ruta Hinds MD on 05/13/2020 at 5:12:38 PM.   Final       Subjective: Patient seen and examined in dialysis unit, undergoing dialysis currently.  No complaints other than poor sleep overnight.  Seen by vascular surgery this morning, patient states wants to return home and follow-up outpatient for planned AV graft placement.  No other complaints or concerns at this time.  Denies headache, no fever/chills/night sweats, no nausea/vomiting/diarrhea, no chest pain, palpitations, no shortness of breath, no abdominal pain.  No acute events overnight per nursing staff.  Discharge Exam: Vitals:   05/14/20 0900 05/14/20 0930  BP: (!)  106/59 (!) 102/52  Pulse: 68 72  Resp:    Temp:    SpO2:     Vitals:  05/14/20 0800 05/14/20 0830 05/14/20 0900 05/14/20 0930  BP: (!) 92/47 (!) 101/52 (!) 106/59 (!) 102/52  Pulse: 65 66 68 72  Resp:      Temp:      TempSrc:      SpO2:      Weight:      Height:        General: Pt is alert, awake, not in acute distress Cardiovascular: RRR, S1/S2 +, no rubs, no gallops Respiratory: CTA bilaterally, no wheezing, no rhonchi Abdominal: Soft, NT, ND, bowel sounds + Extremities: no edema, no cyanosis    The results of significant diagnostics from this hospitalization (including imaging, microbiology, ancillary and laboratory) are listed below for reference.     Microbiology: Recent Results (from the past 240 hour(s))  SARS Coronavirus 2 by RT PCR (hospital order, performed in [facility] hospital lab) Nasopharyngeal Nasopharyngeal Swab     Status: None   Collection Time: 05/11/20  4:55 AM   Specimen: Nasopharyngeal Swab  Result Value Ref Range Status   SARS Coronavirus 2 NEGATIVE NEGATIVE Final    Comment: (NOTE) SARS-CoV-2 target nucleic acids are NOT DETECTED.  The SARS-CoV-2 RNA is generally detectable in upper and lower respiratory specimens during the acute phase of infection. The lowest concentration of SARS-CoV-2 viral copies this assay can detect is 250 copies / mL. A negative result does not preclude SARS-CoV-2 infection and should not be used as the sole basis for treatment or other patient management decisions.  A negative result may occur with improper specimen collection / handling, submission of specimen other than nasopharyngeal swab, presence of viral mutation(s) within the areas targeted by this assay, and inadequate number of viral copies (<250 copies / mL). A negative result must be combined with clinical observations, patient history, and epidemiological information.  Fact Sheet for Patients:   StrictlyIdeas.no  Fact Sheet  for Healthcare Providers: BankingDealers.co.za  This test is not yet approved or  cleared by the [place] FDA and has been authorized for detection and/or diagnosis of SARS-CoV-2 by FDA under an Emergency Use Authorization (EUA).  This EUA will remain in effect (meaning this test can be used) for the duration of the COVID-19 declaration under Section 564(b)(1) of the Act, 21 U.S.C. section 360bbb-3(b)(1), unless the authorization is terminated or revoked sooner.  Performed at [facility], [place] [address], [place], [place] [address]      Labs: BNP (last 3 results) No results for input(s): BNP in the last 8760 hours. Basic Metabolic Panel: Recent Labs  Lab 05/11/20 0110 05/11/20 0239 05/11/20 0616 05/11/20 2312 05/14/20 0408  NA 141 140 140 138 137  K 4.3 4.2 5.9* 5.8* 4.3  CL 99 100 100 100 96*  CO2 27  --  29 24 30   GLUCOSE 144* 128* 103* 180* 84  BUN 28* 35* 27* 32* 33*  CREATININE 8.36* 7.80* 7.94* 8.55* 9.09*  CALCIUM 7.7*  --  7.4* 7.6* 7.5*  PHOS  --   --   --  4.8*  --    Liver Function Tests: Recent Labs  Lab 05/11/20 0110 05/11/20 2312  AST 15  --   ALT 10  --   ALKPHOS 60  --   BILITOT 0.6  --   PROT 5.2*  --   ALBUMIN 2.7* 2.9*   No results for input(s): LIPASE, AMYLASE in the last 168 hours. No results for input(s): AMMONIA in the last 168 hours. CBC: Recent Labs  Lab 05/11/20 0110 05/11/20  0110 05/11/20 0239 05/11/20 0616 05/12/20 0300 05/13/20 0206 05/14/20 0408  WBC 8.1  --   --  8.7 7.3 6.2 5.5  NEUTROABS 2.6  --   --   --   --   --   --   HGB 9.0*   < > 10.2* 10.6* 8.7* 7.9* 7.6*  HCT 31.3*   < > 30.0* 34.1* 26.9* 25.1* 25.0*  MCV 101.3*  --   --  96.1 92.8 94.7 94.7  PLT 118*  --   --  82* 77* 84* 78*   < > = values in this interval not displayed.   Cardiac Enzymes: No results for input(s): CKTOTAL, CKMB, CKMBINDEX, TROPONINI in the last 168 hours. BNP: Invalid input(s): POCBNP CBG: No results  for input(s): GLUCAP in the last 168 hours. D-Dimer No results for input(s): DDIMER in the last 72 hours. Hgb A1c No results for input(s): HGBA1C in the last 72 hours. Lipid Profile No results for input(s): CHOL, HDL, LDLCALC, TRIG, CHOLHDL, LDLDIRECT in the last 72 hours. Thyroid function studies No results for input(s): TSH, T4TOTAL, T3FREE, THYROIDAB in the last 72 hours.  Invalid input(s): FREET3 Anemia work up No results for input(s): VITAMINB12, FOLATE, FERRITIN, TIBC, IRON, RETICCTPCT in the last 72 hours. Urinalysis No results found for: COLORURINE, APPEARANCEUR, [place], [place], [place], [place], [place], [place], PROTEINUR, UROBILINOGEN, NITRITE, LEUKOCYTESUR Sepsis Labs Invalid input(s): PROCALCITONIN,  WBC,  LACTICIDVEN Microbiology Recent Results (from the past 240 hour(s))  SARS Coronavirus 2 by RT PCR (hospital order, performed in [facility] hospital lab) Nasopharyngeal Nasopharyngeal Swab     Status: None   Collection Time: 05/11/20  4:55 AM   Specimen: Nasopharyngeal Swab  Result Value Ref Range Status   SARS Coronavirus 2 NEGATIVE NEGATIVE Final    Comment: (NOTE) SARS-CoV-2 target nucleic acids are NOT DETECTED.  The SARS-CoV-2 RNA is generally detectable in upper and lower respiratory specimens during the acute phase of infection. The lowest concentration of SARS-CoV-2 viral copies this assay can detect is 250 copies / mL. A negative result does not preclude SARS-CoV-2 infection and should not be used as the sole basis for treatment or other patient management decisions.  A negative result may occur with improper specimen collection / handling, submission of specimen other than nasopharyngeal swab, presence of viral mutation(s) within the areas targeted by this assay, and inadequate number of viral copies (<250 copies / mL). A negative result must be combined with clinical observations, patient history, and epidemiological information.  Fact Sheet for  Patients:   StrictlyIdeas.no  Fact Sheet for Healthcare Providers: BankingDealers.co.za  This test is not yet approved or  cleared by the [place] FDA and has been authorized for detection and/or diagnosis of SARS-CoV-2 by FDA under an Emergency Use Authorization (EUA).  This EUA will remain in effect (meaning this test can be used) for the duration of the COVID-19 declaration under Section 564(b)(1) of the Act, 21 U.S.C. section 360bbb-3(b)(1), unless the authorization is terminated or revoked sooner.  Performed at [facility], [place] [address] [place], [place] [address]      Time coordinating discharge: Over 30 minutes  SIGNED:   Tarvaris Puglia J [place] DO  [facility] 05/14/2020, 9:36 AM

## 2020-05-14 NOTE — Progress Notes (Signed)
  [facility] Progress Note    Assessment/ Plan:   1.  AVG dysfunction/hemorrhage: S/p ulceration repair + left thigh TDC placement 8/22 by VVS.  Planning for new AVG R leg, per VVS.  2.  ESRD: Continue HD per TTS schedule - no heparin for now. HD today, is on now.  3.  Hypertension/volume: BP stable, no volume excess on exam, close to edw. 4.  Anemia of ABL/ ESRD: Hgb stable 7.6 today, down from 11 range due to acute blood loss related to L thigh AVG bleeding, now resolved 5.  Metabolic bone disease: CorrCa ok, Phos pending. 6.  Nutrition:  Alb low, start supplements 7.  Dispo - per pmd for dc today  Dialysis Orders:  TTS [facility]  3h 22min  450/1.5  83.5kg  2/2 bath  AVG   Hep 5000 - Hectoral 6mcg IV q HD - No ESA, Hgb 12.2 on 8/19  Kelly Splinter, MD 05/14/2020, 10:20 AM     Subjective:   Seen on HD, no new c/o's.    Objective:   BP (!) 93/45 (BP Location: Left Arm)   Pulse 71   Temp (!) 97.5 F (36.4 C) (Oral)   Resp 17   Ht 5\' 5"  (1.651 m)   Wt 85.7 kg   LMP 02/16/2012   SpO2 99%   BMI 31.44 kg/m   Intake/Output Summary (Last 24 hours) at 05/14/2020 1020 Last data filed at 05/14/2020 0110 Gross per 24 hour  Intake 120 ml  Output --  Net 120 ml   Weight change: 0.363 kg  Physical Exam: Gen:nad, comfortable appearing, sitting up in chair CVS:reg rate Resp:cta bl, no w/r/r/c, unlabored, bl chest expansion JXB:JYNWG, soft, nt/nd NFA:OZHYQMVH left thigh, dressings in place, left thigh tdc in place, mild pretib edema bilat Neuro: speech clear and coherent, moves all extremities spontaneously   Labs: BMET Recent Labs  Lab 05/11/20 0110 05/11/20 0239 05/11/20 0616 05/11/20 2312 05/14/20 0408  NA 141 140 140 138 137  K 4.3 4.2 5.9* 5.8* 4.3  CL 99 100 100 100 96*  CO2 27  --  29 24 30   GLUCOSE 144* 128* 103* 180* 84  BUN 28* 35* 27* 32* 33*  CREATININE 8.36* 7.80* 7.94* 8.55* 9.09*  CALCIUM 7.7*  --  7.4* 7.6* 7.5*  PHOS  --   --   --  4.8*   --    CBC Recent Labs  Lab 05/11/20 0110 05/11/20 0239 05/11/20 0616 05/12/20 0300 05/13/20 0206 05/14/20 0408  WBC 8.1   < > 8.7 7.3 6.2 5.5  NEUTROABS 2.6  --   --   --   --   --   HGB 9.0*   < > 10.6* 8.7* 7.9* 7.6*  HCT 31.3*   < > 34.1* 26.9* 25.1* 25.0*  MCV 101.3*   < > 96.1 92.8 94.7 94.7  PLT 118*   < > 82* 77* 84* 78*   < > = values in this interval not displayed.    Medications:    . (feeding supplement) PROSource Plus  30 mL Oral BID BM  . Chlorhexidine Gluconate Cloth  6 each Topical Q0600  . cinacalcet  180 mg Oral QPC supper  . heparin sodium (porcine)      . sevelamer carbonate  4,000 mg Oral TID WC

## 2020-05-16 DIAGNOSIS — T82590A Other mechanical complication of surgically created arteriovenous fistula, initial encounter: Secondary | ICD-10-CM | POA: Diagnosis not present

## 2020-05-16 DIAGNOSIS — N2581 Secondary hyperparathyroidism of renal origin: Secondary | ICD-10-CM | POA: Diagnosis not present

## 2020-05-16 DIAGNOSIS — D631 Anemia in chronic kidney disease: Secondary | ICD-10-CM | POA: Diagnosis not present

## 2020-05-16 DIAGNOSIS — I120 Hypertensive chronic kidney disease with stage 5 chronic kidney disease or end stage renal disease: Secondary | ICD-10-CM | POA: Diagnosis not present

## 2020-05-16 DIAGNOSIS — D62 Acute posthemorrhagic anemia: Secondary | ICD-10-CM | POA: Diagnosis not present

## 2020-05-16 DIAGNOSIS — Z992 Dependence on renal dialysis: Secondary | ICD-10-CM | POA: Diagnosis not present

## 2020-05-16 DIAGNOSIS — D259 Leiomyoma of uterus, unspecified: Secondary | ICD-10-CM | POA: Diagnosis not present

## 2020-05-16 DIAGNOSIS — N186 End stage renal disease: Secondary | ICD-10-CM | POA: Diagnosis not present

## 2020-05-18 DIAGNOSIS — N186 End stage renal disease: Secondary | ICD-10-CM | POA: Diagnosis not present

## 2020-05-18 DIAGNOSIS — D631 Anemia in chronic kidney disease: Secondary | ICD-10-CM | POA: Diagnosis not present

## 2020-05-18 DIAGNOSIS — Z992 Dependence on renal dialysis: Secondary | ICD-10-CM | POA: Diagnosis not present

## 2020-05-18 DIAGNOSIS — N2581 Secondary hyperparathyroidism of renal origin: Secondary | ICD-10-CM | POA: Diagnosis not present

## 2020-05-18 DIAGNOSIS — D259 Leiomyoma of uterus, unspecified: Secondary | ICD-10-CM | POA: Diagnosis not present

## 2020-05-21 DIAGNOSIS — D631 Anemia in chronic kidney disease: Secondary | ICD-10-CM | POA: Diagnosis not present

## 2020-05-21 DIAGNOSIS — N2581 Secondary hyperparathyroidism of renal origin: Secondary | ICD-10-CM | POA: Diagnosis not present

## 2020-05-21 DIAGNOSIS — N186 End stage renal disease: Secondary | ICD-10-CM | POA: Diagnosis not present

## 2020-05-21 DIAGNOSIS — Z992 Dependence on renal dialysis: Secondary | ICD-10-CM | POA: Diagnosis not present

## 2020-05-21 DIAGNOSIS — D259 Leiomyoma of uterus, unspecified: Secondary | ICD-10-CM | POA: Diagnosis not present

## 2020-05-22 DIAGNOSIS — N186 End stage renal disease: Secondary | ICD-10-CM | POA: Diagnosis not present

## 2020-05-22 DIAGNOSIS — Z992 Dependence on renal dialysis: Secondary | ICD-10-CM | POA: Diagnosis not present

## 2020-05-22 DIAGNOSIS — Q612 Polycystic kidney, adult type: Secondary | ICD-10-CM | POA: Diagnosis not present

## 2020-05-23 ENCOUNTER — Telehealth: Payer: Self-pay

## 2020-05-23 ENCOUNTER — Other Ambulatory Visit: Payer: Self-pay

## 2020-05-23 DIAGNOSIS — Z992 Dependence on renal dialysis: Secondary | ICD-10-CM | POA: Diagnosis not present

## 2020-05-23 DIAGNOSIS — D509 Iron deficiency anemia, unspecified: Secondary | ICD-10-CM | POA: Diagnosis not present

## 2020-05-23 DIAGNOSIS — D631 Anemia in chronic kidney disease: Secondary | ICD-10-CM | POA: Diagnosis not present

## 2020-05-23 DIAGNOSIS — N2581 Secondary hyperparathyroidism of renal origin: Secondary | ICD-10-CM | POA: Diagnosis not present

## 2020-05-23 DIAGNOSIS — N186 End stage renal disease: Secondary | ICD-10-CM | POA: Diagnosis not present

## 2020-05-23 DIAGNOSIS — D259 Leiomyoma of uterus, unspecified: Secondary | ICD-10-CM | POA: Diagnosis not present

## 2020-05-23 NOTE — Telephone Encounter (Addendum)
Pt came into office today requesting to be scheduled for her Rt thigh graft procedure with Dr. Scot Dock after receiving missed calls from office. Pt scheduled for 06/11/20 and Covid test scheduled 9/20. Letter provider with instructions. Pt verbalized understanding.    Pt also requested to be seen to have a a suture  removed from her left thigh from repair of left thigh ulceration & femoral TDC placed on 05/11/20. Pt advised would need an appointment and scheduled to see PA on tomorrow at 3:15PM.   Pt also requested to address right arm pain and bleeding at old right arm graft site that has been gradually increasing over 2 years. States graft was  never been used because of graft failure. Informed pt this would need to be a separate appointment with Dr. Scot Dock on a clinic day. Pt left before appointment was scheduled. Will have pt contacted with information.

## 2020-05-23 NOTE — Telephone Encounter (Signed)
Spoke with Dr. Scot Dock who advised okay for pt to be seen in PA clinic with dialysis duplex before scheduled surgery to make sure no underlying infection in Rt. Arm. Pt scheduled for both duplex at [facility] and PA appt at 3:15PM on tomorrow. Unable to reach pt. Left message with pt's family member Harrell Lark with appt information to provide to pt. Verbalized understanding.

## 2020-05-24 ENCOUNTER — Other Ambulatory Visit (HOSPITAL_COMMUNITY): Payer: Self-pay | Admitting: Vascular Surgery

## 2020-05-24 ENCOUNTER — Other Ambulatory Visit: Payer: Self-pay

## 2020-05-24 ENCOUNTER — Encounter: Payer: Self-pay | Admitting: Physician Assistant

## 2020-05-24 ENCOUNTER — Ambulatory Visit (INDEPENDENT_AMBULATORY_CARE_PROVIDER_SITE_OTHER): Payer: Self-pay | Admitting: Physician Assistant

## 2020-05-24 ENCOUNTER — Ambulatory Visit (HOSPITAL_COMMUNITY)
Admission: RE | Admit: 2020-05-24 | Discharge: 2020-05-24 | Disposition: A | Discharge status: Home / Self Care | Payer: Medicare Other | Source: Ambulatory Visit | Attending: Vascular Surgery | Admitting: Vascular Surgery

## 2020-05-24 VITALS — BP 108/62 | HR 84 | Temp 97.9°F | Resp 20 | Ht 65.0 in | Wt 183.6 lb

## 2020-05-24 DIAGNOSIS — M79603 Pain in arm, unspecified: Secondary | ICD-10-CM

## 2020-05-24 DIAGNOSIS — Z992 Dependence on renal dialysis: Secondary | ICD-10-CM

## 2020-05-24 DIAGNOSIS — N186 End stage renal disease: Secondary | ICD-10-CM

## 2020-05-24 NOTE — Progress Notes (Addendum)
POST OPERATIVE OFFICE NOTE    CC:  F/u for surgery  HPI:  This is a 74 y.o. female who is s/p repair of ulceration of left thigh and placement of left femoral TDC on 05/11/2020 by Dr. Scot Dock. She had rubbed her left thigh graft and disrupted an eschar which resulted in significant bleeding.  She was brought to the emergency department by paramedics with a tight dressing on her thigh.  When Dr. Scot Dock evaluated the patient early this morning the graft had clotted.  Based on her history the plan was for no further revisions of this graft given that it was markedly degenerative.  I recommended addressing the eschar in the thigh and placing a tunneled dialysis catheter.  She did have ABI's to determine if she was a candidate for a right thigh AVG.  Dr. Scot Dock reviewed these prior to her discharge from the hospital and he was to arrange for her to have a right thigh AVG as an outpatient although she will be high risk for infection and wound healing given her morbid obesity.   Her surgery is scheduled for 06/11/2020 by Dr. Scot Dock and she will be getting her covid test on 06/10/2020.  She comes in today for that visit.  She did have some concerns about her right arm & pain and bleeding at the graft site and this has been gradually worsening over the past couple of years. The graft is not used as it is thrombosed.   She states that a couple of years ago, she wanted a record player that was on sale at [employer].  She states that she should've asked for help but didn't and when she picked it up, she felt something pop in the mid upper right arm.  She states ever since then, she gets some pain in that area and when she sleeps on it, it is painful.  She was scheduled for u/s today to look at this.  She has not had any bleeding in this area.    The pt is on dialysis T/T/S at [place] location.   No Known Allergies  Current Outpatient Medications  Medication Sig Dispense Refill  . acetaminophen  (TYLENOL) 500 MG tablet Take 500-1,000 mg by mouth 2 (two) times daily as needed for moderate pain (for back pain).     . ciclopirox (PENLAC) 8 % solution Apply 1 application topically at bedtime.     . cinacalcet (SENSIPAR) 90 MG tablet Take 180 mg by mouth daily after supper.     . diphenhydramine-acetaminophen (TYLENOL PM) 25-500 MG TABS tablet Take 1 tablet by mouth at bedtime as needed (Back pain).    Marland Kitchen ketotifen (ZADITOR) 0.025 % ophthalmic solution Place 1 drop into both eyes daily as needed (for dry eyes).    . midodrine (PROAMATINE) 10 MG tablet Take 10 mg by mouth Every Tuesday,Thursday,and Saturday with dialysis.    Marland Kitchen NONFORMULARY OR COMPOUNDED ITEM Pharmazen compound: Nail Fungus - Complex Nail and Foot Disorders - Fluconazole 1%, DMSO 25%, Fluocinonide 0.05%, Ketoconazole 2%, apply 1-2 pumps to each affected nail beds and feet twice daily. (Patient not taking: Reported on 09/20/2019) 120 each 11  . Nutritional Supplements (FEEDING SUPPLEMENT, NEPRO CARB STEADY,) LIQD Take 237 mLs by mouth every Tuesday, Thursday, and Saturday at 6 PM.     . sevelamer (RENVELA) 800 MG tablet Take 800-4,000 mg by mouth See admin instructions. Take 5 tablets with meals and take 2 tablets with snacks     No  current facility-administered medications for this visit.     ROS:  See HPI  Physical Exam:  Today's Vitals   05/24/20 1433  BP: 108/62  Pulse: 84  Resp: 20  Temp: 97.9 F (36.6 C)  TempSrc: Temporal  SpO2: 100%  Weight: 183 lb 9.6 oz (83.3 kg)  Height: 5\' 5"  (1.651 m)  PainSc: 7   PainLoc: Arm   Body mass index is 30.55 kg/m.   Incision:  Left thigh incision with scab and nylon suture in place Extremities:   There is a palpable right ulnar and left radial pulse.  She has brisk biphasic waveforms bilateral DP/PT/peroneal     Dialysis Duplex on 05/24/2020:  Summary:  Thrombosed AVG seen in the right upper extremity.  Brachial artery flow is normal.  Cephalic vein is patent.    Subclacian vein is patent.  Venous anastomosis was not visualized.  No evidence of hematoma or pseudoaneurysm.   ABI 05/13/2020: +-------+-----------+-----------+------------+------------+  ABI/TBIToday's ABIToday's TBIPrevious ABIPrevious TBI  +-------+-----------+-----------+------------+------------+  Right 1.3    0.64    1.47    0.69      +-------+-----------+-----------+------------+------------+  Left  1.28    0.56    1.14    0.55      +-------+-----------+-----------+------------+------------+  Arterial wall calcification precludes accurate ankle pressures and ABIs.    Summary:  Right: Resting right ankle-brachial index indicates noncompressible right  lower extremity arteries. The right toe-brachial index is abnormal. ABIs  are unreliable.   Left: Resting left ankle-brachial index is within normal range. No  evidence of significant left lower extremity arterial disease. The left  toe-brachial index is abnormal.   Assessment/Plan:  This is a 74 y.o. female who is s/p: repair of ulceration of left thigh and placement of left femoral TDC on 05/11/2020 by Dr. Scot Dock.  -the pt is scheduled for right thigh AVG on 06/11/2020 by Dr. Scot Dock.  Her doppler signals in bilateral feet are brisk biphasic signals.    -nylon suture in left thigh removed by Dr. Donzetta Matters. -her ultrasound of the right arm today reveals there is no evidence of psa or evidence of hematoma and I reassured the pt.  -discussed in detail groin wound care for after her thigh graft placement.  She expressed understanding.   Leontine Locket, [facility] [phone]  Clinic MD:  Donzetta Matters

## 2020-05-25 DIAGNOSIS — N2581 Secondary hyperparathyroidism of renal origin: Secondary | ICD-10-CM | POA: Diagnosis not present

## 2020-05-25 DIAGNOSIS — D259 Leiomyoma of uterus, unspecified: Secondary | ICD-10-CM | POA: Diagnosis not present

## 2020-05-25 DIAGNOSIS — Z992 Dependence on renal dialysis: Secondary | ICD-10-CM | POA: Diagnosis not present

## 2020-05-25 DIAGNOSIS — D509 Iron deficiency anemia, unspecified: Secondary | ICD-10-CM | POA: Diagnosis not present

## 2020-05-25 DIAGNOSIS — N186 End stage renal disease: Secondary | ICD-10-CM | POA: Diagnosis not present

## 2020-05-25 DIAGNOSIS — D631 Anemia in chronic kidney disease: Secondary | ICD-10-CM | POA: Diagnosis not present

## 2020-05-28 DIAGNOSIS — N186 End stage renal disease: Secondary | ICD-10-CM | POA: Diagnosis not present

## 2020-05-28 DIAGNOSIS — Z992 Dependence on renal dialysis: Secondary | ICD-10-CM | POA: Diagnosis not present

## 2020-05-28 DIAGNOSIS — D631 Anemia in chronic kidney disease: Secondary | ICD-10-CM | POA: Diagnosis not present

## 2020-05-28 DIAGNOSIS — N2581 Secondary hyperparathyroidism of renal origin: Secondary | ICD-10-CM | POA: Diagnosis not present

## 2020-05-28 DIAGNOSIS — D509 Iron deficiency anemia, unspecified: Secondary | ICD-10-CM | POA: Diagnosis not present

## 2020-05-28 DIAGNOSIS — D259 Leiomyoma of uterus, unspecified: Secondary | ICD-10-CM | POA: Diagnosis not present

## 2020-05-30 DIAGNOSIS — D259 Leiomyoma of uterus, unspecified: Secondary | ICD-10-CM | POA: Diagnosis not present

## 2020-05-30 DIAGNOSIS — Z992 Dependence on renal dialysis: Secondary | ICD-10-CM | POA: Diagnosis not present

## 2020-05-30 DIAGNOSIS — D631 Anemia in chronic kidney disease: Secondary | ICD-10-CM | POA: Diagnosis not present

## 2020-05-30 DIAGNOSIS — N2581 Secondary hyperparathyroidism of renal origin: Secondary | ICD-10-CM | POA: Diagnosis not present

## 2020-05-30 DIAGNOSIS — N186 End stage renal disease: Secondary | ICD-10-CM | POA: Diagnosis not present

## 2020-05-30 DIAGNOSIS — D509 Iron deficiency anemia, unspecified: Secondary | ICD-10-CM | POA: Diagnosis not present

## 2020-06-01 DIAGNOSIS — N2581 Secondary hyperparathyroidism of renal origin: Secondary | ICD-10-CM | POA: Diagnosis not present

## 2020-06-01 DIAGNOSIS — D631 Anemia in chronic kidney disease: Secondary | ICD-10-CM | POA: Diagnosis not present

## 2020-06-01 DIAGNOSIS — D259 Leiomyoma of uterus, unspecified: Secondary | ICD-10-CM | POA: Diagnosis not present

## 2020-06-01 DIAGNOSIS — N186 End stage renal disease: Secondary | ICD-10-CM | POA: Diagnosis not present

## 2020-06-01 DIAGNOSIS — Z992 Dependence on renal dialysis: Secondary | ICD-10-CM | POA: Diagnosis not present

## 2020-06-01 DIAGNOSIS — D509 Iron deficiency anemia, unspecified: Secondary | ICD-10-CM | POA: Diagnosis not present

## 2020-06-03 DIAGNOSIS — H25813 Combined forms of age-related cataract, bilateral: Secondary | ICD-10-CM | POA: Diagnosis not present

## 2020-06-04 DIAGNOSIS — D509 Iron deficiency anemia, unspecified: Secondary | ICD-10-CM | POA: Diagnosis not present

## 2020-06-04 DIAGNOSIS — N186 End stage renal disease: Secondary | ICD-10-CM | POA: Diagnosis not present

## 2020-06-04 DIAGNOSIS — Z992 Dependence on renal dialysis: Secondary | ICD-10-CM | POA: Diagnosis not present

## 2020-06-04 DIAGNOSIS — D259 Leiomyoma of uterus, unspecified: Secondary | ICD-10-CM | POA: Diagnosis not present

## 2020-06-04 DIAGNOSIS — N2581 Secondary hyperparathyroidism of renal origin: Secondary | ICD-10-CM | POA: Diagnosis not present

## 2020-06-04 DIAGNOSIS — D631 Anemia in chronic kidney disease: Secondary | ICD-10-CM | POA: Diagnosis not present

## 2020-06-06 DIAGNOSIS — D259 Leiomyoma of uterus, unspecified: Secondary | ICD-10-CM | POA: Diagnosis not present

## 2020-06-06 DIAGNOSIS — Z992 Dependence on renal dialysis: Secondary | ICD-10-CM | POA: Diagnosis not present

## 2020-06-06 DIAGNOSIS — N186 End stage renal disease: Secondary | ICD-10-CM | POA: Diagnosis not present

## 2020-06-06 DIAGNOSIS — N2581 Secondary hyperparathyroidism of renal origin: Secondary | ICD-10-CM | POA: Diagnosis not present

## 2020-06-06 DIAGNOSIS — D509 Iron deficiency anemia, unspecified: Secondary | ICD-10-CM | POA: Diagnosis not present

## 2020-06-06 DIAGNOSIS — D631 Anemia in chronic kidney disease: Secondary | ICD-10-CM | POA: Diagnosis not present

## 2020-06-08 DIAGNOSIS — N2581 Secondary hyperparathyroidism of renal origin: Secondary | ICD-10-CM | POA: Diagnosis not present

## 2020-06-08 DIAGNOSIS — Z992 Dependence on renal dialysis: Secondary | ICD-10-CM | POA: Diagnosis not present

## 2020-06-08 DIAGNOSIS — N186 End stage renal disease: Secondary | ICD-10-CM | POA: Diagnosis not present

## 2020-06-08 DIAGNOSIS — D259 Leiomyoma of uterus, unspecified: Secondary | ICD-10-CM | POA: Diagnosis not present

## 2020-06-08 DIAGNOSIS — D631 Anemia in chronic kidney disease: Secondary | ICD-10-CM | POA: Diagnosis not present

## 2020-06-08 DIAGNOSIS — D509 Iron deficiency anemia, unspecified: Secondary | ICD-10-CM | POA: Diagnosis not present

## 2020-06-10 ENCOUNTER — Other Ambulatory Visit (HOSPITAL_COMMUNITY)
Admission: RE | Admit: 2020-06-10 | Discharge: 2020-06-10 | Disposition: A | Discharge status: Home / Self Care | Payer: Medicare Other | Source: Ambulatory Visit | Attending: Vascular Surgery | Admitting: Vascular Surgery

## 2020-06-10 ENCOUNTER — Encounter (HOSPITAL_COMMUNITY): Payer: Self-pay | Admitting: Vascular Surgery

## 2020-06-10 ENCOUNTER — Telehealth: Payer: Self-pay

## 2020-06-10 DIAGNOSIS — Z20822 Contact with and (suspected) exposure to covid-19: Secondary | ICD-10-CM | POA: Insufficient documentation

## 2020-06-10 DIAGNOSIS — Z01812 Encounter for preprocedural laboratory examination: Secondary | ICD-10-CM | POA: Diagnosis not present

## 2020-06-10 DIAGNOSIS — D509 Iron deficiency anemia, unspecified: Secondary | ICD-10-CM | POA: Diagnosis not present

## 2020-06-10 DIAGNOSIS — Z992 Dependence on renal dialysis: Secondary | ICD-10-CM | POA: Diagnosis not present

## 2020-06-10 DIAGNOSIS — N2581 Secondary hyperparathyroidism of renal origin: Secondary | ICD-10-CM | POA: Diagnosis not present

## 2020-06-10 DIAGNOSIS — D631 Anemia in chronic kidney disease: Secondary | ICD-10-CM | POA: Diagnosis not present

## 2020-06-10 DIAGNOSIS — D259 Leiomyoma of uterus, unspecified: Secondary | ICD-10-CM | POA: Diagnosis not present

## 2020-06-10 DIAGNOSIS — N186 End stage renal disease: Secondary | ICD-10-CM | POA: Diagnosis not present

## 2020-06-10 LAB — SARS CORONAVIRUS 2 (TAT 6-24 HRS): SARS Coronavirus 2: NEGATIVE

## 2020-06-10 NOTE — Telephone Encounter (Signed)
Received call from [facility] with preadmission regarding concerns with pt traveling home after surgery via [facility] and no support person.  Spoke with patient regarding requirements and safety protocol. Patient contacted neighbor and then called back to verify that she would have a neighbor bring her to and from surgery, as well as stay with her afterwards.

## 2020-06-10 NOTE — Progress Notes (Signed)
Kia from MD's office called.  Patient now has found someone to stay with her overnight after surgery and has a ride home with a friend after being d/c tomorrow.  Will call patient to give instructions for DOS.

## 2020-06-10 NOTE — Progress Notes (Signed)
Patient does not have a ride home or anyone who could stay with her for 24 hours after being d/c from 06/11/20 surgery.  Patient was planning on using [facility] transportation but does not have any friends/family member who can ride with her home on the [employer] bus.  Kia at MD's office notified.  Kia states surgery will be cancelled.

## 2020-06-10 NOTE — Progress Notes (Signed)
PCP - [place] Cardiologist - n/a  Chest x-ray - 05/11/20 (1V) EKG - 05/11/20 Stress Test - 05/15/14 ECHO - 05/30/14 Cardiac Cath - n/a  Anesthesia review: Yes Patient had dialysis today, 06/10/20. (Dialysis normally on TTS)  STOP now taking any Aspirin (unless otherwise instructed by your surgeon), Aleve, Naproxen, Ibuprofen, Motrin, Advil, Goody's, BC's, all herbal medications, fish oil, and all vitamins.   Coronavirus Screening Covid test scheduled on 06/10/20 Do you have any of the following symptoms:  Cough yes/no: No Fever (>100.51F)  yes/no: No Runny nose yes/no: No Sore throat yes/no: No Difficulty breathing/shortness of breath  yes/no: No  Have you traveled in the last 14 days and where? yes/no: No  Patient verbalized understanding of instructions that were given via phone.

## 2020-06-11 ENCOUNTER — Encounter (HOSPITAL_COMMUNITY)
Admission: RE | Disposition: A | Discharge status: Home / Self Care | Payer: Self-pay | Source: Home / Self Care | Attending: Vascular Surgery

## 2020-06-11 ENCOUNTER — Ambulatory Visit (HOSPITAL_COMMUNITY)
Admission: RE | Admit: 2020-06-11 | Discharge: 2020-06-11 | Disposition: A | Discharge status: Home / Self Care | Payer: Medicare Other | Attending: Vascular Surgery | Admitting: Vascular Surgery

## 2020-06-11 ENCOUNTER — Other Ambulatory Visit: Payer: Self-pay

## 2020-06-11 ENCOUNTER — Ambulatory Visit (HOSPITAL_COMMUNITY): Payer: Medicare Other | Admitting: Physician Assistant

## 2020-06-11 ENCOUNTER — Encounter (HOSPITAL_COMMUNITY): Payer: Self-pay | Admitting: Vascular Surgery

## 2020-06-11 DIAGNOSIS — N186 End stage renal disease: Secondary | ICD-10-CM

## 2020-06-11 DIAGNOSIS — Z79899 Other long term (current) drug therapy: Secondary | ICD-10-CM | POA: Diagnosis not present

## 2020-06-11 DIAGNOSIS — D62 Acute posthemorrhagic anemia: Secondary | ICD-10-CM | POA: Insufficient documentation

## 2020-06-11 DIAGNOSIS — I120 Hypertensive chronic kidney disease with stage 5 chronic kidney disease or end stage renal disease: Secondary | ICD-10-CM | POA: Diagnosis not present

## 2020-06-11 DIAGNOSIS — D631 Anemia in chronic kidney disease: Secondary | ICD-10-CM | POA: Diagnosis not present

## 2020-06-11 DIAGNOSIS — Z992 Dependence on renal dialysis: Secondary | ICD-10-CM | POA: Diagnosis not present

## 2020-06-11 DIAGNOSIS — N185 Chronic kidney disease, stage 5: Secondary | ICD-10-CM | POA: Diagnosis not present

## 2020-06-11 HISTORY — DX: Dependence on other enabling machines and devices: Z99.89

## 2020-06-11 HISTORY — PX: AV FISTULA PLACEMENT: SHX1204

## 2020-06-11 LAB — POCT I-STAT, CHEM 8
BUN: 14 mg/dL (ref 8–23)
Calcium, Ion: 1.07 mmol/L — ABNORMAL LOW (ref 1.15–1.40)
Chloride: 100 mmol/L (ref 98–111)
Creatinine, Ser: 7 mg/dL — ABNORMAL HIGH (ref 0.44–1.00)
Glucose, Bld: 93 mg/dL (ref 70–99)
HCT: 32 % — ABNORMAL LOW (ref 36.0–46.0)
Hemoglobin: 10.9 g/dL — ABNORMAL LOW (ref 12.0–15.0)
Potassium: 3.9 mmol/L (ref 3.5–5.1)
Sodium: 141 mmol/L (ref 135–145)
TCO2: 28 mmol/L (ref 22–32)

## 2020-06-11 SURGERY — INSERTION OF ARTERIOVENOUS (AV) GORE-TEX GRAFT THIGH
Anesthesia: General | Site: Thigh | Laterality: Right

## 2020-06-11 MED ORDER — ORAL CARE MOUTH RINSE: 15.0000 mL | Freq: Once | OROMUCOSAL | Status: AC

## 2020-06-11 MED ORDER — LIDOCAINE 2% (20 MG/ML) 5 ML SYRINGE
INTRAMUSCULAR | Status: AC
  Filled 2020-06-11: qty 10

## 2020-06-11 MED ORDER — ESMOLOL HCL 100 MG/10ML IV SOLN
INTRAVENOUS | Status: AC
  Filled 2020-06-11: qty 10

## 2020-06-11 MED ORDER — PROTAMINE SULFATE 10 MG/ML IV SOLN
INTRAVENOUS | Status: AC
  Filled 2020-06-11: qty 5

## 2020-06-11 MED ORDER — PROPOFOL 10 MG/ML IV BOLUS
INTRAVENOUS | Status: DC | PRN
  Administered 2020-06-11: 150 mg via INTRAVENOUS

## 2020-06-11 MED ORDER — PROTAMINE SULFATE 10 MG/ML IV SOLN
INTRAVENOUS | Status: DC | PRN
  Administered 2020-06-11: 50 mg via INTRAVENOUS

## 2020-06-11 MED ORDER — FENTANYL CITRATE (PF) 100 MCG/2ML IJ SOLN
INTRAMUSCULAR | Status: DC | PRN
Start: 2020-06-11 — End: 2020-06-11
  Administered 2020-06-11 (×3): 50 ug via INTRAVENOUS
  Administered 2020-06-11 (×2): 25 ug via INTRAVENOUS

## 2020-06-11 MED ORDER — CHLORHEXIDINE GLUCONATE 0.12 % MT SOLN
OROMUCOSAL | Status: AC
  Administered 2020-06-11: 15 mL via OROMUCOSAL
  Filled 2020-06-11: qty 15

## 2020-06-11 MED ORDER — OXYCODONE-ACETAMINOPHEN 5-325 MG PO TABS: 1.0000 | ORAL_TABLET | ORAL | 0 refills | Status: DC | PRN

## 2020-06-11 MED ORDER — ONDANSETRON HCL 4 MG/2ML IJ SOLN
INTRAMUSCULAR | Status: AC
  Filled 2020-06-11: qty 2

## 2020-06-11 MED ORDER — DEXAMETHASONE SODIUM PHOSPHATE 10 MG/ML IJ SOLN
INTRAMUSCULAR | Status: DC | PRN
  Administered 2020-06-11: 4 mg via INTRAVENOUS

## 2020-06-11 MED ORDER — HEPARIN SODIUM (PORCINE) 1000 UNIT/ML IJ SOLN
INTRAMUSCULAR | Status: AC
  Filled 2020-06-11: qty 2

## 2020-06-11 MED ORDER — PHENYLEPHRINE 40 MCG/ML (10ML) SYRINGE FOR IV PUSH (FOR BLOOD PRESSURE SUPPORT)
PREFILLED_SYRINGE | INTRAVENOUS | Status: AC
  Filled 2020-06-11: qty 10

## 2020-06-11 MED ORDER — LIDOCAINE-EPINEPHRINE (PF) 1 %-1:200000 IJ SOLN
INTRAMUSCULAR | Status: AC
  Filled 2020-06-11: qty 30

## 2020-06-11 MED ORDER — HEPARIN SODIUM (PORCINE) 1000 UNIT/ML IJ SOLN
INTRAMUSCULAR | Status: DC | PRN
  Administered 2020-06-11: 8000 [IU] via INTRAVENOUS

## 2020-06-11 MED ORDER — ROCURONIUM BROMIDE 10 MG/ML (PF) SYRINGE
PREFILLED_SYRINGE | INTRAVENOUS | Status: AC
  Filled 2020-06-11: qty 10

## 2020-06-11 MED ORDER — CHLORHEXIDINE GLUCONATE 4 % EX LIQD: 60.0000 mL | Freq: Once | CUTANEOUS | Status: DC

## 2020-06-11 MED ORDER — PROPOFOL 10 MG/ML IV BOLUS
INTRAVENOUS | Status: AC
  Filled 2020-06-11: qty 20

## 2020-06-11 MED ORDER — EPHEDRINE 5 MG/ML INJ
INTRAVENOUS | Status: AC
  Filled 2020-06-11: qty 20

## 2020-06-11 MED ORDER — SODIUM CHLORIDE 0.9 % IV SOLN
INTRAVENOUS | Status: AC
  Filled 2020-06-11: qty 1.2

## 2020-06-11 MED ORDER — LACTATED RINGERS IV SOLN: INTRAVENOUS | Status: DC | PRN

## 2020-06-11 MED ORDER — CHLORHEXIDINE GLUCONATE 0.12 % MT SOLN: 15.0000 mL | Freq: Once | OROMUCOSAL | Status: AC

## 2020-06-11 MED ORDER — SODIUM CHLORIDE 0.9 % IV SOLN
INTRAVENOUS | Status: DC | PRN
  Administered 2020-06-11: 11:00:00 500 mL

## 2020-06-11 MED ORDER — LIDOCAINE HCL (PF) 1 % IJ SOLN
INTRAMUSCULAR | Status: AC
  Filled 2020-06-11: qty 30

## 2020-06-11 MED ORDER — PHENYLEPHRINE HCL-NACL 10-0.9 MG/250ML-% IV SOLN
INTRAVENOUS | Status: DC | PRN
  Administered 2020-06-11: 50 ug/min via INTRAVENOUS

## 2020-06-11 MED ORDER — CEFAZOLIN SODIUM-DEXTROSE 2-4 GM/100ML-% IV SOLN
2.0000 g | INTRAVENOUS | Status: AC
  Administered 2020-06-11: 2 g via INTRAVENOUS

## 2020-06-11 MED ORDER — EPHEDRINE SULFATE 50 MG/ML IJ SOLN
INTRAMUSCULAR | Status: DC | PRN
  Administered 2020-06-11 (×2): 10 mg via INTRAVENOUS

## 2020-06-11 MED ORDER — PHENYLEPHRINE HCL (PRESSORS) 10 MG/ML IV SOLN
INTRAVENOUS | Status: DC | PRN
  Administered 2020-06-11: 120 ug via INTRAVENOUS

## 2020-06-11 MED ORDER — LIDOCAINE HCL (CARDIAC) PF 100 MG/5ML IV SOSY
PREFILLED_SYRINGE | INTRAVENOUS | Status: DC | PRN
  Administered 2020-06-11: 40 mg via INTRAVENOUS

## 2020-06-11 MED ORDER — ONDANSETRON HCL 4 MG/2ML IJ SOLN
INTRAMUSCULAR | Status: DC | PRN
  Administered 2020-06-11: 4 mg via INTRAVENOUS

## 2020-06-11 MED ORDER — FENTANYL CITRATE (PF) 250 MCG/5ML IJ SOLN
INTRAMUSCULAR | Status: AC
  Filled 2020-06-11: qty 5

## 2020-06-11 MED ORDER — CEFAZOLIN SODIUM-DEXTROSE 2-4 GM/100ML-% IV SOLN
INTRAVENOUS | Status: AC
  Filled 2020-06-11: qty 100

## 2020-06-11 MED ORDER — SODIUM CHLORIDE 0.9 % IV SOLN: INTRAVENOUS | Status: DC

## 2020-06-11 MED ORDER — DEXAMETHASONE SODIUM PHOSPHATE 10 MG/ML IJ SOLN
INTRAMUSCULAR | Status: AC
  Filled 2020-06-11: qty 2

## 2020-06-11 MED ORDER — 0.9 % SODIUM CHLORIDE (POUR BTL) OPTIME
TOPICAL | Status: DC | PRN
  Administered 2020-06-11: 1000 mL

## 2020-06-11 SURGICAL SUPPLY — 39 items
ADH SKN CLS APL DERMABOND .7 (GAUZE/BANDAGES/DRESSINGS) ×1
CANISTER SUCT 3000ML PPV (MISCELLANEOUS) ×3 IMPLANT
CANNULA VESSEL 3MM 2 BLNT TIP (CANNULA) ×3 IMPLANT
CLIP VESOCCLUDE MED 6/CT (CLIP) ×5 IMPLANT
CLIP VESOCCLUDE SM WIDE 6/CT (CLIP) ×5 IMPLANT
COVER WAND RF STERILE (DRAPES) ×1 IMPLANT
DERMABOND ADVANCED (GAUZE/BANDAGES/DRESSINGS) ×2
DERMABOND ADVANCED .7 DNX12 (GAUZE/BANDAGES/DRESSINGS) ×1 IMPLANT
DRAPE HALF SHEET 40X57 (DRAPES) ×2 IMPLANT
DRAPE INCISE IOBAN 66X45 STRL (DRAPES) ×4 IMPLANT
DRAPE ORTHO SPLIT 77X108 STRL (DRAPES) ×3
DRAPE SURG ORHT 6 SPLT 77X108 (DRAPES) IMPLANT
ELECT REM PT RETURN 9FT ADLT (ELECTROSURGICAL) ×3
ELECTRODE REM PT RTRN 9FT ADLT (ELECTROSURGICAL) ×1 IMPLANT
GAUZE 4X4 16PLY RFD (DISPOSABLE) ×2 IMPLANT
GLOVE BIO SURGEON STRL SZ7.5 (GLOVE) ×3 IMPLANT
GLOVE BIOGEL PI IND STRL 6.5 (GLOVE) IMPLANT
GLOVE BIOGEL PI IND STRL 8 (GLOVE) ×1 IMPLANT
GLOVE BIOGEL PI INDICATOR 6.5 (GLOVE) ×2
GLOVE BIOGEL PI INDICATOR 8 (GLOVE) ×2
GLOVE ECLIPSE 6.5 STRL STRAW (GLOVE) ×2 IMPLANT
GOWN STRL REUS W/ TWL LRG LVL3 (GOWN DISPOSABLE) ×3 IMPLANT
GOWN STRL REUS W/TWL LRG LVL3 (GOWN DISPOSABLE) ×9
GRAFT GORETEX STRT 4-7X45 (Vascular Products) ×2 IMPLANT
KIT BASIN OR (CUSTOM PROCEDURE TRAY) ×3 IMPLANT
KIT TURNOVER KIT B (KITS) ×3 IMPLANT
LOOP VESSEL MINI RED (MISCELLANEOUS) ×2 IMPLANT
NS IRRIG 1000ML POUR BTL (IV SOLUTION) ×3 IMPLANT
PACK CV ACCESS (CUSTOM PROCEDURE TRAY) ×3 IMPLANT
PAD ARMBOARD 7.5X6 YLW CONV (MISCELLANEOUS) ×6 IMPLANT
SPONGE SURGIFOAM ABS GEL 100 (HEMOSTASIS) IMPLANT
SUT PROLENE 6 0 BV (SUTURE) ×8 IMPLANT
SUT VIC AB 2-0 CTB1 (SUTURE) ×5 IMPLANT
SUT VIC AB 3-0 SH 27 (SUTURE) ×6
SUT VIC AB 3-0 SH 27X BRD (SUTURE) ×2 IMPLANT
SUT VICRYL 4-0 PS2 18IN ABS (SUTURE) ×6 IMPLANT
TOWEL GREEN STERILE (TOWEL DISPOSABLE) ×3 IMPLANT
UNDERPAD 30X36 HEAVY ABSORB (UNDERPADS AND DIAPERS) ×3 IMPLANT
WATER STERILE IRR 1000ML POUR (IV SOLUTION) ×3 IMPLANT

## 2020-06-11 NOTE — Progress Notes (Signed)
Patient did not want text messaging option for her friend Dorene Ar.

## 2020-06-11 NOTE — Discharge Instructions (Signed)
[facility]  Discharge Instructions  AV Fistula or Graft Surgery for Dialysis Access  Please refer to the following instructions for your post-procedure care. Your surgeon or physician assistant will discuss any changes with you.  Activity  You may drive the day following your surgery, if you are comfortable and no longer taking prescription pain medication. Resume full activity as the soreness in your incision resolves.  Bathing/Showering  You may shower after you go home. Keep your incision dry for 48 hours. Do not soak in a bathtub, hot tub, or swim until the incision heals completely. You may not shower if you have a hemodialysis catheter.  Incision Care  Clean your incision with mild soap and water after 48 hours. Pat the area dry with a clean towel. You do not need a bandage unless otherwise instructed. Do not apply any ointments or creams to your incision. You may have skin glue on your incision. Do not peel it off. It will come off on its own in about one week. Your arm may swell a bit after surgery. To reduce swelling use pillows to elevate your arm so it is above your heart. Your doctor will tell you if you need to lightly wrap your arm with an ACE bandage.  Diet  Resume your normal diet. There are not special food restrictions following this procedure. In order to heal from your surgery, it is CRITICAL to get adequate nutrition. Your body requires vitamins, minerals, and protein. Vegetables are the best source of vitamins and minerals. Vegetables also provide the perfect balance of protein. Processed food has little nutritional value, so try to avoid this.  Medications  Resume taking all of your medications. If your incision is causing pain, you may take over-the counter pain relievers such as acetaminophen (Tylenol). If you were prescribed a stronger pain medication, please be aware these medications can cause nausea and constipation. Prevent  nausea by taking the medication with a snack or meal. Avoid constipation by drinking plenty of fluids and eating foods with high amount of fiber, such as fruits, vegetables, and grains.  Do not take Tylenol if you are taking prescription pain medications.  Follow up Your surgeon may want to see you in the office following your access surgery. If so, this will be arranged at the time of your surgery.  Please call us immediately for any of the following conditions:   Increased pain, redness, drainage (pus) from your incision site  Fever of 101 degrees or higher  Severe or worsening pain at your incision site  Hand pain or numbness.   Reduce your risk of vascular disease:   Stop smoking. If you would like help, call [facility] at 1-800-QUIT-NOW 279-081-4360) or [place] at [place] your cholesterol  Maintain a desired weight  Control your diabetes  Keep your blood pressure down  Dialysis  It will take several weeks to several months for your new dialysis access to be ready for use. Your surgeon will determine when it is okay to use it. Your nephrologist will continue to direct your dialysis. You can continue to use your Permcath until your new access is ready for use.  Resume Dialysis at your regularly scheduled days. Next Dialysis day is Thursday 06/13/20. Do not use right thigh AV graft for 6 weeks   06/11/2020 Sue Neal [id number] [birth year]  Surgeon(s): Angelia Mould, MD  Procedure(s): INSERTION OF ARTERIOVENOUS (AV) GORE-TEX GRAFT  RIGHT THIGH  May stick graft immediately   May stick graft on designated area only:   X Do not stick right thigh AV Graft  for 6 weeks    If you have any questions, please call the office at [phone].

## 2020-06-11 NOTE — Transfer of Care (Signed)
Immediate Anesthesia Transfer of Care Note  Patient: Sue Neal  Procedure(s) Performed: INSERTION OF ARTERIOVENOUS (AV) GORE-TEX GRAFT  RIGHT THIGH (Right Thigh)  Patient Location: PACU  Anesthesia Type:General  Level of Consciousness: awake and alert   Airway & Oxygen Therapy: Patient Spontanous Breathing and Patient connected to nasal cannula oxygen  Post-op Assessment: Report given to RN and Post -op Vital signs reviewed and stable  Post vital signs: Reviewed and stable  Last Vitals:  Vitals Value Taken Time  BP    Temp    Pulse    Resp    SpO2      Last Pain:  Vitals:   06/11/20 0911  TempSrc:   PainSc: 1       Patients Stated Pain Goal: 3 (89/21/19 4174)  Complications: No complications documented.

## 2020-06-11 NOTE — Interval H&P Note (Signed)
History and Physical Interval Note:  06/11/2020 9:48 AM  Sue Neal  has presented today for surgery, with the diagnosis of END STAGE RENAL DISEASE.  The various methods of treatment have been discussed with the patient and family. After consideration of risks, benefits and other options for treatment, the patient has consented to  Procedure(s): INSERTION OF ARTERIOVENOUS (AV) GORE-TEX GRAFT THIGH (Right) as a surgical intervention.  The patient's history has been reviewed, patient examined, no change in status, stable for surgery.  I have reviewed the patient's chart and labs.  Questions were answered to the patient's satisfaction.     [name]

## 2020-06-11 NOTE — Anesthesia Procedure Notes (Signed)
Procedure Name: LMA Insertion Date/Time: 06/11/2020 10:25 AM Performed by: Inda Coke, CRNA Pre-anesthesia Checklist: Patient identified, Emergency Drugs available, Suction available and Patient being monitored Patient Re-evaluated:Patient Re-evaluated prior to induction Oxygen Delivery Method: Circle System Utilized Preoxygenation: Pre-oxygenation with 100% oxygen Induction Type: IV induction Ventilation: Mask ventilation without difficulty LMA: LMA inserted LMA Size: 4.0 Number of attempts: 1 Airway Equipment and Method: Bite block Placement Confirmation: positive ETCO2 Tube secured with: Tape Dental Injury: Teeth and Oropharynx as per pre-operative assessment

## 2020-06-11 NOTE — Anesthesia Preprocedure Evaluation (Signed)
Anesthesia Evaluation  Patient identified by MRN, date of birth, ID band Patient awake    Reviewed: Allergy & Precautions, NPO status , Patient's Chart, lab work & pertinent test results  Airway Mallampati: II  TM Distance: >3 FB Neck ROM: Full    Dental  (+) Partial Upper, Edentulous Lower   Pulmonary former smoker,    breath sounds clear to auscultation       Cardiovascular hypertension,  Rhythm:Regular Rate:Normal     Neuro/Psych    GI/Hepatic   Endo/Other    Renal/GU      Musculoskeletal   Abdominal   Peds  Hematology   Anesthesia Other Findings   Reproductive/Obstetrics                             Anesthesia Physical Anesthesia Plan  ASA: III  Anesthesia Plan: General   Post-op Pain Management:    Induction: Intravenous  PONV Risk Score and Plan: Ondansetron and Dexamethasone  Airway Management Planned: LMA  Additional Equipment:   Intra-op Plan:   Post-operative Plan:   Informed Consent: I have reviewed the patients History and Physical, chart, labs and discussed the procedure including the risks, benefits and alternatives for the proposed anesthesia with the patient or authorized representative who has indicated his/her understanding and acceptance.     Dental advisory given  Plan Discussed with: CRNA and Anesthesiologist  Anesthesia Plan Comments:         Anesthesia Quick Evaluation

## 2020-06-11 NOTE — Anesthesia Postprocedure Evaluation (Signed)
Anesthesia Post Note  Patient: Sue Neal  Procedure(s) Performed: INSERTION OF ARTERIOVENOUS (AV) GORE-TEX GRAFT  RIGHT THIGH (Right Thigh)     Patient location during evaluation: PACU Anesthesia Type: General Level of consciousness: awake and alert Pain management: pain level controlled Vital Signs Assessment: post-procedure vital signs reviewed and stable Respiratory status: spontaneous breathing, nonlabored ventilation, respiratory function stable and patient connected to nasal cannula oxygen Cardiovascular status: blood pressure returned to baseline and stable Postop Assessment: no apparent nausea or vomiting Anesthetic complications: no   No complications documented.  Last Vitals:  Vitals:   06/11/20 1315 06/11/20 1330  BP: (!) 104/41 (!) 112/48  Pulse: 82 84  Resp: 16 (!) 22  Temp:  36.4 C  SpO2: 100% 95%    Last Pain:  Vitals:   06/11/20 1330  TempSrc:   PainSc: 0-No pain                 [name] COKER

## 2020-06-11 NOTE — Op Note (Signed)
    NAME: Sue Neal    MRN: [id number] DOB: 1946/06/22    DATE OF OPERATION: 06/11/2020  PREOP DIAGNOSIS:    End-stage renal disease  POSTOP DIAGNOSIS:    Same  PROCEDURE:    Placement of new right thigh AV graft (4-7 mm PTFE graft  SURGEON: Judeth Cornfield. Scot Dock, MD  ASSIST: Karoline Caldwell, PA  ANESTHESIA: General  EBL: Minimal  INDICATIONS:    Sue Neal is a 74 y.o. female who has a left femoral Diatek catheter. She presents for a right thigh graft. She had a large pannus.  FINDINGS:   I made the incision below the inguinal crease given that she was at very high risk for wound healing problems given her obesity. The artery and vein were significantly medially located. I used a 4-7 mm PTFE graft  TECHNIQUE:   The patient was taken to the operating room and received a general anesthetic. The right thigh was prepped and draped in usual sterile fashion. The pannus was taped superiorly. An oblique incision was made beneath the inguinal crease. Through this incision the common femoral artery and common femoral vein were dissected free. I could not identify the saphenous vein. A 4-7 mm PTFE graft was then tunneled in a loop fashion in the thigh with the arterial aspect of the graft tunneled laterally. The patient was heparinized. The common femoral artery was clamped proximally and distally and a longitudinal arteriotomy was made. Only a short segment of the 4 mm of the graft was excised, the graft spatulated and sewn end-to-side to the common femoral artery using continuous 6-0 Prolene suture. This was just below the inguinal ligament. Next the graft was slightly spatulated at the venous end. The femoral vein was clamped proximally distally and a longitudinal venotomy was made. The graft was sewn into side of the vein using continuous 6-0 Prolene suture. The completion was a good thrill in the fistula. Hemostasis was obtained in the wounds. The groin incision was closed  with 2 deep layers of 3-0 Vicryl and the skin closed with 4-0 Vicryl. The counterincision was closed with a deep layer of 3-0 Vicryl and the skin closed with 4-0 Vicryl. Dermabond was applied. There was a good thrill in the graft at the completion of the procedure.  Given the complexity of the case a first assistant was necessary in order to expedient the procedure and safely perform the technical aspects of the operation.  [name], MD, FACS [facility]  DATE OF DICTATION:   06/11/2020

## 2020-06-12 ENCOUNTER — Encounter (HOSPITAL_COMMUNITY): Payer: Self-pay | Admitting: Vascular Surgery

## 2020-06-12 ENCOUNTER — Telehealth: Payer: Self-pay

## 2020-06-12 NOTE — Telephone Encounter (Signed)
Pt s/p AVG yesterday called with questions regarding slight swelling and a little redness. She has no pain, fever, drainage. She described both swelling and redness as minimal. I have encouraged her to prop her arm up and elevate it. She is aware to call back if redness persists or worsens, if she has increased pain, swelling, drainage or fever. Pt verbalized understanding.

## 2020-06-13 DIAGNOSIS — Z48812 Encounter for surgical aftercare following surgery on the circulatory system: Secondary | ICD-10-CM | POA: Diagnosis not present

## 2020-06-13 DIAGNOSIS — M6281 Muscle weakness (generalized): Secondary | ICD-10-CM | POA: Diagnosis not present

## 2020-06-13 DIAGNOSIS — T82590D Other mechanical complication of surgically created arteriovenous fistula, subsequent encounter: Secondary | ICD-10-CM | POA: Diagnosis not present

## 2020-06-13 DIAGNOSIS — I120 Hypertensive chronic kidney disease with stage 5 chronic kidney disease or end stage renal disease: Secondary | ICD-10-CM | POA: Diagnosis not present

## 2020-06-13 DIAGNOSIS — Z4801 Encounter for change or removal of surgical wound dressing: Secondary | ICD-10-CM | POA: Diagnosis not present

## 2020-06-13 DIAGNOSIS — N186 End stage renal disease: Secondary | ICD-10-CM | POA: Diagnosis not present

## 2020-06-13 DIAGNOSIS — Z992 Dependence on renal dialysis: Secondary | ICD-10-CM | POA: Diagnosis not present

## 2020-06-14 DIAGNOSIS — I120 Hypertensive chronic kidney disease with stage 5 chronic kidney disease or end stage renal disease: Secondary | ICD-10-CM | POA: Diagnosis not present

## 2020-06-14 DIAGNOSIS — T82590D Other mechanical complication of surgically created arteriovenous fistula, subsequent encounter: Secondary | ICD-10-CM | POA: Diagnosis not present

## 2020-06-14 DIAGNOSIS — N186 End stage renal disease: Secondary | ICD-10-CM | POA: Diagnosis not present

## 2020-06-14 DIAGNOSIS — Z4801 Encounter for change or removal of surgical wound dressing: Secondary | ICD-10-CM | POA: Diagnosis not present

## 2020-06-14 DIAGNOSIS — Z992 Dependence on renal dialysis: Secondary | ICD-10-CM | POA: Diagnosis not present

## 2020-06-14 DIAGNOSIS — Z48812 Encounter for surgical aftercare following surgery on the circulatory system: Secondary | ICD-10-CM | POA: Diagnosis not present

## 2020-06-15 DIAGNOSIS — D631 Anemia in chronic kidney disease: Secondary | ICD-10-CM | POA: Diagnosis not present

## 2020-06-15 DIAGNOSIS — D259 Leiomyoma of uterus, unspecified: Secondary | ICD-10-CM | POA: Diagnosis not present

## 2020-06-15 DIAGNOSIS — N186 End stage renal disease: Secondary | ICD-10-CM | POA: Diagnosis not present

## 2020-06-15 DIAGNOSIS — D509 Iron deficiency anemia, unspecified: Secondary | ICD-10-CM | POA: Diagnosis not present

## 2020-06-15 DIAGNOSIS — N2581 Secondary hyperparathyroidism of renal origin: Secondary | ICD-10-CM | POA: Diagnosis not present

## 2020-06-15 DIAGNOSIS — Z992 Dependence on renal dialysis: Secondary | ICD-10-CM | POA: Diagnosis not present

## 2020-06-17 ENCOUNTER — Telehealth: Payer: Self-pay | Admitting: *Deleted

## 2020-06-17 DIAGNOSIS — I120 Hypertensive chronic kidney disease with stage 5 chronic kidney disease or end stage renal disease: Secondary | ICD-10-CM | POA: Diagnosis not present

## 2020-06-17 DIAGNOSIS — T82590D Other mechanical complication of surgically created arteriovenous fistula, subsequent encounter: Secondary | ICD-10-CM | POA: Diagnosis not present

## 2020-06-17 DIAGNOSIS — Z48812 Encounter for surgical aftercare following surgery on the circulatory system: Secondary | ICD-10-CM | POA: Diagnosis not present

## 2020-06-17 DIAGNOSIS — Z992 Dependence on renal dialysis: Secondary | ICD-10-CM | POA: Diagnosis not present

## 2020-06-17 DIAGNOSIS — Z4801 Encounter for change or removal of surgical wound dressing: Secondary | ICD-10-CM | POA: Diagnosis not present

## 2020-06-17 DIAGNOSIS — N186 End stage renal disease: Secondary | ICD-10-CM | POA: Diagnosis not present

## 2020-06-17 NOTE — Telephone Encounter (Signed)
Patient called c/o swelling of her right thigh, feet and legs s/p right thigh AVG 06/11/2020.  Patient denies pain. Her home health nurse  Lattie Haw T reported the patient had been sitting in a chair not elevating her legs.  She elevated the patients legs above the level of her heart and instructed her to continually do this.  Patient denies coldness or redness.  Patient is to call back if swelling worsens.  Patient verbalized understanding of the instructions.

## 2020-06-18 DIAGNOSIS — D259 Leiomyoma of uterus, unspecified: Secondary | ICD-10-CM | POA: Diagnosis not present

## 2020-06-18 DIAGNOSIS — N2581 Secondary hyperparathyroidism of renal origin: Secondary | ICD-10-CM | POA: Diagnosis not present

## 2020-06-18 DIAGNOSIS — Z992 Dependence on renal dialysis: Secondary | ICD-10-CM | POA: Diagnosis not present

## 2020-06-18 DIAGNOSIS — D631 Anemia in chronic kidney disease: Secondary | ICD-10-CM | POA: Diagnosis not present

## 2020-06-18 DIAGNOSIS — N186 End stage renal disease: Secondary | ICD-10-CM | POA: Diagnosis not present

## 2020-06-18 DIAGNOSIS — D509 Iron deficiency anemia, unspecified: Secondary | ICD-10-CM | POA: Diagnosis not present

## 2020-06-19 ENCOUNTER — Telehealth: Payer: Self-pay

## 2020-06-19 DIAGNOSIS — N186 End stage renal disease: Secondary | ICD-10-CM | POA: Diagnosis not present

## 2020-06-19 DIAGNOSIS — I120 Hypertensive chronic kidney disease with stage 5 chronic kidney disease or end stage renal disease: Secondary | ICD-10-CM | POA: Diagnosis not present

## 2020-06-19 DIAGNOSIS — Z4801 Encounter for change or removal of surgical wound dressing: Secondary | ICD-10-CM | POA: Diagnosis not present

## 2020-06-19 DIAGNOSIS — Z48812 Encounter for surgical aftercare following surgery on the circulatory system: Secondary | ICD-10-CM | POA: Diagnosis not present

## 2020-06-19 DIAGNOSIS — Z992 Dependence on renal dialysis: Secondary | ICD-10-CM | POA: Diagnosis not present

## 2020-06-19 DIAGNOSIS — T82590D Other mechanical complication of surgically created arteriovenous fistula, subsequent encounter: Secondary | ICD-10-CM | POA: Diagnosis not present

## 2020-06-19 NOTE — Telephone Encounter (Signed)
Danae Chen, nurse with [facility] called with concerns of mild redness to R thigh. I spoke to pt who c/o of ongoing swelling to R thigh and is concerned about the redness. She stated she would feel better having it looked at in the office. She showed the HD center and they told her to check with our office. I have scheduled her an appt with PA for tomorrow. Pt and Danae Chen are both aware and verbalized understanding.

## 2020-06-20 ENCOUNTER — Ambulatory Visit (INDEPENDENT_AMBULATORY_CARE_PROVIDER_SITE_OTHER): Payer: Self-pay | Admitting: Physician Assistant

## 2020-06-20 ENCOUNTER — Other Ambulatory Visit: Payer: Self-pay

## 2020-06-20 VITALS — BP 104/51 | HR 103 | Temp 98.8°F | Resp 20 | Ht 65.0 in | Wt 183.7 lb

## 2020-06-20 DIAGNOSIS — D259 Leiomyoma of uterus, unspecified: Secondary | ICD-10-CM | POA: Diagnosis not present

## 2020-06-20 DIAGNOSIS — D631 Anemia in chronic kidney disease: Secondary | ICD-10-CM | POA: Diagnosis not present

## 2020-06-20 DIAGNOSIS — D509 Iron deficiency anemia, unspecified: Secondary | ICD-10-CM | POA: Diagnosis not present

## 2020-06-20 DIAGNOSIS — N186 End stage renal disease: Secondary | ICD-10-CM | POA: Diagnosis not present

## 2020-06-20 DIAGNOSIS — Z992 Dependence on renal dialysis: Secondary | ICD-10-CM | POA: Diagnosis not present

## 2020-06-20 DIAGNOSIS — N2581 Secondary hyperparathyroidism of renal origin: Secondary | ICD-10-CM | POA: Diagnosis not present

## 2020-06-20 NOTE — Progress Notes (Signed)
    Postoperative Access Visit   History of Present Illness   Sue Neal is a 74 y.o. year old female who presents for postoperative follow-up for: right thigh arteriovenous graft by Dr. Scot Dock (Date: 06/11/20).  She comes into the office prior to scheduled follow-up due to mild redness around her incisions as well as right lower extremity swelling.  She states this started about 3 days after surgery and does not appear to be improving.  She denies any purulent drainage, increase in redness, fevers, chills, nausea/vomiting.  She denies any rest pain of the right foot.  She is dialyzing via left femoral TDC.  She dialyzes on a Tuesday Thursday Saturday schedule at the [facility] location on horse [address] in [place].  Physical Examination   Vitals:   06/20/20 1441  BP: (!) 104/51  Pulse: (!) 103  Resp: 20  Temp: 98.8 F (37.1 C)  TempSrc: Temporal  SpO2: 100%  Weight: 183 lb 11.2 oz (83.3 kg)  Height: 5\' 5"  (1.651 m)   Body mass index is 30.57 kg/m.  right leg Incisions are intact, pitting edema to the level of the proximal shin right leg; right foot is warm to touch with good capillary refill; mild redness around tunneled tract of right thigh AV graft; palpable thrill throughout graft; no areas of fluctuance; no drainage from incisions with manipulation of graft    Medical Decision Making   Sue Neal is a 74 y.o. year old female who presents s/p right thigh arteriovenous graft   Patent thigh graft without signs or symptoms of steal syndrome  There is some edema of right lower extremity; this is a common finding after this type of surgery; she can elevate her legs periodically during the day to alleviate  Patient also has some mild erythema around tunneled track of thigh graft; she will be started on vancomycin during HD for a duration of 2 weeks  We also discussed proper hygiene of right groin and thigh with soap and water cleansing  twice daily and to pat dry afterwards followed by dry dressing to groin fold  If the patient develops purulent drainage, increase in redness, or any systemic symptoms of infection she will call/return office sooner for consideration of thigh graft removal; she will otherwise follow-up in 2 weeks for recheck   Dagoberto Ligas PA-C [facility] [place] Office: [phone]  Clinic MD: Dr. Scot Dock

## 2020-06-21 DIAGNOSIS — Z992 Dependence on renal dialysis: Secondary | ICD-10-CM | POA: Diagnosis not present

## 2020-06-21 DIAGNOSIS — N186 End stage renal disease: Secondary | ICD-10-CM | POA: Diagnosis not present

## 2020-06-21 DIAGNOSIS — Q612 Polycystic kidney, adult type: Secondary | ICD-10-CM | POA: Diagnosis not present

## 2020-06-22 DIAGNOSIS — L986 Other infiltrative disorders of the skin and subcutaneous tissue: Secondary | ICD-10-CM | POA: Diagnosis not present

## 2020-06-22 DIAGNOSIS — D509 Iron deficiency anemia, unspecified: Secondary | ICD-10-CM | POA: Diagnosis not present

## 2020-06-22 DIAGNOSIS — Z992 Dependence on renal dialysis: Secondary | ICD-10-CM | POA: Diagnosis not present

## 2020-06-22 DIAGNOSIS — D259 Leiomyoma of uterus, unspecified: Secondary | ICD-10-CM | POA: Diagnosis not present

## 2020-06-22 DIAGNOSIS — D631 Anemia in chronic kidney disease: Secondary | ICD-10-CM | POA: Diagnosis not present

## 2020-06-22 DIAGNOSIS — N186 End stage renal disease: Secondary | ICD-10-CM | POA: Diagnosis not present

## 2020-06-22 DIAGNOSIS — N2581 Secondary hyperparathyroidism of renal origin: Secondary | ICD-10-CM | POA: Diagnosis not present

## 2020-06-24 DIAGNOSIS — T82590D Other mechanical complication of surgically created arteriovenous fistula, subsequent encounter: Secondary | ICD-10-CM | POA: Diagnosis not present

## 2020-06-24 DIAGNOSIS — I120 Hypertensive chronic kidney disease with stage 5 chronic kidney disease or end stage renal disease: Secondary | ICD-10-CM | POA: Diagnosis not present

## 2020-06-24 DIAGNOSIS — N186 End stage renal disease: Secondary | ICD-10-CM | POA: Diagnosis not present

## 2020-06-24 DIAGNOSIS — Z4801 Encounter for change or removal of surgical wound dressing: Secondary | ICD-10-CM | POA: Diagnosis not present

## 2020-06-24 DIAGNOSIS — Z48812 Encounter for surgical aftercare following surgery on the circulatory system: Secondary | ICD-10-CM | POA: Diagnosis not present

## 2020-06-24 DIAGNOSIS — Z992 Dependence on renal dialysis: Secondary | ICD-10-CM | POA: Diagnosis not present

## 2020-06-25 DIAGNOSIS — N186 End stage renal disease: Secondary | ICD-10-CM | POA: Diagnosis not present

## 2020-06-25 DIAGNOSIS — D259 Leiomyoma of uterus, unspecified: Secondary | ICD-10-CM | POA: Diagnosis not present

## 2020-06-25 DIAGNOSIS — N2581 Secondary hyperparathyroidism of renal origin: Secondary | ICD-10-CM | POA: Diagnosis not present

## 2020-06-25 DIAGNOSIS — D509 Iron deficiency anemia, unspecified: Secondary | ICD-10-CM | POA: Diagnosis not present

## 2020-06-25 DIAGNOSIS — Z992 Dependence on renal dialysis: Secondary | ICD-10-CM | POA: Diagnosis not present

## 2020-06-25 DIAGNOSIS — L986 Other infiltrative disorders of the skin and subcutaneous tissue: Secondary | ICD-10-CM | POA: Diagnosis not present

## 2020-06-26 ENCOUNTER — Encounter: Payer: Medicare Other | Admitting: Vascular Surgery

## 2020-06-27 DIAGNOSIS — I120 Hypertensive chronic kidney disease with stage 5 chronic kidney disease or end stage renal disease: Secondary | ICD-10-CM | POA: Diagnosis not present

## 2020-06-27 DIAGNOSIS — Z992 Dependence on renal dialysis: Secondary | ICD-10-CM | POA: Diagnosis not present

## 2020-06-27 DIAGNOSIS — R6889 Other general symptoms and signs: Secondary | ICD-10-CM | POA: Diagnosis not present

## 2020-06-27 DIAGNOSIS — N186 End stage renal disease: Secondary | ICD-10-CM | POA: Diagnosis not present

## 2020-06-27 DIAGNOSIS — N2581 Secondary hyperparathyroidism of renal origin: Secondary | ICD-10-CM | POA: Diagnosis not present

## 2020-06-27 DIAGNOSIS — T82590D Other mechanical complication of surgically created arteriovenous fistula, subsequent encounter: Secondary | ICD-10-CM | POA: Diagnosis not present

## 2020-06-27 DIAGNOSIS — D509 Iron deficiency anemia, unspecified: Secondary | ICD-10-CM | POA: Diagnosis not present

## 2020-06-27 DIAGNOSIS — Z48812 Encounter for surgical aftercare following surgery on the circulatory system: Secondary | ICD-10-CM | POA: Diagnosis not present

## 2020-06-27 DIAGNOSIS — D259 Leiomyoma of uterus, unspecified: Secondary | ICD-10-CM | POA: Diagnosis not present

## 2020-06-27 DIAGNOSIS — L986 Other infiltrative disorders of the skin and subcutaneous tissue: Secondary | ICD-10-CM | POA: Diagnosis not present

## 2020-06-27 DIAGNOSIS — Z4801 Encounter for change or removal of surgical wound dressing: Secondary | ICD-10-CM | POA: Diagnosis not present

## 2020-06-29 DIAGNOSIS — D259 Leiomyoma of uterus, unspecified: Secondary | ICD-10-CM | POA: Diagnosis not present

## 2020-06-29 DIAGNOSIS — Z992 Dependence on renal dialysis: Secondary | ICD-10-CM | POA: Diagnosis not present

## 2020-06-29 DIAGNOSIS — D509 Iron deficiency anemia, unspecified: Secondary | ICD-10-CM | POA: Diagnosis not present

## 2020-06-29 DIAGNOSIS — L986 Other infiltrative disorders of the skin and subcutaneous tissue: Secondary | ICD-10-CM | POA: Diagnosis not present

## 2020-06-29 DIAGNOSIS — N2581 Secondary hyperparathyroidism of renal origin: Secondary | ICD-10-CM | POA: Diagnosis not present

## 2020-06-29 DIAGNOSIS — N186 End stage renal disease: Secondary | ICD-10-CM | POA: Diagnosis not present

## 2020-07-01 DIAGNOSIS — Z48812 Encounter for surgical aftercare following surgery on the circulatory system: Secondary | ICD-10-CM | POA: Diagnosis not present

## 2020-07-01 DIAGNOSIS — T82590D Other mechanical complication of surgically created arteriovenous fistula, subsequent encounter: Secondary | ICD-10-CM | POA: Diagnosis not present

## 2020-07-01 DIAGNOSIS — I120 Hypertensive chronic kidney disease with stage 5 chronic kidney disease or end stage renal disease: Secondary | ICD-10-CM | POA: Diagnosis not present

## 2020-07-01 DIAGNOSIS — Z4801 Encounter for change or removal of surgical wound dressing: Secondary | ICD-10-CM | POA: Diagnosis not present

## 2020-07-01 DIAGNOSIS — N186 End stage renal disease: Secondary | ICD-10-CM | POA: Diagnosis not present

## 2020-07-01 DIAGNOSIS — Z992 Dependence on renal dialysis: Secondary | ICD-10-CM | POA: Diagnosis not present

## 2020-07-02 DIAGNOSIS — N2581 Secondary hyperparathyroidism of renal origin: Secondary | ICD-10-CM | POA: Diagnosis not present

## 2020-07-02 DIAGNOSIS — L986 Other infiltrative disorders of the skin and subcutaneous tissue: Secondary | ICD-10-CM | POA: Diagnosis not present

## 2020-07-02 DIAGNOSIS — Z992 Dependence on renal dialysis: Secondary | ICD-10-CM | POA: Diagnosis not present

## 2020-07-02 DIAGNOSIS — N186 End stage renal disease: Secondary | ICD-10-CM | POA: Diagnosis not present

## 2020-07-02 DIAGNOSIS — D259 Leiomyoma of uterus, unspecified: Secondary | ICD-10-CM | POA: Diagnosis not present

## 2020-07-02 DIAGNOSIS — D509 Iron deficiency anemia, unspecified: Secondary | ICD-10-CM | POA: Diagnosis not present

## 2020-07-03 DIAGNOSIS — T82590D Other mechanical complication of surgically created arteriovenous fistula, subsequent encounter: Secondary | ICD-10-CM | POA: Diagnosis not present

## 2020-07-03 DIAGNOSIS — Z48812 Encounter for surgical aftercare following surgery on the circulatory system: Secondary | ICD-10-CM | POA: Diagnosis not present

## 2020-07-03 DIAGNOSIS — Z992 Dependence on renal dialysis: Secondary | ICD-10-CM | POA: Diagnosis not present

## 2020-07-03 DIAGNOSIS — Z4801 Encounter for change or removal of surgical wound dressing: Secondary | ICD-10-CM | POA: Diagnosis not present

## 2020-07-03 DIAGNOSIS — I120 Hypertensive chronic kidney disease with stage 5 chronic kidney disease or end stage renal disease: Secondary | ICD-10-CM | POA: Diagnosis not present

## 2020-07-03 DIAGNOSIS — N186 End stage renal disease: Secondary | ICD-10-CM | POA: Diagnosis not present

## 2020-07-04 ENCOUNTER — Ambulatory Visit (INDEPENDENT_AMBULATORY_CARE_PROVIDER_SITE_OTHER): Payer: Self-pay | Admitting: Physician Assistant

## 2020-07-04 ENCOUNTER — Other Ambulatory Visit: Payer: Self-pay

## 2020-07-04 VITALS — BP 100/59 | HR 90 | Temp 97.9°F | Resp 20 | Ht 65.0 in | Wt 187.0 lb

## 2020-07-04 DIAGNOSIS — N2581 Secondary hyperparathyroidism of renal origin: Secondary | ICD-10-CM | POA: Diagnosis not present

## 2020-07-04 DIAGNOSIS — Z992 Dependence on renal dialysis: Secondary | ICD-10-CM | POA: Diagnosis not present

## 2020-07-04 DIAGNOSIS — R6889 Other general symptoms and signs: Secondary | ICD-10-CM | POA: Diagnosis not present

## 2020-07-04 DIAGNOSIS — L986 Other infiltrative disorders of the skin and subcutaneous tissue: Secondary | ICD-10-CM | POA: Diagnosis not present

## 2020-07-04 DIAGNOSIS — M7989 Other specified soft tissue disorders: Secondary | ICD-10-CM

## 2020-07-04 DIAGNOSIS — N186 End stage renal disease: Secondary | ICD-10-CM | POA: Diagnosis not present

## 2020-07-04 DIAGNOSIS — D259 Leiomyoma of uterus, unspecified: Secondary | ICD-10-CM | POA: Diagnosis not present

## 2020-07-04 DIAGNOSIS — D509 Iron deficiency anemia, unspecified: Secondary | ICD-10-CM | POA: Diagnosis not present

## 2020-07-04 NOTE — Progress Notes (Signed)
POST OPERATIVE OFFICE NOTE    CC:  F/u for surgery  HPI:  This is a 74 y.o. female who is s/p right thigh AVG on 06/11/2020 by Dr. Scot Dock.  She previously had a left femoral TDC placed on 05/11/2020 also by Dr. Scot Dock.   Pt was seen on 06/20/2020 at which time she had some redness around the tunnel site and incisions.  She was placed on vancomycin with HD for two weeks.  She was taught about proper hygiene for groin incision care.   Pt returns today for follow up.  She states she still has some swelling around the graft, upper thigh and lower leg.  She states the swelling is a little better when she wakes in the morning.  She has been elevating her legs, but not high enough.  She has not had any shortness of breath or chest pain.  She states her catheter is working well.  She has not had any fevers or chills.  She has not had any drainage from the incisions.   She dialyzes on T/T/S at the [place] location.   No Known Allergies  Current Outpatient Medications  Medication Sig Dispense Refill  . acetaminophen (TYLENOL) 500 MG tablet Take 500-1,000 mg by mouth 2 (two) times daily as needed for moderate pain (for back pain).     . cinacalcet (SENSIPAR) 90 MG tablet Take 90 mg by mouth in the morning and at bedtime.     . heparin 1000 unit/mL SOLN injection Heparin Sodium (Porcine) 1,000 Units/mL Catheter Lock Arterial    . ketotifen (ZADITOR) 0.025 % ophthalmic solution Place 1 drop into both eyes daily as needed (for dry eyes).    . midodrine (PROAMATINE) 10 MG tablet Take 10 mg by mouth Every Tuesday,Thursday,and Saturday with dialysis.    Marland Kitchen NONFORMULARY OR COMPOUNDED ITEM Pharmazen compound: Nail Fungus - Complex Nail and Foot Disorders - Fluconazole 1%, DMSO 25%, Fluocinonide 0.05%, Ketoconazole 2%, apply 1-2 pumps to each affected nail beds and feet twice daily. 120 each 11  . Nutritional Supplements (FEEDING SUPPLEMENT, NEPRO CARB STEADY,) LIQD Take 237 mLs by mouth every  Tuesday, Thursday, and Saturday at 6 PM.     . oxyCODONE-acetaminophen (PERCOCET) 5-325 MG tablet Take 1 tablet by mouth every 4 (four) hours as needed for severe pain. 20 tablet 0  . sevelamer (RENVELA) 800 MG tablet Take 1,600-4,000 mg by mouth See admin instructions. Take 4000 mg  tablets with meals and take 1600 mg tablets with snacks     No current facility-administered medications for this visit.     ROS:  See HPI  Physical Exam:  Today's Vitals   07/04/20 1334  BP: (!) 100/59  Pulse: 90  Resp: 20  Temp: 97.9 F (36.6 C)  SpO2: 97%  Weight: 187 lb (84.8 kg)  Height: 5\' 5"  (1.651 m)   Body mass index is 31.12 kg/m.   Incision:  Right groin and counterincision are clean Extremities:  The graft has an excellent thrill present.  There continues to be edema around the tunneled area.  She continues to have swelling in her right leg.  Her calf is soft and non tender.  Brisk doppler signals right DP/PT/peroneal.  Left DP is palpable.   Assessment/Plan:  This is a 74 y.o. female who is s/p: right thigh AVG on 06/11/2020 by Dr. Scot Dock.  She previously had a left femoral TDC placed on 05/11/2020 also by Dr. Scot Dock.    -pt continues to have  some swelling around the graft and the right leg.  Her calf is soft and non tender and she denies any shortness of breath or chest pain.  Instructed pt on how to elevate leg and she was measured for knee high 15-85mmHg compression socks and she did get a pair today.   -she was again instructed on keeping her groin incision clean and dry. -she will f/u in 2 weeks to to check her progress.  She will call us sooner if she has any issues.   -would hold off on using the graft until the swelling around graft improves.    Leontine Locket, [facility] [phone]  Clinic MD:  Scot Dock

## 2020-07-06 DIAGNOSIS — D509 Iron deficiency anemia, unspecified: Secondary | ICD-10-CM | POA: Diagnosis not present

## 2020-07-06 DIAGNOSIS — Z992 Dependence on renal dialysis: Secondary | ICD-10-CM | POA: Diagnosis not present

## 2020-07-06 DIAGNOSIS — N2581 Secondary hyperparathyroidism of renal origin: Secondary | ICD-10-CM | POA: Diagnosis not present

## 2020-07-06 DIAGNOSIS — N186 End stage renal disease: Secondary | ICD-10-CM | POA: Diagnosis not present

## 2020-07-06 DIAGNOSIS — L986 Other infiltrative disorders of the skin and subcutaneous tissue: Secondary | ICD-10-CM | POA: Diagnosis not present

## 2020-07-06 DIAGNOSIS — D259 Leiomyoma of uterus, unspecified: Secondary | ICD-10-CM | POA: Diagnosis not present

## 2020-07-09 DIAGNOSIS — N2581 Secondary hyperparathyroidism of renal origin: Secondary | ICD-10-CM | POA: Diagnosis not present

## 2020-07-09 DIAGNOSIS — Z992 Dependence on renal dialysis: Secondary | ICD-10-CM | POA: Diagnosis not present

## 2020-07-09 DIAGNOSIS — D259 Leiomyoma of uterus, unspecified: Secondary | ICD-10-CM | POA: Diagnosis not present

## 2020-07-09 DIAGNOSIS — L986 Other infiltrative disorders of the skin and subcutaneous tissue: Secondary | ICD-10-CM | POA: Diagnosis not present

## 2020-07-09 DIAGNOSIS — D509 Iron deficiency anemia, unspecified: Secondary | ICD-10-CM | POA: Diagnosis not present

## 2020-07-09 DIAGNOSIS — N186 End stage renal disease: Secondary | ICD-10-CM | POA: Diagnosis not present

## 2020-07-11 DIAGNOSIS — N2581 Secondary hyperparathyroidism of renal origin: Secondary | ICD-10-CM | POA: Diagnosis not present

## 2020-07-11 DIAGNOSIS — D509 Iron deficiency anemia, unspecified: Secondary | ICD-10-CM | POA: Diagnosis not present

## 2020-07-11 DIAGNOSIS — D259 Leiomyoma of uterus, unspecified: Secondary | ICD-10-CM | POA: Diagnosis not present

## 2020-07-11 DIAGNOSIS — L986 Other infiltrative disorders of the skin and subcutaneous tissue: Secondary | ICD-10-CM | POA: Diagnosis not present

## 2020-07-11 DIAGNOSIS — Z992 Dependence on renal dialysis: Secondary | ICD-10-CM | POA: Diagnosis not present

## 2020-07-11 DIAGNOSIS — N186 End stage renal disease: Secondary | ICD-10-CM | POA: Diagnosis not present

## 2020-07-12 DIAGNOSIS — I120 Hypertensive chronic kidney disease with stage 5 chronic kidney disease or end stage renal disease: Secondary | ICD-10-CM | POA: Diagnosis not present

## 2020-07-12 DIAGNOSIS — Z48812 Encounter for surgical aftercare following surgery on the circulatory system: Secondary | ICD-10-CM | POA: Diagnosis not present

## 2020-07-12 DIAGNOSIS — Z4801 Encounter for change or removal of surgical wound dressing: Secondary | ICD-10-CM | POA: Diagnosis not present

## 2020-07-12 DIAGNOSIS — Z992 Dependence on renal dialysis: Secondary | ICD-10-CM | POA: Diagnosis not present

## 2020-07-12 DIAGNOSIS — T82590D Other mechanical complication of surgically created arteriovenous fistula, subsequent encounter: Secondary | ICD-10-CM | POA: Diagnosis not present

## 2020-07-12 DIAGNOSIS — N186 End stage renal disease: Secondary | ICD-10-CM | POA: Diagnosis not present

## 2020-07-13 DIAGNOSIS — D259 Leiomyoma of uterus, unspecified: Secondary | ICD-10-CM | POA: Diagnosis not present

## 2020-07-13 DIAGNOSIS — N2581 Secondary hyperparathyroidism of renal origin: Secondary | ICD-10-CM | POA: Diagnosis not present

## 2020-07-13 DIAGNOSIS — L986 Other infiltrative disorders of the skin and subcutaneous tissue: Secondary | ICD-10-CM | POA: Diagnosis not present

## 2020-07-13 DIAGNOSIS — Z48812 Encounter for surgical aftercare following surgery on the circulatory system: Secondary | ICD-10-CM | POA: Diagnosis not present

## 2020-07-13 DIAGNOSIS — D509 Iron deficiency anemia, unspecified: Secondary | ICD-10-CM | POA: Diagnosis not present

## 2020-07-13 DIAGNOSIS — N186 End stage renal disease: Secondary | ICD-10-CM | POA: Diagnosis not present

## 2020-07-13 DIAGNOSIS — Z992 Dependence on renal dialysis: Secondary | ICD-10-CM | POA: Diagnosis not present

## 2020-07-16 DIAGNOSIS — Z992 Dependence on renal dialysis: Secondary | ICD-10-CM | POA: Diagnosis not present

## 2020-07-16 DIAGNOSIS — D259 Leiomyoma of uterus, unspecified: Secondary | ICD-10-CM | POA: Diagnosis not present

## 2020-07-16 DIAGNOSIS — D509 Iron deficiency anemia, unspecified: Secondary | ICD-10-CM | POA: Diagnosis not present

## 2020-07-16 DIAGNOSIS — N2581 Secondary hyperparathyroidism of renal origin: Secondary | ICD-10-CM | POA: Diagnosis not present

## 2020-07-16 DIAGNOSIS — N186 End stage renal disease: Secondary | ICD-10-CM | POA: Diagnosis not present

## 2020-07-16 DIAGNOSIS — L986 Other infiltrative disorders of the skin and subcutaneous tissue: Secondary | ICD-10-CM | POA: Diagnosis not present

## 2020-07-17 ENCOUNTER — Other Ambulatory Visit: Payer: Self-pay

## 2020-07-17 ENCOUNTER — Ambulatory Visit (INDEPENDENT_AMBULATORY_CARE_PROVIDER_SITE_OTHER): Payer: Medicare Other | Admitting: Physician Assistant

## 2020-07-17 VITALS — BP 103/53 | HR 84 | Temp 98.2°F | Resp 20 | Ht 65.0 in | Wt 178.8 lb

## 2020-07-17 DIAGNOSIS — N186 End stage renal disease: Secondary | ICD-10-CM

## 2020-07-17 DIAGNOSIS — Z992 Dependence on renal dialysis: Secondary | ICD-10-CM

## 2020-07-17 NOTE — Progress Notes (Signed)
  POST OPERATIVE DIALYSIS ACCESS OFFICE NOTE    CC:  F/u for dialysis access surgery  HPI:  This is a 74 y.o. female who is s/p left thigh AVG on 06/11/2020 by Dr. Scot Dock.  She notes persistent edema and numbness along graft tunnel.  She is keeping dry gauze in her right groin incision. Dialyzing via left thigh TDC without complications. No fever or chills. No LE pain when walking or awakening her at nighttime.  Dialysis days:  TTS Dialysis center:  [facility]  No Known Allergies  Current Outpatient Medications  Medication Sig Dispense Refill  . acetaminophen (TYLENOL) 500 MG tablet Take 500-1,000 mg by mouth 2 (two) times daily as needed for moderate pain (for back pain).     . cinacalcet (SENSIPAR) 90 MG tablet Take 90 mg by mouth in the morning and at bedtime.     . heparin 1000 unit/mL SOLN injection Heparin Sodium (Porcine) 1,000 Units/mL Catheter Lock Arterial    . ketotifen (ZADITOR) 0.025 % ophthalmic solution Place 1 drop into both eyes daily as needed (for dry eyes).    . midodrine (PROAMATINE) 10 MG tablet Take 10 mg by mouth Every Tuesday,Thursday,and Saturday with dialysis.    Marland Kitchen NONFORMULARY OR COMPOUNDED ITEM Pharmazen compound: Nail Fungus - Complex Nail and Foot Disorders - Fluconazole 1%, DMSO 25%, Fluocinonide 0.05%, Ketoconazole 2%, apply 1-2 pumps to each affected nail beds and feet twice daily. 120 each 11  . Nutritional Supplements (FEEDING SUPPLEMENT, NEPRO CARB STEADY,) LIQD Take 237 mLs by mouth every Tuesday, Thursday, and Saturday at 6 PM.     . oxyCODONE-acetaminophen (PERCOCET) 5-325 MG tablet Take 1 tablet by mouth every 4 (four) hours as needed for severe pain. (Patient not taking: Reported on 07/04/2020) 20 tablet 0  . sevelamer (RENVELA) 800 MG tablet Take 1,600-4,000 mg by mouth See admin instructions. Take 4000 mg  tablets with meals and take 1600 mg tablets with snacks     No current facility-administered medications for this visit.     ROS:  See HPI  LMP  02/16/2012    Physical Exam: Vitals:   07/17/20 1318  BP: (!) 103/53  Pulse: 84  Resp: 20  Temp: 98.2 F (36.8 C)  SpO2: 99%   General appearance:WD, WN in NAD Cardiac:RRR Respiratory:nonlabored Incision:  Right groin incision and counter incision healing without signs of infection Extremities:  Both feet are warm and well perfused. Good bruit in graft. Mild right thigh edema  Assessment/Plan:   -pt does not have evidence of steal syndrome -the fistula/graft may be used starting July 30, 2020 -please notify us when graft is accessed and functioning for several treatments and we can make arrangements to remove Badin, PA-C 07/17/2020 1:12 PM [facility] [phone]  Clinic MD:  Scot Dock

## 2020-07-18 DIAGNOSIS — L986 Other infiltrative disorders of the skin and subcutaneous tissue: Secondary | ICD-10-CM | POA: Diagnosis not present

## 2020-07-18 DIAGNOSIS — D259 Leiomyoma of uterus, unspecified: Secondary | ICD-10-CM | POA: Diagnosis not present

## 2020-07-18 DIAGNOSIS — N186 End stage renal disease: Secondary | ICD-10-CM | POA: Diagnosis not present

## 2020-07-18 DIAGNOSIS — Z992 Dependence on renal dialysis: Secondary | ICD-10-CM | POA: Diagnosis not present

## 2020-07-18 DIAGNOSIS — D509 Iron deficiency anemia, unspecified: Secondary | ICD-10-CM | POA: Diagnosis not present

## 2020-07-18 DIAGNOSIS — N2581 Secondary hyperparathyroidism of renal origin: Secondary | ICD-10-CM | POA: Diagnosis not present

## 2020-07-20 DIAGNOSIS — D509 Iron deficiency anemia, unspecified: Secondary | ICD-10-CM | POA: Diagnosis not present

## 2020-07-20 DIAGNOSIS — D259 Leiomyoma of uterus, unspecified: Secondary | ICD-10-CM | POA: Diagnosis not present

## 2020-07-20 DIAGNOSIS — Z992 Dependence on renal dialysis: Secondary | ICD-10-CM | POA: Diagnosis not present

## 2020-07-20 DIAGNOSIS — L986 Other infiltrative disorders of the skin and subcutaneous tissue: Secondary | ICD-10-CM | POA: Diagnosis not present

## 2020-07-20 DIAGNOSIS — N186 End stage renal disease: Secondary | ICD-10-CM | POA: Diagnosis not present

## 2020-07-20 DIAGNOSIS — N2581 Secondary hyperparathyroidism of renal origin: Secondary | ICD-10-CM | POA: Diagnosis not present

## 2020-07-22 DIAGNOSIS — N186 End stage renal disease: Secondary | ICD-10-CM | POA: Diagnosis not present

## 2020-07-22 DIAGNOSIS — Z992 Dependence on renal dialysis: Secondary | ICD-10-CM | POA: Diagnosis not present

## 2020-07-22 DIAGNOSIS — Q612 Polycystic kidney, adult type: Secondary | ICD-10-CM | POA: Diagnosis not present

## 2020-07-23 DIAGNOSIS — D259 Leiomyoma of uterus, unspecified: Secondary | ICD-10-CM | POA: Diagnosis not present

## 2020-07-23 DIAGNOSIS — Z992 Dependence on renal dialysis: Secondary | ICD-10-CM | POA: Diagnosis not present

## 2020-07-23 DIAGNOSIS — D631 Anemia in chronic kidney disease: Secondary | ICD-10-CM | POA: Diagnosis not present

## 2020-07-23 DIAGNOSIS — D509 Iron deficiency anemia, unspecified: Secondary | ICD-10-CM | POA: Diagnosis not present

## 2020-07-23 DIAGNOSIS — N186 End stage renal disease: Secondary | ICD-10-CM | POA: Diagnosis not present

## 2020-07-23 DIAGNOSIS — N2581 Secondary hyperparathyroidism of renal origin: Secondary | ICD-10-CM | POA: Diagnosis not present

## 2020-07-25 ENCOUNTER — Telehealth: Payer: Self-pay

## 2020-07-25 DIAGNOSIS — N186 End stage renal disease: Secondary | ICD-10-CM | POA: Diagnosis not present

## 2020-07-25 DIAGNOSIS — D259 Leiomyoma of uterus, unspecified: Secondary | ICD-10-CM | POA: Diagnosis not present

## 2020-07-25 DIAGNOSIS — N2581 Secondary hyperparathyroidism of renal origin: Secondary | ICD-10-CM | POA: Diagnosis not present

## 2020-07-25 DIAGNOSIS — Z992 Dependence on renal dialysis: Secondary | ICD-10-CM | POA: Diagnosis not present

## 2020-07-25 DIAGNOSIS — D631 Anemia in chronic kidney disease: Secondary | ICD-10-CM | POA: Diagnosis not present

## 2020-07-25 DIAGNOSIS — D509 Iron deficiency anemia, unspecified: Secondary | ICD-10-CM | POA: Diagnosis not present

## 2020-07-27 DIAGNOSIS — Z992 Dependence on renal dialysis: Secondary | ICD-10-CM | POA: Diagnosis not present

## 2020-07-27 DIAGNOSIS — D631 Anemia in chronic kidney disease: Secondary | ICD-10-CM | POA: Diagnosis not present

## 2020-07-27 DIAGNOSIS — N186 End stage renal disease: Secondary | ICD-10-CM | POA: Diagnosis not present

## 2020-07-27 DIAGNOSIS — N2581 Secondary hyperparathyroidism of renal origin: Secondary | ICD-10-CM | POA: Diagnosis not present

## 2020-07-27 DIAGNOSIS — D259 Leiomyoma of uterus, unspecified: Secondary | ICD-10-CM | POA: Diagnosis not present

## 2020-07-27 DIAGNOSIS — D509 Iron deficiency anemia, unspecified: Secondary | ICD-10-CM | POA: Diagnosis not present

## 2020-07-30 ENCOUNTER — Other Ambulatory Visit: Payer: Self-pay

## 2020-07-30 ENCOUNTER — Encounter (HOSPITAL_BASED_OUTPATIENT_CLINIC_OR_DEPARTMENT_OTHER): Payer: Self-pay | Admitting: *Deleted

## 2020-07-30 ENCOUNTER — Emergency Department (HOSPITAL_BASED_OUTPATIENT_CLINIC_OR_DEPARTMENT_OTHER)
Admission: EM | Admit: 2020-07-30 | Discharge: 2020-07-30 | Disposition: A | Discharge status: Home / Self Care | Payer: Medicare Other | Attending: Emergency Medicine | Admitting: Emergency Medicine

## 2020-07-30 DIAGNOSIS — Z992 Dependence on renal dialysis: Secondary | ICD-10-CM | POA: Insufficient documentation

## 2020-07-30 DIAGNOSIS — D631 Anemia in chronic kidney disease: Secondary | ICD-10-CM | POA: Diagnosis not present

## 2020-07-30 DIAGNOSIS — N2581 Secondary hyperparathyroidism of renal origin: Secondary | ICD-10-CM | POA: Diagnosis not present

## 2020-07-30 DIAGNOSIS — I959 Hypotension, unspecified: Secondary | ICD-10-CM | POA: Diagnosis not present

## 2020-07-30 DIAGNOSIS — N186 End stage renal disease: Secondary | ICD-10-CM | POA: Insufficient documentation

## 2020-07-30 DIAGNOSIS — Z87891 Personal history of nicotine dependence: Secondary | ICD-10-CM | POA: Insufficient documentation

## 2020-07-30 DIAGNOSIS — D509 Iron deficiency anemia, unspecified: Secondary | ICD-10-CM | POA: Diagnosis not present

## 2020-07-30 DIAGNOSIS — D259 Leiomyoma of uterus, unspecified: Secondary | ICD-10-CM | POA: Diagnosis not present

## 2020-07-30 DIAGNOSIS — I120 Hypertensive chronic kidney disease with stage 5 chronic kidney disease or end stage renal disease: Secondary | ICD-10-CM | POA: Insufficient documentation

## 2020-07-30 DIAGNOSIS — T82838A Hemorrhage of vascular prosthetic devices, implants and grafts, initial encounter: Secondary | ICD-10-CM | POA: Diagnosis not present

## 2020-07-30 NOTE — ED Notes (Signed)
ED Provider at bedside. 

## 2020-07-30 NOTE — ED Triage Notes (Signed)
Pt brought in by EMS for a bleeding dialysis shunt in right thigh. Denies bleeding at this time. No other complaints.

## 2020-07-30 NOTE — ED Notes (Addendum)
Pt arrives from home via EMS with c/o fistula bleeding after arriving home . Pt reports dialysis today. Pt denies dizziness, denies shob, endorses b/p usually low. Current b/p 116/40 with no s/s of distress. Pt hx does refect b/p is wnl for pt

## 2020-07-30 NOTE — ED Notes (Signed)
Pt issued cab voucher

## 2020-07-30 NOTE — ED Triage Notes (Signed)
Per EMS:  Pt has dialysis shunt that started to bleed post dialysis today.  EMS arrived, applied small pressure dressing and bleeding is controlled.  VSS

## 2020-07-30 NOTE — ED Provider Notes (Signed)
[place] EMERGENCY DEPARTMENT Provider Note   CSN: [id number] Arrival date & time: 07/30/20  1540     History Chief Complaint  Patient presents with  . Vascular Access Problem    Sue Neal is a 74 y.o. female.  The history is provided by the patient.   Sue Neal is a 74 y.o. female who presents to the Emergency Department complaining of bleeding dialysis shunt. She presents the emergency department by EMS for evaluation after episode of bleeding from her dialysis access site. She has a vas cath in the left groin and a fistula in the right thigh. Today during her dialysis session they used her right thigh access for the first time. She did have a small amount of bleeding during her dialysis session. After she arrived home she reports that she began bleeding from the thigh site for about 10 minutes and EMS was called. Overall she feels well denies any current symptoms. She is not on any blood thinners. She lives at home alone.    Past Medical History:  Diagnosis Date  . Ambulates with cane    occasional uses cane  . Anemia   . Arthritis   . Back pain, chronic   . Blood transfusion without reported diagnosis   . Cataract   . Complication of anesthesia    " I woke up during the procedure."  . ESRD (end stage renal disease) on dialysis ([place])    "TTS; [facility]" (02/07/2016)  . Hyperparathyroidism, secondary ([place])   . Hypertension    Historu - resolved -off meds now  . 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 Uterine fibroid     Patient Active Problem List   Diagnosis Date Noted  . Malfunction of arteriovenous graft ([place])   . Bilateral knee pain 08/03/2019  . Anaphylactic shock, unspecified, sequela 06/30/2019  . Other infiltrative disorders of the skin and subcutaneous tissue 03/27/2019  . Pain, unspecified 01/04/2019  . Diarrhea, unspecified 10/29/2018  .  Decreased ROM of finger 06/09/2017  . Stiffness in joint 06/04/2017  . Rotator cuff injury, left, initial encounter 07/17/2016  . Benign adenomatous polyp of large intestine 05/14/2016  . Diverticulosis of colon without hemorrhage 05/14/2016  . History of colonic polyps 05/14/2016  . Special screening for malignant neoplasms, colon   . ESRD (end stage renal disease) ([place]) 02/07/2016  . Hyperkalemia 01/20/2016  . Trigger finger of left thumb 10/04/2015  . Trigger middle finger of left hand 10/04/2015  . Hypercalcemia 02/15/2014  . Pain in limb 10/31/2013  . Encounter for removal of sutures 08/25/2013  . Shortness of breath 04/28/2013  . Unspecified protein-calorie malnutrition ([place]) 04/19/2013  . Coagulation defect, unspecified ([place]) 03/13/2013  . Dialysis patient ([place]) 09/20/2012  . Carpal tunnel syndrome, unspecified upper limb 08/29/2012  . Numbness of fingers 07/19/2012  . Aftercare following surgery of the circulatory system, [place] 06/21/2012  . Other complications due to other vascular device, implant, and graft 06/21/2012  . End stage renal disease ([place]) 02/04/2012  . Postmenopausal vaginal bleeding 11/24/2011  . Osteoporosis 05/30/2010  . Arthritis 02/10/2010  . Acquired hemolytic anemia, unspecified ([place]) 01/20/2010  . Anemia, chronic disease 01/20/2010  . Benign neoplasm of colon, unspecified 01/20/2010  . Immunization not carried out because of patient refusal 01/20/2010  . Incisional hernia without obstruction or gangrene 01/20/2010  . Iron deficiency anemia, unspecified 01/20/2010  . Leiomyoma of  uterus, unspecified 01/20/2010  . Other dorsalgia 01/20/2010  . Secondary hyperparathyroidism of renal origin ([place]) 01/20/2010  . Spondylosis without myelopathy or radiculopathy, lumbosacral region 01/20/2010  . Thrombocytopenia, unspecified ([place]) 01/20/2010  . Polycystic kidney, adult type 05/06/1999    Past Surgical History:  Procedure Laterality Date  . ARTERIOVENOUS  GRAFT PLACEMENT Right 03/19/2000   upper arm  . ARTERIOVENOUS GRAFT PLACEMENT Left 12/03/2000   thigh  . AV FISTULA PLACEMENT Left 05/11/2020   Procedure: Repair of ulceration left thigh arteriovenous graft;  Surgeon: Angelia Mould, MD;  Location: [facility];  Service: Vascular;  Laterality: Left;  . AV FISTULA PLACEMENT Right 06/11/2020   Procedure: INSERTION OF ARTERIOVENOUS (AV) GORE-TEX GRAFT  RIGHT THIGH;  Surgeon: Angelia Mould, MD;  Location: [facility];  Service: Vascular;  Laterality: Right;  . BIOPSY  09/28/2019   Procedure: BIOPSY;  Surgeon: Milus Banister, MD;  Location: [facility];  Service: Endoscopy;;  . CARPAL TUNNEL RELEASE Left 03/10/2013   Procedure: CARPAL TUNNEL RELEASE;  Surgeon: Wynonia Sours, MD;  Location: [facility];  Service: Orthopedics;  Laterality: Left;  . CARPAL TUNNEL RELEASE Right 03/28/2015   Procedure: RIGHT CARPAL TUNNEL RELEASE;  Surgeon: Daryll Brod, MD;  Location: [facility];  Service: Orthopedics;  Laterality: Right;  ANESTHESIA:  IV REGIONAL FAB  . COLONOSCOPY W/ BIOPSIES    . COLONOSCOPY WITH PROPOFOL N/A 05/14/2016   Procedure: COLONOSCOPY WITH PROPOFOL;  Surgeon: Milus Banister, MD;  Location: [facility];  Service: Endoscopy;  Laterality: N/A;  . COLONOSCOPY WITH PROPOFOL N/A 09/28/2019   Procedure: COLONOSCOPY WITH PROPOFOL;  Surgeon: Milus Banister, MD;  Location: [facility];  Service: Endoscopy;  Laterality: N/A;  HARD STICK-DIALYSIS PATIENT  . HEMICOLECTOMY  02/1996  . HERNIA REPAIR     laparoscopic repair during a [place] hospitalization from 03/26/2006-03/30/2006  . INCISIONAL HERNIA REPAIR  04/15/2006   laparoscopic  . INSERTION OF DIALYSIS CATHETER Left 06/06/2000   IJ [place] catheter  . INSERTION OF DIALYSIS CATHETER Right 06/28/2000   IJ Ash catheter  . INSERTION OF DIALYSIS CATHETER Left 08/13/2000   subclavian Ash catheter  . INSERTION OF DIALYSIS CATHETER Left 05/11/2020   Procedure:  Ultrasound-guided access to the right internal jugular vein with venogram right IJ and azygous vein 2.  Placement of left femoral 55 cm tunneled dialysis catheter under ultrasound guidance ;  Surgeon: Angelia Mould, MD;  Location: [facility] OR;  Service: Vascular;  Laterality: Left;  . multiple failed grafts     left thigh AVG 12/03/00, clotted -05/31/03, 01/24/04, 08/28/04, 09/06/04( thrombectomy and revision )Left AVG declot procedure including complete AV shuntogram, 08/29/04 left AV thrombolysis and angioplasty 2012 shunto gram to left thigh AVG  . POLYPECTOMY  09/28/2019   Procedure: POLYPECTOMY;  Surgeon: Milus Banister, MD;  Location: [facility];  Service: Endoscopy;;  . REMOVAL OF A DIALYSIS CATHETER  05/31/2000   Schon catheter  . REVISION OF ARTERIOVENOUS GORETEX GRAFT Left 06/23/2012   thigh; with exc. pseudoaneurysm  . REVISION OF ARTERIOVENOUS GORETEX GRAFT Left 02/07/2016   Procedure: REVISION OF LEFT THIGH ARTERIOVENOUS GORETEX GRAFT;  Surgeon: Angelia Mould, MD;  Location: [facility];  Service: Vascular;  Laterality: Left;  . SHUNTOGRAM Left 02/16/2012   thigh; with angioplasty, venous anastomosis; stent, medial graft pseudoaneurysm  . SHUNTOGRAM N/A 02/16/2012   Procedure: Earney Mallet;  Surgeon: Serafina Mitchell, MD;  Location: [facility] CATH LAB;  Service: Cardiovascular;  Laterality: N/A;  . THROMBECTOMY /  ARTERIOVENOUS GRAFT REVISION Left 02/07/2016   thigh  . THROMBECTOMY AND REVISION OF ARTERIOVENTOUS (AV) GORETEX  GRAFT Right 06/04/2000   upper arm  . THROMBECTOMY AND REVISION OF ARTERIOVENTOUS (AV) GORETEX  GRAFT Left 09/06/2004   thigh  . TRIGGER FINGER RELEASE Right 03/28/2015   Procedure: RELEASE A-1 PULLEY RIGHT THUMB;  Surgeon: Cindee Salt, MD;  Location:  SURGERY CENTER;  Service: Orthopedics;  Laterality: Right;     OB History   No obstetric history on file.     Family History  Problem Relation Age of Onset  . Colon cancer Mother 30  . Diabetes Cousin   .  Breast cancer Maternal Aunt   . Breast cancer Cousin     Social History   Tobacco Use  . Smoking status: Former Smoker    Packs/day: 0.10    Types: Cigarettes    Quit date: 02/03/1978    Years since quitting: 42.5  . Smokeless tobacco: Never Used  Vaping Use  . Vaping Use: Never used  Substance Use Topics  . Alcohol use: Yes    Alcohol/week: 0.0 standard drinks    Comment: rare-once a year  . Drug use: No    Home Medications Prior to Admission medications   Medication Sig Start Date End Date Taking? Authorizing Provider  acetaminophen (TYLENOL) 500 MG tablet Take 500-1,000 mg by mouth 2 (two) times daily as needed for moderate pain (for back pain).     [provider]  cinacalcet (SENSIPAR) 90 MG tablet Take 90 mg by mouth in the morning and at bedtime.     [provider]  heparin 1000 unit/mL SOLN injection Heparin Sodium (Porcine) 1,000 Units/mL Catheter Lock Arterial 05/16/20 05/15/21  [provider]  ketotifen (ZADITOR) 0.025 % ophthalmic solution Place 1 drop into both eyes daily as needed (for dry eyes).    [provider]  midodrine (PROAMATINE) 10 MG tablet Take 10 mg by mouth Every Tuesday,Thursday,and Saturday with dialysis. 02/06/20   [provider]  NONFORMULARY OR COMPOUNDED ITEM Pharmazen compound: Nail Fungus - Complex Nail and Foot Disorders - Fluconazole 1%, DMSO 25%, Fluocinonide 0.05%, Ketoconazole 2%, apply 1-2 pumps to each affected nail beds and feet twice daily. 02/14/16   Vivi Barrack, DPM  Nutritional Supplements (FEEDING SUPPLEMENT, NEPRO CARB STEADY,) LIQD Take 237 mLs by mouth every Tuesday, Thursday, and Saturday at 6 PM.     [provider]  oxyCODONE-acetaminophen (PERCOCET) 5-325 MG tablet Take 1 tablet by mouth every 4 (four) hours as needed for severe pain. 06/11/20 06/11/21  Baglia, Corrina, PA-C  sevelamer (RENVELA) 800 MG tablet Take 1,600-4,000 mg by mouth See admin instructions. Take 4000  mg  tablets with meals and take 1600 mg tablets with snacks    [provider]    Allergies    Patient has no known allergies.  Review of Systems   Review of Systems  All other systems reviewed and are negative.   Physical Exam Updated Vital Signs BP (!) 116/40 (BP Location: Left Arm)   Pulse 89   Temp 98.2 F (36.8 C) (Oral)   Resp 20   Ht 5\' 5"  (1.651 m)   Wt 82.6 kg   LMP 02/16/2012   SpO2 97%   BMI 30.29 kg/m   Physical Exam Vitals and nursing note reviewed.  Constitutional:      Appearance: She is well-developed.  HENT:     Head: Normocephalic and atraumatic.  Cardiovascular:     Rate and  Rhythm: Normal rate and regular rhythm.  Pulmonary:     Effort: Pulmonary effort is normal. No respiratory distress.  Abdominal:     Palpations: Abdomen is soft.     Tenderness: There is no abdominal tenderness. There is no guarding or rebound.  Musculoskeletal:        General: No tenderness.     Comments: There is a vast cath in the left groin that is clean, dry, intact. A pressure dressing was removed from the right thigh with a very small amount of dried blood on the bandage. No evidence of active bleeding of the thigh. There are two areas of induration without tenderness to the right thigh.  Skin:    General: Skin is warm and dry.  Neurological:     Mental Status: She is alert and oriented to person, place, and time.  Psychiatric:        Behavior: Behavior normal.     ED Results / Procedures / Treatments   Labs (all labs ordered are listed, but only abnormal results are displayed) Labs Reviewed - No data to display  EKG None  Radiology No results found.  Procedures Procedures (including critical care time)  Medications Ordered in ED Medications - No data to display  ED Course  I have reviewed the triage vital signs and the nursing notes.  Pertinent labs & imaging results that were available during my care of the patient were reviewed by me and  considered in my medical decision making (see chart for details).    MDM Rules/Calculators/A&P                         Patient with ESR D on hemodialysis here for evaluation following episode of bleeding from her dialysis site. Patient had a dressing in place and was observed in the emergency department for several hours without any recurrent bleeding. A new dressing was placed. Discussed with patient home care for bleeding if she were to re-bleed. Plan to discharge home with outpatient follow-up and return precautions.  Final Clinical Impression(s) / ED Diagnoses Final diagnoses:  Bleeding from dialysis shunt, initial encounter [facility])    Rx / DC Orders ED Discharge Orders    None       Quintella Reichert, MD 07/30/20 1912

## 2020-08-02 NOTE — Telephone Encounter (Signed)
Encounter opened in error

## 2020-08-03 DIAGNOSIS — N186 End stage renal disease: Secondary | ICD-10-CM | POA: Diagnosis not present

## 2020-08-03 DIAGNOSIS — D509 Iron deficiency anemia, unspecified: Secondary | ICD-10-CM | POA: Diagnosis not present

## 2020-08-03 DIAGNOSIS — N2581 Secondary hyperparathyroidism of renal origin: Secondary | ICD-10-CM | POA: Diagnosis not present

## 2020-08-03 DIAGNOSIS — Z992 Dependence on renal dialysis: Secondary | ICD-10-CM | POA: Diagnosis not present

## 2020-08-03 DIAGNOSIS — D259 Leiomyoma of uterus, unspecified: Secondary | ICD-10-CM | POA: Diagnosis not present

## 2020-08-03 DIAGNOSIS — D631 Anemia in chronic kidney disease: Secondary | ICD-10-CM | POA: Diagnosis not present

## 2020-08-06 DIAGNOSIS — N2581 Secondary hyperparathyroidism of renal origin: Secondary | ICD-10-CM | POA: Diagnosis not present

## 2020-08-06 DIAGNOSIS — N186 End stage renal disease: Secondary | ICD-10-CM | POA: Diagnosis not present

## 2020-08-06 DIAGNOSIS — D259 Leiomyoma of uterus, unspecified: Secondary | ICD-10-CM | POA: Diagnosis not present

## 2020-08-06 DIAGNOSIS — D509 Iron deficiency anemia, unspecified: Secondary | ICD-10-CM | POA: Diagnosis not present

## 2020-08-06 DIAGNOSIS — Z992 Dependence on renal dialysis: Secondary | ICD-10-CM | POA: Diagnosis not present

## 2020-08-06 DIAGNOSIS — D631 Anemia in chronic kidney disease: Secondary | ICD-10-CM | POA: Diagnosis not present

## 2020-08-10 DIAGNOSIS — Z992 Dependence on renal dialysis: Secondary | ICD-10-CM | POA: Diagnosis not present

## 2020-08-10 DIAGNOSIS — D631 Anemia in chronic kidney disease: Secondary | ICD-10-CM | POA: Diagnosis not present

## 2020-08-10 DIAGNOSIS — D259 Leiomyoma of uterus, unspecified: Secondary | ICD-10-CM | POA: Diagnosis not present

## 2020-08-10 DIAGNOSIS — N2581 Secondary hyperparathyroidism of renal origin: Secondary | ICD-10-CM | POA: Diagnosis not present

## 2020-08-10 DIAGNOSIS — N186 End stage renal disease: Secondary | ICD-10-CM | POA: Diagnosis not present

## 2020-08-10 DIAGNOSIS — D509 Iron deficiency anemia, unspecified: Secondary | ICD-10-CM | POA: Diagnosis not present

## 2020-08-12 DIAGNOSIS — D259 Leiomyoma of uterus, unspecified: Secondary | ICD-10-CM | POA: Diagnosis not present

## 2020-08-12 DIAGNOSIS — N2581 Secondary hyperparathyroidism of renal origin: Secondary | ICD-10-CM | POA: Diagnosis not present

## 2020-08-12 DIAGNOSIS — D509 Iron deficiency anemia, unspecified: Secondary | ICD-10-CM | POA: Diagnosis not present

## 2020-08-12 DIAGNOSIS — D631 Anemia in chronic kidney disease: Secondary | ICD-10-CM | POA: Diagnosis not present

## 2020-08-12 DIAGNOSIS — N186 End stage renal disease: Secondary | ICD-10-CM | POA: Diagnosis not present

## 2020-08-12 DIAGNOSIS — Z992 Dependence on renal dialysis: Secondary | ICD-10-CM | POA: Diagnosis not present

## 2020-08-14 DIAGNOSIS — D631 Anemia in chronic kidney disease: Secondary | ICD-10-CM | POA: Diagnosis not present

## 2020-08-14 DIAGNOSIS — D509 Iron deficiency anemia, unspecified: Secondary | ICD-10-CM | POA: Diagnosis not present

## 2020-08-14 DIAGNOSIS — Z992 Dependence on renal dialysis: Secondary | ICD-10-CM | POA: Diagnosis not present

## 2020-08-14 DIAGNOSIS — D259 Leiomyoma of uterus, unspecified: Secondary | ICD-10-CM | POA: Diagnosis not present

## 2020-08-14 DIAGNOSIS — N2581 Secondary hyperparathyroidism of renal origin: Secondary | ICD-10-CM | POA: Diagnosis not present

## 2020-08-14 DIAGNOSIS — N186 End stage renal disease: Secondary | ICD-10-CM | POA: Diagnosis not present

## 2020-08-17 DIAGNOSIS — N2581 Secondary hyperparathyroidism of renal origin: Secondary | ICD-10-CM | POA: Diagnosis not present

## 2020-08-17 DIAGNOSIS — Z992 Dependence on renal dialysis: Secondary | ICD-10-CM | POA: Diagnosis not present

## 2020-08-17 DIAGNOSIS — D509 Iron deficiency anemia, unspecified: Secondary | ICD-10-CM | POA: Diagnosis not present

## 2020-08-17 DIAGNOSIS — D631 Anemia in chronic kidney disease: Secondary | ICD-10-CM | POA: Diagnosis not present

## 2020-08-17 DIAGNOSIS — D259 Leiomyoma of uterus, unspecified: Secondary | ICD-10-CM | POA: Diagnosis not present

## 2020-08-17 DIAGNOSIS — N186 End stage renal disease: Secondary | ICD-10-CM | POA: Diagnosis not present

## 2020-08-20 DIAGNOSIS — D631 Anemia in chronic kidney disease: Secondary | ICD-10-CM | POA: Diagnosis not present

## 2020-08-20 DIAGNOSIS — D509 Iron deficiency anemia, unspecified: Secondary | ICD-10-CM | POA: Diagnosis not present

## 2020-08-20 DIAGNOSIS — D259 Leiomyoma of uterus, unspecified: Secondary | ICD-10-CM | POA: Diagnosis not present

## 2020-08-20 DIAGNOSIS — N186 End stage renal disease: Secondary | ICD-10-CM | POA: Diagnosis not present

## 2020-08-20 DIAGNOSIS — Z992 Dependence on renal dialysis: Secondary | ICD-10-CM | POA: Diagnosis not present

## 2020-08-20 DIAGNOSIS — N2581 Secondary hyperparathyroidism of renal origin: Secondary | ICD-10-CM | POA: Diagnosis not present

## 2020-08-21 DIAGNOSIS — Q612 Polycystic kidney, adult type: Secondary | ICD-10-CM | POA: Diagnosis not present

## 2020-08-21 DIAGNOSIS — N186 End stage renal disease: Secondary | ICD-10-CM | POA: Diagnosis not present

## 2020-08-21 DIAGNOSIS — Z992 Dependence on renal dialysis: Secondary | ICD-10-CM | POA: Diagnosis not present

## 2020-08-22 DIAGNOSIS — N2581 Secondary hyperparathyroidism of renal origin: Secondary | ICD-10-CM | POA: Diagnosis not present

## 2020-08-22 DIAGNOSIS — N186 End stage renal disease: Secondary | ICD-10-CM | POA: Diagnosis not present

## 2020-08-22 DIAGNOSIS — Z992 Dependence on renal dialysis: Secondary | ICD-10-CM | POA: Diagnosis not present

## 2020-08-22 DIAGNOSIS — D259 Leiomyoma of uterus, unspecified: Secondary | ICD-10-CM | POA: Diagnosis not present

## 2020-08-22 DIAGNOSIS — D631 Anemia in chronic kidney disease: Secondary | ICD-10-CM | POA: Diagnosis not present

## 2020-08-22 DIAGNOSIS — D509 Iron deficiency anemia, unspecified: Secondary | ICD-10-CM | POA: Diagnosis not present

## 2020-08-24 DIAGNOSIS — D509 Iron deficiency anemia, unspecified: Secondary | ICD-10-CM | POA: Diagnosis not present

## 2020-08-24 DIAGNOSIS — D259 Leiomyoma of uterus, unspecified: Secondary | ICD-10-CM | POA: Diagnosis not present

## 2020-08-24 DIAGNOSIS — Z992 Dependence on renal dialysis: Secondary | ICD-10-CM | POA: Diagnosis not present

## 2020-08-24 DIAGNOSIS — N186 End stage renal disease: Secondary | ICD-10-CM | POA: Diagnosis not present

## 2020-08-24 DIAGNOSIS — D631 Anemia in chronic kidney disease: Secondary | ICD-10-CM | POA: Diagnosis not present

## 2020-08-24 DIAGNOSIS — N2581 Secondary hyperparathyroidism of renal origin: Secondary | ICD-10-CM | POA: Diagnosis not present

## 2020-08-27 DIAGNOSIS — D509 Iron deficiency anemia, unspecified: Secondary | ICD-10-CM | POA: Diagnosis not present

## 2020-08-27 DIAGNOSIS — N186 End stage renal disease: Secondary | ICD-10-CM | POA: Diagnosis not present

## 2020-08-27 DIAGNOSIS — N2581 Secondary hyperparathyroidism of renal origin: Secondary | ICD-10-CM | POA: Diagnosis not present

## 2020-08-27 DIAGNOSIS — D259 Leiomyoma of uterus, unspecified: Secondary | ICD-10-CM | POA: Diagnosis not present

## 2020-08-27 DIAGNOSIS — D631 Anemia in chronic kidney disease: Secondary | ICD-10-CM | POA: Diagnosis not present

## 2020-08-27 DIAGNOSIS — Z992 Dependence on renal dialysis: Secondary | ICD-10-CM | POA: Diagnosis not present

## 2020-08-28 DIAGNOSIS — Z452 Encounter for adjustment and management of vascular access device: Secondary | ICD-10-CM | POA: Diagnosis not present

## 2020-08-31 DIAGNOSIS — D631 Anemia in chronic kidney disease: Secondary | ICD-10-CM | POA: Diagnosis not present

## 2020-08-31 DIAGNOSIS — D509 Iron deficiency anemia, unspecified: Secondary | ICD-10-CM | POA: Diagnosis not present

## 2020-08-31 DIAGNOSIS — N2581 Secondary hyperparathyroidism of renal origin: Secondary | ICD-10-CM | POA: Diagnosis not present

## 2020-08-31 DIAGNOSIS — D259 Leiomyoma of uterus, unspecified: Secondary | ICD-10-CM | POA: Diagnosis not present

## 2020-08-31 DIAGNOSIS — Z992 Dependence on renal dialysis: Secondary | ICD-10-CM | POA: Diagnosis not present

## 2020-08-31 DIAGNOSIS — N186 End stage renal disease: Secondary | ICD-10-CM | POA: Diagnosis not present

## 2020-09-03 DIAGNOSIS — N186 End stage renal disease: Secondary | ICD-10-CM | POA: Diagnosis not present

## 2020-09-03 DIAGNOSIS — D631 Anemia in chronic kidney disease: Secondary | ICD-10-CM | POA: Diagnosis not present

## 2020-09-03 DIAGNOSIS — D509 Iron deficiency anemia, unspecified: Secondary | ICD-10-CM | POA: Diagnosis not present

## 2020-09-03 DIAGNOSIS — D259 Leiomyoma of uterus, unspecified: Secondary | ICD-10-CM | POA: Diagnosis not present

## 2020-09-03 DIAGNOSIS — N2581 Secondary hyperparathyroidism of renal origin: Secondary | ICD-10-CM | POA: Diagnosis not present

## 2020-09-03 DIAGNOSIS — Z992 Dependence on renal dialysis: Secondary | ICD-10-CM | POA: Diagnosis not present

## 2020-09-05 DIAGNOSIS — D259 Leiomyoma of uterus, unspecified: Secondary | ICD-10-CM | POA: Diagnosis not present

## 2020-09-05 DIAGNOSIS — N2581 Secondary hyperparathyroidism of renal origin: Secondary | ICD-10-CM | POA: Diagnosis not present

## 2020-09-05 DIAGNOSIS — D509 Iron deficiency anemia, unspecified: Secondary | ICD-10-CM | POA: Diagnosis not present

## 2020-09-05 DIAGNOSIS — Z992 Dependence on renal dialysis: Secondary | ICD-10-CM | POA: Diagnosis not present

## 2020-09-05 DIAGNOSIS — N186 End stage renal disease: Secondary | ICD-10-CM | POA: Diagnosis not present

## 2020-09-05 DIAGNOSIS — D631 Anemia in chronic kidney disease: Secondary | ICD-10-CM | POA: Diagnosis not present

## 2020-09-07 DIAGNOSIS — D631 Anemia in chronic kidney disease: Secondary | ICD-10-CM | POA: Diagnosis not present

## 2020-09-07 DIAGNOSIS — N186 End stage renal disease: Secondary | ICD-10-CM | POA: Diagnosis not present

## 2020-09-07 DIAGNOSIS — D509 Iron deficiency anemia, unspecified: Secondary | ICD-10-CM | POA: Diagnosis not present

## 2020-09-07 DIAGNOSIS — N2581 Secondary hyperparathyroidism of renal origin: Secondary | ICD-10-CM | POA: Diagnosis not present

## 2020-09-07 DIAGNOSIS — Z992 Dependence on renal dialysis: Secondary | ICD-10-CM | POA: Diagnosis not present

## 2020-09-07 DIAGNOSIS — D259 Leiomyoma of uterus, unspecified: Secondary | ICD-10-CM | POA: Diagnosis not present

## 2020-09-10 DIAGNOSIS — N2581 Secondary hyperparathyroidism of renal origin: Secondary | ICD-10-CM | POA: Diagnosis not present

## 2020-09-10 DIAGNOSIS — D631 Anemia in chronic kidney disease: Secondary | ICD-10-CM | POA: Diagnosis not present

## 2020-09-10 DIAGNOSIS — D259 Leiomyoma of uterus, unspecified: Secondary | ICD-10-CM | POA: Diagnosis not present

## 2020-09-10 DIAGNOSIS — D509 Iron deficiency anemia, unspecified: Secondary | ICD-10-CM | POA: Diagnosis not present

## 2020-09-10 DIAGNOSIS — Z992 Dependence on renal dialysis: Secondary | ICD-10-CM | POA: Diagnosis not present

## 2020-09-10 DIAGNOSIS — N186 End stage renal disease: Secondary | ICD-10-CM | POA: Diagnosis not present

## 2020-09-12 DIAGNOSIS — D259 Leiomyoma of uterus, unspecified: Secondary | ICD-10-CM | POA: Diagnosis not present

## 2020-09-12 DIAGNOSIS — N186 End stage renal disease: Secondary | ICD-10-CM | POA: Diagnosis not present

## 2020-09-12 DIAGNOSIS — Z992 Dependence on renal dialysis: Secondary | ICD-10-CM | POA: Diagnosis not present

## 2020-09-12 DIAGNOSIS — D509 Iron deficiency anemia, unspecified: Secondary | ICD-10-CM | POA: Diagnosis not present

## 2020-09-12 DIAGNOSIS — N2581 Secondary hyperparathyroidism of renal origin: Secondary | ICD-10-CM | POA: Diagnosis not present

## 2020-09-12 DIAGNOSIS — D631 Anemia in chronic kidney disease: Secondary | ICD-10-CM | POA: Diagnosis not present

## 2020-09-15 DIAGNOSIS — D259 Leiomyoma of uterus, unspecified: Secondary | ICD-10-CM | POA: Diagnosis not present

## 2020-09-15 DIAGNOSIS — N186 End stage renal disease: Secondary | ICD-10-CM | POA: Diagnosis not present

## 2020-09-15 DIAGNOSIS — D631 Anemia in chronic kidney disease: Secondary | ICD-10-CM | POA: Diagnosis not present

## 2020-09-15 DIAGNOSIS — N2581 Secondary hyperparathyroidism of renal origin: Secondary | ICD-10-CM | POA: Diagnosis not present

## 2020-09-15 DIAGNOSIS — D509 Iron deficiency anemia, unspecified: Secondary | ICD-10-CM | POA: Diagnosis not present

## 2020-09-15 DIAGNOSIS — Z992 Dependence on renal dialysis: Secondary | ICD-10-CM | POA: Diagnosis not present

## 2020-09-19 DIAGNOSIS — N186 End stage renal disease: Secondary | ICD-10-CM | POA: Diagnosis not present

## 2020-09-19 DIAGNOSIS — N2581 Secondary hyperparathyroidism of renal origin: Secondary | ICD-10-CM | POA: Diagnosis not present

## 2020-09-19 DIAGNOSIS — D259 Leiomyoma of uterus, unspecified: Secondary | ICD-10-CM | POA: Diagnosis not present

## 2020-09-19 DIAGNOSIS — Z992 Dependence on renal dialysis: Secondary | ICD-10-CM | POA: Diagnosis not present

## 2020-09-19 DIAGNOSIS — D631 Anemia in chronic kidney disease: Secondary | ICD-10-CM | POA: Diagnosis not present

## 2020-09-19 DIAGNOSIS — D509 Iron deficiency anemia, unspecified: Secondary | ICD-10-CM | POA: Diagnosis not present

## 2020-09-21 DIAGNOSIS — N186 End stage renal disease: Secondary | ICD-10-CM | POA: Diagnosis not present

## 2020-09-21 DIAGNOSIS — Q612 Polycystic kidney, adult type: Secondary | ICD-10-CM | POA: Diagnosis not present

## 2020-09-21 DIAGNOSIS — Z992 Dependence on renal dialysis: Secondary | ICD-10-CM | POA: Diagnosis not present

## 2020-09-22 DIAGNOSIS — D259 Leiomyoma of uterus, unspecified: Secondary | ICD-10-CM | POA: Diagnosis not present

## 2020-09-22 DIAGNOSIS — D631 Anemia in chronic kidney disease: Secondary | ICD-10-CM | POA: Diagnosis not present

## 2020-09-22 DIAGNOSIS — D509 Iron deficiency anemia, unspecified: Secondary | ICD-10-CM | POA: Diagnosis not present

## 2020-09-22 DIAGNOSIS — N2581 Secondary hyperparathyroidism of renal origin: Secondary | ICD-10-CM | POA: Diagnosis not present

## 2020-09-22 DIAGNOSIS — N186 End stage renal disease: Secondary | ICD-10-CM | POA: Diagnosis not present

## 2020-09-22 DIAGNOSIS — Z992 Dependence on renal dialysis: Secondary | ICD-10-CM | POA: Diagnosis not present

## 2020-09-26 DIAGNOSIS — D259 Leiomyoma of uterus, unspecified: Secondary | ICD-10-CM | POA: Diagnosis not present

## 2020-09-26 DIAGNOSIS — N186 End stage renal disease: Secondary | ICD-10-CM | POA: Diagnosis not present

## 2020-09-26 DIAGNOSIS — D631 Anemia in chronic kidney disease: Secondary | ICD-10-CM | POA: Diagnosis not present

## 2020-09-26 DIAGNOSIS — D509 Iron deficiency anemia, unspecified: Secondary | ICD-10-CM | POA: Diagnosis not present

## 2020-09-26 DIAGNOSIS — Z992 Dependence on renal dialysis: Secondary | ICD-10-CM | POA: Diagnosis not present

## 2020-09-26 DIAGNOSIS — N2581 Secondary hyperparathyroidism of renal origin: Secondary | ICD-10-CM | POA: Diagnosis not present

## 2020-09-28 DIAGNOSIS — D259 Leiomyoma of uterus, unspecified: Secondary | ICD-10-CM | POA: Diagnosis not present

## 2020-09-28 DIAGNOSIS — D509 Iron deficiency anemia, unspecified: Secondary | ICD-10-CM | POA: Diagnosis not present

## 2020-09-28 DIAGNOSIS — D631 Anemia in chronic kidney disease: Secondary | ICD-10-CM | POA: Diagnosis not present

## 2020-09-28 DIAGNOSIS — N2581 Secondary hyperparathyroidism of renal origin: Secondary | ICD-10-CM | POA: Diagnosis not present

## 2020-09-28 DIAGNOSIS — N186 End stage renal disease: Secondary | ICD-10-CM | POA: Diagnosis not present

## 2020-09-28 DIAGNOSIS — Z992 Dependence on renal dialysis: Secondary | ICD-10-CM | POA: Diagnosis not present

## 2020-10-01 DIAGNOSIS — D509 Iron deficiency anemia, unspecified: Secondary | ICD-10-CM | POA: Diagnosis not present

## 2020-10-01 DIAGNOSIS — Z992 Dependence on renal dialysis: Secondary | ICD-10-CM | POA: Diagnosis not present

## 2020-10-01 DIAGNOSIS — N2581 Secondary hyperparathyroidism of renal origin: Secondary | ICD-10-CM | POA: Diagnosis not present

## 2020-10-01 DIAGNOSIS — N186 End stage renal disease: Secondary | ICD-10-CM | POA: Diagnosis not present

## 2020-10-01 DIAGNOSIS — D259 Leiomyoma of uterus, unspecified: Secondary | ICD-10-CM | POA: Diagnosis not present

## 2020-10-01 DIAGNOSIS — D631 Anemia in chronic kidney disease: Secondary | ICD-10-CM | POA: Diagnosis not present

## 2020-10-03 DIAGNOSIS — D259 Leiomyoma of uterus, unspecified: Secondary | ICD-10-CM | POA: Diagnosis not present

## 2020-10-03 DIAGNOSIS — N2581 Secondary hyperparathyroidism of renal origin: Secondary | ICD-10-CM | POA: Diagnosis not present

## 2020-10-03 DIAGNOSIS — D631 Anemia in chronic kidney disease: Secondary | ICD-10-CM | POA: Diagnosis not present

## 2020-10-03 DIAGNOSIS — Z992 Dependence on renal dialysis: Secondary | ICD-10-CM | POA: Diagnosis not present

## 2020-10-03 DIAGNOSIS — D509 Iron deficiency anemia, unspecified: Secondary | ICD-10-CM | POA: Diagnosis not present

## 2020-10-03 DIAGNOSIS — N186 End stage renal disease: Secondary | ICD-10-CM | POA: Diagnosis not present

## 2020-10-05 DIAGNOSIS — D631 Anemia in chronic kidney disease: Secondary | ICD-10-CM | POA: Diagnosis not present

## 2020-10-05 DIAGNOSIS — Z992 Dependence on renal dialysis: Secondary | ICD-10-CM | POA: Diagnosis not present

## 2020-10-05 DIAGNOSIS — D259 Leiomyoma of uterus, unspecified: Secondary | ICD-10-CM | POA: Diagnosis not present

## 2020-10-05 DIAGNOSIS — N2581 Secondary hyperparathyroidism of renal origin: Secondary | ICD-10-CM | POA: Diagnosis not present

## 2020-10-05 DIAGNOSIS — N186 End stage renal disease: Secondary | ICD-10-CM | POA: Diagnosis not present

## 2020-10-05 DIAGNOSIS — D509 Iron deficiency anemia, unspecified: Secondary | ICD-10-CM | POA: Diagnosis not present

## 2020-10-10 DIAGNOSIS — D509 Iron deficiency anemia, unspecified: Secondary | ICD-10-CM | POA: Diagnosis not present

## 2020-10-10 DIAGNOSIS — N2581 Secondary hyperparathyroidism of renal origin: Secondary | ICD-10-CM | POA: Diagnosis not present

## 2020-10-10 DIAGNOSIS — D631 Anemia in chronic kidney disease: Secondary | ICD-10-CM | POA: Diagnosis not present

## 2020-10-10 DIAGNOSIS — N186 End stage renal disease: Secondary | ICD-10-CM | POA: Diagnosis not present

## 2020-10-10 DIAGNOSIS — D259 Leiomyoma of uterus, unspecified: Secondary | ICD-10-CM | POA: Diagnosis not present

## 2020-10-10 DIAGNOSIS — Z992 Dependence on renal dialysis: Secondary | ICD-10-CM | POA: Diagnosis not present

## 2020-10-15 DIAGNOSIS — D631 Anemia in chronic kidney disease: Secondary | ICD-10-CM | POA: Diagnosis not present

## 2020-10-15 DIAGNOSIS — N186 End stage renal disease: Secondary | ICD-10-CM | POA: Diagnosis not present

## 2020-10-15 DIAGNOSIS — N2581 Secondary hyperparathyroidism of renal origin: Secondary | ICD-10-CM | POA: Diagnosis not present

## 2020-10-15 DIAGNOSIS — D509 Iron deficiency anemia, unspecified: Secondary | ICD-10-CM | POA: Diagnosis not present

## 2020-10-15 DIAGNOSIS — D259 Leiomyoma of uterus, unspecified: Secondary | ICD-10-CM | POA: Diagnosis not present

## 2020-10-15 DIAGNOSIS — Z992 Dependence on renal dialysis: Secondary | ICD-10-CM | POA: Diagnosis not present

## 2020-10-17 DIAGNOSIS — N186 End stage renal disease: Secondary | ICD-10-CM | POA: Diagnosis not present

## 2020-10-17 DIAGNOSIS — D509 Iron deficiency anemia, unspecified: Secondary | ICD-10-CM | POA: Diagnosis not present

## 2020-10-17 DIAGNOSIS — D259 Leiomyoma of uterus, unspecified: Secondary | ICD-10-CM | POA: Diagnosis not present

## 2020-10-17 DIAGNOSIS — Z992 Dependence on renal dialysis: Secondary | ICD-10-CM | POA: Diagnosis not present

## 2020-10-17 DIAGNOSIS — D631 Anemia in chronic kidney disease: Secondary | ICD-10-CM | POA: Diagnosis not present

## 2020-10-17 DIAGNOSIS — N2581 Secondary hyperparathyroidism of renal origin: Secondary | ICD-10-CM | POA: Diagnosis not present

## 2020-10-22 DIAGNOSIS — D631 Anemia in chronic kidney disease: Secondary | ICD-10-CM | POA: Diagnosis not present

## 2020-10-22 DIAGNOSIS — N2581 Secondary hyperparathyroidism of renal origin: Secondary | ICD-10-CM | POA: Diagnosis not present

## 2020-10-22 DIAGNOSIS — Q612 Polycystic kidney, adult type: Secondary | ICD-10-CM | POA: Diagnosis not present

## 2020-10-22 DIAGNOSIS — Z992 Dependence on renal dialysis: Secondary | ICD-10-CM | POA: Diagnosis not present

## 2020-10-22 DIAGNOSIS — D509 Iron deficiency anemia, unspecified: Secondary | ICD-10-CM | POA: Diagnosis not present

## 2020-10-22 DIAGNOSIS — D259 Leiomyoma of uterus, unspecified: Secondary | ICD-10-CM | POA: Diagnosis not present

## 2020-10-22 DIAGNOSIS — N186 End stage renal disease: Secondary | ICD-10-CM | POA: Diagnosis not present

## 2020-10-23 DIAGNOSIS — T82898A Other specified complication of vascular prosthetic devices, implants and grafts, initial encounter: Secondary | ICD-10-CM | POA: Diagnosis not present

## 2020-10-23 DIAGNOSIS — N186 End stage renal disease: Secondary | ICD-10-CM | POA: Diagnosis not present

## 2020-10-23 DIAGNOSIS — Z992 Dependence on renal dialysis: Secondary | ICD-10-CM | POA: Diagnosis not present

## 2020-10-24 DIAGNOSIS — D509 Iron deficiency anemia, unspecified: Secondary | ICD-10-CM | POA: Diagnosis not present

## 2020-10-24 DIAGNOSIS — N186 End stage renal disease: Secondary | ICD-10-CM | POA: Diagnosis not present

## 2020-10-24 DIAGNOSIS — Z992 Dependence on renal dialysis: Secondary | ICD-10-CM | POA: Diagnosis not present

## 2020-10-24 DIAGNOSIS — D631 Anemia in chronic kidney disease: Secondary | ICD-10-CM | POA: Diagnosis not present

## 2020-10-24 DIAGNOSIS — D259 Leiomyoma of uterus, unspecified: Secondary | ICD-10-CM | POA: Diagnosis not present

## 2020-10-24 DIAGNOSIS — N2581 Secondary hyperparathyroidism of renal origin: Secondary | ICD-10-CM | POA: Diagnosis not present

## 2020-10-26 DIAGNOSIS — N186 End stage renal disease: Secondary | ICD-10-CM | POA: Diagnosis not present

## 2020-10-26 DIAGNOSIS — D509 Iron deficiency anemia, unspecified: Secondary | ICD-10-CM | POA: Diagnosis not present

## 2020-10-26 DIAGNOSIS — N2581 Secondary hyperparathyroidism of renal origin: Secondary | ICD-10-CM | POA: Diagnosis not present

## 2020-10-26 DIAGNOSIS — D631 Anemia in chronic kidney disease: Secondary | ICD-10-CM | POA: Diagnosis not present

## 2020-10-26 DIAGNOSIS — Z992 Dependence on renal dialysis: Secondary | ICD-10-CM | POA: Diagnosis not present

## 2020-10-26 DIAGNOSIS — D259 Leiomyoma of uterus, unspecified: Secondary | ICD-10-CM | POA: Diagnosis not present

## 2020-10-29 DIAGNOSIS — D631 Anemia in chronic kidney disease: Secondary | ICD-10-CM | POA: Diagnosis not present

## 2020-10-29 DIAGNOSIS — N2581 Secondary hyperparathyroidism of renal origin: Secondary | ICD-10-CM | POA: Diagnosis not present

## 2020-10-29 DIAGNOSIS — Z992 Dependence on renal dialysis: Secondary | ICD-10-CM | POA: Diagnosis not present

## 2020-10-29 DIAGNOSIS — D259 Leiomyoma of uterus, unspecified: Secondary | ICD-10-CM | POA: Diagnosis not present

## 2020-10-29 DIAGNOSIS — N186 End stage renal disease: Secondary | ICD-10-CM | POA: Diagnosis not present

## 2020-10-29 DIAGNOSIS — D509 Iron deficiency anemia, unspecified: Secondary | ICD-10-CM | POA: Diagnosis not present

## 2020-10-30 ENCOUNTER — Telehealth: Payer: Self-pay

## 2020-10-30 NOTE — Telephone Encounter (Signed)
Patient called requesting to be scheduled for surgery.  Noted new referral in system to eval clotted L fem AVG that has drainage. Lvm for pt to contact office scheduling to make appt.

## 2020-10-31 DIAGNOSIS — N186 End stage renal disease: Secondary | ICD-10-CM | POA: Diagnosis not present

## 2020-10-31 DIAGNOSIS — Z992 Dependence on renal dialysis: Secondary | ICD-10-CM | POA: Diagnosis not present

## 2020-10-31 DIAGNOSIS — D631 Anemia in chronic kidney disease: Secondary | ICD-10-CM | POA: Diagnosis not present

## 2020-10-31 DIAGNOSIS — N2581 Secondary hyperparathyroidism of renal origin: Secondary | ICD-10-CM | POA: Diagnosis not present

## 2020-10-31 DIAGNOSIS — D259 Leiomyoma of uterus, unspecified: Secondary | ICD-10-CM | POA: Diagnosis not present

## 2020-10-31 DIAGNOSIS — D509 Iron deficiency anemia, unspecified: Secondary | ICD-10-CM | POA: Diagnosis not present

## 2020-11-02 DIAGNOSIS — D509 Iron deficiency anemia, unspecified: Secondary | ICD-10-CM | POA: Diagnosis not present

## 2020-11-02 DIAGNOSIS — D631 Anemia in chronic kidney disease: Secondary | ICD-10-CM | POA: Diagnosis not present

## 2020-11-02 DIAGNOSIS — Z992 Dependence on renal dialysis: Secondary | ICD-10-CM | POA: Diagnosis not present

## 2020-11-02 DIAGNOSIS — N2581 Secondary hyperparathyroidism of renal origin: Secondary | ICD-10-CM | POA: Diagnosis not present

## 2020-11-02 DIAGNOSIS — N186 End stage renal disease: Secondary | ICD-10-CM | POA: Diagnosis not present

## 2020-11-02 DIAGNOSIS — D259 Leiomyoma of uterus, unspecified: Secondary | ICD-10-CM | POA: Diagnosis not present

## 2020-11-05 DIAGNOSIS — D259 Leiomyoma of uterus, unspecified: Secondary | ICD-10-CM | POA: Diagnosis not present

## 2020-11-05 DIAGNOSIS — Z992 Dependence on renal dialysis: Secondary | ICD-10-CM | POA: Diagnosis not present

## 2020-11-05 DIAGNOSIS — N2581 Secondary hyperparathyroidism of renal origin: Secondary | ICD-10-CM | POA: Diagnosis not present

## 2020-11-05 DIAGNOSIS — N186 End stage renal disease: Secondary | ICD-10-CM | POA: Diagnosis not present

## 2020-11-05 DIAGNOSIS — D631 Anemia in chronic kidney disease: Secondary | ICD-10-CM | POA: Diagnosis not present

## 2020-11-05 DIAGNOSIS — D509 Iron deficiency anemia, unspecified: Secondary | ICD-10-CM | POA: Diagnosis not present

## 2020-11-06 ENCOUNTER — Ambulatory Visit: Payer: Medicare Other | Admitting: Vascular Surgery

## 2020-11-09 DIAGNOSIS — N186 End stage renal disease: Secondary | ICD-10-CM | POA: Diagnosis not present

## 2020-11-09 DIAGNOSIS — N2581 Secondary hyperparathyroidism of renal origin: Secondary | ICD-10-CM | POA: Diagnosis not present

## 2020-11-09 DIAGNOSIS — Z992 Dependence on renal dialysis: Secondary | ICD-10-CM | POA: Diagnosis not present

## 2020-11-09 DIAGNOSIS — D509 Iron deficiency anemia, unspecified: Secondary | ICD-10-CM | POA: Diagnosis not present

## 2020-11-09 DIAGNOSIS — D631 Anemia in chronic kidney disease: Secondary | ICD-10-CM | POA: Diagnosis not present

## 2020-11-09 DIAGNOSIS — D259 Leiomyoma of uterus, unspecified: Secondary | ICD-10-CM | POA: Diagnosis not present

## 2020-11-12 DIAGNOSIS — D631 Anemia in chronic kidney disease: Secondary | ICD-10-CM | POA: Diagnosis not present

## 2020-11-12 DIAGNOSIS — Z992 Dependence on renal dialysis: Secondary | ICD-10-CM | POA: Diagnosis not present

## 2020-11-12 DIAGNOSIS — D259 Leiomyoma of uterus, unspecified: Secondary | ICD-10-CM | POA: Diagnosis not present

## 2020-11-12 DIAGNOSIS — N186 End stage renal disease: Secondary | ICD-10-CM | POA: Diagnosis not present

## 2020-11-12 DIAGNOSIS — N2581 Secondary hyperparathyroidism of renal origin: Secondary | ICD-10-CM | POA: Diagnosis not present

## 2020-11-12 DIAGNOSIS — D509 Iron deficiency anemia, unspecified: Secondary | ICD-10-CM | POA: Diagnosis not present

## 2020-11-14 DIAGNOSIS — D631 Anemia in chronic kidney disease: Secondary | ICD-10-CM | POA: Diagnosis not present

## 2020-11-14 DIAGNOSIS — Z992 Dependence on renal dialysis: Secondary | ICD-10-CM | POA: Diagnosis not present

## 2020-11-14 DIAGNOSIS — D509 Iron deficiency anemia, unspecified: Secondary | ICD-10-CM | POA: Diagnosis not present

## 2020-11-14 DIAGNOSIS — N2581 Secondary hyperparathyroidism of renal origin: Secondary | ICD-10-CM | POA: Diagnosis not present

## 2020-11-14 DIAGNOSIS — D259 Leiomyoma of uterus, unspecified: Secondary | ICD-10-CM | POA: Diagnosis not present

## 2020-11-14 DIAGNOSIS — N186 End stage renal disease: Secondary | ICD-10-CM | POA: Diagnosis not present

## 2020-11-16 DIAGNOSIS — D509 Iron deficiency anemia, unspecified: Secondary | ICD-10-CM | POA: Diagnosis not present

## 2020-11-16 DIAGNOSIS — Z992 Dependence on renal dialysis: Secondary | ICD-10-CM | POA: Diagnosis not present

## 2020-11-16 DIAGNOSIS — N2581 Secondary hyperparathyroidism of renal origin: Secondary | ICD-10-CM | POA: Diagnosis not present

## 2020-11-16 DIAGNOSIS — D259 Leiomyoma of uterus, unspecified: Secondary | ICD-10-CM | POA: Diagnosis not present

## 2020-11-16 DIAGNOSIS — N186 End stage renal disease: Secondary | ICD-10-CM | POA: Diagnosis not present

## 2020-11-16 DIAGNOSIS — D631 Anemia in chronic kidney disease: Secondary | ICD-10-CM | POA: Diagnosis not present

## 2020-11-19 ENCOUNTER — Other Ambulatory Visit: Payer: Self-pay

## 2020-11-19 DIAGNOSIS — D259 Leiomyoma of uterus, unspecified: Secondary | ICD-10-CM | POA: Diagnosis not present

## 2020-11-19 DIAGNOSIS — I739 Peripheral vascular disease, unspecified: Secondary | ICD-10-CM

## 2020-11-19 DIAGNOSIS — N2581 Secondary hyperparathyroidism of renal origin: Secondary | ICD-10-CM | POA: Diagnosis not present

## 2020-11-19 DIAGNOSIS — Z992 Dependence on renal dialysis: Secondary | ICD-10-CM | POA: Diagnosis not present

## 2020-11-19 DIAGNOSIS — N186 End stage renal disease: Secondary | ICD-10-CM | POA: Diagnosis not present

## 2020-11-19 DIAGNOSIS — D509 Iron deficiency anemia, unspecified: Secondary | ICD-10-CM | POA: Diagnosis not present

## 2020-11-19 DIAGNOSIS — Q612 Polycystic kidney, adult type: Secondary | ICD-10-CM | POA: Diagnosis not present

## 2020-11-19 DIAGNOSIS — D631 Anemia in chronic kidney disease: Secondary | ICD-10-CM | POA: Diagnosis not present

## 2020-11-21 DIAGNOSIS — D631 Anemia in chronic kidney disease: Secondary | ICD-10-CM | POA: Diagnosis not present

## 2020-11-21 DIAGNOSIS — D509 Iron deficiency anemia, unspecified: Secondary | ICD-10-CM | POA: Diagnosis not present

## 2020-11-21 DIAGNOSIS — N2581 Secondary hyperparathyroidism of renal origin: Secondary | ICD-10-CM | POA: Diagnosis not present

## 2020-11-21 DIAGNOSIS — Z992 Dependence on renal dialysis: Secondary | ICD-10-CM | POA: Diagnosis not present

## 2020-11-21 DIAGNOSIS — D259 Leiomyoma of uterus, unspecified: Secondary | ICD-10-CM | POA: Diagnosis not present

## 2020-11-21 DIAGNOSIS — N186 End stage renal disease: Secondary | ICD-10-CM | POA: Diagnosis not present

## 2020-11-23 DIAGNOSIS — N2581 Secondary hyperparathyroidism of renal origin: Secondary | ICD-10-CM | POA: Diagnosis not present

## 2020-11-23 DIAGNOSIS — N186 End stage renal disease: Secondary | ICD-10-CM | POA: Diagnosis not present

## 2020-11-23 DIAGNOSIS — Z992 Dependence on renal dialysis: Secondary | ICD-10-CM | POA: Diagnosis not present

## 2020-11-23 DIAGNOSIS — D259 Leiomyoma of uterus, unspecified: Secondary | ICD-10-CM | POA: Diagnosis not present

## 2020-11-23 DIAGNOSIS — D509 Iron deficiency anemia, unspecified: Secondary | ICD-10-CM | POA: Diagnosis not present

## 2020-11-23 DIAGNOSIS — D631 Anemia in chronic kidney disease: Secondary | ICD-10-CM | POA: Diagnosis not present

## 2020-11-26 DIAGNOSIS — N2581 Secondary hyperparathyroidism of renal origin: Secondary | ICD-10-CM | POA: Diagnosis not present

## 2020-11-26 DIAGNOSIS — D631 Anemia in chronic kidney disease: Secondary | ICD-10-CM | POA: Diagnosis not present

## 2020-11-26 DIAGNOSIS — N186 End stage renal disease: Secondary | ICD-10-CM | POA: Diagnosis not present

## 2020-11-26 DIAGNOSIS — D259 Leiomyoma of uterus, unspecified: Secondary | ICD-10-CM | POA: Diagnosis not present

## 2020-11-26 DIAGNOSIS — D509 Iron deficiency anemia, unspecified: Secondary | ICD-10-CM | POA: Diagnosis not present

## 2020-11-26 DIAGNOSIS — Z992 Dependence on renal dialysis: Secondary | ICD-10-CM | POA: Diagnosis not present

## 2020-11-27 ENCOUNTER — Ambulatory Visit (HOSPITAL_COMMUNITY)
Admission: RE | Admit: 2020-11-27 | Discharge: 2020-11-27 | Disposition: A | Discharge status: Home / Self Care | Payer: Medicare Other | Source: Ambulatory Visit | Attending: Vascular Surgery | Admitting: Vascular Surgery

## 2020-11-27 ENCOUNTER — Ambulatory Visit (INDEPENDENT_AMBULATORY_CARE_PROVIDER_SITE_OTHER): Payer: Medicare Other | Admitting: Physician Assistant

## 2020-11-27 ENCOUNTER — Other Ambulatory Visit: Payer: Self-pay

## 2020-11-27 VITALS — BP 103/54 | HR 73 | Temp 97.9°F | Resp 20 | Ht 65.0 in | Wt 173.1 lb

## 2020-11-27 DIAGNOSIS — I739 Peripheral vascular disease, unspecified: Secondary | ICD-10-CM | POA: Insufficient documentation

## 2020-11-27 DIAGNOSIS — T827XXA Infection and inflammatory reaction due to other cardiac and vascular devices, implants and grafts, initial encounter: Secondary | ICD-10-CM | POA: Diagnosis not present

## 2020-11-27 NOTE — Progress Notes (Signed)
History of Present Illness:  Patient is a 75 y.o. year old female who presents for evaluation of claudication.  S/P right thigh graft by Dr. Scot Dock.   She comes in today with a small hole over the left thigh graft which is non functional.  The drainage stated about 3 weeks ago.  She denise fever and chills.  No pain or erythema.  The right thigh graft is functional.  She has HD TTS.    Past Medical History:  Diagnosis Date  . Ambulates with cane    occasional uses cane  . Anemia   . Arthritis   . Back pain, chronic   . Blood transfusion without reported diagnosis   . Cataract   . Complication of anesthesia    " I woke up during the procedure."  . ESRD (end stage renal disease) on dialysis ([place])    "TTS; [facility]" (02/07/2016)  . Hyperparathyroidism, secondary ([place])   . Hypertension    Historu - resolved -off meds now  . 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 Uterine fibroid     Past Surgical History:  Procedure Laterality Date  . ARTERIOVENOUS GRAFT PLACEMENT Right 03/19/2000   upper arm  . ARTERIOVENOUS GRAFT PLACEMENT Left 12/03/2000   thigh  . AV FISTULA PLACEMENT Left 05/11/2020   Procedure: Repair of ulceration left thigh arteriovenous graft;  Surgeon: Angelia Mould, MD;  Location: [facility];  Service: Vascular;  Laterality: Left;  . AV FISTULA PLACEMENT Right 06/11/2020   Procedure: INSERTION OF ARTERIOVENOUS (AV) GORE-TEX GRAFT  RIGHT THIGH;  Surgeon: Angelia Mould, MD;  Location: [facility];  Service: Vascular;  Laterality: Right;  . BIOPSY  09/28/2019   Procedure: BIOPSY;  Surgeon: Milus Banister, MD;  Location: [facility];  Service: Endoscopy;;  . CARPAL TUNNEL RELEASE Left 03/10/2013   Procedure: CARPAL TUNNEL RELEASE;  Surgeon: Wynonia Sours, MD;  Location: [facility];  Service: Orthopedics;  Laterality: Left;  . CARPAL TUNNEL RELEASE Right 03/28/2015    Procedure: RIGHT CARPAL TUNNEL RELEASE;  Surgeon: Daryll Brod, MD;  Location: [facility];  Service: Orthopedics;  Laterality: Right;  ANESTHESIA:  IV REGIONAL FAB  . COLONOSCOPY W/ BIOPSIES    . COLONOSCOPY WITH PROPOFOL N/A 05/14/2016   Procedure: COLONOSCOPY WITH PROPOFOL;  Surgeon: Milus Banister, MD;  Location: [facility];  Service: Endoscopy;  Laterality: N/A;  . COLONOSCOPY WITH PROPOFOL N/A 09/28/2019   Procedure: COLONOSCOPY WITH PROPOFOL;  Surgeon: Milus Banister, MD;  Location: [facility];  Service: Endoscopy;  Laterality: N/A;  HARD STICK-DIALYSIS PATIENT  . HEMICOLECTOMY  02/1996  . HERNIA REPAIR     laparoscopic repair during a [place] hospitalization from 03/26/2006-03/30/2006  . INCISIONAL HERNIA REPAIR  04/15/2006   laparoscopic  . INSERTION OF DIALYSIS CATHETER Left 06/06/2000   IJ Quinton catheter  . INSERTION OF DIALYSIS CATHETER Right 06/28/2000   IJ Ash catheter  . INSERTION OF DIALYSIS CATHETER Left 08/13/2000   subclavian Ash catheter  . INSERTION OF DIALYSIS CATHETER Left 05/11/2020   Procedure: Ultrasound-guided access to the right internal jugular vein with venogram right IJ and azygous vein 2.  Placement of left femoral 55 cm tunneled dialysis catheter under ultrasound guidance ;  Surgeon: Angelia Mould, MD;  Location: [facility] OR;  Service: Vascular;  Laterality: Left;  . multiple failed grafts  left thigh AVG 12/03/00, clotted -05/31/03, 01/24/04, 08/28/04, 09/06/04( thrombectomy and revision )Left AVG declot procedure including complete AV shuntogram, 08/29/04 left AV thrombolysis and angioplasty 2012 shunto gram to left thigh AVG  . POLYPECTOMY  09/28/2019   Procedure: POLYPECTOMY;  Surgeon: Milus Banister, MD;  Location: [facility];  Service: Endoscopy;;  . REMOVAL OF A DIALYSIS CATHETER  05/31/2000   Schon catheter  . REVISION OF ARTERIOVENOUS GORETEX GRAFT Left 06/23/2012   thigh; with exc. pseudoaneurysm  . REVISION OF ARTERIOVENOUS GORETEX  GRAFT Left 02/07/2016   Procedure: REVISION OF LEFT THIGH ARTERIOVENOUS GORETEX GRAFT;  Surgeon: Angelia Mould, MD;  Location: [facility];  Service: Vascular;  Laterality: Left;  . SHUNTOGRAM Left 02/16/2012   thigh; with angioplasty, venous anastomosis; stent, medial graft pseudoaneurysm  . SHUNTOGRAM N/A 02/16/2012   Procedure: Earney Mallet;  Surgeon: Serafina Mitchell, MD;  Location: [facility] CATH LAB;  Service: Cardiovascular;  Laterality: N/A;  . THROMBECTOMY / ARTERIOVENOUS GRAFT REVISION Left 02/07/2016   thigh  . THROMBECTOMY AND REVISION OF ARTERIOVENTOUS (AV) GORETEX  GRAFT Right 06/04/2000   upper arm  . THROMBECTOMY AND REVISION OF ARTERIOVENTOUS (AV) GORETEX  GRAFT Left 09/06/2004   thigh  . TRIGGER FINGER RELEASE Right 03/28/2015   Procedure: RELEASE A-1 PULLEY RIGHT THUMB;  Surgeon: Daryll Brod, MD;  Location: [facility];  Service: Orthopedics;  Laterality: Right;    ROS:   General:  No weight loss, Fever, chills  HEENT: No recent headaches, no nasal bleeding, no visual changes, no sore throat  Neurologic: No dizziness, blackouts, seizures. No recent symptoms of stroke or mini- stroke. No recent episodes of slurred speech, or temporary blindness.  Cardiac: No recent episodes of chest pain/pressure, no shortness of breath at rest.  No shortness of breath with exertion.  Denies history of atrial fibrillation or irregular heartbeat  Vascular: No history of rest pain in feet.  No history of claudication.  No history of non-healing ulcer, No history of DVT   Pulmonary: No home oxygen, no productive cough, no hemoptysis,  No asthma or wheezing  Musculoskeletal:  [ ]  Arthritis, [ ]  Low back pain,  [ ]  Joint pain  Hematologic:No history of hypercoagulable state.  No history of easy bleeding.  No history of anemia  Gastrointestinal: No hematochezia or melena,  No gastroesophageal reflux, no trouble swallowing  Urinary: [ ]  chronic Kidney disease, [ ]  on HD - [ ]  MWF or [ ]   TTHS, [ ]  Burning with urination, [ ]  Frequent urination, [ ]  Difficulty urinating;   Skin: No rashes  Psychological: No history of anxiety,  No history of depression  Social History Social History   Tobacco Use  . Smoking status: Former Smoker    Packs/day: 0.10    Types: Cigarettes    Quit date: 02/03/1978    Years since quitting: 42.8  . Smokeless tobacco: Never Used  Vaping Use  . Vaping Use: Never used  Substance Use Topics  . Alcohol use: Yes    Alcohol/week: 0.0 standard drinks    Comment: rare-once a year  . Drug use: No    Family History Family History  Problem Relation Age of Onset  . Colon cancer Mother 26  . Diabetes Cousin   . Breast cancer Maternal Aunt   . Breast cancer Cousin     Allergies  No Known Allergies   Current Outpatient Medications  Medication Sig Dispense Refill  . acetaminophen (TYLENOL) 500 MG tablet Take 500-1,000 mg by  mouth 2 (two) times daily as needed for moderate pain (for back pain).     . cinacalcet (SENSIPAR) 90 MG tablet Take 90 mg by mouth in the morning and at bedtime.     . heparin 1000 unit/mL SOLN injection Heparin Sodium (Porcine) 1,000 Units/mL Catheter Lock Arterial    . iron sucrose in sodium chloride 0.9 % 100 mL Iron Sucrose (Venofer)    . ketotifen (ZADITOR) 0.025 % ophthalmic solution Place 1 drop into both eyes daily as needed (for dry eyes).    . Methoxy PEG-Epoetin Beta (MIRCERA IJ) Mircera    . midodrine (PROAMATINE) 10 MG tablet Take 10 mg by mouth Every Tuesday,Thursday,and Saturday with dialysis.    Marland Kitchen NONFORMULARY OR COMPOUNDED ITEM [employer] compound: Nail Fungus - Complex Nail and Foot Disorders - Fluconazole 1%, DMSO 25%, Fluocinonide 0.05%, Ketoconazole 2%, apply 1-2 pumps to each affected nail beds and feet twice daily. 120 each 11  . Nutritional Supplements (FEEDING SUPPLEMENT, NEPRO CARB STEADY,) LIQD Take 237 mLs by mouth every Tuesday, Thursday, and Saturday at 6 PM.     . oxyCODONE-acetaminophen  (PERCOCET) 5-325 MG tablet Take 1 tablet by mouth every 4 (four) hours as needed for severe pain. 20 tablet 0  . sevelamer (RENVELA) 800 MG tablet Take 1,600-4,000 mg by mouth See admin instructions. Take 4000 mg  tablets with meals and take 1600 mg tablets with snacks     No current facility-administered medications for this visit.    Physical Examination  Vitals:   11/27/20 1057  BP: (!) 103/54  Pulse: 73  Resp: 20  Temp: 97.9 F (36.6 C)  TempSrc: Temporal  SpO2: 98%  Weight: 173 lb 1.6 oz (78.5 kg)  Height: 5\' 5"  (1.651 m)    Body mass index is 28.81 kg/m.  General:  Alert and oriented, no acute distress HEENT: Normal Neck: No bruit or JVD Pulmonary: Clear to auscultation bilaterally Cardiac: Regular Rate and Rhythm without murmur Abdomen: Soft, non-tender, non-distended, no mass, no scars Skin: No rash Left thigh soft without erythema, distal medial thigh graft has pin point hole with purulent drainage. Musculoskeletal: No deformity or edema  Neurologic: Upper and lower extremity motor 5/5 and symmetric  DATA:  ABI Findings:  +---------+------------------+-----+---------+-----------------------------  ----+  Right  Rt Pressure (mmHg)IndexWaveform Comment                +---------+------------------+-----+---------+-----------------------------  ----+  Brachial             triphasicUnable to obtain pressure  due to                         pain in limb             +---------+------------------+-----+---------+-----------------------------  ----+  PTA   255        2.36 triphasic                   +---------+------------------+-----+---------+-----------------------------  ----+  DP    140        1.30 triphasic                   +---------+------------------+-----+---------+-----------------------------  ----+  Great  Toe69        0.64 Abnormal                    +---------+------------------+-----+---------+-----------------------------  ----+   +---------+------------------+-----+---------+-------+  Left   Lt Pressure (mmHg)IndexWaveform Comment  +---------+------------------+-----+---------+-------+  Brachial 108  triphasic      +---------+------------------+-----+---------+-------+  PTA   130        1.20 triphasic      +---------+------------------+-----+---------+-------+  DP    138        1.28 triphasic      +---------+------------------+-----+---------+-------+  Great Toe60        0.56 Abnormal       +---------+------------------+-----+---------+-------+   +-------+-----------+-----------+------------+------------+  ABI/TBIToday's ABIToday's TBIPrevious ABIPrevious TBI  +-------+-----------+-----------+------------+------------+  Right 1.3    0.64    1.47    0.69      +-------+-----------+-----------+------------+------------+  Left  1.28    0.56    1.14    0.55      +-------+-----------+-----------+------------+------------+    Arterial wall calcification precludes accurate ankle pressures and ABIs.    Summary:  Right: Resting right ankle-brachial index indicates noncompressible right  lower extremity arteries. The right toe-brachial index is abnormal. ABIs  are unreliable.   Left: Resting left ankle-brachial index is within normal range. No  evidence of significant left lower extremity arterial disease. The left  toe-brachial index is abnormal.   ASSESSMENT:  Working right thigh av graft without issues Left old non functioning av graft with pin point hole and purulent drainage. ABI's show triphasic flow without history of claudication or non healing wounds.    PLAN: I will schedule her for left thigh graft  excision with irrigation and debridement.  This will help prevent infection that may spread to the functioning right av graft.  She is running out of access options.     Roxy Horseman PA-C [facility] Office: [phone]  MD in clinic [place]

## 2020-11-28 DIAGNOSIS — N2581 Secondary hyperparathyroidism of renal origin: Secondary | ICD-10-CM | POA: Diagnosis not present

## 2020-11-28 DIAGNOSIS — D631 Anemia in chronic kidney disease: Secondary | ICD-10-CM | POA: Diagnosis not present

## 2020-11-28 DIAGNOSIS — D509 Iron deficiency anemia, unspecified: Secondary | ICD-10-CM | POA: Diagnosis not present

## 2020-11-28 DIAGNOSIS — Z992 Dependence on renal dialysis: Secondary | ICD-10-CM | POA: Diagnosis not present

## 2020-11-28 DIAGNOSIS — D259 Leiomyoma of uterus, unspecified: Secondary | ICD-10-CM | POA: Diagnosis not present

## 2020-11-28 DIAGNOSIS — N186 End stage renal disease: Secondary | ICD-10-CM | POA: Diagnosis not present

## 2020-11-30 DIAGNOSIS — D631 Anemia in chronic kidney disease: Secondary | ICD-10-CM | POA: Diagnosis not present

## 2020-11-30 DIAGNOSIS — Z992 Dependence on renal dialysis: Secondary | ICD-10-CM | POA: Diagnosis not present

## 2020-11-30 DIAGNOSIS — N186 End stage renal disease: Secondary | ICD-10-CM | POA: Diagnosis not present

## 2020-11-30 DIAGNOSIS — N2581 Secondary hyperparathyroidism of renal origin: Secondary | ICD-10-CM | POA: Diagnosis not present

## 2020-11-30 DIAGNOSIS — D259 Leiomyoma of uterus, unspecified: Secondary | ICD-10-CM | POA: Diagnosis not present

## 2020-11-30 DIAGNOSIS — D509 Iron deficiency anemia, unspecified: Secondary | ICD-10-CM | POA: Diagnosis not present

## 2020-12-03 DIAGNOSIS — D631 Anemia in chronic kidney disease: Secondary | ICD-10-CM | POA: Diagnosis not present

## 2020-12-03 DIAGNOSIS — D259 Leiomyoma of uterus, unspecified: Secondary | ICD-10-CM | POA: Diagnosis not present

## 2020-12-03 DIAGNOSIS — N186 End stage renal disease: Secondary | ICD-10-CM | POA: Diagnosis not present

## 2020-12-03 DIAGNOSIS — D509 Iron deficiency anemia, unspecified: Secondary | ICD-10-CM | POA: Diagnosis not present

## 2020-12-03 DIAGNOSIS — N2581 Secondary hyperparathyroidism of renal origin: Secondary | ICD-10-CM | POA: Diagnosis not present

## 2020-12-03 DIAGNOSIS — Z992 Dependence on renal dialysis: Secondary | ICD-10-CM | POA: Diagnosis not present

## 2020-12-05 DIAGNOSIS — D631 Anemia in chronic kidney disease: Secondary | ICD-10-CM | POA: Diagnosis not present

## 2020-12-05 DIAGNOSIS — D259 Leiomyoma of uterus, unspecified: Secondary | ICD-10-CM | POA: Diagnosis not present

## 2020-12-05 DIAGNOSIS — Z992 Dependence on renal dialysis: Secondary | ICD-10-CM | POA: Diagnosis not present

## 2020-12-05 DIAGNOSIS — N186 End stage renal disease: Secondary | ICD-10-CM | POA: Diagnosis not present

## 2020-12-05 DIAGNOSIS — D509 Iron deficiency anemia, unspecified: Secondary | ICD-10-CM | POA: Diagnosis not present

## 2020-12-05 DIAGNOSIS — N2581 Secondary hyperparathyroidism of renal origin: Secondary | ICD-10-CM | POA: Diagnosis not present

## 2020-12-06 ENCOUNTER — Other Ambulatory Visit (HOSPITAL_COMMUNITY)
Admission: RE | Admit: 2020-12-06 | Discharge: 2020-12-06 | Disposition: A | Discharge status: Home / Self Care | Payer: Medicare Other | Source: Ambulatory Visit | Attending: Vascular Surgery | Admitting: Vascular Surgery

## 2020-12-06 DIAGNOSIS — Z20822 Contact with and (suspected) exposure to covid-19: Secondary | ICD-10-CM | POA: Insufficient documentation

## 2020-12-06 DIAGNOSIS — Z01812 Encounter for preprocedural laboratory examination: Secondary | ICD-10-CM | POA: Diagnosis not present

## 2020-12-06 NOTE — Progress Notes (Signed)
Unable to reach patient via phone.  Left detailed message with  instructions for day of surgery.     PCP - [place] Cardiologist - n/a  Chest x-ray - 05/11/20 (1V) EKG - 05/11/20 Stress Test - 05/15/14 ECHO - 05/30/14 Cardiac Cath - n/a  Anesthesia review: Yes  STOP now taking any Aspirin (unless otherwise instructed by your surgeon), Aleve, Naproxen, Ibuprofen, Motrin, Advil, Goody's, BC's, all herbal medications, fish oil, and all vitamins.   Coronavirus Screening Covid test is scheduled on 12/06/20.

## 2020-12-07 DIAGNOSIS — D509 Iron deficiency anemia, unspecified: Secondary | ICD-10-CM | POA: Diagnosis not present

## 2020-12-07 DIAGNOSIS — N186 End stage renal disease: Secondary | ICD-10-CM | POA: Diagnosis not present

## 2020-12-07 DIAGNOSIS — D259 Leiomyoma of uterus, unspecified: Secondary | ICD-10-CM | POA: Diagnosis not present

## 2020-12-07 DIAGNOSIS — N2581 Secondary hyperparathyroidism of renal origin: Secondary | ICD-10-CM | POA: Diagnosis not present

## 2020-12-07 DIAGNOSIS — D631 Anemia in chronic kidney disease: Secondary | ICD-10-CM | POA: Diagnosis not present

## 2020-12-07 DIAGNOSIS — Z992 Dependence on renal dialysis: Secondary | ICD-10-CM | POA: Diagnosis not present

## 2020-12-07 LAB — SARS CORONAVIRUS 2 (TAT 6-24 HRS): SARS Coronavirus 2: NEGATIVE

## 2020-12-09 ENCOUNTER — Ambulatory Visit (HOSPITAL_COMMUNITY): Payer: Medicare Other | Admitting: Emergency Medicine

## 2020-12-09 ENCOUNTER — Encounter (HOSPITAL_COMMUNITY)
Admission: RE | Disposition: A | Discharge status: Home / Self Care | Payer: Self-pay | Source: Home / Self Care | Attending: Vascular Surgery

## 2020-12-09 ENCOUNTER — Ambulatory Visit (HOSPITAL_COMMUNITY)
Admission: RE | Admit: 2020-12-09 | Discharge: 2020-12-09 | Disposition: A | Discharge status: Home / Self Care | Payer: Medicare Other | Attending: Vascular Surgery | Admitting: Vascular Surgery

## 2020-12-09 ENCOUNTER — Encounter (HOSPITAL_COMMUNITY): Payer: Self-pay | Admitting: Vascular Surgery

## 2020-12-09 ENCOUNTER — Other Ambulatory Visit: Payer: Self-pay

## 2020-12-09 DIAGNOSIS — Z800 Family history of malignant neoplasm of digestive organs: Secondary | ICD-10-CM | POA: Insufficient documentation

## 2020-12-09 DIAGNOSIS — Z803 Family history of malignant neoplasm of breast: Secondary | ICD-10-CM | POA: Insufficient documentation

## 2020-12-09 DIAGNOSIS — X58XXXA Exposure to other specified factors, initial encounter: Secondary | ICD-10-CM | POA: Diagnosis not present

## 2020-12-09 DIAGNOSIS — I120 Hypertensive chronic kidney disease with stage 5 chronic kidney disease or end stage renal disease: Secondary | ICD-10-CM | POA: Insufficient documentation

## 2020-12-09 DIAGNOSIS — N186 End stage renal disease: Secondary | ICD-10-CM | POA: Diagnosis not present

## 2020-12-09 DIAGNOSIS — Z79899 Other long term (current) drug therapy: Secondary | ICD-10-CM | POA: Diagnosis not present

## 2020-12-09 DIAGNOSIS — T82898A Other specified complication of vascular prosthetic devices, implants and grafts, initial encounter: Secondary | ICD-10-CM | POA: Insufficient documentation

## 2020-12-09 DIAGNOSIS — N2581 Secondary hyperparathyroidism of renal origin: Secondary | ICD-10-CM | POA: Diagnosis not present

## 2020-12-09 DIAGNOSIS — Z992 Dependence on renal dialysis: Secondary | ICD-10-CM | POA: Diagnosis not present

## 2020-12-09 DIAGNOSIS — Z833 Family history of diabetes mellitus: Secondary | ICD-10-CM | POA: Diagnosis not present

## 2020-12-09 DIAGNOSIS — Z87891 Personal history of nicotine dependence: Secondary | ICD-10-CM | POA: Diagnosis not present

## 2020-12-09 HISTORY — PX: AVGG REMOVAL: SHX5153

## 2020-12-09 LAB — POCT I-STAT, CHEM 8
BUN: 32 mg/dL — ABNORMAL HIGH (ref 8–23)
Calcium, Ion: 0.99 mmol/L — ABNORMAL LOW (ref 1.15–1.40)
Chloride: 99 mmol/L (ref 98–111)
Creatinine, Ser: 7.5 mg/dL — ABNORMAL HIGH (ref 0.44–1.00)
Glucose, Bld: 79 mg/dL (ref 70–99)
HCT: 40 % (ref 36.0–46.0)
Hemoglobin: 13.6 g/dL (ref 12.0–15.0)
Potassium: 4.9 mmol/L (ref 3.5–5.1)
Sodium: 141 mmol/L (ref 135–145)
TCO2: 35 mmol/L — ABNORMAL HIGH (ref 22–32)

## 2020-12-09 LAB — GLUCOSE, CAPILLARY: Glucose-Capillary: 92 mg/dL (ref 70–99)

## 2020-12-09 SURGERY — REMOVAL OF ARTERIOVENOUS GORETEX GRAFT (AVGG)
Anesthesia: General | Site: Leg Upper | Laterality: Left

## 2020-12-09 MED ORDER — ONDANSETRON HCL 4 MG/2ML IJ SOLN
INTRAMUSCULAR | Status: DC | PRN
  Administered 2020-12-09: 4 mg via INTRAVENOUS

## 2020-12-09 MED ORDER — CEFAZOLIN SODIUM-DEXTROSE 2-4 GM/100ML-% IV SOLN
2.0000 g | INTRAVENOUS | Status: AC
  Administered 2020-12-09: 2 g via INTRAVENOUS
  Filled 2020-12-09: qty 100

## 2020-12-09 MED ORDER — ACETAMINOPHEN 500 MG PO TABS: 1000.0000 mg | ORAL_TABLET | Freq: Once | ORAL | Status: AC

## 2020-12-09 MED ORDER — VASOPRESSIN 20 UNIT/ML IV SOLN
INTRAVENOUS | Status: DC | PRN
  Administered 2020-12-09: 3 [IU] via INTRAVENOUS

## 2020-12-09 MED ORDER — CHLORHEXIDINE GLUCONATE 0.12 % MT SOLN
OROMUCOSAL | Status: AC
  Administered 2020-12-09: 15 mL
  Filled 2020-12-09: qty 15

## 2020-12-09 MED ORDER — FENTANYL CITRATE (PF) 100 MCG/2ML IJ SOLN
INTRAMUSCULAR | Status: DC | PRN
  Administered 2020-12-09 (×2): 25 ug via INTRAVENOUS

## 2020-12-09 MED ORDER — PHENYLEPHRINE HCL-NACL 10-0.9 MG/250ML-% IV SOLN
INTRAVENOUS | Status: DC | PRN
  Administered 2020-12-09: 40 ug/min via INTRAVENOUS

## 2020-12-09 MED ORDER — SODIUM CHLORIDE 0.9 % IV SOLN
INTRAVENOUS | Status: AC
  Filled 2020-12-09: qty 1.2

## 2020-12-09 MED ORDER — PROPOFOL 10 MG/ML IV BOLUS
INTRAVENOUS | Status: DC | PRN
  Administered 2020-12-09: 100 mg via INTRAVENOUS

## 2020-12-09 MED ORDER — ALBUMIN HUMAN 5 % IV SOLN: 12.5000 g | Freq: Once | INTRAVENOUS | Status: AC

## 2020-12-09 MED ORDER — ACETAMINOPHEN 500 MG PO TABS
ORAL_TABLET | ORAL | Status: AC
  Administered 2020-12-09: 1000 mg via ORAL
  Filled 2020-12-09: qty 2

## 2020-12-09 MED ORDER — PHENYLEPHRINE HCL (PRESSORS) 10 MG/ML IV SOLN
INTRAVENOUS | Status: AC
  Filled 2020-12-09: qty 1

## 2020-12-09 MED ORDER — SODIUM CHLORIDE 0.9 % IV SOLN
INTRAVENOUS | Status: DC | PRN
  Administered 2020-12-09: 500 mL

## 2020-12-09 MED ORDER — MIDAZOLAM HCL 2 MG/2ML IJ SOLN
INTRAMUSCULAR | Status: AC
  Filled 2020-12-09: qty 2

## 2020-12-09 MED ORDER — CHLORHEXIDINE GLUCONATE 4 % EX LIQD: 60.0000 mL | Freq: Once | CUTANEOUS | Status: DC

## 2020-12-09 MED ORDER — MIDAZOLAM HCL 5 MG/5ML IJ SOLN
INTRAMUSCULAR | Status: DC | PRN
  Administered 2020-12-09: 2 mg via INTRAVENOUS

## 2020-12-09 MED ORDER — FENTANYL CITRATE (PF) 100 MCG/2ML IJ SOLN
INTRAMUSCULAR | Status: AC
  Administered 2020-12-09: 25 ug via INTRAVENOUS
  Filled 2020-12-09: qty 2

## 2020-12-09 MED ORDER — ROCURONIUM BROMIDE 10 MG/ML (PF) SYRINGE
PREFILLED_SYRINGE | INTRAVENOUS | Status: DC | PRN
  Administered 2020-12-09: 30 mg via INTRAVENOUS
  Administered 2020-12-09: 40 mg via INTRAVENOUS

## 2020-12-09 MED ORDER — SODIUM CHLORIDE 0.9 % IV SOLN: INTRAVENOUS | Status: DC

## 2020-12-09 MED ORDER — FENTANYL CITRATE (PF) 250 MCG/5ML IJ SOLN
INTRAMUSCULAR | Status: AC
  Filled 2020-12-09: qty 5

## 2020-12-09 MED ORDER — ALBUMIN HUMAN 5 % IV SOLN
INTRAVENOUS | Status: AC
  Administered 2020-12-09: 12.5 g via INTRAVENOUS
  Filled 2020-12-09: qty 250

## 2020-12-09 MED ORDER — SUGAMMADEX SODIUM 200 MG/2ML IV SOLN
INTRAVENOUS | Status: DC | PRN
  Administered 2020-12-09: 200 mg via INTRAVENOUS

## 2020-12-09 MED ORDER — PROPOFOL 10 MG/ML IV BOLUS
INTRAVENOUS | Status: AC
  Filled 2020-12-09: qty 20

## 2020-12-09 MED ORDER — OXYCODONE-ACETAMINOPHEN 5-325 MG PO TABS: 1.0000 | ORAL_TABLET | Freq: Four times a day (QID) | ORAL | 0 refills | Status: DC | PRN

## 2020-12-09 MED ORDER — ALBUMIN HUMAN 5 % IV SOLN: INTRAVENOUS | Status: DC | PRN

## 2020-12-09 MED ORDER — LIDOCAINE HCL (PF) 1 % IJ SOLN
INTRAMUSCULAR | Status: AC
  Filled 2020-12-09: qty 30

## 2020-12-09 MED ORDER — LIDOCAINE 2% (20 MG/ML) 5 ML SYRINGE
INTRAMUSCULAR | Status: AC
  Filled 2020-12-09: qty 5

## 2020-12-09 MED ORDER — LIDOCAINE 2% (20 MG/ML) 5 ML SYRINGE
INTRAMUSCULAR | Status: DC | PRN
  Administered 2020-12-09: 40 mg via INTRAVENOUS

## 2020-12-09 MED ORDER — FENTANYL CITRATE (PF) 100 MCG/2ML IJ SOLN: 25.0000 ug | INTRAMUSCULAR | Status: DC | PRN

## 2020-12-09 MED ORDER — VASOPRESSIN 20 UNIT/ML IV SOLN
INTRAVENOUS | Status: AC
  Filled 2020-12-09: qty 1

## 2020-12-09 SURGICAL SUPPLY — 49 items
ADH SKN CLS APL DERMABOND .7 (GAUZE/BANDAGES/DRESSINGS) ×1
AGENT HMST SPONGE THK3/8 (HEMOSTASIS)
ARMBAND PINK RESTRICT EXTREMIT (MISCELLANEOUS) ×2 IMPLANT
BAG ISL DRAPE 18X18 STRL (DRAPES) ×1
BAG ISOLATION DRAPE 18X18 (DRAPES) IMPLANT
BNDG CMPR MED 10X6 ELC LF (GAUZE/BANDAGES/DRESSINGS) ×1
BNDG ELASTIC 6X10 VLCR STRL LF (GAUZE/BANDAGES/DRESSINGS) ×1 IMPLANT
CANISTER SUCT 3000ML PPV (MISCELLANEOUS) ×2 IMPLANT
CLIP VESOCCLUDE MED 6/CT (CLIP) ×2 IMPLANT
CLIP VESOCCLUDE SM WIDE 6/CT (CLIP) ×2 IMPLANT
COVER WAND RF STERILE (DRAPES) ×1 IMPLANT
DECANTER SPIKE VIAL GLASS SM (MISCELLANEOUS) IMPLANT
DERMABOND ADVANCED (GAUZE/BANDAGES/DRESSINGS) ×1
DERMABOND ADVANCED .7 DNX12 (GAUZE/BANDAGES/DRESSINGS) ×1 IMPLANT
DRAPE HALF SHEET 40X57 (DRAPES) ×1 IMPLANT
DRAPE INCISE IOBAN 66X45 STRL (DRAPES) ×3 IMPLANT
DRAPE ISOLATION BAG 18X18 (DRAPES) ×2
DRAPE ORTHO SPLIT 77X108 STRL (DRAPES) ×2
DRAPE SURG ORHT 6 SPLT 77X108 (DRAPES) IMPLANT
ELECT REM PT RETURN 9FT ADLT (ELECTROSURGICAL) ×2
ELECTRODE REM PT RTRN 9FT ADLT (ELECTROSURGICAL) ×1 IMPLANT
GLOVE BIO SURGEON STRL SZ7.5 (GLOVE) ×2 IMPLANT
GLOVE SRG 8 PF TXTR STRL LF DI (GLOVE) ×1 IMPLANT
GLOVE SURG UNDER POLY LF SZ8 (GLOVE) ×2
GOWN STRL REUS W/ TWL LRG LVL3 (GOWN DISPOSABLE) ×2 IMPLANT
GOWN STRL REUS W/ TWL XL LVL3 (GOWN DISPOSABLE) ×2 IMPLANT
GOWN STRL REUS W/TWL LRG LVL3 (GOWN DISPOSABLE) ×4
GOWN STRL REUS W/TWL XL LVL3 (GOWN DISPOSABLE) ×2
HEMOSTAT SPONGE AVITENE ULTRA (HEMOSTASIS) ×1 IMPLANT
KIT BASIN OR (CUSTOM PROCEDURE TRAY) ×2 IMPLANT
KIT TURNOVER KIT B (KITS) ×2 IMPLANT
NDL HYPO 25GX1X1/2 BEV (NEEDLE) ×1 IMPLANT
NEEDLE HYPO 25GX1X1/2 BEV (NEEDLE) ×2 IMPLANT
NS IRRIG 1000ML POUR BTL (IV SOLUTION) ×2 IMPLANT
PACK CV ACCESS (CUSTOM PROCEDURE TRAY) ×2 IMPLANT
PAD ARMBOARD 7.5X6 YLW CONV (MISCELLANEOUS) ×4 IMPLANT
SPONGE LAP 18X18 RF (DISPOSABLE) ×2 IMPLANT
SUT MNCRL AB 4-0 PS2 18 (SUTURE) ×4 IMPLANT
SUT PROLENE 5 0 C 1 24 (SUTURE) ×4 IMPLANT
SUT PROLENE 5 0 C 1 36 (SUTURE) ×1 IMPLANT
SUT PROLENE 6 0 BV (SUTURE) ×3 IMPLANT
SUT SILK 2 0 PERMA HAND 18 BK (SUTURE) ×1 IMPLANT
SUT VIC AB 2-0 CT1 27 (SUTURE) ×4
SUT VIC AB 2-0 CT1 TAPERPNT 27 (SUTURE) IMPLANT
SUT VIC AB 3-0 SH 27 (SUTURE) ×10
SUT VIC AB 3-0 SH 27X BRD (SUTURE) ×1 IMPLANT
TOWEL GREEN STERILE (TOWEL DISPOSABLE) ×2 IMPLANT
UNDERPAD 30X36 HEAVY ABSORB (UNDERPADS AND DIAPERS) ×2 IMPLANT
WATER STERILE IRR 1000ML POUR (IV SOLUTION) ×2 IMPLANT

## 2020-12-09 NOTE — Anesthesia Preprocedure Evaluation (Addendum)
Anesthesia Evaluation  Patient identified by MRN, date of birth, ID band Patient awake    Reviewed: Allergy & Precautions, H&P , NPO status , Patient's Chart, lab work & pertinent test results  Airway Mallampati: III  TM Distance: >3 FB Neck ROM: Full    Dental no notable dental hx. (+) Edentulous Upper, Edentulous Lower, Dental Advisory Given   Pulmonary neg pulmonary ROS, former smoker,    Pulmonary exam normal breath sounds clear to auscultation       Cardiovascular hypertension, Pt. on medications  Rhythm:Regular Rate:Normal     Neuro/Psych negative neurological ROS  negative psych ROS   GI/Hepatic negative GI ROS, Neg liver ROS,   Endo/Other  negative endocrine ROS  Renal/GU ESRF and DialysisRenal disease  negative genitourinary   Musculoskeletal  (+) Arthritis , Osteoarthritis,    Abdominal   Peds  Hematology  (+) Blood dyscrasia, anemia ,   Anesthesia Other Findings   Reproductive/Obstetrics negative OB ROS                            Anesthesia Physical Anesthesia Plan  ASA: III  Anesthesia Plan: General   Post-op Pain Management:    Induction: Intravenous  PONV Risk Score and Plan: 4 or greater and Ondansetron, Dexamethasone and Treatment may vary due to age or medical condition  Airway Management Planned: Oral ETT and LMA  Additional Equipment:   Intra-op Plan:   Post-operative Plan: Extubation in OR  Informed Consent: I have reviewed the patients History and Physical, chart, labs and discussed the procedure including the risks, benefits and alternatives for the proposed anesthesia with the patient or authorized representative who has indicated his/her understanding and acceptance.     Dental advisory given  Plan Discussed with: CRNA  Anesthesia Plan Comments:         Anesthesia Quick Evaluation

## 2020-12-09 NOTE — Anesthesia Procedure Notes (Signed)
Procedure Name: Intubation Date/Time: 12/09/2020 1:39 PM Performed by: Hoy Morn, CRNA Pre-anesthesia Checklist: Patient identified, Emergency Drugs available, Suction available and Patient being monitored Patient Re-evaluated:Patient Re-evaluated prior to induction Oxygen Delivery Method: Circle system utilized Preoxygenation: Pre-oxygenation with 100% oxygen Induction Type: IV induction Ventilation: Mask ventilation without difficulty Laryngoscope Size: Miller and 2 Grade View: Grade I Tube type: Oral Number of attempts: 1 Airway Equipment and Method: Stylet and Oral airway Placement Confirmation: ETT inserted through vocal cords under direct vision,  positive ETCO2 and breath sounds checked- equal and bilateral Secured at: 18 cm Tube secured with: Tape Dental Injury: Teeth and Oropharynx as per pre-operative assessment

## 2020-12-09 NOTE — Transfer of Care (Signed)
Immediate Anesthesia Transfer of Care Note  Patient: Sue Neal  Procedure(s) Performed: LEFT THIGH ARTERIOVENOUS GORETEX GRAFT ([place]) REMOVAL (Left Leg Upper)  Patient Location: PACU  Anesthesia Type:General  Level of Consciousness: awake, alert  and oriented  Airway & Oxygen Therapy: Patient Spontanous Breathing and Patient connected to face mask oxygen  Post-op Assessment: Report given to RN and Post -op Vital signs reviewed and stable  Post vital signs: Reviewed and stable  Last Vitals:  Vitals Value Taken Time  BP 109/33 12/09/20 1547  Temp 36.7 C 12/09/20 1547  Pulse 79 12/09/20 1549  Resp 15 12/09/20 1549  SpO2 100 % 12/09/20 1549  Vitals shown include unvalidated device data.  Last Pain:  Vitals:   12/09/20 1046  TempSrc:   PainSc: 0-No pain         Complications: No complications documented.

## 2020-12-09 NOTE — H&P (Signed)
History and Physical Interval Note:  12/09/2020 12:43 PM  Sue Neal  has presented today for surgery, with the diagnosis of ARTERIOVENOUS GRAFT INFECTION.  The various methods of treatment have been discussed with the patient and family. After consideration of risks, benefits and other options for treatment, the patient has consented to  Procedure(s): LEFT THIGH ARTERIOVENOUS GORETEX GRAFT ([place]) REMOVAL (Left) as a surgical intervention.  The patient's history has been reviewed, patient examined, no change in status, stable for surgery.  I have reviewed the patient's chart and labs.  Questions were answered to the patient's satisfaction.    Plan left thigh AVG excision - only pinpoint area of drainage on distal thigh graft in distal left thigh.  Has right groin thigh graft that is working well.  Sue Neal  History of Present Illness:  Patient is a 75 y.o. year old female who presents for evaluation of claudication.  S/P right thigh graft by Dr. Scot Neal.   She comes in today with a small hole over the left thigh graft which is non functional.  The drainage stated about 3 weeks ago.  She denise fever and chills.  No pain or erythema.  The right thigh graft is functional.  She has HD TTS.        Past Medical History:  Diagnosis Date  . Ambulates with cane    occasional uses cane  . Anemia   . Arthritis   . Back pain, chronic   . Blood transfusion without reported diagnosis   . Cataract   . Complication of anesthesia    " I woke up during the procedure."  . ESRD (end stage renal disease) on dialysis ([place])    "TTS; [facility]" (02/07/2016)  . Hyperparathyroidism, secondary ([place])   . Hypertension    Historu - resolved -off meds now  . 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 Uterine fibroid          Past Surgical History:  Procedure Laterality Date  . ARTERIOVENOUS  GRAFT PLACEMENT Right 03/19/2000   upper arm  . ARTERIOVENOUS GRAFT PLACEMENT Left 12/03/2000   thigh  . AV FISTULA PLACEMENT Left 05/11/2020   Procedure: Repair of ulceration left thigh arteriovenous graft;  Surgeon: Angelia Mould, MD;  Location: [place];  Service: Vascular;  Laterality: Left;  . AV FISTULA PLACEMENT Right 06/11/2020   Procedure: INSERTION OF ARTERIOVENOUS (AV) GORE-TEX GRAFT  RIGHT THIGH;  Surgeon: Angelia Mould, MD;  Location: [place];  Service: Vascular;  Laterality: Right;  . BIOPSY  09/28/2019   Procedure: BIOPSY;  Surgeon: Milus Banister, MD;  Location: [facility];  Service: Endoscopy;;  . CARPAL TUNNEL RELEASE Left 03/10/2013   Procedure: CARPAL TUNNEL RELEASE;  Surgeon: Wynonia Sours, MD;  Location: [place];  Service: Orthopedics;  Laterality: Left;  . CARPAL TUNNEL RELEASE Right 03/28/2015   Procedure: RIGHT CARPAL TUNNEL RELEASE;  Surgeon: Daryll Brod, MD;  Location: [place];  Service: Orthopedics;  Laterality: Right;  ANESTHESIA:  IV REGIONAL FAB  . COLONOSCOPY W/ BIOPSIES    . COLONOSCOPY WITH PROPOFOL N/A 05/14/2016   Procedure: COLONOSCOPY WITH PROPOFOL;  Surgeon: Milus Banister, MD;  Location: [facility];  Service: Endoscopy;  Laterality: N/A;  . COLONOSCOPY WITH PROPOFOL N/A 09/28/2019   Procedure: COLONOSCOPY WITH PROPOFOL;  Surgeon: Milus Banister, MD;  Location: [facility];  Service:  Endoscopy;  Laterality: N/A;  HARD STICK-DIALYSIS PATIENT  . HEMICOLECTOMY  02/1996  . HERNIA REPAIR     laparoscopic repair during a [place] hospitalization from 03/26/2006-03/30/2006  . INCISIONAL HERNIA REPAIR  04/15/2006   laparoscopic  . INSERTION OF DIALYSIS CATHETER Left 06/06/2000   IJ Quinton catheter  . INSERTION OF DIALYSIS CATHETER Right 06/28/2000   IJ Ash catheter  . INSERTION OF DIALYSIS CATHETER Left 08/13/2000   subclavian Ash catheter  . INSERTION OF DIALYSIS CATHETER Left 05/11/2020    Procedure: Ultrasound-guided access to the right internal jugular vein with venogram right IJ and azygous vein 2.  Placement of left femoral 55 cm tunneled dialysis catheter under ultrasound guidance ;  Surgeon: Angelia Mould, MD;  Location[facility];  Service: Vascular;  Laterality: Left;  . multiple failed grafts     left thigh AVG 12/03/00, clotted -05/31/03, 01/24/04, 08/28/04, 09/06/04( thrombectomy and revision )Left AVG declot procedure including complete AV shuntogram, 08/29/04 left AV thrombolysis and angioplasty 2012 shunto gram to left thigh AVG  . POLYPECTOMY  09/28/2019   Procedure: POLYPECTOMY;  Surgeon: Milus Banister, MD;  Location: [facility];  Service: Endoscopy;;  . REMOVAL OF A DIALYSIS CATHETER  05/31/2000   Schon catheter  . REVISION OF ARTERIOVENOUS GORETEX GRAFT Left 06/23/2012   thigh; with exc. pseudoaneurysm  . REVISION OF ARTERIOVENOUS GORETEX GRAFT Left 02/07/2016   Procedure: REVISION OF LEFT THIGH ARTERIOVENOUS GORETEX GRAFT;  Surgeon: Angelia Mould, MD;  Location: [place];  Service: Vascular;  Laterality: Left;  . SHUNTOGRAM Left 02/16/2012   thigh; with angioplasty, venous anastomosis; stent, medial graft pseudoaneurysm  . SHUNTOGRAM N/A 02/16/2012   Procedure: Earney Mallet;  Surgeon: Serafina Mitchell, MD;  Location: [facility] CATH LAB;  Service: Cardiovascular;  Laterality: N/A;  . THROMBECTOMY / ARTERIOVENOUS GRAFT REVISION Left 02/07/2016   thigh  . THROMBECTOMY AND REVISION OF ARTERIOVENTOUS (AV) GORETEX  GRAFT Right 06/04/2000   upper arm  . THROMBECTOMY AND REVISION OF ARTERIOVENTOUS (AV) GORETEX  GRAFT Left 09/06/2004   thigh  . TRIGGER FINGER RELEASE Right 03/28/2015   Procedure: RELEASE A-1 PULLEY RIGHT THUMB;  Surgeon: Daryll Brod, MD;  Location: [place];  Service: Orthopedics;  Laterality: Right;    ROS:   General:  No weight loss, Fever, chills  HEENT: No recent headaches, no nasal bleeding, no visual changes, no sore  throat  Neurologic: No dizziness, blackouts, seizures. No recent symptoms of stroke or mini- stroke. No recent episodes of slurred speech, or temporary blindness.  Cardiac: No recent episodes of chest pain/pressure, no shortness of breath at rest.  No shortness of breath with exertion.  Denies history of atrial fibrillation or irregular heartbeat  Vascular: No history of rest pain in feet.  No history of claudication.  No history of non-healing ulcer, No history of DVT   Pulmonary: No home oxygen, no productive cough, no hemoptysis,  No asthma or wheezing  Musculoskeletal:  [ ]  Arthritis, [ ]  Low back pain,  [ ]  Joint pain  Hematologic:No history of hypercoagulable state.  No history of easy bleeding.  No history of anemia  Gastrointestinal: No hematochezia or melena,  No gastroesophageal reflux, no trouble swallowing  Urinary: [ ]  chronic Kidney disease, [ ]  on HD - [ ]  MWF or [ ]  TTHS, [ ]  Burning with urination, [ ]  Frequent urination, [ ]  Difficulty urinating;   Skin: No rashes  Psychological: No history of anxiety,  No history of depression  Social  History Social History        Tobacco Use  . Smoking status: Former Smoker    Packs/day: 0.10    Types: Cigarettes    Quit date: 02/03/1978    Years since quitting: 42.8  . Smokeless tobacco: Never Used  Vaping Use  . Vaping Use: Never used  Substance Use Topics  . Alcohol use: Yes    Alcohol/week: 0.0 standard drinks    Comment: rare-once a year  . Drug use: No    Family History      Family History  Problem Relation Age of Onset  . Colon cancer Mother 20  . Diabetes Cousin   . Breast cancer Maternal Aunt   . Breast cancer Cousin     Allergies  No Known Allergies         Current Outpatient Medications  Medication Sig Dispense Refill  . acetaminophen (TYLENOL) 500 MG tablet Take 500-1,000 mg by mouth 2 (two) times daily as needed for moderate pain (for back pain).     .  cinacalcet (SENSIPAR) 90 MG tablet Take 90 mg by mouth in the morning and at bedtime.     . heparin 1000 unit/mL SOLN injection Heparin Sodium (Porcine) 1,000 Units/mL Catheter Lock Arterial    . iron sucrose in sodium chloride 0.9 % 100 mL Iron Sucrose (Venofer)    . ketotifen (ZADITOR) 0.025 % ophthalmic solution Place 1 drop into both eyes daily as needed (for dry eyes).    . Methoxy PEG-Epoetin Beta (MIRCERA IJ) Mircera    . midodrine (PROAMATINE) 10 MG tablet Take 10 mg by mouth Every Tuesday,Thursday,and Saturday with dialysis.    [place] Kitchen NONFORMULARY OR COMPOUNDED ITEM Pharmazen compound: Nail Fungus - Complex Nail and Foot Disorders - Fluconazole 1%, DMSO 25%, Fluocinonide 0.05%, Ketoconazole 2%, apply 1-2 pumps to each affected nail beds and feet twice daily. 120 each 11  . Nutritional Supplements (FEEDING SUPPLEMENT, NEPRO CARB STEADY,) LIQD Take 237 mLs by mouth every Tuesday, Thursday, and Saturday at 6 PM.     . oxyCODONE-acetaminophen (PERCOCET) 5-325 MG tablet Take 1 tablet by mouth every 4 (four) hours as needed for severe pain. 20 tablet 0  . sevelamer (RENVELA) 800 MG tablet Take 1,600-4,000 mg by mouth See admin instructions. Take 4000 mg  tablets with meals and take 1600 mg tablets with snacks     No current facility-administered medications for this visit.    Physical Examination     Vitals:   11/27/20 1057  BP: (!) 103/54  Pulse: 73  Resp: 20  Temp: 97.9 F (36.6 C)  TempSrc: Temporal  SpO2: 98%  Weight: 173 lb 1.6 oz (78.5 kg)  Height: 5\' 5"  (1.651 m)    Body mass index is 28.81 kg/m.  General:  Alert and oriented, no acute distress HEENT: Normal Neck: No bruit or JVD Pulmonary: Clear to auscultation bilaterally Cardiac: Regular Rate and Rhythm without murmur Abdomen: Soft, non-tender, non-distended, no mass, no scars Skin: No rash Left thigh soft without erythema, distal medial thigh graft has pin point hole with purulent  drainage. Musculoskeletal: No deformity or edema      Neurologic: Upper and lower extremity motor 5/5 and symmetric  DATA:  ABI Findings:  +---------+------------------+-----+---------+-----------------------------  ----+  Right  Rt Pressure (mmHg)IndexWaveform Comment                +---------+------------------+-----+---------+-----------------------------  ----+  Brachial             triphasicUnable to obtain pressure  due to                         pain in limb             +---------+------------------+-----+---------+-----------------------------  ----+  PTA   255        2.36 triphasic                   +---------+------------------+-----+---------+-----------------------------  ----+  DP    140        1.30 triphasic                   +---------+------------------+-----+---------+-----------------------------  ----+  Great Toe69        0.64 Abnormal                    +---------+------------------+-----+---------+-----------------------------  ----+   +---------+------------------+-----+---------+-------+  Left   Lt Pressure (mmHg)IndexWaveform Comment  +---------+------------------+-----+---------+-------+  Brachial 108           triphasic      +---------+------------------+-----+---------+-------+  PTA   130        1.20 triphasic      +---------+------------------+-----+---------+-------+  DP    138        1.28 triphasic      +---------+------------------+-----+---------+-------+  Great Toe60        0.56 Abnormal       +---------+------------------+-----+---------+-------+   +-------+-----------+-----------+------------+------------+  ABI/TBIToday's ABIToday's TBIPrevious ABIPrevious TBI   +-------+-----------+-----------+------------+------------+  Right 1.3    0.64    1.47    0.69      +-------+-----------+-----------+------------+------------+  Left  1.28    0.56    1.14    0.55      +-------+-----------+-----------+------------+------------+    Arterial wall calcification precludes accurate ankle pressures and ABIs.    Summary:  Right: Resting right ankle-brachial index indicates noncompressible right  lower extremity arteries. The right toe-brachial index is abnormal. ABIs  are unreliable.   Left: Resting left ankle-brachial index is within normal range. No  evidence of significant left lower extremity arterial disease. The left  toe-brachial index is abnormal.   ASSESSMENT:  Working right thigh av graft without issues Left old non functioning av graft with pin point hole and purulent drainage. ABI's show triphasic flow without history of claudication or non healing wounds.    PLAN: I will schedule her for left thigh graft excision with irrigation and debridement.  This will help prevent infection that may spread to the functioning right av graft.  She is running out of access options.     Roxy Horseman PA-C [facility] Office: [phone]  MD in clinic [place]

## 2020-12-09 NOTE — Op Note (Signed)
Date: December 09, 2020  Preoperative diagnosis: Concern for infected left thigh AV loop graft  Postoperative diagnosis: Same  Procedure: Excision of left thigh AV graft  Surgeon: Dr. [name], MD  Assistant: Leontine Locket, PA  Indication: Patient is a 75 year old female who was seen in clinic last week with draining sinus from a left thigh AV loop graft that she is no longer using.  She presents today for left thigh AV graft excision after risk benefits discussed.  An assistant was needed for exposure and to expedite the case.  Findings: Initially made a longitudinal incision just above the left inguinal crease over the arterial and venous limbs of the graft in the groin.  Ultimately dissected down to both artery and vein where the graft was well incorporated with no obvious purulence.  I transected the graft off both the artery and vein and left small cuffs and oversewed the cuffs with 5-0 Prolene.  We then closed this portion of the incision as a clean field.  I then turned my attention to the  thigh graft where we made three counterincisions over the graft and this was circumferentially dissected and excised.  I did send the graft for culture although overall it appeared very well incorporated.  I also excised several limbs that were in discontinuity from previous left thigh AV graft revisions.  Anesthesia: LMA  Details: Patient was taken to the operating room after informed consent was obtained.  Placed on the operative table supine position.  Left groin and thigh were then prepped and draped in usual sterile fashion.  Initially made a longitudinal incision just above the inguinal crease where her previous incision was.  Dissected down where I could feel both arterial and venous limbs of the graft.  Ultimately I dissected out each limb down to the top of the common femoral artery as well as onto the saphenofemoral junction.  Ultimately the grafts were very well incorporated with no  obvious purulence.  As a result I elected to leave small cuffs on both artery and vein.  I then used right angle clamps and each limb was transected and oversewn with 5-0 Prolene with a baseball stitch in running format.  I then was able to mobilize each end of the graft after it was transected from the artery and vein through the groin exposure.  This was then copiously irrigated I closed the groin in multiple layers of 2-0 Vicryl, 3-0 Vicryl, 4-0 Monocryl and Dermabond.  We then turned our attention to the thigh where he made 2 longitudinal incisions and one transverse incision for counterincisions to fully mobilized the graft in its entirety.  I used Metzenbaum scissors and Bovie cautery and the graft was ultimately excised in its entire length.  This was densely scarred in and very well adherent.  Finally I was able to get it completely excised and it was passed off the field as one piece.  I did send this to the lab for culture.  The 3 counterincisions were then irrigated out copiously.  Hemostasis was achieved with Bovie cautery.  Subcutaneous tissue was closed with running 3-0 Vicryl 4-0 Monocryl Dermabond.  I did apply a pressure dressing to the left lower extremity to provide some compression and prevent hematoma formation from oozing in the tunnels where the graft had been excised.  Taken to recovery in stable condition.  Complication: None  Condition: Stable  [name], MD [facility] Office: [place]  Carlis Abbott

## 2020-12-09 NOTE — Discharge Instructions (Signed)
[facility]  Discharge Instructions  AV Fistula or Graft Surgery for Dialysis Access  Please refer to the following instructions for your post-procedure care. Your surgeon or physician assistant will discuss any changes with you.  Activity  You may drive the day following your surgery, if you are comfortable and no longer taking prescription pain medication. Resume full activity as the soreness in your incision resolves.  Bathing/Showering  You may shower after you go home. Keep your incision dry for 48 hours. Do not soak in a bathtub, hot tub, or swim until the incision heals completely. You may not shower if you have a hemodialysis catheter.  Incision Care  Clean your incision with mild soap and water after 48 hours. Pat the area dry with a clean towel. You do not need a bandage unless otherwise instructed. Do not apply any ointments or creams to your incision. You may have skin glue on your incision. Do not peel it off. It will come off on its own in about one week.   Wash the groin incision with soap and water daily and pat dry. (No tub bath-only shower)  Then cover with a dry gauze to keep this area dry daily and as change as needed.  Do not use Vaseline or neosporin on your incisions.  Only use soap and water on your incisions and then protect and keep dry.    IT IS IMPORTANT TO KEEP YOUR GROIN INCISION CLEAN AND DRY TO HELP PREVENT WOUND INFECTION.  THE ONLY TIME IT SHOULD BE WET IS IF YOU ARE CLEANING WITH SOAP AND WATER.  Remove ace wrap on 12/10/2020.   Diet  Resume your normal diet. There are not special food restrictions following this procedure. In order to heal from your surgery, it is CRITICAL to get adequate nutrition. Your body requires vitamins, minerals, and protein. Vegetables are the best source of vitamins and minerals. Vegetables also provide the perfect balance of protein. Processed food has little nutritional value, so try to  avoid this.  Medications  Resume taking all of your medications. If your incision is causing pain, you may take over-the counter pain relievers such as acetaminophen (Tylenol). If you were prescribed a stronger pain medication, please be aware these medications can cause nausea and constipation. Prevent nausea by taking the medication with a snack or meal. Avoid constipation by drinking plenty of fluids and eating foods with high amount of fiber, such as fruits, vegetables, and grains.  Do not take Tylenol if you are taking prescription pain medications.  Follow up Your surgeon may want to see you in the office following your access surgery. If so, this will be arranged at the time of your surgery.  Please call us immediately for any of the following conditions:  . Increased pain, redness, drainage (pus) from your incision site . Fever of 101 degrees or higher . Severe or worsening pain at your incision site . Hand pain or numbness. .  Reduce your risk of vascular disease:  . Stop smoking. If you would like help, call [facility] at 1-800-QUIT-NOW 531-767-7707) or [place] at [phone]  . Manage your cholesterol . Maintain a desired weight . Control your diabetes . Keep your blood pressure down  Dialysis  It will take several weeks to several months for your new dialysis access to be ready for use. Your surgeon will determine when it is okay to use it. Your nephrologist will continue to direct your dialysis. You can  continue to use your Permcath until your new access is ready for use.   12/09/2020 Sue Neal [id number] 09/12/1946  Surgeon(s): [name], MD  Procedure(s): LEFT THIGH ARTERIOVENOUS GORETEX GRAFT ([place]) REMOVAL    If you have any questions, please call the office at [phone].

## 2020-12-10 ENCOUNTER — Telehealth: Payer: Self-pay

## 2020-12-10 ENCOUNTER — Encounter (HOSPITAL_COMMUNITY): Payer: Self-pay | Admitting: Vascular Surgery

## 2020-12-10 NOTE — Telephone Encounter (Signed)
HH went to visit today and patient informed them that she is not accepting visitors today and would like to start tomorrow. Gave verbal okay.

## 2020-12-10 NOTE — Anesthesia Postprocedure Evaluation (Signed)
Anesthesia Post Note  Patient: Barbie Croston  Procedure(s) Performed: LEFT THIGH ARTERIOVENOUS GORETEX GRAFT ([place]) REMOVAL (Left Leg Upper)     Patient location during evaluation: PACU Anesthesia Type: General Level of consciousness: awake and alert Pain management: pain level controlled Vital Signs Assessment: post-procedure vital signs reviewed and stable Respiratory status: spontaneous breathing, nonlabored ventilation and respiratory function stable Cardiovascular status: blood pressure returned to baseline and stable Postop Assessment: no apparent nausea or vomiting Anesthetic complications: no   No complications documented.  Last Vitals:  Vitals:   12/09/20 1747 12/09/20 1748  BP: (!) 106/39 (!) 117/53  Pulse:    Resp:    Temp:    SpO2:      Last Pain:  Vitals:   12/09/20 1730  TempSrc:   PainSc: 1                  Sheranda Seabrooks,W. EDMOND

## 2020-12-10 NOTE — Telephone Encounter (Signed)
Patient is s/p thigh graft removal yesterday. States she has removed the bandage - she would like clarification on wound cleaning instructions. Advised patient that she could shower - wash the area with mild soap and water and pat dry and keep covered with clean dry gauze. Patient verbalizes understanding.

## 2020-12-11 DIAGNOSIS — Z992 Dependence on renal dialysis: Secondary | ICD-10-CM | POA: Diagnosis not present

## 2020-12-11 DIAGNOSIS — T827XXD Infection and inflammatory reaction due to other cardiac and vascular devices, implants and grafts, subsequent encounter: Secondary | ICD-10-CM | POA: Diagnosis not present

## 2020-12-11 DIAGNOSIS — Z87891 Personal history of nicotine dependence: Secondary | ICD-10-CM | POA: Diagnosis not present

## 2020-12-11 DIAGNOSIS — I120 Hypertensive chronic kidney disease with stage 5 chronic kidney disease or end stage renal disease: Secondary | ICD-10-CM | POA: Diagnosis not present

## 2020-12-11 DIAGNOSIS — N186 End stage renal disease: Secondary | ICD-10-CM | POA: Diagnosis not present

## 2020-12-12 DIAGNOSIS — D259 Leiomyoma of uterus, unspecified: Secondary | ICD-10-CM | POA: Diagnosis not present

## 2020-12-12 DIAGNOSIS — N2581 Secondary hyperparathyroidism of renal origin: Secondary | ICD-10-CM | POA: Diagnosis not present

## 2020-12-12 DIAGNOSIS — Z992 Dependence on renal dialysis: Secondary | ICD-10-CM | POA: Diagnosis not present

## 2020-12-12 DIAGNOSIS — N186 End stage renal disease: Secondary | ICD-10-CM | POA: Diagnosis not present

## 2020-12-12 DIAGNOSIS — D509 Iron deficiency anemia, unspecified: Secondary | ICD-10-CM | POA: Diagnosis not present

## 2020-12-12 DIAGNOSIS — D631 Anemia in chronic kidney disease: Secondary | ICD-10-CM | POA: Diagnosis not present

## 2020-12-13 DIAGNOSIS — Z992 Dependence on renal dialysis: Secondary | ICD-10-CM | POA: Diagnosis not present

## 2020-12-13 DIAGNOSIS — T827XXD Infection and inflammatory reaction due to other cardiac and vascular devices, implants and grafts, subsequent encounter: Secondary | ICD-10-CM | POA: Diagnosis not present

## 2020-12-13 DIAGNOSIS — Z87891 Personal history of nicotine dependence: Secondary | ICD-10-CM | POA: Diagnosis not present

## 2020-12-13 DIAGNOSIS — N186 End stage renal disease: Secondary | ICD-10-CM | POA: Diagnosis not present

## 2020-12-13 DIAGNOSIS — I120 Hypertensive chronic kidney disease with stage 5 chronic kidney disease or end stage renal disease: Secondary | ICD-10-CM | POA: Diagnosis not present

## 2020-12-14 DIAGNOSIS — D259 Leiomyoma of uterus, unspecified: Secondary | ICD-10-CM | POA: Diagnosis not present

## 2020-12-14 DIAGNOSIS — D509 Iron deficiency anemia, unspecified: Secondary | ICD-10-CM | POA: Diagnosis not present

## 2020-12-14 DIAGNOSIS — N2581 Secondary hyperparathyroidism of renal origin: Secondary | ICD-10-CM | POA: Diagnosis not present

## 2020-12-14 DIAGNOSIS — N186 End stage renal disease: Secondary | ICD-10-CM | POA: Diagnosis not present

## 2020-12-14 DIAGNOSIS — D631 Anemia in chronic kidney disease: Secondary | ICD-10-CM | POA: Diagnosis not present

## 2020-12-14 DIAGNOSIS — Z992 Dependence on renal dialysis: Secondary | ICD-10-CM | POA: Diagnosis not present

## 2020-12-14 LAB — AEROBIC/ANAEROBIC CULTURE W GRAM STAIN (SURGICAL/DEEP WOUND)
Culture: NO GROWTH
Gram Stain: NONE SEEN

## 2020-12-16 DIAGNOSIS — T827XXD Infection and inflammatory reaction due to other cardiac and vascular devices, implants and grafts, subsequent encounter: Secondary | ICD-10-CM | POA: Diagnosis not present

## 2020-12-16 DIAGNOSIS — I120 Hypertensive chronic kidney disease with stage 5 chronic kidney disease or end stage renal disease: Secondary | ICD-10-CM | POA: Diagnosis not present

## 2020-12-16 DIAGNOSIS — N186 End stage renal disease: Secondary | ICD-10-CM | POA: Diagnosis not present

## 2020-12-16 DIAGNOSIS — Z87891 Personal history of nicotine dependence: Secondary | ICD-10-CM | POA: Diagnosis not present

## 2020-12-16 DIAGNOSIS — Z992 Dependence on renal dialysis: Secondary | ICD-10-CM | POA: Diagnosis not present

## 2020-12-17 ENCOUNTER — Other Ambulatory Visit: Payer: Self-pay

## 2020-12-17 ENCOUNTER — Encounter: Payer: Self-pay | Admitting: Vascular Surgery

## 2020-12-17 ENCOUNTER — Ambulatory Visit (INDEPENDENT_AMBULATORY_CARE_PROVIDER_SITE_OTHER): Payer: Medicare Other | Admitting: Vascular Surgery

## 2020-12-17 VITALS — BP 104/65 | HR 84 | Temp 97.9°F | Resp 16 | Ht 65.0 in | Wt 172.0 lb

## 2020-12-17 DIAGNOSIS — N2581 Secondary hyperparathyroidism of renal origin: Secondary | ICD-10-CM | POA: Diagnosis not present

## 2020-12-17 DIAGNOSIS — N186 End stage renal disease: Secondary | ICD-10-CM | POA: Diagnosis not present

## 2020-12-17 DIAGNOSIS — D509 Iron deficiency anemia, unspecified: Secondary | ICD-10-CM | POA: Diagnosis not present

## 2020-12-17 DIAGNOSIS — D631 Anemia in chronic kidney disease: Secondary | ICD-10-CM | POA: Diagnosis not present

## 2020-12-17 DIAGNOSIS — Z992 Dependence on renal dialysis: Secondary | ICD-10-CM | POA: Diagnosis not present

## 2020-12-17 DIAGNOSIS — T82590D Other mechanical complication of surgically created arteriovenous fistula, subsequent encounter: Secondary | ICD-10-CM

## 2020-12-17 DIAGNOSIS — D259 Leiomyoma of uterus, unspecified: Secondary | ICD-10-CM | POA: Diagnosis not present

## 2020-12-17 NOTE — Progress Notes (Signed)
Patient name: Sue Neal MRN: [id number] DOB: [birth year] Sex: female  REASON FOR VISIT: Left groin wound check  HPI: Sue Neal is a 75 y.o. female with end-stage renal disease and multiple other medical issues as listed below that presents for postop check after excision of left thigh AV graft.  She had a draining sinus in the distal thigh over her graft and underwent excision of the graft on 12/09/2020.  I did leave a small cuffs on the artery and vein given that the graft appeared very well incorporated with no obvious purulence, etc.  She reports all her incisions are healing without issue.  She does have home health coming out to change her dressings 3 times a week.  Cultures did not grow anything from the OR when I sent the graft.  She has a right thigh graft that is working well for dialysis.  Past Medical History:  Diagnosis Date  . Ambulates with cane    occasional uses cane  . Anemia   . Arthritis   . Back pain, chronic   . Blood transfusion without reported diagnosis   . Cataract   . Complication of anesthesia    " I woke up during the procedure."  . ESRD (end stage renal disease) on dialysis ([place])    "TTS; [place]" (02/07/2016)  . Hyperparathyroidism, secondary ([place])   . Hypertension    Historu - resolved -off meds now  . 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 Uterine fibroid     Past Surgical History:  Procedure Laterality Date  . ARTERIOVENOUS GRAFT PLACEMENT Right 03/19/2000   upper arm  . ARTERIOVENOUS GRAFT PLACEMENT Left 12/03/2000   thigh  . AV FISTULA PLACEMENT Left 05/11/2020   Procedure: Repair of ulceration left thigh arteriovenous graft;  Surgeon: Angelia Mould, MD;  Location: [facility];  Service: Vascular;  Laterality: Left;  . AV FISTULA PLACEMENT Right 06/11/2020   Procedure: INSERTION OF ARTERIOVENOUS (AV) GORE-TEX GRAFT  RIGHT THIGH;  Surgeon:  Angelia Mould, MD;  Location: [place] [facility];  Service: Vascular;  Laterality: Right;  . [place] REMOVAL Left 12/09/2020   Procedure: LEFT THIGH ARTERIOVENOUS GORETEX GRAFT ([place]) REMOVAL;  Surgeon: [name], MD;  Location: [facility];  Service: Vascular;  Laterality: Left;  . BIOPSY  09/28/2019   Procedure: BIOPSY;  Surgeon: Milus Banister, MD;  Location: [facility];  Service: Endoscopy;;  . CARPAL TUNNEL RELEASE Left 03/10/2013   Procedure: CARPAL TUNNEL RELEASE;  Surgeon: Wynonia Sours, MD;  Location: [facility];  Service: Orthopedics;  Laterality: Left;  . CARPAL TUNNEL RELEASE Right 03/28/2015   Procedure: RIGHT CARPAL TUNNEL RELEASE;  Surgeon: Daryll Brod, MD;  Location: [facility];  Service: Orthopedics;  Laterality: Right;  ANESTHESIA:  IV REGIONAL FAB  . COLONOSCOPY W/ BIOPSIES    . COLONOSCOPY WITH PROPOFOL N/A 05/14/2016   Procedure: COLONOSCOPY WITH PROPOFOL;  Surgeon: Milus Banister, MD;  Location: [facility];  Service: Endoscopy;  Laterality: N/A;  . COLONOSCOPY WITH PROPOFOL N/A 09/28/2019   Procedure: COLONOSCOPY WITH PROPOFOL;  Surgeon: Milus Banister, MD;  Location: [facility];  Service: Endoscopy;  Laterality: N/A;  HARD STICK-DIALYSIS PATIENT  . HEMICOLECTOMY  02/1996  . HERNIA REPAIR     laparoscopic repair during a [place] hospitalization from 03/26/2006-03/30/2006  . INCISIONAL HERNIA REPAIR  04/15/2006   laparoscopic  .  INSERTION OF DIALYSIS CATHETER Left 06/06/2000   IJ [place] catheter  . INSERTION OF DIALYSIS CATHETER Right 06/28/2000   IJ Ash catheter  . INSERTION OF DIALYSIS CATHETER Left 08/13/2000   subclavian Ash catheter  . INSERTION OF DIALYSIS CATHETER Left 05/11/2020   Procedure: Ultrasound-guided access to the right internal jugular vein with venogram right IJ and azygous vein 2.  Placement of left femoral 55 cm tunneled dialysis catheter under ultrasound guidance ;  Surgeon: Angelia Mould, MD;  Location: [facility] OR;   Service: Vascular;  Laterality: Left;  . multiple failed grafts     left thigh AVG 12/03/00, clotted -05/31/03, 01/24/04, 08/28/04, 09/06/04( thrombectomy and revision )Left AVG declot procedure including complete AV shuntogram, 08/29/04 left AV thrombolysis and angioplasty 2012 shunto gram to left thigh AVG  . POLYPECTOMY  09/28/2019   Procedure: POLYPECTOMY;  Surgeon: Milus Banister, MD;  Location: [facility];  Service: Endoscopy;;  . REMOVAL OF A DIALYSIS CATHETER  05/31/2000   Schon catheter  . REVISION OF ARTERIOVENOUS GORETEX GRAFT Left 06/23/2012   thigh; with exc. pseudoaneurysm  . REVISION OF ARTERIOVENOUS GORETEX GRAFT Left 02/07/2016   Procedure: REVISION OF LEFT THIGH ARTERIOVENOUS GORETEX GRAFT;  Surgeon: Angelia Mould, MD;  Location: [place];  Service: Vascular;  Laterality: Left;  . SHUNTOGRAM Left 02/16/2012   thigh; with angioplasty, venous anastomosis; stent, medial graft pseudoaneurysm  . SHUNTOGRAM N/A 02/16/2012   Procedure: Earney Mallet;  Surgeon: Serafina Mitchell, MD;  Location: [facility] CATH LAB;  Service: Cardiovascular;  Laterality: N/A;  . THROMBECTOMY / ARTERIOVENOUS GRAFT REVISION Left 02/07/2016   thigh  . THROMBECTOMY AND REVISION OF ARTERIOVENTOUS (AV) GORETEX  GRAFT Right 06/04/2000   upper arm  . THROMBECTOMY AND REVISION OF ARTERIOVENTOUS (AV) GORETEX  GRAFT Left 09/06/2004   thigh  . TRIGGER FINGER RELEASE Right 03/28/2015   Procedure: RELEASE A-1 PULLEY RIGHT THUMB;  Surgeon: Daryll Brod, MD;  Location: [place];  Service: Orthopedics;  Laterality: Right;    Family History  Problem Relation Age of Onset  . Colon cancer Mother 65  . Diabetes Cousin   . Breast cancer Maternal Aunt   . Breast cancer Cousin     SOCIAL HISTORY: Social History   Tobacco Use  . Smoking status: Former Smoker    Packs/day: 0.10    Types: Cigarettes    Quit date: 02/03/1978    Years since quitting: 42.8  . Smokeless tobacco: Never Used  Substance Use Topics  .  Alcohol use: Yes    Alcohol/week: 0.0 standard drinks    Comment: rare-once a year    No Known Allergies  Current Outpatient Medications  Medication Sig Dispense Refill  . acetaminophen (TYLENOL) 500 MG tablet Take 500-1,000 mg by mouth every 6 (six) hours as needed for moderate pain.    . cinacalcet (SENSIPAR) 60 MG tablet Take 60 mg by mouth in the morning and at bedtime.    . multivitamin (RENA-VIT) TABS tablet Take 1 tablet by mouth 2 (two) times a week.    . Nutritional Supplements (FEEDING SUPPLEMENT, NEPRO CARB STEADY,) LIQD Take 237 mLs by mouth every Tuesday, Thursday, and Saturday at 6 PM.     . oxyCODONE-acetaminophen (PERCOCET) 5-325 MG tablet Take 1 tablet by mouth every 6 (six) hours as needed for severe pain. 30 tablet 0  . sevelamer (RENVELA) 800 MG tablet Take 2,400 mg by mouth See admin instructions. Take 2400 mg by mouth with meals and take 800 mg with snacks    .  NONFORMULARY OR COMPOUNDED ITEM Pharmazen compound: Nail Fungus - Complex Nail and Foot Disorders - Fluconazole 1%, DMSO 25%, Fluocinonide 0.05%, Ketoconazole 2%, apply 1-2 pumps to each affected nail beds and feet twice daily. (Patient not taking: No sig reported) 120 each 11   No current facility-administered medications for this visit.    REVIEW OF SYSTEMS:  [X]  denotes positive finding, [ ]  denotes negative finding Cardiac  Comments:  Chest pain or chest pressure:    Shortness of breath upon exertion:    Short of breath when lying flat:    Irregular heart rhythm:        Vascular    Pain in calf, thigh, or hip brought on by ambulation:    Pain in feet at night that wakes you up from your sleep:     Blood clot in your veins:    Leg swelling:         Pulmonary    Oxygen at home:    Productive cough:     Wheezing:         Neurologic    Sudden weakness in arms or legs:     Sudden numbness in arms or legs:     Sudden onset of difficulty speaking or slurred speech:    Temporary loss of vision in  one eye:     Problems with dizziness:         Gastrointestinal    Blood in stool:     Vomited blood:         Genitourinary    Burning when urinating:     Blood in urine:        Psychiatric    Major depression:         Hematologic    Bleeding problems:    Problems with blood clotting too easily:        Skin    Rashes or ulcers:        Constitutional    Fever or chills:      PHYSICAL EXAM: Vitals:   12/17/20 0813  BP: 104/65  Pulse: 84  Resp: 16  Temp: 97.9 F (36.6 C)  TempSrc: Temporal  Weight: 172 lb (78 kg)  Height: 5\' 5"  (1.651 m)    GENERAL: The patient is a well-nourished female, in no acute distress. The vital signs are documented above. CARDIAC: There is a regular rate and rhythm.  VASCULAR:  Left groin incision healing Left thigh counter-incisions x3 healing as well Left DP palpable  DATA:   None  Assessment/Plan:  75 year old female status post excision of left thigh AV graft on 12/09/2020.  This was a nonfunctioning graft where she had a draining sinus in the distal thigh.  The graft overall was well incorporated so I removed all of it except for leaving small cuffs on the artery and vein.  There was no growth of cultures in the OR when I sent the graft for gram stain and culture.  All of her incisions appear to be healing without issue.  She has a functioning right thigh graft that she is using for dialysis.  Happy with her progress.  I will have her follow-up in 2 weeks for another wound check in the PA clinic.  She knows to call with questions or concerns.   [name], MD [facility] Office: [phone]

## 2020-12-18 DIAGNOSIS — N186 End stage renal disease: Secondary | ICD-10-CM | POA: Diagnosis not present

## 2020-12-18 DIAGNOSIS — T827XXD Infection and inflammatory reaction due to other cardiac and vascular devices, implants and grafts, subsequent encounter: Secondary | ICD-10-CM | POA: Diagnosis not present

## 2020-12-18 DIAGNOSIS — I120 Hypertensive chronic kidney disease with stage 5 chronic kidney disease or end stage renal disease: Secondary | ICD-10-CM | POA: Diagnosis not present

## 2020-12-18 DIAGNOSIS — Z87891 Personal history of nicotine dependence: Secondary | ICD-10-CM | POA: Diagnosis not present

## 2020-12-18 DIAGNOSIS — Z992 Dependence on renal dialysis: Secondary | ICD-10-CM | POA: Diagnosis not present

## 2020-12-19 DIAGNOSIS — D631 Anemia in chronic kidney disease: Secondary | ICD-10-CM | POA: Diagnosis not present

## 2020-12-19 DIAGNOSIS — N186 End stage renal disease: Secondary | ICD-10-CM | POA: Diagnosis not present

## 2020-12-19 DIAGNOSIS — D259 Leiomyoma of uterus, unspecified: Secondary | ICD-10-CM | POA: Diagnosis not present

## 2020-12-19 DIAGNOSIS — Z992 Dependence on renal dialysis: Secondary | ICD-10-CM | POA: Diagnosis not present

## 2020-12-19 DIAGNOSIS — D509 Iron deficiency anemia, unspecified: Secondary | ICD-10-CM | POA: Diagnosis not present

## 2020-12-19 DIAGNOSIS — N2581 Secondary hyperparathyroidism of renal origin: Secondary | ICD-10-CM | POA: Diagnosis not present

## 2020-12-20 DIAGNOSIS — D509 Iron deficiency anemia, unspecified: Secondary | ICD-10-CM | POA: Diagnosis not present

## 2020-12-20 DIAGNOSIS — Z992 Dependence on renal dialysis: Secondary | ICD-10-CM | POA: Diagnosis not present

## 2020-12-20 DIAGNOSIS — I120 Hypertensive chronic kidney disease with stage 5 chronic kidney disease or end stage renal disease: Secondary | ICD-10-CM | POA: Diagnosis not present

## 2020-12-20 DIAGNOSIS — Z87891 Personal history of nicotine dependence: Secondary | ICD-10-CM | POA: Diagnosis not present

## 2020-12-20 DIAGNOSIS — T827XXD Infection and inflammatory reaction due to other cardiac and vascular devices, implants and grafts, subsequent encounter: Secondary | ICD-10-CM | POA: Diagnosis not present

## 2020-12-20 DIAGNOSIS — N186 End stage renal disease: Secondary | ICD-10-CM | POA: Diagnosis not present

## 2020-12-20 DIAGNOSIS — N2581 Secondary hyperparathyroidism of renal origin: Secondary | ICD-10-CM | POA: Diagnosis not present

## 2020-12-20 DIAGNOSIS — D631 Anemia in chronic kidney disease: Secondary | ICD-10-CM | POA: Diagnosis not present

## 2020-12-20 DIAGNOSIS — Q612 Polycystic kidney, adult type: Secondary | ICD-10-CM | POA: Diagnosis not present

## 2020-12-21 DIAGNOSIS — N2581 Secondary hyperparathyroidism of renal origin: Secondary | ICD-10-CM | POA: Diagnosis not present

## 2020-12-21 DIAGNOSIS — D631 Anemia in chronic kidney disease: Secondary | ICD-10-CM | POA: Diagnosis not present

## 2020-12-21 DIAGNOSIS — D509 Iron deficiency anemia, unspecified: Secondary | ICD-10-CM | POA: Diagnosis not present

## 2020-12-21 DIAGNOSIS — Z992 Dependence on renal dialysis: Secondary | ICD-10-CM | POA: Diagnosis not present

## 2020-12-21 DIAGNOSIS — N186 End stage renal disease: Secondary | ICD-10-CM | POA: Diagnosis not present

## 2020-12-24 DIAGNOSIS — Z992 Dependence on renal dialysis: Secondary | ICD-10-CM | POA: Diagnosis not present

## 2020-12-24 DIAGNOSIS — N186 End stage renal disease: Secondary | ICD-10-CM | POA: Diagnosis not present

## 2020-12-24 DIAGNOSIS — D509 Iron deficiency anemia, unspecified: Secondary | ICD-10-CM | POA: Diagnosis not present

## 2020-12-24 DIAGNOSIS — D631 Anemia in chronic kidney disease: Secondary | ICD-10-CM | POA: Diagnosis not present

## 2020-12-24 DIAGNOSIS — N2581 Secondary hyperparathyroidism of renal origin: Secondary | ICD-10-CM | POA: Diagnosis not present

## 2020-12-25 DIAGNOSIS — Z992 Dependence on renal dialysis: Secondary | ICD-10-CM | POA: Diagnosis not present

## 2020-12-25 DIAGNOSIS — Z87891 Personal history of nicotine dependence: Secondary | ICD-10-CM | POA: Diagnosis not present

## 2020-12-25 DIAGNOSIS — N186 End stage renal disease: Secondary | ICD-10-CM | POA: Diagnosis not present

## 2020-12-25 DIAGNOSIS — I120 Hypertensive chronic kidney disease with stage 5 chronic kidney disease or end stage renal disease: Secondary | ICD-10-CM | POA: Diagnosis not present

## 2020-12-25 DIAGNOSIS — T827XXD Infection and inflammatory reaction due to other cardiac and vascular devices, implants and grafts, subsequent encounter: Secondary | ICD-10-CM | POA: Diagnosis not present

## 2020-12-27 DIAGNOSIS — T827XXD Infection and inflammatory reaction due to other cardiac and vascular devices, implants and grafts, subsequent encounter: Secondary | ICD-10-CM | POA: Diagnosis not present

## 2020-12-27 DIAGNOSIS — N186 End stage renal disease: Secondary | ICD-10-CM | POA: Diagnosis not present

## 2020-12-27 DIAGNOSIS — I120 Hypertensive chronic kidney disease with stage 5 chronic kidney disease or end stage renal disease: Secondary | ICD-10-CM | POA: Diagnosis not present

## 2020-12-27 DIAGNOSIS — Z87891 Personal history of nicotine dependence: Secondary | ICD-10-CM | POA: Diagnosis not present

## 2020-12-27 DIAGNOSIS — Z992 Dependence on renal dialysis: Secondary | ICD-10-CM | POA: Diagnosis not present

## 2020-12-28 DIAGNOSIS — D631 Anemia in chronic kidney disease: Secondary | ICD-10-CM | POA: Diagnosis not present

## 2020-12-28 DIAGNOSIS — Z992 Dependence on renal dialysis: Secondary | ICD-10-CM | POA: Diagnosis not present

## 2020-12-28 DIAGNOSIS — N186 End stage renal disease: Secondary | ICD-10-CM | POA: Diagnosis not present

## 2020-12-28 DIAGNOSIS — N2581 Secondary hyperparathyroidism of renal origin: Secondary | ICD-10-CM | POA: Diagnosis not present

## 2020-12-28 DIAGNOSIS — D509 Iron deficiency anemia, unspecified: Secondary | ICD-10-CM | POA: Diagnosis not present

## 2020-12-30 DIAGNOSIS — Z87891 Personal history of nicotine dependence: Secondary | ICD-10-CM | POA: Diagnosis not present

## 2020-12-30 DIAGNOSIS — N2581 Secondary hyperparathyroidism of renal origin: Secondary | ICD-10-CM | POA: Diagnosis not present

## 2020-12-30 DIAGNOSIS — N186 End stage renal disease: Secondary | ICD-10-CM | POA: Diagnosis not present

## 2020-12-30 DIAGNOSIS — I120 Hypertensive chronic kidney disease with stage 5 chronic kidney disease or end stage renal disease: Secondary | ICD-10-CM | POA: Diagnosis not present

## 2020-12-30 DIAGNOSIS — Z992 Dependence on renal dialysis: Secondary | ICD-10-CM | POA: Diagnosis not present

## 2020-12-30 DIAGNOSIS — D631 Anemia in chronic kidney disease: Secondary | ICD-10-CM | POA: Diagnosis not present

## 2020-12-30 DIAGNOSIS — D509 Iron deficiency anemia, unspecified: Secondary | ICD-10-CM | POA: Diagnosis not present

## 2020-12-30 DIAGNOSIS — T827XXD Infection and inflammatory reaction due to other cardiac and vascular devices, implants and grafts, subsequent encounter: Secondary | ICD-10-CM | POA: Diagnosis not present

## 2020-12-31 ENCOUNTER — Telehealth: Payer: Self-pay

## 2020-12-31 DIAGNOSIS — N2581 Secondary hyperparathyroidism of renal origin: Secondary | ICD-10-CM | POA: Diagnosis not present

## 2020-12-31 DIAGNOSIS — D631 Anemia in chronic kidney disease: Secondary | ICD-10-CM | POA: Diagnosis not present

## 2020-12-31 DIAGNOSIS — Z992 Dependence on renal dialysis: Secondary | ICD-10-CM | POA: Diagnosis not present

## 2020-12-31 DIAGNOSIS — N186 End stage renal disease: Secondary | ICD-10-CM | POA: Diagnosis not present

## 2020-12-31 DIAGNOSIS — D509 Iron deficiency anemia, unspecified: Secondary | ICD-10-CM | POA: Diagnosis not present

## 2020-12-31 NOTE — Telephone Encounter (Signed)
Erica from [facility] called to report patient is doing her own dressing changes and is doing well. Will follow up with patient in APP clinic tomorrow.

## 2021-01-01 ENCOUNTER — Ambulatory Visit (INDEPENDENT_AMBULATORY_CARE_PROVIDER_SITE_OTHER): Payer: Medicare Other | Admitting: Physician Assistant

## 2021-01-01 ENCOUNTER — Other Ambulatory Visit: Payer: Self-pay

## 2021-01-01 VITALS — BP 110/56 | HR 76 | Temp 98.6°F | Resp 20 | Ht 65.0 in | Wt 168.8 lb

## 2021-01-01 DIAGNOSIS — Z992 Dependence on renal dialysis: Secondary | ICD-10-CM

## 2021-01-01 DIAGNOSIS — N186 End stage renal disease: Secondary | ICD-10-CM

## 2021-01-01 NOTE — Progress Notes (Signed)
    Postoperative Access Visit   History of Present Illness   Sue Neal is a 75 y.o. year old female who presents for postoperative follow-up for incision check after removal of left thigh AV graft.  This was performed by Dr. Carlis Abbott on 12/09/2020.  Small cuffs were left on the artery and vein.  She completed her antibiotic course and has no further signs or symptoms of infection including purulent drainage, fever, chills, nausea/vomiting.  She is dialyzing via right thigh AV graft on a Tuesday Thursday Saturday schedule without complication at the [facility].   Physical Examination   Vitals:   01/01/21 1341  BP: (!) 110/56  Pulse: 76  Resp: 20  Temp: 98.6 F (37 C)  TempSrc: Temporal  SpO2: 99%  Weight: 168 lb 12.8 oz (76.6 kg)  Height: 5\' 5"  (1.651 m)   Body mass index is 28.09 kg/m.  left leg Incisions are healed, no nonhealing wounds of bilateral lower extremities    Medical Decision Making   Sue Neal is a 75 y.o. year old female who presents s/p excision of left thigh AV graft   Left thigh and groin incisions well-healed  Continue HD via right thigh AV graft  Okay to discontinue home health services  Patient will follow up on an as-needed basis   Dagoberto Ligas PA-C [facility] [place] Office: [phone]  Clinic MD: Scot Dock

## 2021-01-02 DIAGNOSIS — D509 Iron deficiency anemia, unspecified: Secondary | ICD-10-CM | POA: Diagnosis not present

## 2021-01-02 DIAGNOSIS — N2581 Secondary hyperparathyroidism of renal origin: Secondary | ICD-10-CM | POA: Diagnosis not present

## 2021-01-02 DIAGNOSIS — Z992 Dependence on renal dialysis: Secondary | ICD-10-CM | POA: Diagnosis not present

## 2021-01-02 DIAGNOSIS — N186 End stage renal disease: Secondary | ICD-10-CM | POA: Diagnosis not present

## 2021-01-02 DIAGNOSIS — D631 Anemia in chronic kidney disease: Secondary | ICD-10-CM | POA: Diagnosis not present

## 2021-01-04 DIAGNOSIS — D509 Iron deficiency anemia, unspecified: Secondary | ICD-10-CM | POA: Diagnosis not present

## 2021-01-04 DIAGNOSIS — N2581 Secondary hyperparathyroidism of renal origin: Secondary | ICD-10-CM | POA: Diagnosis not present

## 2021-01-04 DIAGNOSIS — Z992 Dependence on renal dialysis: Secondary | ICD-10-CM | POA: Diagnosis not present

## 2021-01-04 DIAGNOSIS — N186 End stage renal disease: Secondary | ICD-10-CM | POA: Diagnosis not present

## 2021-01-04 DIAGNOSIS — D631 Anemia in chronic kidney disease: Secondary | ICD-10-CM | POA: Diagnosis not present

## 2021-01-07 DIAGNOSIS — D509 Iron deficiency anemia, unspecified: Secondary | ICD-10-CM | POA: Diagnosis not present

## 2021-01-07 DIAGNOSIS — N2581 Secondary hyperparathyroidism of renal origin: Secondary | ICD-10-CM | POA: Diagnosis not present

## 2021-01-07 DIAGNOSIS — D631 Anemia in chronic kidney disease: Secondary | ICD-10-CM | POA: Diagnosis not present

## 2021-01-07 DIAGNOSIS — N186 End stage renal disease: Secondary | ICD-10-CM | POA: Diagnosis not present

## 2021-01-07 DIAGNOSIS — Z992 Dependence on renal dialysis: Secondary | ICD-10-CM | POA: Diagnosis not present

## 2021-01-09 DIAGNOSIS — N2581 Secondary hyperparathyroidism of renal origin: Secondary | ICD-10-CM | POA: Diagnosis not present

## 2021-01-09 DIAGNOSIS — Z992 Dependence on renal dialysis: Secondary | ICD-10-CM | POA: Diagnosis not present

## 2021-01-09 DIAGNOSIS — N186 End stage renal disease: Secondary | ICD-10-CM | POA: Diagnosis not present

## 2021-01-09 DIAGNOSIS — D631 Anemia in chronic kidney disease: Secondary | ICD-10-CM | POA: Diagnosis not present

## 2021-01-09 DIAGNOSIS — D509 Iron deficiency anemia, unspecified: Secondary | ICD-10-CM | POA: Diagnosis not present

## 2021-01-11 DIAGNOSIS — D631 Anemia in chronic kidney disease: Secondary | ICD-10-CM | POA: Diagnosis not present

## 2021-01-11 DIAGNOSIS — N186 End stage renal disease: Secondary | ICD-10-CM | POA: Diagnosis not present

## 2021-01-11 DIAGNOSIS — Z992 Dependence on renal dialysis: Secondary | ICD-10-CM | POA: Diagnosis not present

## 2021-01-11 DIAGNOSIS — D509 Iron deficiency anemia, unspecified: Secondary | ICD-10-CM | POA: Diagnosis not present

## 2021-01-11 DIAGNOSIS — N2581 Secondary hyperparathyroidism of renal origin: Secondary | ICD-10-CM | POA: Diagnosis not present

## 2021-01-14 DIAGNOSIS — D509 Iron deficiency anemia, unspecified: Secondary | ICD-10-CM | POA: Diagnosis not present

## 2021-01-14 DIAGNOSIS — Z992 Dependence on renal dialysis: Secondary | ICD-10-CM | POA: Diagnosis not present

## 2021-01-14 DIAGNOSIS — N186 End stage renal disease: Secondary | ICD-10-CM | POA: Diagnosis not present

## 2021-01-14 DIAGNOSIS — D631 Anemia in chronic kidney disease: Secondary | ICD-10-CM | POA: Diagnosis not present

## 2021-01-14 DIAGNOSIS — N2581 Secondary hyperparathyroidism of renal origin: Secondary | ICD-10-CM | POA: Diagnosis not present

## 2021-01-18 DIAGNOSIS — D631 Anemia in chronic kidney disease: Secondary | ICD-10-CM | POA: Diagnosis not present

## 2021-01-18 DIAGNOSIS — N2581 Secondary hyperparathyroidism of renal origin: Secondary | ICD-10-CM | POA: Diagnosis not present

## 2021-01-18 DIAGNOSIS — D509 Iron deficiency anemia, unspecified: Secondary | ICD-10-CM | POA: Diagnosis not present

## 2021-01-18 DIAGNOSIS — N186 End stage renal disease: Secondary | ICD-10-CM | POA: Diagnosis not present

## 2021-01-18 DIAGNOSIS — Z992 Dependence on renal dialysis: Secondary | ICD-10-CM | POA: Diagnosis not present

## 2021-01-19 DIAGNOSIS — Z992 Dependence on renal dialysis: Secondary | ICD-10-CM | POA: Diagnosis not present

## 2021-01-19 DIAGNOSIS — N186 End stage renal disease: Secondary | ICD-10-CM | POA: Diagnosis not present

## 2021-01-19 DIAGNOSIS — Q612 Polycystic kidney, adult type: Secondary | ICD-10-CM | POA: Diagnosis not present

## 2021-01-21 DIAGNOSIS — N186 End stage renal disease: Secondary | ICD-10-CM | POA: Diagnosis not present

## 2021-01-21 DIAGNOSIS — Z992 Dependence on renal dialysis: Secondary | ICD-10-CM | POA: Diagnosis not present

## 2021-01-21 DIAGNOSIS — N2581 Secondary hyperparathyroidism of renal origin: Secondary | ICD-10-CM | POA: Diagnosis not present

## 2021-01-21 DIAGNOSIS — D509 Iron deficiency anemia, unspecified: Secondary | ICD-10-CM | POA: Diagnosis not present

## 2021-01-23 DIAGNOSIS — D509 Iron deficiency anemia, unspecified: Secondary | ICD-10-CM | POA: Diagnosis not present

## 2021-01-23 DIAGNOSIS — N186 End stage renal disease: Secondary | ICD-10-CM | POA: Diagnosis not present

## 2021-01-23 DIAGNOSIS — Z992 Dependence on renal dialysis: Secondary | ICD-10-CM | POA: Diagnosis not present

## 2021-01-23 DIAGNOSIS — N2581 Secondary hyperparathyroidism of renal origin: Secondary | ICD-10-CM | POA: Diagnosis not present

## 2021-01-25 DIAGNOSIS — D509 Iron deficiency anemia, unspecified: Secondary | ICD-10-CM | POA: Diagnosis not present

## 2021-01-25 DIAGNOSIS — N186 End stage renal disease: Secondary | ICD-10-CM | POA: Diagnosis not present

## 2021-01-25 DIAGNOSIS — N2581 Secondary hyperparathyroidism of renal origin: Secondary | ICD-10-CM | POA: Diagnosis not present

## 2021-01-25 DIAGNOSIS — Z992 Dependence on renal dialysis: Secondary | ICD-10-CM | POA: Diagnosis not present

## 2021-01-28 DIAGNOSIS — N2581 Secondary hyperparathyroidism of renal origin: Secondary | ICD-10-CM | POA: Diagnosis not present

## 2021-01-28 DIAGNOSIS — D509 Iron deficiency anemia, unspecified: Secondary | ICD-10-CM | POA: Diagnosis not present

## 2021-01-28 DIAGNOSIS — Z992 Dependence on renal dialysis: Secondary | ICD-10-CM | POA: Diagnosis not present

## 2021-01-28 DIAGNOSIS — N186 End stage renal disease: Secondary | ICD-10-CM | POA: Diagnosis not present

## 2021-02-01 DIAGNOSIS — Z992 Dependence on renal dialysis: Secondary | ICD-10-CM | POA: Diagnosis not present

## 2021-02-01 DIAGNOSIS — D509 Iron deficiency anemia, unspecified: Secondary | ICD-10-CM | POA: Diagnosis not present

## 2021-02-01 DIAGNOSIS — N186 End stage renal disease: Secondary | ICD-10-CM | POA: Diagnosis not present

## 2021-02-01 DIAGNOSIS — N2581 Secondary hyperparathyroidism of renal origin: Secondary | ICD-10-CM | POA: Diagnosis not present

## 2021-02-04 DIAGNOSIS — N186 End stage renal disease: Secondary | ICD-10-CM | POA: Diagnosis not present

## 2021-02-04 DIAGNOSIS — Z992 Dependence on renal dialysis: Secondary | ICD-10-CM | POA: Diagnosis not present

## 2021-02-04 DIAGNOSIS — D509 Iron deficiency anemia, unspecified: Secondary | ICD-10-CM | POA: Diagnosis not present

## 2021-02-04 DIAGNOSIS — N2581 Secondary hyperparathyroidism of renal origin: Secondary | ICD-10-CM | POA: Diagnosis not present

## 2021-02-06 DIAGNOSIS — Z992 Dependence on renal dialysis: Secondary | ICD-10-CM | POA: Diagnosis not present

## 2021-02-06 DIAGNOSIS — N2581 Secondary hyperparathyroidism of renal origin: Secondary | ICD-10-CM | POA: Diagnosis not present

## 2021-02-06 DIAGNOSIS — D509 Iron deficiency anemia, unspecified: Secondary | ICD-10-CM | POA: Diagnosis not present

## 2021-02-06 DIAGNOSIS — N186 End stage renal disease: Secondary | ICD-10-CM | POA: Diagnosis not present

## 2021-02-08 DIAGNOSIS — Z992 Dependence on renal dialysis: Secondary | ICD-10-CM | POA: Diagnosis not present

## 2021-02-08 DIAGNOSIS — N2581 Secondary hyperparathyroidism of renal origin: Secondary | ICD-10-CM | POA: Diagnosis not present

## 2021-02-08 DIAGNOSIS — D509 Iron deficiency anemia, unspecified: Secondary | ICD-10-CM | POA: Diagnosis not present

## 2021-02-08 DIAGNOSIS — N186 End stage renal disease: Secondary | ICD-10-CM | POA: Diagnosis not present

## 2021-02-11 DIAGNOSIS — N186 End stage renal disease: Secondary | ICD-10-CM | POA: Diagnosis not present

## 2021-02-11 DIAGNOSIS — Z992 Dependence on renal dialysis: Secondary | ICD-10-CM | POA: Diagnosis not present

## 2021-02-11 DIAGNOSIS — D509 Iron deficiency anemia, unspecified: Secondary | ICD-10-CM | POA: Diagnosis not present

## 2021-02-11 DIAGNOSIS — N2581 Secondary hyperparathyroidism of renal origin: Secondary | ICD-10-CM | POA: Diagnosis not present

## 2021-02-13 ENCOUNTER — Telehealth: Payer: Self-pay

## 2021-02-13 DIAGNOSIS — N2581 Secondary hyperparathyroidism of renal origin: Secondary | ICD-10-CM | POA: Diagnosis not present

## 2021-02-13 DIAGNOSIS — D509 Iron deficiency anemia, unspecified: Secondary | ICD-10-CM | POA: Diagnosis not present

## 2021-02-13 DIAGNOSIS — N186 End stage renal disease: Secondary | ICD-10-CM | POA: Diagnosis not present

## 2021-02-13 DIAGNOSIS — Z992 Dependence on renal dialysis: Secondary | ICD-10-CM | POA: Diagnosis not present

## 2021-02-13 NOTE — Telephone Encounter (Signed)
Patient is s/p left thigh AVG removal on 12/09/2020. She has been using right thigh AVG without issue. She calls today to report a "blood clot or growth" out of her left thigh where the incision was. She would like to see Carlis Abbott or Scot Dock. She denies an open wound, abscess, pain, redness, fever or chills. She noticed the area last Sunday and says she doesn't know if it is growing, but it is about the size of a quarter underneath the skin. Advised her she would need to be seen if she experienced any signs/symptoms of infection, but the site sounded okay. She is very concerned something is wrong and would like it to be examined. Placed on CSD day with PA next week.

## 2021-02-15 DIAGNOSIS — D509 Iron deficiency anemia, unspecified: Secondary | ICD-10-CM | POA: Diagnosis not present

## 2021-02-15 DIAGNOSIS — N186 End stage renal disease: Secondary | ICD-10-CM | POA: Diagnosis not present

## 2021-02-15 DIAGNOSIS — N2581 Secondary hyperparathyroidism of renal origin: Secondary | ICD-10-CM | POA: Diagnosis not present

## 2021-02-15 DIAGNOSIS — Z992 Dependence on renal dialysis: Secondary | ICD-10-CM | POA: Diagnosis not present

## 2021-02-18 DIAGNOSIS — Z992 Dependence on renal dialysis: Secondary | ICD-10-CM | POA: Diagnosis not present

## 2021-02-18 DIAGNOSIS — N2581 Secondary hyperparathyroidism of renal origin: Secondary | ICD-10-CM | POA: Diagnosis not present

## 2021-02-18 DIAGNOSIS — N186 End stage renal disease: Secondary | ICD-10-CM | POA: Diagnosis not present

## 2021-02-18 DIAGNOSIS — D509 Iron deficiency anemia, unspecified: Secondary | ICD-10-CM | POA: Diagnosis not present

## 2021-02-19 ENCOUNTER — Ambulatory Visit (INDEPENDENT_AMBULATORY_CARE_PROVIDER_SITE_OTHER): Payer: Medicare Other | Admitting: Physician Assistant

## 2021-02-19 ENCOUNTER — Other Ambulatory Visit: Payer: Self-pay

## 2021-02-19 VITALS — BP 112/56 | HR 69 | Temp 98.0°F | Resp 20 | Ht 65.0 in | Wt 166.3 lb

## 2021-02-19 DIAGNOSIS — N186 End stage renal disease: Secondary | ICD-10-CM | POA: Diagnosis not present

## 2021-02-19 DIAGNOSIS — R2242 Localized swelling, mass and lump, left lower limb: Secondary | ICD-10-CM | POA: Diagnosis not present

## 2021-02-19 DIAGNOSIS — Q612 Polycystic kidney, adult type: Secondary | ICD-10-CM | POA: Diagnosis not present

## 2021-02-19 DIAGNOSIS — Z992 Dependence on renal dialysis: Secondary | ICD-10-CM | POA: Diagnosis not present

## 2021-02-19 NOTE — Progress Notes (Signed)
POST OPERATIVE DIALYSIS ACCESS OFFICE NOTE    CC: Left thigh nodule  HPI:  This is a 75 y.o. female who is s/p explantation left thigh arteriovenous Gore-Tex graft by Dr. Carlis Abbott on December 09, 2020.  This was performed secondary to concern for an infected left thigh AV loop graft.  Prior to this, she had right thigh AV graft placed by Dr. Scot Dock on June 11, 2020.  She is dialyzing via this graft without complications on Tuesdays, Thursdays and Saturdays at the [facility] dialysis clinic on [address].  She presents today reporting a golf ball sized lump of the left inner thigh.  She denies pain, tenderness, fever or chills.  She denies any lower extremity pain or skin disturbances.   Dialysis days: Tuesday Thursday Saturday Dialysis center: [place] location  No Known Allergies  Current Outpatient Medications  Medication Sig Dispense Refill  . acetaminophen (TYLENOL) 500 MG tablet Take 500-1,000 mg by mouth every 6 (six) hours as needed for moderate pain.    . Cholecalciferol (D3 VITAMIN PO) Take by mouth.    . cinacalcet (SENSIPAR) 60 MG tablet Take 60 mg by mouth in the morning and at bedtime.    Marland Kitchen doxercalciferol (HECTOROL) 4 MCG/2ML injection Doxercalciferol (Hectorol)    . Methoxy PEG-Epoetin Beta (MIRCERA IJ) Mircera    . multivitamin (RENA-VIT) TABS tablet Take 1 tablet by mouth 2 (two) times a week.    . NONFORMULARY OR COMPOUNDED ITEM Pharmazen compound: Nail Fungus - Complex Nail and Foot Disorders - Fluconazole 1%, DMSO 25%, Fluocinonide 0.05%, Ketoconazole 2%, apply 1-2 pumps to each affected nail beds and feet twice daily. 120 each 11  . Nutritional Supplements (FEEDING SUPPLEMENT, NEPRO CARB STEADY,) LIQD Take 237 mLs by mouth every Tuesday, Thursday, and Saturday at 6 PM.     . oxyCODONE-acetaminophen (PERCOCET) 5-325 MG tablet Take 1 tablet by mouth every 6 (six) hours as needed for severe pain. 30 tablet 0  . sevelamer (RENVELA) 800 MG  tablet Take 2,400 mg by mouth See admin instructions. Take 2400 mg by mouth with meals and take 800 mg with snacks     No current facility-administered medications for this visit.     ROS:  See HPI  BP (!) 112/56 (BP Location: Left Arm, Patient Position: Sitting, Cuff Size: Large)   Pulse 69   Temp 98 F (36.7 C) (Temporal)   Resp 20   Ht 5\' 5"  (1.651 m)   Wt 166 lb 4.8 oz (75.4 kg)   LMP 02/16/2012   SpO2 99%   BMI 27.67 kg/m    Physical Exam:  General appearance: Well-developed, well-nourished in no apparent distress Cardiac: Heart rate and rhythm are regular Respiratory: Nonlabored Extremities: Left thigh: All incisions are well-healed.  There is an approximately 4 x 4 centimeter mass along the medial tract of her old graft.  This is nontender and there is no increased warmth.  Her right thigh graft incisions are well healed and she has a good bruit in the graft.  I examined this area with ultrasound at the bedside and the nodule is consistent with a fluid-filled cystic structure.  There are no internal echoes  Assessment/Plan:   Structure along the tract of her old left thigh AV graft area.  Dr. Scot Dock examined this area and agrees there are no signs of infection and would not incise or aspirate this area due to risk of infection.  Reassurance given that this should resolve with time.  Follow-up as needed  Barbie Banner, PA-C 02/19/2021 10:14 AM [facility] [phone]  Clinic MD:  Dr. Scot Dock

## 2021-02-20 DIAGNOSIS — N2581 Secondary hyperparathyroidism of renal origin: Secondary | ICD-10-CM | POA: Diagnosis not present

## 2021-02-20 DIAGNOSIS — N186 End stage renal disease: Secondary | ICD-10-CM | POA: Diagnosis not present

## 2021-02-20 DIAGNOSIS — Z992 Dependence on renal dialysis: Secondary | ICD-10-CM | POA: Diagnosis not present

## 2021-02-22 DIAGNOSIS — N186 End stage renal disease: Secondary | ICD-10-CM | POA: Diagnosis not present

## 2021-02-22 DIAGNOSIS — N2581 Secondary hyperparathyroidism of renal origin: Secondary | ICD-10-CM | POA: Diagnosis not present

## 2021-02-22 DIAGNOSIS — Z992 Dependence on renal dialysis: Secondary | ICD-10-CM | POA: Diagnosis not present

## 2021-02-25 DIAGNOSIS — N186 End stage renal disease: Secondary | ICD-10-CM | POA: Diagnosis not present

## 2021-02-25 DIAGNOSIS — N2581 Secondary hyperparathyroidism of renal origin: Secondary | ICD-10-CM | POA: Diagnosis not present

## 2021-02-25 DIAGNOSIS — Z992 Dependence on renal dialysis: Secondary | ICD-10-CM | POA: Diagnosis not present

## 2021-02-27 DIAGNOSIS — N2581 Secondary hyperparathyroidism of renal origin: Secondary | ICD-10-CM | POA: Diagnosis not present

## 2021-02-27 DIAGNOSIS — Z992 Dependence on renal dialysis: Secondary | ICD-10-CM | POA: Diagnosis not present

## 2021-02-27 DIAGNOSIS — N186 End stage renal disease: Secondary | ICD-10-CM | POA: Diagnosis not present

## 2021-03-01 DIAGNOSIS — Z992 Dependence on renal dialysis: Secondary | ICD-10-CM | POA: Diagnosis not present

## 2021-03-01 DIAGNOSIS — N2581 Secondary hyperparathyroidism of renal origin: Secondary | ICD-10-CM | POA: Diagnosis not present

## 2021-03-01 DIAGNOSIS — N186 End stage renal disease: Secondary | ICD-10-CM | POA: Diagnosis not present

## 2021-03-04 DIAGNOSIS — Z992 Dependence on renal dialysis: Secondary | ICD-10-CM | POA: Diagnosis not present

## 2021-03-04 DIAGNOSIS — N186 End stage renal disease: Secondary | ICD-10-CM | POA: Diagnosis not present

## 2021-03-04 DIAGNOSIS — N2581 Secondary hyperparathyroidism of renal origin: Secondary | ICD-10-CM | POA: Diagnosis not present

## 2021-03-06 DIAGNOSIS — Z992 Dependence on renal dialysis: Secondary | ICD-10-CM | POA: Diagnosis not present

## 2021-03-06 DIAGNOSIS — N186 End stage renal disease: Secondary | ICD-10-CM | POA: Diagnosis not present

## 2021-03-06 DIAGNOSIS — N2581 Secondary hyperparathyroidism of renal origin: Secondary | ICD-10-CM | POA: Diagnosis not present

## 2021-03-08 DIAGNOSIS — Z992 Dependence on renal dialysis: Secondary | ICD-10-CM | POA: Diagnosis not present

## 2021-03-08 DIAGNOSIS — N2581 Secondary hyperparathyroidism of renal origin: Secondary | ICD-10-CM | POA: Diagnosis not present

## 2021-03-08 DIAGNOSIS — N186 End stage renal disease: Secondary | ICD-10-CM | POA: Diagnosis not present

## 2021-03-11 DIAGNOSIS — N186 End stage renal disease: Secondary | ICD-10-CM | POA: Diagnosis not present

## 2021-03-11 DIAGNOSIS — Z992 Dependence on renal dialysis: Secondary | ICD-10-CM | POA: Diagnosis not present

## 2021-03-11 DIAGNOSIS — N2581 Secondary hyperparathyroidism of renal origin: Secondary | ICD-10-CM | POA: Diagnosis not present

## 2021-03-13 DIAGNOSIS — N2581 Secondary hyperparathyroidism of renal origin: Secondary | ICD-10-CM | POA: Diagnosis not present

## 2021-03-13 DIAGNOSIS — N186 End stage renal disease: Secondary | ICD-10-CM | POA: Diagnosis not present

## 2021-03-13 DIAGNOSIS — Z992 Dependence on renal dialysis: Secondary | ICD-10-CM | POA: Diagnosis not present

## 2021-03-15 DIAGNOSIS — Z992 Dependence on renal dialysis: Secondary | ICD-10-CM | POA: Diagnosis not present

## 2021-03-15 DIAGNOSIS — N2581 Secondary hyperparathyroidism of renal origin: Secondary | ICD-10-CM | POA: Diagnosis not present

## 2021-03-15 DIAGNOSIS — N186 End stage renal disease: Secondary | ICD-10-CM | POA: Diagnosis not present

## 2021-03-18 DIAGNOSIS — N2581 Secondary hyperparathyroidism of renal origin: Secondary | ICD-10-CM | POA: Diagnosis not present

## 2021-03-18 DIAGNOSIS — Z992 Dependence on renal dialysis: Secondary | ICD-10-CM | POA: Diagnosis not present

## 2021-03-18 DIAGNOSIS — N186 End stage renal disease: Secondary | ICD-10-CM | POA: Diagnosis not present

## 2021-03-21 DIAGNOSIS — N186 End stage renal disease: Secondary | ICD-10-CM | POA: Diagnosis not present

## 2021-03-21 DIAGNOSIS — Z992 Dependence on renal dialysis: Secondary | ICD-10-CM | POA: Diagnosis not present

## 2021-03-21 DIAGNOSIS — Q612 Polycystic kidney, adult type: Secondary | ICD-10-CM | POA: Diagnosis not present

## 2021-03-22 DIAGNOSIS — N2581 Secondary hyperparathyroidism of renal origin: Secondary | ICD-10-CM | POA: Diagnosis not present

## 2021-03-22 DIAGNOSIS — Z992 Dependence on renal dialysis: Secondary | ICD-10-CM | POA: Diagnosis not present

## 2021-03-22 DIAGNOSIS — N186 End stage renal disease: Secondary | ICD-10-CM | POA: Diagnosis not present

## 2021-03-25 DIAGNOSIS — N2581 Secondary hyperparathyroidism of renal origin: Secondary | ICD-10-CM | POA: Diagnosis not present

## 2021-03-25 DIAGNOSIS — N186 End stage renal disease: Secondary | ICD-10-CM | POA: Diagnosis not present

## 2021-03-25 DIAGNOSIS — Z992 Dependence on renal dialysis: Secondary | ICD-10-CM | POA: Diagnosis not present

## 2021-03-29 DIAGNOSIS — Z992 Dependence on renal dialysis: Secondary | ICD-10-CM | POA: Diagnosis not present

## 2021-03-29 DIAGNOSIS — N186 End stage renal disease: Secondary | ICD-10-CM | POA: Diagnosis not present

## 2021-03-29 DIAGNOSIS — N2581 Secondary hyperparathyroidism of renal origin: Secondary | ICD-10-CM | POA: Diagnosis not present

## 2021-03-31 DIAGNOSIS — N186 End stage renal disease: Secondary | ICD-10-CM | POA: Diagnosis not present

## 2021-03-31 DIAGNOSIS — I871 Compression of vein: Secondary | ICD-10-CM | POA: Diagnosis not present

## 2021-03-31 DIAGNOSIS — Z992 Dependence on renal dialysis: Secondary | ICD-10-CM | POA: Diagnosis not present

## 2021-04-01 DIAGNOSIS — Z992 Dependence on renal dialysis: Secondary | ICD-10-CM | POA: Diagnosis not present

## 2021-04-01 DIAGNOSIS — N186 End stage renal disease: Secondary | ICD-10-CM | POA: Diagnosis not present

## 2021-04-01 DIAGNOSIS — N2581 Secondary hyperparathyroidism of renal origin: Secondary | ICD-10-CM | POA: Diagnosis not present

## 2021-04-03 DIAGNOSIS — N2581 Secondary hyperparathyroidism of renal origin: Secondary | ICD-10-CM | POA: Diagnosis not present

## 2021-04-03 DIAGNOSIS — Z992 Dependence on renal dialysis: Secondary | ICD-10-CM | POA: Diagnosis not present

## 2021-04-03 DIAGNOSIS — N186 End stage renal disease: Secondary | ICD-10-CM | POA: Diagnosis not present

## 2021-04-05 DIAGNOSIS — N2581 Secondary hyperparathyroidism of renal origin: Secondary | ICD-10-CM | POA: Diagnosis not present

## 2021-04-05 DIAGNOSIS — N186 End stage renal disease: Secondary | ICD-10-CM | POA: Diagnosis not present

## 2021-04-05 DIAGNOSIS — Z992 Dependence on renal dialysis: Secondary | ICD-10-CM | POA: Diagnosis not present

## 2021-04-08 DIAGNOSIS — N186 End stage renal disease: Secondary | ICD-10-CM | POA: Diagnosis not present

## 2021-04-08 DIAGNOSIS — N2581 Secondary hyperparathyroidism of renal origin: Secondary | ICD-10-CM | POA: Diagnosis not present

## 2021-04-08 DIAGNOSIS — Z992 Dependence on renal dialysis: Secondary | ICD-10-CM | POA: Diagnosis not present

## 2021-04-12 DIAGNOSIS — Z992 Dependence on renal dialysis: Secondary | ICD-10-CM | POA: Diagnosis not present

## 2021-04-12 DIAGNOSIS — N186 End stage renal disease: Secondary | ICD-10-CM | POA: Diagnosis not present

## 2021-04-12 DIAGNOSIS — N2581 Secondary hyperparathyroidism of renal origin: Secondary | ICD-10-CM | POA: Diagnosis not present

## 2021-04-17 DIAGNOSIS — Z992 Dependence on renal dialysis: Secondary | ICD-10-CM | POA: Diagnosis not present

## 2021-04-17 DIAGNOSIS — N186 End stage renal disease: Secondary | ICD-10-CM | POA: Diagnosis not present

## 2021-04-17 DIAGNOSIS — N2581 Secondary hyperparathyroidism of renal origin: Secondary | ICD-10-CM | POA: Diagnosis not present

## 2021-04-19 DIAGNOSIS — Z992 Dependence on renal dialysis: Secondary | ICD-10-CM | POA: Diagnosis not present

## 2021-04-19 DIAGNOSIS — N2581 Secondary hyperparathyroidism of renal origin: Secondary | ICD-10-CM | POA: Diagnosis not present

## 2021-04-19 DIAGNOSIS — N186 End stage renal disease: Secondary | ICD-10-CM | POA: Diagnosis not present

## 2021-04-21 DIAGNOSIS — Q612 Polycystic kidney, adult type: Secondary | ICD-10-CM | POA: Diagnosis not present

## 2021-04-21 DIAGNOSIS — N186 End stage renal disease: Secondary | ICD-10-CM | POA: Diagnosis not present

## 2021-04-21 DIAGNOSIS — Z992 Dependence on renal dialysis: Secondary | ICD-10-CM | POA: Diagnosis not present

## 2021-04-22 DIAGNOSIS — N186 End stage renal disease: Secondary | ICD-10-CM | POA: Diagnosis not present

## 2021-04-22 DIAGNOSIS — N2581 Secondary hyperparathyroidism of renal origin: Secondary | ICD-10-CM | POA: Diagnosis not present

## 2021-04-22 DIAGNOSIS — Z992 Dependence on renal dialysis: Secondary | ICD-10-CM | POA: Diagnosis not present

## 2021-04-26 DIAGNOSIS — Z992 Dependence on renal dialysis: Secondary | ICD-10-CM | POA: Diagnosis not present

## 2021-04-26 DIAGNOSIS — N2581 Secondary hyperparathyroidism of renal origin: Secondary | ICD-10-CM | POA: Diagnosis not present

## 2021-04-26 DIAGNOSIS — N186 End stage renal disease: Secondary | ICD-10-CM | POA: Diagnosis not present

## 2021-04-29 DIAGNOSIS — N2581 Secondary hyperparathyroidism of renal origin: Secondary | ICD-10-CM | POA: Diagnosis not present

## 2021-04-29 DIAGNOSIS — Z992 Dependence on renal dialysis: Secondary | ICD-10-CM | POA: Diagnosis not present

## 2021-04-29 DIAGNOSIS — N186 End stage renal disease: Secondary | ICD-10-CM | POA: Diagnosis not present

## 2021-05-01 DIAGNOSIS — Z992 Dependence on renal dialysis: Secondary | ICD-10-CM | POA: Diagnosis not present

## 2021-05-01 DIAGNOSIS — N186 End stage renal disease: Secondary | ICD-10-CM | POA: Diagnosis not present

## 2021-05-01 DIAGNOSIS — N2581 Secondary hyperparathyroidism of renal origin: Secondary | ICD-10-CM | POA: Diagnosis not present

## 2021-05-03 DIAGNOSIS — N186 End stage renal disease: Secondary | ICD-10-CM | POA: Diagnosis not present

## 2021-05-03 DIAGNOSIS — Z992 Dependence on renal dialysis: Secondary | ICD-10-CM | POA: Diagnosis not present

## 2021-05-03 DIAGNOSIS — N2581 Secondary hyperparathyroidism of renal origin: Secondary | ICD-10-CM | POA: Diagnosis not present

## 2021-05-06 DIAGNOSIS — Z992 Dependence on renal dialysis: Secondary | ICD-10-CM | POA: Diagnosis not present

## 2021-05-06 DIAGNOSIS — N2581 Secondary hyperparathyroidism of renal origin: Secondary | ICD-10-CM | POA: Diagnosis not present

## 2021-05-06 DIAGNOSIS — N186 End stage renal disease: Secondary | ICD-10-CM | POA: Diagnosis not present

## 2021-05-08 DIAGNOSIS — Z992 Dependence on renal dialysis: Secondary | ICD-10-CM | POA: Diagnosis not present

## 2021-05-08 DIAGNOSIS — N186 End stage renal disease: Secondary | ICD-10-CM | POA: Diagnosis not present

## 2021-05-08 DIAGNOSIS — N2581 Secondary hyperparathyroidism of renal origin: Secondary | ICD-10-CM | POA: Diagnosis not present

## 2021-05-10 DIAGNOSIS — Z992 Dependence on renal dialysis: Secondary | ICD-10-CM | POA: Diagnosis not present

## 2021-05-10 DIAGNOSIS — N2581 Secondary hyperparathyroidism of renal origin: Secondary | ICD-10-CM | POA: Diagnosis not present

## 2021-05-10 DIAGNOSIS — N186 End stage renal disease: Secondary | ICD-10-CM | POA: Diagnosis not present

## 2021-05-12 DIAGNOSIS — N2581 Secondary hyperparathyroidism of renal origin: Secondary | ICD-10-CM | POA: Diagnosis not present

## 2021-05-12 DIAGNOSIS — N186 End stage renal disease: Secondary | ICD-10-CM | POA: Diagnosis not present

## 2021-05-12 DIAGNOSIS — Z992 Dependence on renal dialysis: Secondary | ICD-10-CM | POA: Diagnosis not present

## 2021-05-15 DIAGNOSIS — Z992 Dependence on renal dialysis: Secondary | ICD-10-CM | POA: Diagnosis not present

## 2021-05-15 DIAGNOSIS — N2581 Secondary hyperparathyroidism of renal origin: Secondary | ICD-10-CM | POA: Diagnosis not present

## 2021-05-15 DIAGNOSIS — N186 End stage renal disease: Secondary | ICD-10-CM | POA: Diagnosis not present

## 2021-05-17 DIAGNOSIS — N186 End stage renal disease: Secondary | ICD-10-CM | POA: Diagnosis not present

## 2021-05-17 DIAGNOSIS — Z992 Dependence on renal dialysis: Secondary | ICD-10-CM | POA: Diagnosis not present

## 2021-05-17 DIAGNOSIS — N2581 Secondary hyperparathyroidism of renal origin: Secondary | ICD-10-CM | POA: Diagnosis not present

## 2021-05-20 DIAGNOSIS — Z992 Dependence on renal dialysis: Secondary | ICD-10-CM | POA: Diagnosis not present

## 2021-05-20 DIAGNOSIS — N186 End stage renal disease: Secondary | ICD-10-CM | POA: Diagnosis not present

## 2021-05-20 DIAGNOSIS — N2581 Secondary hyperparathyroidism of renal origin: Secondary | ICD-10-CM | POA: Diagnosis not present

## 2021-05-22 DIAGNOSIS — D509 Iron deficiency anemia, unspecified: Secondary | ICD-10-CM | POA: Diagnosis not present

## 2021-05-22 DIAGNOSIS — Q612 Polycystic kidney, adult type: Secondary | ICD-10-CM | POA: Diagnosis not present

## 2021-05-22 DIAGNOSIS — N186 End stage renal disease: Secondary | ICD-10-CM | POA: Diagnosis not present

## 2021-05-22 DIAGNOSIS — N2581 Secondary hyperparathyroidism of renal origin: Secondary | ICD-10-CM | POA: Diagnosis not present

## 2021-05-22 DIAGNOSIS — Z992 Dependence on renal dialysis: Secondary | ICD-10-CM | POA: Diagnosis not present

## 2021-05-24 DIAGNOSIS — Z992 Dependence on renal dialysis: Secondary | ICD-10-CM | POA: Diagnosis not present

## 2021-05-24 DIAGNOSIS — D509 Iron deficiency anemia, unspecified: Secondary | ICD-10-CM | POA: Diagnosis not present

## 2021-05-24 DIAGNOSIS — N2581 Secondary hyperparathyroidism of renal origin: Secondary | ICD-10-CM | POA: Diagnosis not present

## 2021-05-24 DIAGNOSIS — N186 End stage renal disease: Secondary | ICD-10-CM | POA: Diagnosis not present

## 2021-05-27 DIAGNOSIS — N186 End stage renal disease: Secondary | ICD-10-CM | POA: Diagnosis not present

## 2021-05-27 DIAGNOSIS — Z992 Dependence on renal dialysis: Secondary | ICD-10-CM | POA: Diagnosis not present

## 2021-05-27 DIAGNOSIS — D509 Iron deficiency anemia, unspecified: Secondary | ICD-10-CM | POA: Diagnosis not present

## 2021-05-27 DIAGNOSIS — N2581 Secondary hyperparathyroidism of renal origin: Secondary | ICD-10-CM | POA: Diagnosis not present

## 2021-05-31 DIAGNOSIS — Z992 Dependence on renal dialysis: Secondary | ICD-10-CM | POA: Diagnosis not present

## 2021-05-31 DIAGNOSIS — N186 End stage renal disease: Secondary | ICD-10-CM | POA: Diagnosis not present

## 2021-05-31 DIAGNOSIS — D509 Iron deficiency anemia, unspecified: Secondary | ICD-10-CM | POA: Diagnosis not present

## 2021-05-31 DIAGNOSIS — N2581 Secondary hyperparathyroidism of renal origin: Secondary | ICD-10-CM | POA: Diagnosis not present

## 2021-06-03 DIAGNOSIS — Z992 Dependence on renal dialysis: Secondary | ICD-10-CM | POA: Diagnosis not present

## 2021-06-03 DIAGNOSIS — D509 Iron deficiency anemia, unspecified: Secondary | ICD-10-CM | POA: Diagnosis not present

## 2021-06-03 DIAGNOSIS — N2581 Secondary hyperparathyroidism of renal origin: Secondary | ICD-10-CM | POA: Diagnosis not present

## 2021-06-03 DIAGNOSIS — N186 End stage renal disease: Secondary | ICD-10-CM | POA: Diagnosis not present

## 2021-06-04 DIAGNOSIS — H25813 Combined forms of age-related cataract, bilateral: Secondary | ICD-10-CM | POA: Diagnosis not present

## 2021-06-04 DIAGNOSIS — H1045 Other chronic allergic conjunctivitis: Secondary | ICD-10-CM | POA: Diagnosis not present

## 2021-06-04 DIAGNOSIS — H04123 Dry eye syndrome of bilateral lacrimal glands: Secondary | ICD-10-CM | POA: Diagnosis not present

## 2021-06-05 DIAGNOSIS — N186 End stage renal disease: Secondary | ICD-10-CM | POA: Diagnosis not present

## 2021-06-05 DIAGNOSIS — D509 Iron deficiency anemia, unspecified: Secondary | ICD-10-CM | POA: Diagnosis not present

## 2021-06-05 DIAGNOSIS — Z992 Dependence on renal dialysis: Secondary | ICD-10-CM | POA: Diagnosis not present

## 2021-06-05 DIAGNOSIS — N2581 Secondary hyperparathyroidism of renal origin: Secondary | ICD-10-CM | POA: Diagnosis not present

## 2021-06-07 DIAGNOSIS — N2581 Secondary hyperparathyroidism of renal origin: Secondary | ICD-10-CM | POA: Diagnosis not present

## 2021-06-07 DIAGNOSIS — N186 End stage renal disease: Secondary | ICD-10-CM | POA: Diagnosis not present

## 2021-06-07 DIAGNOSIS — D509 Iron deficiency anemia, unspecified: Secondary | ICD-10-CM | POA: Diagnosis not present

## 2021-06-07 DIAGNOSIS — Z992 Dependence on renal dialysis: Secondary | ICD-10-CM | POA: Diagnosis not present

## 2021-06-12 DIAGNOSIS — N2581 Secondary hyperparathyroidism of renal origin: Secondary | ICD-10-CM | POA: Diagnosis not present

## 2021-06-12 DIAGNOSIS — Z992 Dependence on renal dialysis: Secondary | ICD-10-CM | POA: Diagnosis not present

## 2021-06-12 DIAGNOSIS — N186 End stage renal disease: Secondary | ICD-10-CM | POA: Diagnosis not present

## 2021-06-12 DIAGNOSIS — D509 Iron deficiency anemia, unspecified: Secondary | ICD-10-CM | POA: Diagnosis not present

## 2021-06-14 DIAGNOSIS — N186 End stage renal disease: Secondary | ICD-10-CM | POA: Diagnosis not present

## 2021-06-14 DIAGNOSIS — Z992 Dependence on renal dialysis: Secondary | ICD-10-CM | POA: Diagnosis not present

## 2021-06-14 DIAGNOSIS — D509 Iron deficiency anemia, unspecified: Secondary | ICD-10-CM | POA: Diagnosis not present

## 2021-06-14 DIAGNOSIS — N2581 Secondary hyperparathyroidism of renal origin: Secondary | ICD-10-CM | POA: Diagnosis not present

## 2021-06-17 DIAGNOSIS — N2581 Secondary hyperparathyroidism of renal origin: Secondary | ICD-10-CM | POA: Diagnosis not present

## 2021-06-17 DIAGNOSIS — D509 Iron deficiency anemia, unspecified: Secondary | ICD-10-CM | POA: Diagnosis not present

## 2021-06-17 DIAGNOSIS — Z992 Dependence on renal dialysis: Secondary | ICD-10-CM | POA: Diagnosis not present

## 2021-06-17 DIAGNOSIS — N186 End stage renal disease: Secondary | ICD-10-CM | POA: Diagnosis not present

## 2021-06-19 DIAGNOSIS — D509 Iron deficiency anemia, unspecified: Secondary | ICD-10-CM | POA: Diagnosis not present

## 2021-06-19 DIAGNOSIS — N2581 Secondary hyperparathyroidism of renal origin: Secondary | ICD-10-CM | POA: Diagnosis not present

## 2021-06-19 DIAGNOSIS — N186 End stage renal disease: Secondary | ICD-10-CM | POA: Diagnosis not present

## 2021-06-19 DIAGNOSIS — Z992 Dependence on renal dialysis: Secondary | ICD-10-CM | POA: Diagnosis not present

## 2021-06-21 DIAGNOSIS — D509 Iron deficiency anemia, unspecified: Secondary | ICD-10-CM | POA: Diagnosis not present

## 2021-06-21 DIAGNOSIS — N186 End stage renal disease: Secondary | ICD-10-CM | POA: Diagnosis not present

## 2021-06-21 DIAGNOSIS — Z992 Dependence on renal dialysis: Secondary | ICD-10-CM | POA: Diagnosis not present

## 2021-06-21 DIAGNOSIS — N2581 Secondary hyperparathyroidism of renal origin: Secondary | ICD-10-CM | POA: Diagnosis not present

## 2021-06-21 DIAGNOSIS — Q612 Polycystic kidney, adult type: Secondary | ICD-10-CM | POA: Diagnosis not present

## 2021-06-24 DIAGNOSIS — Z992 Dependence on renal dialysis: Secondary | ICD-10-CM | POA: Diagnosis not present

## 2021-06-24 DIAGNOSIS — N186 End stage renal disease: Secondary | ICD-10-CM | POA: Diagnosis not present

## 2021-06-24 DIAGNOSIS — N2581 Secondary hyperparathyroidism of renal origin: Secondary | ICD-10-CM | POA: Diagnosis not present

## 2021-06-24 DIAGNOSIS — D509 Iron deficiency anemia, unspecified: Secondary | ICD-10-CM | POA: Diagnosis not present

## 2021-06-25 DIAGNOSIS — N186 End stage renal disease: Secondary | ICD-10-CM | POA: Diagnosis not present

## 2021-06-25 DIAGNOSIS — T82858A Stenosis of vascular prosthetic devices, implants and grafts, initial encounter: Secondary | ICD-10-CM | POA: Diagnosis not present

## 2021-06-25 DIAGNOSIS — Z992 Dependence on renal dialysis: Secondary | ICD-10-CM | POA: Diagnosis not present

## 2021-06-25 DIAGNOSIS — I871 Compression of vein: Secondary | ICD-10-CM | POA: Diagnosis not present

## 2021-06-26 DIAGNOSIS — Z992 Dependence on renal dialysis: Secondary | ICD-10-CM | POA: Diagnosis not present

## 2021-06-26 DIAGNOSIS — D509 Iron deficiency anemia, unspecified: Secondary | ICD-10-CM | POA: Diagnosis not present

## 2021-06-26 DIAGNOSIS — N186 End stage renal disease: Secondary | ICD-10-CM | POA: Diagnosis not present

## 2021-06-26 DIAGNOSIS — N2581 Secondary hyperparathyroidism of renal origin: Secondary | ICD-10-CM | POA: Diagnosis not present

## 2021-07-01 DIAGNOSIS — N2581 Secondary hyperparathyroidism of renal origin: Secondary | ICD-10-CM | POA: Diagnosis not present

## 2021-07-01 DIAGNOSIS — N186 End stage renal disease: Secondary | ICD-10-CM | POA: Diagnosis not present

## 2021-07-01 DIAGNOSIS — D509 Iron deficiency anemia, unspecified: Secondary | ICD-10-CM | POA: Diagnosis not present

## 2021-07-01 DIAGNOSIS — Z992 Dependence on renal dialysis: Secondary | ICD-10-CM | POA: Diagnosis not present

## 2021-07-03 DIAGNOSIS — Z992 Dependence on renal dialysis: Secondary | ICD-10-CM | POA: Diagnosis not present

## 2021-07-03 DIAGNOSIS — D509 Iron deficiency anemia, unspecified: Secondary | ICD-10-CM | POA: Diagnosis not present

## 2021-07-03 DIAGNOSIS — N186 End stage renal disease: Secondary | ICD-10-CM | POA: Diagnosis not present

## 2021-07-03 DIAGNOSIS — N2581 Secondary hyperparathyroidism of renal origin: Secondary | ICD-10-CM | POA: Diagnosis not present

## 2021-07-05 DIAGNOSIS — N186 End stage renal disease: Secondary | ICD-10-CM | POA: Diagnosis not present

## 2021-07-05 DIAGNOSIS — D509 Iron deficiency anemia, unspecified: Secondary | ICD-10-CM | POA: Diagnosis not present

## 2021-07-05 DIAGNOSIS — Z992 Dependence on renal dialysis: Secondary | ICD-10-CM | POA: Diagnosis not present

## 2021-07-05 DIAGNOSIS — N2581 Secondary hyperparathyroidism of renal origin: Secondary | ICD-10-CM | POA: Diagnosis not present

## 2021-07-08 DIAGNOSIS — D509 Iron deficiency anemia, unspecified: Secondary | ICD-10-CM | POA: Diagnosis not present

## 2021-07-08 DIAGNOSIS — N186 End stage renal disease: Secondary | ICD-10-CM | POA: Diagnosis not present

## 2021-07-08 DIAGNOSIS — N2581 Secondary hyperparathyroidism of renal origin: Secondary | ICD-10-CM | POA: Diagnosis not present

## 2021-07-08 DIAGNOSIS — Z992 Dependence on renal dialysis: Secondary | ICD-10-CM | POA: Diagnosis not present

## 2021-07-12 DIAGNOSIS — Z992 Dependence on renal dialysis: Secondary | ICD-10-CM | POA: Diagnosis not present

## 2021-07-12 DIAGNOSIS — N2581 Secondary hyperparathyroidism of renal origin: Secondary | ICD-10-CM | POA: Diagnosis not present

## 2021-07-12 DIAGNOSIS — D509 Iron deficiency anemia, unspecified: Secondary | ICD-10-CM | POA: Diagnosis not present

## 2021-07-12 DIAGNOSIS — N186 End stage renal disease: Secondary | ICD-10-CM | POA: Diagnosis not present

## 2021-07-15 DIAGNOSIS — N2581 Secondary hyperparathyroidism of renal origin: Secondary | ICD-10-CM | POA: Diagnosis not present

## 2021-07-15 DIAGNOSIS — Z992 Dependence on renal dialysis: Secondary | ICD-10-CM | POA: Diagnosis not present

## 2021-07-15 DIAGNOSIS — D509 Iron deficiency anemia, unspecified: Secondary | ICD-10-CM | POA: Diagnosis not present

## 2021-07-15 DIAGNOSIS — N186 End stage renal disease: Secondary | ICD-10-CM | POA: Diagnosis not present

## 2021-07-17 DIAGNOSIS — N2581 Secondary hyperparathyroidism of renal origin: Secondary | ICD-10-CM | POA: Diagnosis not present

## 2021-07-17 DIAGNOSIS — Z992 Dependence on renal dialysis: Secondary | ICD-10-CM | POA: Diagnosis not present

## 2021-07-17 DIAGNOSIS — D509 Iron deficiency anemia, unspecified: Secondary | ICD-10-CM | POA: Diagnosis not present

## 2021-07-17 DIAGNOSIS — N186 End stage renal disease: Secondary | ICD-10-CM | POA: Diagnosis not present

## 2021-07-19 DIAGNOSIS — Z992 Dependence on renal dialysis: Secondary | ICD-10-CM | POA: Diagnosis not present

## 2021-07-19 DIAGNOSIS — N186 End stage renal disease: Secondary | ICD-10-CM | POA: Diagnosis not present

## 2021-07-19 DIAGNOSIS — D509 Iron deficiency anemia, unspecified: Secondary | ICD-10-CM | POA: Diagnosis not present

## 2021-07-19 DIAGNOSIS — N2581 Secondary hyperparathyroidism of renal origin: Secondary | ICD-10-CM | POA: Diagnosis not present

## 2021-07-22 DIAGNOSIS — N186 End stage renal disease: Secondary | ICD-10-CM | POA: Diagnosis not present

## 2021-07-22 DIAGNOSIS — Q612 Polycystic kidney, adult type: Secondary | ICD-10-CM | POA: Diagnosis not present

## 2021-07-22 DIAGNOSIS — N2581 Secondary hyperparathyroidism of renal origin: Secondary | ICD-10-CM | POA: Diagnosis not present

## 2021-07-22 DIAGNOSIS — Z992 Dependence on renal dialysis: Secondary | ICD-10-CM | POA: Diagnosis not present

## 2021-07-26 DIAGNOSIS — N186 End stage renal disease: Secondary | ICD-10-CM | POA: Diagnosis not present

## 2021-07-26 DIAGNOSIS — Z992 Dependence on renal dialysis: Secondary | ICD-10-CM | POA: Diagnosis not present

## 2021-07-26 DIAGNOSIS — N2581 Secondary hyperparathyroidism of renal origin: Secondary | ICD-10-CM | POA: Diagnosis not present

## 2021-07-29 DIAGNOSIS — N2581 Secondary hyperparathyroidism of renal origin: Secondary | ICD-10-CM | POA: Diagnosis not present

## 2021-07-29 DIAGNOSIS — N186 End stage renal disease: Secondary | ICD-10-CM | POA: Diagnosis not present

## 2021-07-29 DIAGNOSIS — Z992 Dependence on renal dialysis: Secondary | ICD-10-CM | POA: Diagnosis not present

## 2021-07-31 DIAGNOSIS — N186 End stage renal disease: Secondary | ICD-10-CM | POA: Diagnosis not present

## 2021-07-31 DIAGNOSIS — Z992 Dependence on renal dialysis: Secondary | ICD-10-CM | POA: Diagnosis not present

## 2021-07-31 DIAGNOSIS — N2581 Secondary hyperparathyroidism of renal origin: Secondary | ICD-10-CM | POA: Diagnosis not present

## 2021-08-02 DIAGNOSIS — N186 End stage renal disease: Secondary | ICD-10-CM | POA: Diagnosis not present

## 2021-08-02 DIAGNOSIS — N2581 Secondary hyperparathyroidism of renal origin: Secondary | ICD-10-CM | POA: Diagnosis not present

## 2021-08-02 DIAGNOSIS — Z992 Dependence on renal dialysis: Secondary | ICD-10-CM | POA: Diagnosis not present

## 2021-08-05 DIAGNOSIS — N186 End stage renal disease: Secondary | ICD-10-CM | POA: Diagnosis not present

## 2021-08-05 DIAGNOSIS — N2581 Secondary hyperparathyroidism of renal origin: Secondary | ICD-10-CM | POA: Diagnosis not present

## 2021-08-05 DIAGNOSIS — Z992 Dependence on renal dialysis: Secondary | ICD-10-CM | POA: Diagnosis not present

## 2021-08-07 DIAGNOSIS — Z992 Dependence on renal dialysis: Secondary | ICD-10-CM | POA: Diagnosis not present

## 2021-08-07 DIAGNOSIS — N2581 Secondary hyperparathyroidism of renal origin: Secondary | ICD-10-CM | POA: Diagnosis not present

## 2021-08-07 DIAGNOSIS — N186 End stage renal disease: Secondary | ICD-10-CM | POA: Diagnosis not present

## 2021-08-09 DIAGNOSIS — N2581 Secondary hyperparathyroidism of renal origin: Secondary | ICD-10-CM | POA: Diagnosis not present

## 2021-08-09 DIAGNOSIS — N186 End stage renal disease: Secondary | ICD-10-CM | POA: Diagnosis not present

## 2021-08-09 DIAGNOSIS — Z992 Dependence on renal dialysis: Secondary | ICD-10-CM | POA: Diagnosis not present

## 2021-08-12 DIAGNOSIS — N2581 Secondary hyperparathyroidism of renal origin: Secondary | ICD-10-CM | POA: Diagnosis not present

## 2021-08-12 DIAGNOSIS — N186 End stage renal disease: Secondary | ICD-10-CM | POA: Diagnosis not present

## 2021-08-12 DIAGNOSIS — Z992 Dependence on renal dialysis: Secondary | ICD-10-CM | POA: Diagnosis not present

## 2021-08-15 DIAGNOSIS — N186 End stage renal disease: Secondary | ICD-10-CM | POA: Diagnosis not present

## 2021-08-15 DIAGNOSIS — Z992 Dependence on renal dialysis: Secondary | ICD-10-CM | POA: Diagnosis not present

## 2021-08-15 DIAGNOSIS — N2581 Secondary hyperparathyroidism of renal origin: Secondary | ICD-10-CM | POA: Diagnosis not present

## 2021-08-19 DIAGNOSIS — N186 End stage renal disease: Secondary | ICD-10-CM | POA: Diagnosis not present

## 2021-08-19 DIAGNOSIS — N2581 Secondary hyperparathyroidism of renal origin: Secondary | ICD-10-CM | POA: Diagnosis not present

## 2021-08-19 DIAGNOSIS — Z992 Dependence on renal dialysis: Secondary | ICD-10-CM | POA: Diagnosis not present

## 2021-08-21 DIAGNOSIS — N186 End stage renal disease: Secondary | ICD-10-CM | POA: Diagnosis not present

## 2021-08-21 DIAGNOSIS — Q612 Polycystic kidney, adult type: Secondary | ICD-10-CM | POA: Diagnosis not present

## 2021-08-21 DIAGNOSIS — Z992 Dependence on renal dialysis: Secondary | ICD-10-CM | POA: Diagnosis not present

## 2021-08-21 DIAGNOSIS — N2581 Secondary hyperparathyroidism of renal origin: Secondary | ICD-10-CM | POA: Diagnosis not present

## 2021-08-23 DIAGNOSIS — Z992 Dependence on renal dialysis: Secondary | ICD-10-CM | POA: Diagnosis not present

## 2021-08-23 DIAGNOSIS — N186 End stage renal disease: Secondary | ICD-10-CM | POA: Diagnosis not present

## 2021-08-23 DIAGNOSIS — N2581 Secondary hyperparathyroidism of renal origin: Secondary | ICD-10-CM | POA: Diagnosis not present

## 2021-08-26 DIAGNOSIS — Z992 Dependence on renal dialysis: Secondary | ICD-10-CM | POA: Diagnosis not present

## 2021-08-26 DIAGNOSIS — N2581 Secondary hyperparathyroidism of renal origin: Secondary | ICD-10-CM | POA: Diagnosis not present

## 2021-08-26 DIAGNOSIS — N186 End stage renal disease: Secondary | ICD-10-CM | POA: Diagnosis not present

## 2021-08-28 DIAGNOSIS — Z992 Dependence on renal dialysis: Secondary | ICD-10-CM | POA: Diagnosis not present

## 2021-08-28 DIAGNOSIS — N186 End stage renal disease: Secondary | ICD-10-CM | POA: Diagnosis not present

## 2021-08-28 DIAGNOSIS — N2581 Secondary hyperparathyroidism of renal origin: Secondary | ICD-10-CM | POA: Diagnosis not present

## 2021-08-30 DIAGNOSIS — N186 End stage renal disease: Secondary | ICD-10-CM | POA: Diagnosis not present

## 2021-08-30 DIAGNOSIS — Z992 Dependence on renal dialysis: Secondary | ICD-10-CM | POA: Diagnosis not present

## 2021-08-30 DIAGNOSIS — N2581 Secondary hyperparathyroidism of renal origin: Secondary | ICD-10-CM | POA: Diagnosis not present

## 2021-09-02 DIAGNOSIS — Z992 Dependence on renal dialysis: Secondary | ICD-10-CM | POA: Diagnosis not present

## 2021-09-02 DIAGNOSIS — N186 End stage renal disease: Secondary | ICD-10-CM | POA: Diagnosis not present

## 2021-09-02 DIAGNOSIS — N2581 Secondary hyperparathyroidism of renal origin: Secondary | ICD-10-CM | POA: Diagnosis not present

## 2021-09-04 DIAGNOSIS — N186 End stage renal disease: Secondary | ICD-10-CM | POA: Diagnosis not present

## 2021-09-04 DIAGNOSIS — N2581 Secondary hyperparathyroidism of renal origin: Secondary | ICD-10-CM | POA: Diagnosis not present

## 2021-09-04 DIAGNOSIS — Z992 Dependence on renal dialysis: Secondary | ICD-10-CM | POA: Diagnosis not present

## 2021-09-06 DIAGNOSIS — N2581 Secondary hyperparathyroidism of renal origin: Secondary | ICD-10-CM | POA: Diagnosis not present

## 2021-09-06 DIAGNOSIS — N186 End stage renal disease: Secondary | ICD-10-CM | POA: Diagnosis not present

## 2021-09-06 DIAGNOSIS — Z992 Dependence on renal dialysis: Secondary | ICD-10-CM | POA: Diagnosis not present

## 2021-09-09 DIAGNOSIS — N186 End stage renal disease: Secondary | ICD-10-CM | POA: Diagnosis not present

## 2021-09-09 DIAGNOSIS — N2581 Secondary hyperparathyroidism of renal origin: Secondary | ICD-10-CM | POA: Diagnosis not present

## 2021-09-09 DIAGNOSIS — Z992 Dependence on renal dialysis: Secondary | ICD-10-CM | POA: Diagnosis not present

## 2021-09-11 DIAGNOSIS — Z992 Dependence on renal dialysis: Secondary | ICD-10-CM | POA: Diagnosis not present

## 2021-09-11 DIAGNOSIS — N2581 Secondary hyperparathyroidism of renal origin: Secondary | ICD-10-CM | POA: Diagnosis not present

## 2021-09-11 DIAGNOSIS — N186 End stage renal disease: Secondary | ICD-10-CM | POA: Diagnosis not present

## 2021-09-13 DIAGNOSIS — N2581 Secondary hyperparathyroidism of renal origin: Secondary | ICD-10-CM | POA: Diagnosis not present

## 2021-09-13 DIAGNOSIS — N186 End stage renal disease: Secondary | ICD-10-CM | POA: Diagnosis not present

## 2021-09-13 DIAGNOSIS — Z992 Dependence on renal dialysis: Secondary | ICD-10-CM | POA: Diagnosis not present

## 2021-09-16 DIAGNOSIS — N2581 Secondary hyperparathyroidism of renal origin: Secondary | ICD-10-CM | POA: Diagnosis not present

## 2021-09-16 DIAGNOSIS — Z992 Dependence on renal dialysis: Secondary | ICD-10-CM | POA: Diagnosis not present

## 2021-09-16 DIAGNOSIS — N186 End stage renal disease: Secondary | ICD-10-CM | POA: Diagnosis not present

## 2021-09-18 DIAGNOSIS — Z992 Dependence on renal dialysis: Secondary | ICD-10-CM | POA: Diagnosis not present

## 2021-09-18 DIAGNOSIS — N186 End stage renal disease: Secondary | ICD-10-CM | POA: Diagnosis not present

## 2021-09-18 DIAGNOSIS — N2581 Secondary hyperparathyroidism of renal origin: Secondary | ICD-10-CM | POA: Diagnosis not present

## 2021-09-20 DIAGNOSIS — Z992 Dependence on renal dialysis: Secondary | ICD-10-CM | POA: Diagnosis not present

## 2021-09-20 DIAGNOSIS — N2581 Secondary hyperparathyroidism of renal origin: Secondary | ICD-10-CM | POA: Diagnosis not present

## 2021-09-20 DIAGNOSIS — N186 End stage renal disease: Secondary | ICD-10-CM | POA: Diagnosis not present

## 2021-09-21 DIAGNOSIS — Q612 Polycystic kidney, adult type: Secondary | ICD-10-CM | POA: Diagnosis not present

## 2021-09-21 DIAGNOSIS — N186 End stage renal disease: Secondary | ICD-10-CM | POA: Diagnosis not present

## 2021-09-21 DIAGNOSIS — Z992 Dependence on renal dialysis: Secondary | ICD-10-CM | POA: Diagnosis not present

## 2021-09-23 DIAGNOSIS — N2581 Secondary hyperparathyroidism of renal origin: Secondary | ICD-10-CM | POA: Diagnosis not present

## 2021-09-23 DIAGNOSIS — Z992 Dependence on renal dialysis: Secondary | ICD-10-CM | POA: Diagnosis not present

## 2021-09-23 DIAGNOSIS — N186 End stage renal disease: Secondary | ICD-10-CM | POA: Diagnosis not present

## 2021-09-25 ENCOUNTER — Encounter (HOSPITAL_BASED_OUTPATIENT_CLINIC_OR_DEPARTMENT_OTHER): Payer: Self-pay | Admitting: Emergency Medicine

## 2021-09-25 ENCOUNTER — Emergency Department (HOSPITAL_BASED_OUTPATIENT_CLINIC_OR_DEPARTMENT_OTHER)
Admission: EM | Admit: 2021-09-25 | Discharge: 2021-09-25 | Disposition: A | Discharge status: Home / Self Care | Payer: Medicare HMO | Attending: Emergency Medicine | Admitting: Emergency Medicine

## 2021-09-25 ENCOUNTER — Other Ambulatory Visit: Payer: Self-pay

## 2021-09-25 DIAGNOSIS — M79642 Pain in left hand: Secondary | ICD-10-CM | POA: Diagnosis not present

## 2021-09-25 DIAGNOSIS — R202 Paresthesia of skin: Secondary | ICD-10-CM | POA: Diagnosis not present

## 2021-09-25 DIAGNOSIS — Z992 Dependence on renal dialysis: Secondary | ICD-10-CM | POA: Diagnosis not present

## 2021-09-25 MED ORDER — HYDROCODONE-ACETAMINOPHEN 5-325 MG PO TABS: ORAL_TABLET | ORAL | 0 refills | Status: DC

## 2021-09-25 NOTE — ED Notes (Signed)
Pt asked for an Ace Wrap to extend from her wrist to her fingers to help support her fingers as well. Pt placed in Simple Wrist Brace and Pt stated that it felt better having both the Brace and Ace Wrap together.

## 2021-09-25 NOTE — ED Provider Notes (Signed)
[facility] EMERGENCY DEPT Provider Note   CSN: [id number] Arrival date & time: 09/25/21  0840     History  Chief Complaint  Patient presents with   Hand Pain    Sue Neal is a 76 y.o. female.  HPI Patient has medical history significant for hemodialysis.  She is on Tuesday Thursday Saturday schedule.  Patient reports she is presenting today for left hand pain.  This has been going on for at least a week.  She reports the pain occurs at night and in the morning when she awakens.  She reports she has pain in the palm of her left hand.  It is aching in quality.  She will also then experience some numbness in the left third fingertip.  She reports this symptom is most pronounced in the left hand.  She admits however that the right hand sometimes has a bit of numbness to the third finger.  Patient reports 3 years ago she was told that she had carpal tunnel.  She doubts this is the case.  She does not think that she uses her wrist in such a way that she should have carpal tunnel and she does not think that she lays on it in any awkward position if she sleeps.  Patient reports that during the course of the day the symptoms improved and she does not have hand pain or numbness throughout the day.  She has been trying some Tylenol without much relief.  Patient has old, dysfunctional dialysis grafts in the left arm.  No pain or swelling is occurring around them.  Patient reports that she is skipping dialysis today because she does not feel like going.  She reports she was scheduled for 10 AM.  She reports she does not feel short of breath and is not having any chest pain or swelling.    Home Medications Prior to Admission medications   Medication Sig Start Date End Date Taking? Authorizing Provider  HYDROcodone-acetaminophen (NORCO/VICODIN) 5-325 MG tablet Take 1 Vicodin tablet at bedtime. 09/25/21  Yes Charlesetta Shanks, MD  acetaminophen (TYLENOL) 500 MG tablet Take 500-1,000  mg by mouth every 6 (six) hours as needed for moderate pain.    [provider]  Cholecalciferol (D3 VITAMIN PO) Take by mouth. 12/12/20 12/11/21  [provider]  cinacalcet (SENSIPAR) 60 MG tablet Take 60 mg by mouth in the morning and at bedtime.    [provider]  doxercalciferol (HECTOROL) 4 MCG/2ML injection Doxercalciferol (Hectorol) 12/03/20 12/02/21  [provider]  Methoxy PEG-Epoetin Beta (MIRCERA IJ) Mircera 12/19/20 12/18/21  [provider]  multivitamin (RENA-VIT) TABS tablet Take 1 tablet by mouth 2 (two) times a week.    [provider]  NONFORMULARY OR COMPOUNDED ITEM Pharmazen compound: Nail Fungus - Complex Nail and Foot Disorders - Fluconazole 1%, DMSO 25%, Fluocinonide 0.05%, Ketoconazole 2%, apply 1-2 pumps to each affected nail beds and feet twice daily. 02/14/16   Trula Slade, DPM  Nutritional Supplements (FEEDING SUPPLEMENT, NEPRO CARB STEADY,) LIQD Take 237 mLs by mouth every Tuesday, Thursday, and Saturday at 6 PM.     [provider]  oxyCODONE-acetaminophen (PERCOCET) 5-325 MG tablet Take 1 tablet by mouth every 6 (six) hours as needed for severe pain. 12/09/20   Rhyne, Hulen Shouts, PA-C  sevelamer (RENVELA) 800 MG tablet Take 2,400 mg by mouth See admin instructions. Take 2400 mg by mouth with meals and take 800 mg with snacks    [provider]  Allergies    Patient has no known allergies.    Review of Systems   Review of Systems Constitutional: No fever no chills no malaise Respiratory: No cough no shortness of breath Cardiovascular: No chest pain no lightheadedness Physical Exam Updated Vital Signs BP (!) 126/46 (BP Location: Left Arm)    Pulse 80    Temp 97.8 F (36.6 C) (Oral)    Resp 16    Ht 5\' 5"  (1.651 m)    Wt 76.2 kg    LMP 02/16/2012    SpO2 100%    BMI 27.96 kg/m  Physical Exam Constitutional:      Comments: Alert, nontoxic, clinically well in appearance  Eyes:      Extraocular Movements: Extraocular movements intact.  Cardiovascular:     Rate and Rhythm: Normal rate and regular rhythm.  Pulmonary:     Effort: Pulmonary effort is normal.     Breath sounds: Normal breath sounds.  Musculoskeletal:     Comments: Examination of the left and right hands show them to be symmetric.  There is no swelling or edema.  There is no erythema of the left hand.  Patient has dysfunctional dialysis graft in the upper and forearm.  There is no swelling or soft tissue changes around these.  The arm itself has no edema or soft tissue pain to palpation.  Patient currently does not have any pain with compression over the wrist or the palm of the hand.  Patient does have arthritic changes of both hands.  Her grip strength from both hands is somewhat diminished which appears to be arthritis related.  Pulses are no longer palpable in the left graft or wrist.  Clinically however the hand is well-perfused.  It is warm and dry with good capillary refill.  No signs of ischemia  Skin:    General: Skin is warm and dry.  Neurological:     General: No focal deficit present.     Mental Status: She is oriented to person, place, and time.    ED Results / Procedures / Treatments   Labs (all labs ordered are listed, but only abnormal results are displayed) Labs Reviewed - No data to display  EKG None  Radiology No results found.  Procedures Procedures    Medications Ordered in ED Medications - No data to display  ED Course/ Medical Decision Making/ A&P                           Medical Decision Making  Patient presents as outlined.  She is having nighttime pain in the left hand.  There is no sign at this time of any objective soft tissue abnormalities of edema or erythema.  Currently I do not have suspicion for any infectious source.  Patient has old, dysfunctional grafts in the arm but no evidence that these are infected or causing any acute complications.  Clinically, the hand  is well-perfused.  At this time based on nighttime pain that resolves during the day and pain that is occurring in the palm and numbness in the third digit I think this is consistent with nerve compression.  Possibly carpal tunnel.  Plan will be to place the patient in nighttime splints and I have counseled her to take 1 Vicodin tablet at night since her symptoms are only occurring during the night.  She can use Tylenol during the day if needed.  Patient has follow-up with Dr. Amedeo Plenty making in approximately  a month. Final Clinical Impression(s) / ED Diagnoses Final diagnoses:  Left hand pain    Rx / DC Orders ED Discharge Orders          Ordered    HYDROcodone-acetaminophen (NORCO/VICODIN) 5-325 MG tablet        09/25/21 1031              Charlesetta Shanks, MD 09/25/21 1003

## 2021-09-25 NOTE — ED Notes (Signed)
Dc instructions reviewed with patient. Patient voiced understanding. Dc with belongings.  °

## 2021-09-25 NOTE — Discharge Instructions (Signed)
1.  At this time I do suspect you have carpal tunnel syndrome.  Wear the wrist brace at night. 2.  Since your pain is at night and in the morning when you awaken, take 1 Vicodin tablet at bedtime.  Since you do not have pain during the day, do not take any additional Vicodin for the daytime.  If you need some additional pain control for the daytime, you may take extra strength Tylenol per package instructions. 3.  Follow-up with Dr. Amedeo Plenty as scheduled.

## 2021-09-25 NOTE — ED Triage Notes (Signed)
Pt arrives to ED with c/o left hand pain. Pt reports she has left hand pain at night. This started a week ago. Pt denies pain in the mornings and mid-day. She reports intermittent numbness to left middle finger, ring finger x1 week.

## 2021-09-27 DIAGNOSIS — Z992 Dependence on renal dialysis: Secondary | ICD-10-CM | POA: Diagnosis not present

## 2021-09-27 DIAGNOSIS — N186 End stage renal disease: Secondary | ICD-10-CM | POA: Diagnosis not present

## 2021-09-27 DIAGNOSIS — N2581 Secondary hyperparathyroidism of renal origin: Secondary | ICD-10-CM | POA: Diagnosis not present

## 2021-09-30 DIAGNOSIS — Z992 Dependence on renal dialysis: Secondary | ICD-10-CM | POA: Diagnosis not present

## 2021-09-30 DIAGNOSIS — N186 End stage renal disease: Secondary | ICD-10-CM | POA: Diagnosis not present

## 2021-09-30 DIAGNOSIS — N2581 Secondary hyperparathyroidism of renal origin: Secondary | ICD-10-CM | POA: Diagnosis not present

## 2021-10-02 DIAGNOSIS — N2581 Secondary hyperparathyroidism of renal origin: Secondary | ICD-10-CM | POA: Diagnosis not present

## 2021-10-02 DIAGNOSIS — Z992 Dependence on renal dialysis: Secondary | ICD-10-CM | POA: Diagnosis not present

## 2021-10-02 DIAGNOSIS — N186 End stage renal disease: Secondary | ICD-10-CM | POA: Diagnosis not present

## 2021-10-04 DIAGNOSIS — N186 End stage renal disease: Secondary | ICD-10-CM | POA: Diagnosis not present

## 2021-10-04 DIAGNOSIS — N2581 Secondary hyperparathyroidism of renal origin: Secondary | ICD-10-CM | POA: Diagnosis not present

## 2021-10-04 DIAGNOSIS — Z992 Dependence on renal dialysis: Secondary | ICD-10-CM | POA: Diagnosis not present

## 2021-10-07 DIAGNOSIS — N2581 Secondary hyperparathyroidism of renal origin: Secondary | ICD-10-CM | POA: Diagnosis not present

## 2021-10-07 DIAGNOSIS — Z992 Dependence on renal dialysis: Secondary | ICD-10-CM | POA: Diagnosis not present

## 2021-10-07 DIAGNOSIS — N186 End stage renal disease: Secondary | ICD-10-CM | POA: Diagnosis not present

## 2021-10-09 DIAGNOSIS — N2581 Secondary hyperparathyroidism of renal origin: Secondary | ICD-10-CM | POA: Diagnosis not present

## 2021-10-09 DIAGNOSIS — N186 End stage renal disease: Secondary | ICD-10-CM | POA: Diagnosis not present

## 2021-10-09 DIAGNOSIS — Z992 Dependence on renal dialysis: Secondary | ICD-10-CM | POA: Diagnosis not present

## 2021-10-11 DIAGNOSIS — N186 End stage renal disease: Secondary | ICD-10-CM | POA: Diagnosis not present

## 2021-10-11 DIAGNOSIS — Z992 Dependence on renal dialysis: Secondary | ICD-10-CM | POA: Diagnosis not present

## 2021-10-11 DIAGNOSIS — N2581 Secondary hyperparathyroidism of renal origin: Secondary | ICD-10-CM | POA: Diagnosis not present

## 2021-10-14 DIAGNOSIS — Z992 Dependence on renal dialysis: Secondary | ICD-10-CM | POA: Diagnosis not present

## 2021-10-14 DIAGNOSIS — N2581 Secondary hyperparathyroidism of renal origin: Secondary | ICD-10-CM | POA: Diagnosis not present

## 2021-10-14 DIAGNOSIS — N186 End stage renal disease: Secondary | ICD-10-CM | POA: Diagnosis not present

## 2021-10-18 DIAGNOSIS — Z992 Dependence on renal dialysis: Secondary | ICD-10-CM | POA: Diagnosis not present

## 2021-10-18 DIAGNOSIS — N2581 Secondary hyperparathyroidism of renal origin: Secondary | ICD-10-CM | POA: Diagnosis not present

## 2021-10-18 DIAGNOSIS — N186 End stage renal disease: Secondary | ICD-10-CM | POA: Diagnosis not present

## 2021-10-21 DIAGNOSIS — N186 End stage renal disease: Secondary | ICD-10-CM | POA: Diagnosis not present

## 2021-10-21 DIAGNOSIS — Z992 Dependence on renal dialysis: Secondary | ICD-10-CM | POA: Diagnosis not present

## 2021-10-21 DIAGNOSIS — N2581 Secondary hyperparathyroidism of renal origin: Secondary | ICD-10-CM | POA: Diagnosis not present

## 2021-10-22 DIAGNOSIS — N186 End stage renal disease: Secondary | ICD-10-CM | POA: Diagnosis not present

## 2021-10-22 DIAGNOSIS — Z992 Dependence on renal dialysis: Secondary | ICD-10-CM | POA: Diagnosis not present

## 2021-10-22 DIAGNOSIS — Q612 Polycystic kidney, adult type: Secondary | ICD-10-CM | POA: Diagnosis not present

## 2021-10-23 DIAGNOSIS — N186 End stage renal disease: Secondary | ICD-10-CM | POA: Diagnosis not present

## 2021-10-23 DIAGNOSIS — N2581 Secondary hyperparathyroidism of renal origin: Secondary | ICD-10-CM | POA: Diagnosis not present

## 2021-10-23 DIAGNOSIS — Z992 Dependence on renal dialysis: Secondary | ICD-10-CM | POA: Diagnosis not present

## 2021-10-25 DIAGNOSIS — Z992 Dependence on renal dialysis: Secondary | ICD-10-CM | POA: Diagnosis not present

## 2021-10-25 DIAGNOSIS — N186 End stage renal disease: Secondary | ICD-10-CM | POA: Diagnosis not present

## 2021-10-25 DIAGNOSIS — N2581 Secondary hyperparathyroidism of renal origin: Secondary | ICD-10-CM | POA: Diagnosis not present

## 2021-10-28 DIAGNOSIS — N186 End stage renal disease: Secondary | ICD-10-CM | POA: Diagnosis not present

## 2021-10-28 DIAGNOSIS — N2581 Secondary hyperparathyroidism of renal origin: Secondary | ICD-10-CM | POA: Diagnosis not present

## 2021-10-28 DIAGNOSIS — Z992 Dependence on renal dialysis: Secondary | ICD-10-CM | POA: Diagnosis not present

## 2021-10-30 DIAGNOSIS — N186 End stage renal disease: Secondary | ICD-10-CM | POA: Diagnosis not present

## 2021-10-30 DIAGNOSIS — N2581 Secondary hyperparathyroidism of renal origin: Secondary | ICD-10-CM | POA: Diagnosis not present

## 2021-10-30 DIAGNOSIS — Z992 Dependence on renal dialysis: Secondary | ICD-10-CM | POA: Diagnosis not present

## 2021-11-01 DIAGNOSIS — Z992 Dependence on renal dialysis: Secondary | ICD-10-CM | POA: Diagnosis not present

## 2021-11-01 DIAGNOSIS — N186 End stage renal disease: Secondary | ICD-10-CM | POA: Diagnosis not present

## 2021-11-01 DIAGNOSIS — N2581 Secondary hyperparathyroidism of renal origin: Secondary | ICD-10-CM | POA: Diagnosis not present

## 2021-11-04 DIAGNOSIS — N2581 Secondary hyperparathyroidism of renal origin: Secondary | ICD-10-CM | POA: Diagnosis not present

## 2021-11-04 DIAGNOSIS — N186 End stage renal disease: Secondary | ICD-10-CM | POA: Diagnosis not present

## 2021-11-04 DIAGNOSIS — Z992 Dependence on renal dialysis: Secondary | ICD-10-CM | POA: Diagnosis not present

## 2021-11-06 DIAGNOSIS — Z992 Dependence on renal dialysis: Secondary | ICD-10-CM | POA: Diagnosis not present

## 2021-11-06 DIAGNOSIS — N186 End stage renal disease: Secondary | ICD-10-CM | POA: Diagnosis not present

## 2021-11-06 DIAGNOSIS — N2581 Secondary hyperparathyroidism of renal origin: Secondary | ICD-10-CM | POA: Diagnosis not present

## 2021-11-08 DIAGNOSIS — N186 End stage renal disease: Secondary | ICD-10-CM | POA: Diagnosis not present

## 2021-11-08 DIAGNOSIS — N2581 Secondary hyperparathyroidism of renal origin: Secondary | ICD-10-CM | POA: Diagnosis not present

## 2021-11-08 DIAGNOSIS — Z992 Dependence on renal dialysis: Secondary | ICD-10-CM | POA: Diagnosis not present

## 2021-11-11 DIAGNOSIS — Z992 Dependence on renal dialysis: Secondary | ICD-10-CM | POA: Diagnosis not present

## 2021-11-11 DIAGNOSIS — N2581 Secondary hyperparathyroidism of renal origin: Secondary | ICD-10-CM | POA: Diagnosis not present

## 2021-11-11 DIAGNOSIS — N186 End stage renal disease: Secondary | ICD-10-CM | POA: Diagnosis not present

## 2021-11-15 DIAGNOSIS — N2581 Secondary hyperparathyroidism of renal origin: Secondary | ICD-10-CM | POA: Diagnosis not present

## 2021-11-15 DIAGNOSIS — Z992 Dependence on renal dialysis: Secondary | ICD-10-CM | POA: Diagnosis not present

## 2021-11-15 DIAGNOSIS — N186 End stage renal disease: Secondary | ICD-10-CM | POA: Diagnosis not present

## 2021-11-18 DIAGNOSIS — N2581 Secondary hyperparathyroidism of renal origin: Secondary | ICD-10-CM | POA: Diagnosis not present

## 2021-11-18 DIAGNOSIS — N186 End stage renal disease: Secondary | ICD-10-CM | POA: Diagnosis not present

## 2021-11-18 DIAGNOSIS — Z992 Dependence on renal dialysis: Secondary | ICD-10-CM | POA: Diagnosis not present

## 2021-11-18 DIAGNOSIS — H00024 Hordeolum internum left upper eyelid: Secondary | ICD-10-CM | POA: Diagnosis not present

## 2021-11-19 DIAGNOSIS — Q612 Polycystic kidney, adult type: Secondary | ICD-10-CM | POA: Diagnosis not present

## 2021-11-19 DIAGNOSIS — Z992 Dependence on renal dialysis: Secondary | ICD-10-CM | POA: Diagnosis not present

## 2021-11-19 DIAGNOSIS — N186 End stage renal disease: Secondary | ICD-10-CM | POA: Diagnosis not present

## 2021-11-20 DIAGNOSIS — N186 End stage renal disease: Secondary | ICD-10-CM | POA: Diagnosis not present

## 2021-11-20 DIAGNOSIS — Z992 Dependence on renal dialysis: Secondary | ICD-10-CM | POA: Diagnosis not present

## 2021-11-20 DIAGNOSIS — N2581 Secondary hyperparathyroidism of renal origin: Secondary | ICD-10-CM | POA: Diagnosis not present

## 2021-11-22 DIAGNOSIS — Z992 Dependence on renal dialysis: Secondary | ICD-10-CM | POA: Diagnosis not present

## 2021-11-22 DIAGNOSIS — N186 End stage renal disease: Secondary | ICD-10-CM | POA: Diagnosis not present

## 2021-11-22 DIAGNOSIS — N2581 Secondary hyperparathyroidism of renal origin: Secondary | ICD-10-CM | POA: Diagnosis not present

## 2021-11-25 DIAGNOSIS — N186 End stage renal disease: Secondary | ICD-10-CM | POA: Diagnosis not present

## 2021-11-25 DIAGNOSIS — Z992 Dependence on renal dialysis: Secondary | ICD-10-CM | POA: Diagnosis not present

## 2021-11-25 DIAGNOSIS — N2581 Secondary hyperparathyroidism of renal origin: Secondary | ICD-10-CM | POA: Diagnosis not present

## 2021-11-27 DIAGNOSIS — N2581 Secondary hyperparathyroidism of renal origin: Secondary | ICD-10-CM | POA: Diagnosis not present

## 2021-11-27 DIAGNOSIS — Z992 Dependence on renal dialysis: Secondary | ICD-10-CM | POA: Diagnosis not present

## 2021-11-27 DIAGNOSIS — N186 End stage renal disease: Secondary | ICD-10-CM | POA: Diagnosis not present

## 2021-11-29 DIAGNOSIS — N186 End stage renal disease: Secondary | ICD-10-CM | POA: Diagnosis not present

## 2021-11-29 DIAGNOSIS — Z992 Dependence on renal dialysis: Secondary | ICD-10-CM | POA: Diagnosis not present

## 2021-11-29 DIAGNOSIS — N2581 Secondary hyperparathyroidism of renal origin: Secondary | ICD-10-CM | POA: Diagnosis not present

## 2021-12-02 DIAGNOSIS — Z992 Dependence on renal dialysis: Secondary | ICD-10-CM | POA: Diagnosis not present

## 2021-12-02 DIAGNOSIS — N186 End stage renal disease: Secondary | ICD-10-CM | POA: Diagnosis not present

## 2021-12-02 DIAGNOSIS — N2581 Secondary hyperparathyroidism of renal origin: Secondary | ICD-10-CM | POA: Diagnosis not present

## 2021-12-03 DIAGNOSIS — H00024 Hordeolum internum left upper eyelid: Secondary | ICD-10-CM | POA: Diagnosis not present

## 2021-12-06 DIAGNOSIS — Z992 Dependence on renal dialysis: Secondary | ICD-10-CM | POA: Diagnosis not present

## 2021-12-06 DIAGNOSIS — N186 End stage renal disease: Secondary | ICD-10-CM | POA: Diagnosis not present

## 2021-12-06 DIAGNOSIS — N2581 Secondary hyperparathyroidism of renal origin: Secondary | ICD-10-CM | POA: Diagnosis not present

## 2021-12-09 DIAGNOSIS — N186 End stage renal disease: Secondary | ICD-10-CM | POA: Diagnosis not present

## 2021-12-09 DIAGNOSIS — N2581 Secondary hyperparathyroidism of renal origin: Secondary | ICD-10-CM | POA: Diagnosis not present

## 2021-12-09 DIAGNOSIS — Z992 Dependence on renal dialysis: Secondary | ICD-10-CM | POA: Diagnosis not present

## 2021-12-10 DIAGNOSIS — N186 End stage renal disease: Secondary | ICD-10-CM | POA: Diagnosis not present

## 2021-12-10 DIAGNOSIS — Z2821 Immunization not carried out because of patient refusal: Secondary | ICD-10-CM | POA: Diagnosis not present

## 2021-12-10 DIAGNOSIS — I700 Atherosclerosis of aorta: Secondary | ICD-10-CM | POA: Diagnosis not present

## 2021-12-10 DIAGNOSIS — N2581 Secondary hyperparathyroidism of renal origin: Secondary | ICD-10-CM | POA: Diagnosis not present

## 2021-12-10 DIAGNOSIS — B351 Tinea unguium: Secondary | ICD-10-CM | POA: Diagnosis not present

## 2021-12-10 DIAGNOSIS — M810 Age-related osteoporosis without current pathological fracture: Secondary | ICD-10-CM | POA: Diagnosis not present

## 2021-12-10 DIAGNOSIS — Z992 Dependence on renal dialysis: Secondary | ICD-10-CM | POA: Diagnosis not present

## 2021-12-10 DIAGNOSIS — I272 Pulmonary hypertension, unspecified: Secondary | ICD-10-CM | POA: Diagnosis not present

## 2021-12-10 DIAGNOSIS — N281 Cyst of kidney, acquired: Secondary | ICD-10-CM | POA: Diagnosis not present

## 2021-12-11 DIAGNOSIS — Z992 Dependence on renal dialysis: Secondary | ICD-10-CM | POA: Diagnosis not present

## 2021-12-11 DIAGNOSIS — N186 End stage renal disease: Secondary | ICD-10-CM | POA: Diagnosis not present

## 2021-12-11 DIAGNOSIS — N2581 Secondary hyperparathyroidism of renal origin: Secondary | ICD-10-CM | POA: Diagnosis not present

## 2021-12-13 DIAGNOSIS — N186 End stage renal disease: Secondary | ICD-10-CM | POA: Diagnosis not present

## 2021-12-13 DIAGNOSIS — Z992 Dependence on renal dialysis: Secondary | ICD-10-CM | POA: Diagnosis not present

## 2021-12-13 DIAGNOSIS — N2581 Secondary hyperparathyroidism of renal origin: Secondary | ICD-10-CM | POA: Diagnosis not present

## 2021-12-16 DIAGNOSIS — N186 End stage renal disease: Secondary | ICD-10-CM | POA: Diagnosis not present

## 2021-12-16 DIAGNOSIS — Z992 Dependence on renal dialysis: Secondary | ICD-10-CM | POA: Diagnosis not present

## 2021-12-16 DIAGNOSIS — N2581 Secondary hyperparathyroidism of renal origin: Secondary | ICD-10-CM | POA: Diagnosis not present

## 2021-12-18 DIAGNOSIS — Z992 Dependence on renal dialysis: Secondary | ICD-10-CM | POA: Diagnosis not present

## 2021-12-18 DIAGNOSIS — N2581 Secondary hyperparathyroidism of renal origin: Secondary | ICD-10-CM | POA: Diagnosis not present

## 2021-12-18 DIAGNOSIS — N186 End stage renal disease: Secondary | ICD-10-CM | POA: Diagnosis not present

## 2021-12-19 DIAGNOSIS — G5601 Carpal tunnel syndrome, right upper limb: Secondary | ICD-10-CM | POA: Diagnosis not present

## 2021-12-19 DIAGNOSIS — G5603 Carpal tunnel syndrome, bilateral upper limbs: Secondary | ICD-10-CM | POA: Diagnosis not present

## 2021-12-19 DIAGNOSIS — G5602 Carpal tunnel syndrome, left upper limb: Secondary | ICD-10-CM | POA: Diagnosis not present

## 2022-03-21 DIAGNOSIS — Q612 Polycystic kidney, adult type: Secondary | ICD-10-CM | POA: Diagnosis not present

## 2022-03-21 DIAGNOSIS — N2581 Secondary hyperparathyroidism of renal origin: Secondary | ICD-10-CM | POA: Diagnosis not present

## 2022-03-21 DIAGNOSIS — N186 End stage renal disease: Secondary | ICD-10-CM | POA: Diagnosis not present

## 2022-03-21 DIAGNOSIS — Z992 Dependence on renal dialysis: Secondary | ICD-10-CM | POA: Diagnosis not present

## 2022-03-21 DIAGNOSIS — D509 Iron deficiency anemia, unspecified: Secondary | ICD-10-CM | POA: Diagnosis not present

## 2022-03-24 DIAGNOSIS — D509 Iron deficiency anemia, unspecified: Secondary | ICD-10-CM | POA: Diagnosis not present

## 2022-03-24 DIAGNOSIS — N186 End stage renal disease: Secondary | ICD-10-CM | POA: Diagnosis not present

## 2022-03-24 DIAGNOSIS — Z992 Dependence on renal dialysis: Secondary | ICD-10-CM | POA: Diagnosis not present

## 2022-03-24 DIAGNOSIS — N2581 Secondary hyperparathyroidism of renal origin: Secondary | ICD-10-CM | POA: Diagnosis not present

## 2022-03-26 DIAGNOSIS — Z992 Dependence on renal dialysis: Secondary | ICD-10-CM | POA: Diagnosis not present

## 2022-03-26 DIAGNOSIS — D509 Iron deficiency anemia, unspecified: Secondary | ICD-10-CM | POA: Diagnosis not present

## 2022-03-26 DIAGNOSIS — N2581 Secondary hyperparathyroidism of renal origin: Secondary | ICD-10-CM | POA: Diagnosis not present

## 2022-03-26 DIAGNOSIS — N186 End stage renal disease: Secondary | ICD-10-CM | POA: Diagnosis not present

## 2022-03-28 DIAGNOSIS — Z992 Dependence on renal dialysis: Secondary | ICD-10-CM | POA: Diagnosis not present

## 2022-03-28 DIAGNOSIS — N186 End stage renal disease: Secondary | ICD-10-CM | POA: Diagnosis not present

## 2022-03-28 DIAGNOSIS — N2581 Secondary hyperparathyroidism of renal origin: Secondary | ICD-10-CM | POA: Diagnosis not present

## 2022-03-28 DIAGNOSIS — D509 Iron deficiency anemia, unspecified: Secondary | ICD-10-CM | POA: Diagnosis not present

## 2022-03-31 DIAGNOSIS — D509 Iron deficiency anemia, unspecified: Secondary | ICD-10-CM | POA: Diagnosis not present

## 2022-03-31 DIAGNOSIS — Z992 Dependence on renal dialysis: Secondary | ICD-10-CM | POA: Diagnosis not present

## 2022-03-31 DIAGNOSIS — N186 End stage renal disease: Secondary | ICD-10-CM | POA: Diagnosis not present

## 2022-03-31 DIAGNOSIS — N2581 Secondary hyperparathyroidism of renal origin: Secondary | ICD-10-CM | POA: Diagnosis not present

## 2022-04-04 DIAGNOSIS — N2581 Secondary hyperparathyroidism of renal origin: Secondary | ICD-10-CM | POA: Diagnosis not present

## 2022-04-04 DIAGNOSIS — D509 Iron deficiency anemia, unspecified: Secondary | ICD-10-CM | POA: Diagnosis not present

## 2022-04-04 DIAGNOSIS — N186 End stage renal disease: Secondary | ICD-10-CM | POA: Diagnosis not present

## 2022-04-04 DIAGNOSIS — Z992 Dependence on renal dialysis: Secondary | ICD-10-CM | POA: Diagnosis not present

## 2022-04-07 DIAGNOSIS — N186 End stage renal disease: Secondary | ICD-10-CM | POA: Diagnosis not present

## 2022-04-07 DIAGNOSIS — Z992 Dependence on renal dialysis: Secondary | ICD-10-CM | POA: Diagnosis not present

## 2022-04-07 DIAGNOSIS — D509 Iron deficiency anemia, unspecified: Secondary | ICD-10-CM | POA: Diagnosis not present

## 2022-04-07 DIAGNOSIS — N2581 Secondary hyperparathyroidism of renal origin: Secondary | ICD-10-CM | POA: Diagnosis not present

## 2022-04-09 DIAGNOSIS — D509 Iron deficiency anemia, unspecified: Secondary | ICD-10-CM | POA: Diagnosis not present

## 2022-04-09 DIAGNOSIS — Z992 Dependence on renal dialysis: Secondary | ICD-10-CM | POA: Diagnosis not present

## 2022-04-09 DIAGNOSIS — N186 End stage renal disease: Secondary | ICD-10-CM | POA: Diagnosis not present

## 2022-04-09 DIAGNOSIS — N2581 Secondary hyperparathyroidism of renal origin: Secondary | ICD-10-CM | POA: Diagnosis not present

## 2022-04-11 DIAGNOSIS — N186 End stage renal disease: Secondary | ICD-10-CM | POA: Diagnosis not present

## 2022-04-11 DIAGNOSIS — N2581 Secondary hyperparathyroidism of renal origin: Secondary | ICD-10-CM | POA: Diagnosis not present

## 2022-04-11 DIAGNOSIS — D509 Iron deficiency anemia, unspecified: Secondary | ICD-10-CM | POA: Diagnosis not present

## 2022-04-11 DIAGNOSIS — Z992 Dependence on renal dialysis: Secondary | ICD-10-CM | POA: Diagnosis not present

## 2022-04-14 DIAGNOSIS — N2581 Secondary hyperparathyroidism of renal origin: Secondary | ICD-10-CM | POA: Diagnosis not present

## 2022-04-14 DIAGNOSIS — D509 Iron deficiency anemia, unspecified: Secondary | ICD-10-CM | POA: Diagnosis not present

## 2022-04-14 DIAGNOSIS — Z992 Dependence on renal dialysis: Secondary | ICD-10-CM | POA: Diagnosis not present

## 2022-04-14 DIAGNOSIS — N186 End stage renal disease: Secondary | ICD-10-CM | POA: Diagnosis not present

## 2022-04-16 DIAGNOSIS — Z992 Dependence on renal dialysis: Secondary | ICD-10-CM | POA: Diagnosis not present

## 2022-04-16 DIAGNOSIS — D509 Iron deficiency anemia, unspecified: Secondary | ICD-10-CM | POA: Diagnosis not present

## 2022-04-16 DIAGNOSIS — N2581 Secondary hyperparathyroidism of renal origin: Secondary | ICD-10-CM | POA: Diagnosis not present

## 2022-04-16 DIAGNOSIS — N186 End stage renal disease: Secondary | ICD-10-CM | POA: Diagnosis not present

## 2022-04-18 DIAGNOSIS — Z992 Dependence on renal dialysis: Secondary | ICD-10-CM | POA: Diagnosis not present

## 2022-04-18 DIAGNOSIS — N186 End stage renal disease: Secondary | ICD-10-CM | POA: Diagnosis not present

## 2022-04-18 DIAGNOSIS — N2581 Secondary hyperparathyroidism of renal origin: Secondary | ICD-10-CM | POA: Diagnosis not present

## 2022-04-18 DIAGNOSIS — D509 Iron deficiency anemia, unspecified: Secondary | ICD-10-CM | POA: Diagnosis not present

## 2022-04-21 DIAGNOSIS — N186 End stage renal disease: Secondary | ICD-10-CM | POA: Diagnosis not present

## 2022-04-21 DIAGNOSIS — Z992 Dependence on renal dialysis: Secondary | ICD-10-CM | POA: Diagnosis not present

## 2022-04-21 DIAGNOSIS — N2581 Secondary hyperparathyroidism of renal origin: Secondary | ICD-10-CM | POA: Diagnosis not present

## 2022-04-21 DIAGNOSIS — Q612 Polycystic kidney, adult type: Secondary | ICD-10-CM | POA: Diagnosis not present

## 2022-04-23 DIAGNOSIS — N186 End stage renal disease: Secondary | ICD-10-CM | POA: Diagnosis not present

## 2022-04-23 DIAGNOSIS — N2581 Secondary hyperparathyroidism of renal origin: Secondary | ICD-10-CM | POA: Diagnosis not present

## 2022-04-23 DIAGNOSIS — Z992 Dependence on renal dialysis: Secondary | ICD-10-CM | POA: Diagnosis not present

## 2022-04-25 DIAGNOSIS — N186 End stage renal disease: Secondary | ICD-10-CM | POA: Diagnosis not present

## 2022-04-25 DIAGNOSIS — N2581 Secondary hyperparathyroidism of renal origin: Secondary | ICD-10-CM | POA: Diagnosis not present

## 2022-04-25 DIAGNOSIS — Z992 Dependence on renal dialysis: Secondary | ICD-10-CM | POA: Diagnosis not present

## 2022-04-28 DIAGNOSIS — N186 End stage renal disease: Secondary | ICD-10-CM | POA: Diagnosis not present

## 2022-04-28 DIAGNOSIS — Z992 Dependence on renal dialysis: Secondary | ICD-10-CM | POA: Diagnosis not present

## 2022-04-28 DIAGNOSIS — N2581 Secondary hyperparathyroidism of renal origin: Secondary | ICD-10-CM | POA: Diagnosis not present

## 2022-04-30 DIAGNOSIS — N186 End stage renal disease: Secondary | ICD-10-CM | POA: Diagnosis not present

## 2022-04-30 DIAGNOSIS — N2581 Secondary hyperparathyroidism of renal origin: Secondary | ICD-10-CM | POA: Diagnosis not present

## 2022-04-30 DIAGNOSIS — Z992 Dependence on renal dialysis: Secondary | ICD-10-CM | POA: Diagnosis not present

## 2022-05-01 DIAGNOSIS — N186 End stage renal disease: Secondary | ICD-10-CM | POA: Diagnosis not present

## 2022-05-01 DIAGNOSIS — N2581 Secondary hyperparathyroidism of renal origin: Secondary | ICD-10-CM | POA: Diagnosis not present

## 2022-05-01 DIAGNOSIS — Z992 Dependence on renal dialysis: Secondary | ICD-10-CM | POA: Diagnosis not present

## 2022-05-04 DIAGNOSIS — E669 Obesity, unspecified: Secondary | ICD-10-CM | POA: Diagnosis not present

## 2022-05-04 DIAGNOSIS — N186 End stage renal disease: Secondary | ICD-10-CM | POA: Diagnosis not present

## 2022-05-04 DIAGNOSIS — G5601 Carpal tunnel syndrome, right upper limb: Secondary | ICD-10-CM | POA: Diagnosis not present

## 2022-05-04 DIAGNOSIS — M1991 Primary osteoarthritis, unspecified site: Secondary | ICD-10-CM | POA: Diagnosis not present

## 2022-05-04 DIAGNOSIS — Z6830 Body mass index (BMI) 30.0-30.9, adult: Secondary | ICD-10-CM | POA: Diagnosis not present

## 2022-05-04 DIAGNOSIS — G5602 Carpal tunnel syndrome, left upper limb: Secondary | ICD-10-CM | POA: Diagnosis not present

## 2022-05-05 DIAGNOSIS — N186 End stage renal disease: Secondary | ICD-10-CM | POA: Diagnosis not present

## 2022-05-05 DIAGNOSIS — Z992 Dependence on renal dialysis: Secondary | ICD-10-CM | POA: Diagnosis not present

## 2022-05-05 DIAGNOSIS — N2581 Secondary hyperparathyroidism of renal origin: Secondary | ICD-10-CM | POA: Diagnosis not present

## 2022-05-07 DIAGNOSIS — Z992 Dependence on renal dialysis: Secondary | ICD-10-CM | POA: Diagnosis not present

## 2022-05-07 DIAGNOSIS — N186 End stage renal disease: Secondary | ICD-10-CM | POA: Diagnosis not present

## 2022-05-07 DIAGNOSIS — N2581 Secondary hyperparathyroidism of renal origin: Secondary | ICD-10-CM | POA: Diagnosis not present

## 2022-05-09 DIAGNOSIS — Z992 Dependence on renal dialysis: Secondary | ICD-10-CM | POA: Diagnosis not present

## 2022-05-09 DIAGNOSIS — N186 End stage renal disease: Secondary | ICD-10-CM | POA: Diagnosis not present

## 2022-05-09 DIAGNOSIS — N2581 Secondary hyperparathyroidism of renal origin: Secondary | ICD-10-CM | POA: Diagnosis not present

## 2022-05-12 DIAGNOSIS — Z992 Dependence on renal dialysis: Secondary | ICD-10-CM | POA: Diagnosis not present

## 2022-05-12 DIAGNOSIS — N2581 Secondary hyperparathyroidism of renal origin: Secondary | ICD-10-CM | POA: Diagnosis not present

## 2022-05-12 DIAGNOSIS — N186 End stage renal disease: Secondary | ICD-10-CM | POA: Diagnosis not present

## 2022-05-14 DIAGNOSIS — Z992 Dependence on renal dialysis: Secondary | ICD-10-CM | POA: Diagnosis not present

## 2022-05-14 DIAGNOSIS — N186 End stage renal disease: Secondary | ICD-10-CM | POA: Diagnosis not present

## 2022-05-14 DIAGNOSIS — N2581 Secondary hyperparathyroidism of renal origin: Secondary | ICD-10-CM | POA: Diagnosis not present

## 2022-05-16 DIAGNOSIS — N186 End stage renal disease: Secondary | ICD-10-CM | POA: Diagnosis not present

## 2022-05-16 DIAGNOSIS — Z992 Dependence on renal dialysis: Secondary | ICD-10-CM | POA: Diagnosis not present

## 2022-05-16 DIAGNOSIS — N2581 Secondary hyperparathyroidism of renal origin: Secondary | ICD-10-CM | POA: Diagnosis not present

## 2022-05-19 DIAGNOSIS — N2581 Secondary hyperparathyroidism of renal origin: Secondary | ICD-10-CM | POA: Diagnosis not present

## 2022-05-19 DIAGNOSIS — N186 End stage renal disease: Secondary | ICD-10-CM | POA: Diagnosis not present

## 2022-05-19 DIAGNOSIS — Z992 Dependence on renal dialysis: Secondary | ICD-10-CM | POA: Diagnosis not present

## 2022-05-22 DIAGNOSIS — N186 End stage renal disease: Secondary | ICD-10-CM | POA: Diagnosis not present

## 2022-05-22 DIAGNOSIS — Z992 Dependence on renal dialysis: Secondary | ICD-10-CM | POA: Diagnosis not present

## 2022-05-22 DIAGNOSIS — Q612 Polycystic kidney, adult type: Secondary | ICD-10-CM | POA: Diagnosis not present

## 2022-05-23 DIAGNOSIS — N2581 Secondary hyperparathyroidism of renal origin: Secondary | ICD-10-CM | POA: Diagnosis not present

## 2022-05-23 DIAGNOSIS — N186 End stage renal disease: Secondary | ICD-10-CM | POA: Diagnosis not present

## 2022-05-23 DIAGNOSIS — Z992 Dependence on renal dialysis: Secondary | ICD-10-CM | POA: Diagnosis not present

## 2022-05-26 DIAGNOSIS — N2581 Secondary hyperparathyroidism of renal origin: Secondary | ICD-10-CM | POA: Diagnosis not present

## 2022-05-26 DIAGNOSIS — Z992 Dependence on renal dialysis: Secondary | ICD-10-CM | POA: Diagnosis not present

## 2022-05-26 DIAGNOSIS — N186 End stage renal disease: Secondary | ICD-10-CM | POA: Diagnosis not present

## 2022-05-28 DIAGNOSIS — N2581 Secondary hyperparathyroidism of renal origin: Secondary | ICD-10-CM | POA: Diagnosis not present

## 2022-05-28 DIAGNOSIS — Z992 Dependence on renal dialysis: Secondary | ICD-10-CM | POA: Diagnosis not present

## 2022-05-28 DIAGNOSIS — N186 End stage renal disease: Secondary | ICD-10-CM | POA: Diagnosis not present

## 2022-05-30 DIAGNOSIS — Z992 Dependence on renal dialysis: Secondary | ICD-10-CM | POA: Diagnosis not present

## 2022-05-30 DIAGNOSIS — N2581 Secondary hyperparathyroidism of renal origin: Secondary | ICD-10-CM | POA: Diagnosis not present

## 2022-05-30 DIAGNOSIS — N186 End stage renal disease: Secondary | ICD-10-CM | POA: Diagnosis not present

## 2022-06-02 DIAGNOSIS — Z992 Dependence on renal dialysis: Secondary | ICD-10-CM | POA: Diagnosis not present

## 2022-06-02 DIAGNOSIS — N186 End stage renal disease: Secondary | ICD-10-CM | POA: Diagnosis not present

## 2022-06-02 DIAGNOSIS — N2581 Secondary hyperparathyroidism of renal origin: Secondary | ICD-10-CM | POA: Diagnosis not present

## 2022-06-04 DIAGNOSIS — Z992 Dependence on renal dialysis: Secondary | ICD-10-CM | POA: Diagnosis not present

## 2022-06-04 DIAGNOSIS — N186 End stage renal disease: Secondary | ICD-10-CM | POA: Diagnosis not present

## 2022-06-04 DIAGNOSIS — N2581 Secondary hyperparathyroidism of renal origin: Secondary | ICD-10-CM | POA: Diagnosis not present

## 2022-06-06 DIAGNOSIS — N186 End stage renal disease: Secondary | ICD-10-CM | POA: Diagnosis not present

## 2022-06-06 DIAGNOSIS — N2581 Secondary hyperparathyroidism of renal origin: Secondary | ICD-10-CM | POA: Diagnosis not present

## 2022-06-06 DIAGNOSIS — Z992 Dependence on renal dialysis: Secondary | ICD-10-CM | POA: Diagnosis not present

## 2022-06-09 DIAGNOSIS — N2581 Secondary hyperparathyroidism of renal origin: Secondary | ICD-10-CM | POA: Diagnosis not present

## 2022-06-09 DIAGNOSIS — N186 End stage renal disease: Secondary | ICD-10-CM | POA: Diagnosis not present

## 2022-06-09 DIAGNOSIS — Z992 Dependence on renal dialysis: Secondary | ICD-10-CM | POA: Diagnosis not present

## 2022-06-11 DIAGNOSIS — N186 End stage renal disease: Secondary | ICD-10-CM | POA: Diagnosis not present

## 2022-06-11 DIAGNOSIS — Z992 Dependence on renal dialysis: Secondary | ICD-10-CM | POA: Diagnosis not present

## 2022-06-11 DIAGNOSIS — N2581 Secondary hyperparathyroidism of renal origin: Secondary | ICD-10-CM | POA: Diagnosis not present

## 2022-06-13 DIAGNOSIS — Z992 Dependence on renal dialysis: Secondary | ICD-10-CM | POA: Diagnosis not present

## 2022-06-13 DIAGNOSIS — N186 End stage renal disease: Secondary | ICD-10-CM | POA: Diagnosis not present

## 2022-06-13 DIAGNOSIS — N2581 Secondary hyperparathyroidism of renal origin: Secondary | ICD-10-CM | POA: Diagnosis not present

## 2022-06-16 DIAGNOSIS — N186 End stage renal disease: Secondary | ICD-10-CM | POA: Diagnosis not present

## 2022-06-16 DIAGNOSIS — N2581 Secondary hyperparathyroidism of renal origin: Secondary | ICD-10-CM | POA: Diagnosis not present

## 2022-06-16 DIAGNOSIS — Z992 Dependence on renal dialysis: Secondary | ICD-10-CM | POA: Diagnosis not present

## 2022-06-18 DIAGNOSIS — N186 End stage renal disease: Secondary | ICD-10-CM | POA: Diagnosis not present

## 2022-06-18 DIAGNOSIS — N2581 Secondary hyperparathyroidism of renal origin: Secondary | ICD-10-CM | POA: Diagnosis not present

## 2022-06-18 DIAGNOSIS — Z992 Dependence on renal dialysis: Secondary | ICD-10-CM | POA: Diagnosis not present

## 2022-06-20 DIAGNOSIS — N186 End stage renal disease: Secondary | ICD-10-CM | POA: Diagnosis not present

## 2022-06-20 DIAGNOSIS — Z992 Dependence on renal dialysis: Secondary | ICD-10-CM | POA: Diagnosis not present

## 2022-06-20 DIAGNOSIS — N2581 Secondary hyperparathyroidism of renal origin: Secondary | ICD-10-CM | POA: Diagnosis not present

## 2022-06-21 DIAGNOSIS — N186 End stage renal disease: Secondary | ICD-10-CM | POA: Diagnosis not present

## 2022-06-21 DIAGNOSIS — Z992 Dependence on renal dialysis: Secondary | ICD-10-CM | POA: Diagnosis not present

## 2022-06-21 DIAGNOSIS — Q612 Polycystic kidney, adult type: Secondary | ICD-10-CM | POA: Diagnosis not present

## 2022-06-23 DIAGNOSIS — N2581 Secondary hyperparathyroidism of renal origin: Secondary | ICD-10-CM | POA: Diagnosis not present

## 2022-06-23 DIAGNOSIS — N186 End stage renal disease: Secondary | ICD-10-CM | POA: Diagnosis not present

## 2022-06-23 DIAGNOSIS — Z992 Dependence on renal dialysis: Secondary | ICD-10-CM | POA: Diagnosis not present

## 2022-06-25 DIAGNOSIS — M4003 Postural kyphosis, cervicothoracic region: Secondary | ICD-10-CM | POA: Diagnosis not present

## 2022-06-25 DIAGNOSIS — M40209 Unspecified kyphosis, site unspecified: Secondary | ICD-10-CM | POA: Diagnosis not present

## 2022-06-25 DIAGNOSIS — Z992 Dependence on renal dialysis: Secondary | ICD-10-CM | POA: Diagnosis not present

## 2022-06-25 DIAGNOSIS — I509 Heart failure, unspecified: Secondary | ICD-10-CM | POA: Diagnosis not present

## 2022-06-25 DIAGNOSIS — N186 End stage renal disease: Secondary | ICD-10-CM | POA: Diagnosis not present

## 2022-06-25 DIAGNOSIS — N2581 Secondary hyperparathyroidism of renal origin: Secondary | ICD-10-CM | POA: Diagnosis not present

## 2022-06-25 DIAGNOSIS — G5603 Carpal tunnel syndrome, bilateral upper limbs: Secondary | ICD-10-CM | POA: Diagnosis not present

## 2022-06-27 DIAGNOSIS — N2581 Secondary hyperparathyroidism of renal origin: Secondary | ICD-10-CM | POA: Diagnosis not present

## 2022-06-27 DIAGNOSIS — Z992 Dependence on renal dialysis: Secondary | ICD-10-CM | POA: Diagnosis not present

## 2022-06-27 DIAGNOSIS — N186 End stage renal disease: Secondary | ICD-10-CM | POA: Diagnosis not present

## 2022-06-30 DIAGNOSIS — N186 End stage renal disease: Secondary | ICD-10-CM | POA: Diagnosis not present

## 2022-06-30 DIAGNOSIS — Z992 Dependence on renal dialysis: Secondary | ICD-10-CM | POA: Diagnosis not present

## 2022-06-30 DIAGNOSIS — N2581 Secondary hyperparathyroidism of renal origin: Secondary | ICD-10-CM | POA: Diagnosis not present

## 2022-07-02 DIAGNOSIS — Z992 Dependence on renal dialysis: Secondary | ICD-10-CM | POA: Diagnosis not present

## 2022-07-02 DIAGNOSIS — N2581 Secondary hyperparathyroidism of renal origin: Secondary | ICD-10-CM | POA: Diagnosis not present

## 2022-07-02 DIAGNOSIS — N186 End stage renal disease: Secondary | ICD-10-CM | POA: Diagnosis not present

## 2022-07-04 DIAGNOSIS — N2581 Secondary hyperparathyroidism of renal origin: Secondary | ICD-10-CM | POA: Diagnosis not present

## 2022-07-04 DIAGNOSIS — N186 End stage renal disease: Secondary | ICD-10-CM | POA: Diagnosis not present

## 2022-07-04 DIAGNOSIS — Z992 Dependence on renal dialysis: Secondary | ICD-10-CM | POA: Diagnosis not present

## 2022-07-06 DIAGNOSIS — M4005 Postural kyphosis, thoracolumbar region: Secondary | ICD-10-CM | POA: Diagnosis not present

## 2022-07-07 DIAGNOSIS — N186 End stage renal disease: Secondary | ICD-10-CM | POA: Diagnosis not present

## 2022-07-07 DIAGNOSIS — N2581 Secondary hyperparathyroidism of renal origin: Secondary | ICD-10-CM | POA: Diagnosis not present

## 2022-07-07 DIAGNOSIS — Z992 Dependence on renal dialysis: Secondary | ICD-10-CM | POA: Diagnosis not present

## 2022-07-10 DIAGNOSIS — G5603 Carpal tunnel syndrome, bilateral upper limbs: Secondary | ICD-10-CM | POA: Diagnosis not present

## 2022-07-11 DIAGNOSIS — N2581 Secondary hyperparathyroidism of renal origin: Secondary | ICD-10-CM | POA: Diagnosis not present

## 2022-07-11 DIAGNOSIS — N186 End stage renal disease: Secondary | ICD-10-CM | POA: Diagnosis not present

## 2022-07-11 DIAGNOSIS — Z992 Dependence on renal dialysis: Secondary | ICD-10-CM | POA: Diagnosis not present

## 2022-07-14 DIAGNOSIS — N2581 Secondary hyperparathyroidism of renal origin: Secondary | ICD-10-CM | POA: Diagnosis not present

## 2022-07-14 DIAGNOSIS — N186 End stage renal disease: Secondary | ICD-10-CM | POA: Diagnosis not present

## 2022-07-14 DIAGNOSIS — Z992 Dependence on renal dialysis: Secondary | ICD-10-CM | POA: Diagnosis not present

## 2022-07-15 DIAGNOSIS — M4005 Postural kyphosis, thoracolumbar region: Secondary | ICD-10-CM | POA: Diagnosis not present

## 2022-07-16 DIAGNOSIS — N2581 Secondary hyperparathyroidism of renal origin: Secondary | ICD-10-CM | POA: Diagnosis not present

## 2022-07-16 DIAGNOSIS — N186 End stage renal disease: Secondary | ICD-10-CM | POA: Diagnosis not present

## 2022-07-16 DIAGNOSIS — Z992 Dependence on renal dialysis: Secondary | ICD-10-CM | POA: Diagnosis not present

## 2022-07-18 DIAGNOSIS — Z992 Dependence on renal dialysis: Secondary | ICD-10-CM | POA: Diagnosis not present

## 2022-07-18 DIAGNOSIS — N186 End stage renal disease: Secondary | ICD-10-CM | POA: Diagnosis not present

## 2022-07-18 DIAGNOSIS — N2581 Secondary hyperparathyroidism of renal origin: Secondary | ICD-10-CM | POA: Diagnosis not present

## 2022-07-20 DIAGNOSIS — G5602 Carpal tunnel syndrome, left upper limb: Secondary | ICD-10-CM | POA: Diagnosis not present

## 2022-07-22 DIAGNOSIS — Z992 Dependence on renal dialysis: Secondary | ICD-10-CM | POA: Diagnosis not present

## 2022-07-22 DIAGNOSIS — Q612 Polycystic kidney, adult type: Secondary | ICD-10-CM | POA: Diagnosis not present

## 2022-07-22 DIAGNOSIS — N186 End stage renal disease: Secondary | ICD-10-CM | POA: Diagnosis not present

## 2022-07-23 DIAGNOSIS — N186 End stage renal disease: Secondary | ICD-10-CM | POA: Diagnosis not present

## 2022-07-23 DIAGNOSIS — N2581 Secondary hyperparathyroidism of renal origin: Secondary | ICD-10-CM | POA: Diagnosis not present

## 2022-07-23 DIAGNOSIS — D631 Anemia in chronic kidney disease: Secondary | ICD-10-CM | POA: Diagnosis not present

## 2022-07-23 DIAGNOSIS — Z992 Dependence on renal dialysis: Secondary | ICD-10-CM | POA: Diagnosis not present

## 2022-07-25 DIAGNOSIS — D631 Anemia in chronic kidney disease: Secondary | ICD-10-CM | POA: Diagnosis not present

## 2022-07-25 DIAGNOSIS — Z992 Dependence on renal dialysis: Secondary | ICD-10-CM | POA: Diagnosis not present

## 2022-07-25 DIAGNOSIS — N186 End stage renal disease: Secondary | ICD-10-CM | POA: Diagnosis not present

## 2022-07-25 DIAGNOSIS — N2581 Secondary hyperparathyroidism of renal origin: Secondary | ICD-10-CM | POA: Diagnosis not present

## 2022-07-30 DIAGNOSIS — D631 Anemia in chronic kidney disease: Secondary | ICD-10-CM | POA: Diagnosis not present

## 2022-07-30 DIAGNOSIS — N2581 Secondary hyperparathyroidism of renal origin: Secondary | ICD-10-CM | POA: Diagnosis not present

## 2022-07-30 DIAGNOSIS — N186 End stage renal disease: Secondary | ICD-10-CM | POA: Diagnosis not present

## 2022-07-30 DIAGNOSIS — Z992 Dependence on renal dialysis: Secondary | ICD-10-CM | POA: Diagnosis not present

## 2022-08-01 DIAGNOSIS — D631 Anemia in chronic kidney disease: Secondary | ICD-10-CM | POA: Diagnosis not present

## 2022-08-01 DIAGNOSIS — N186 End stage renal disease: Secondary | ICD-10-CM | POA: Diagnosis not present

## 2022-08-01 DIAGNOSIS — N2581 Secondary hyperparathyroidism of renal origin: Secondary | ICD-10-CM | POA: Diagnosis not present

## 2022-08-01 DIAGNOSIS — Z992 Dependence on renal dialysis: Secondary | ICD-10-CM | POA: Diagnosis not present

## 2022-08-04 DIAGNOSIS — Z992 Dependence on renal dialysis: Secondary | ICD-10-CM | POA: Diagnosis not present

## 2022-08-04 DIAGNOSIS — D631 Anemia in chronic kidney disease: Secondary | ICD-10-CM | POA: Diagnosis not present

## 2022-08-04 DIAGNOSIS — N186 End stage renal disease: Secondary | ICD-10-CM | POA: Diagnosis not present

## 2022-08-04 DIAGNOSIS — N2581 Secondary hyperparathyroidism of renal origin: Secondary | ICD-10-CM | POA: Diagnosis not present

## 2022-08-08 DIAGNOSIS — D631 Anemia in chronic kidney disease: Secondary | ICD-10-CM | POA: Diagnosis not present

## 2022-08-08 DIAGNOSIS — Z992 Dependence on renal dialysis: Secondary | ICD-10-CM | POA: Diagnosis not present

## 2022-08-08 DIAGNOSIS — N2581 Secondary hyperparathyroidism of renal origin: Secondary | ICD-10-CM | POA: Diagnosis not present

## 2022-08-08 DIAGNOSIS — N186 End stage renal disease: Secondary | ICD-10-CM | POA: Diagnosis not present

## 2022-08-10 DIAGNOSIS — N186 End stage renal disease: Secondary | ICD-10-CM | POA: Diagnosis not present

## 2022-08-10 DIAGNOSIS — D631 Anemia in chronic kidney disease: Secondary | ICD-10-CM | POA: Diagnosis not present

## 2022-08-10 DIAGNOSIS — N2581 Secondary hyperparathyroidism of renal origin: Secondary | ICD-10-CM | POA: Diagnosis not present

## 2022-08-10 DIAGNOSIS — Z992 Dependence on renal dialysis: Secondary | ICD-10-CM | POA: Diagnosis not present

## 2022-08-12 DIAGNOSIS — Z992 Dependence on renal dialysis: Secondary | ICD-10-CM | POA: Diagnosis not present

## 2022-08-12 DIAGNOSIS — N2581 Secondary hyperparathyroidism of renal origin: Secondary | ICD-10-CM | POA: Diagnosis not present

## 2022-08-12 DIAGNOSIS — D631 Anemia in chronic kidney disease: Secondary | ICD-10-CM | POA: Diagnosis not present

## 2022-08-12 DIAGNOSIS — N186 End stage renal disease: Secondary | ICD-10-CM | POA: Diagnosis not present

## 2022-08-15 DIAGNOSIS — N186 End stage renal disease: Secondary | ICD-10-CM | POA: Diagnosis not present

## 2022-08-15 DIAGNOSIS — Z992 Dependence on renal dialysis: Secondary | ICD-10-CM | POA: Diagnosis not present

## 2022-08-15 DIAGNOSIS — N2581 Secondary hyperparathyroidism of renal origin: Secondary | ICD-10-CM | POA: Diagnosis not present

## 2022-08-15 DIAGNOSIS — D631 Anemia in chronic kidney disease: Secondary | ICD-10-CM | POA: Diagnosis not present

## 2022-08-18 DIAGNOSIS — N2581 Secondary hyperparathyroidism of renal origin: Secondary | ICD-10-CM | POA: Diagnosis not present

## 2022-08-18 DIAGNOSIS — Z992 Dependence on renal dialysis: Secondary | ICD-10-CM | POA: Diagnosis not present

## 2022-08-18 DIAGNOSIS — D631 Anemia in chronic kidney disease: Secondary | ICD-10-CM | POA: Diagnosis not present

## 2022-08-18 DIAGNOSIS — N186 End stage renal disease: Secondary | ICD-10-CM | POA: Diagnosis not present

## 2022-08-20 DIAGNOSIS — D631 Anemia in chronic kidney disease: Secondary | ICD-10-CM | POA: Diagnosis not present

## 2022-08-20 DIAGNOSIS — N186 End stage renal disease: Secondary | ICD-10-CM | POA: Diagnosis not present

## 2022-08-20 DIAGNOSIS — Z992 Dependence on renal dialysis: Secondary | ICD-10-CM | POA: Diagnosis not present

## 2022-08-20 DIAGNOSIS — N2581 Secondary hyperparathyroidism of renal origin: Secondary | ICD-10-CM | POA: Diagnosis not present

## 2022-08-21 DIAGNOSIS — G5602 Carpal tunnel syndrome, left upper limb: Secondary | ICD-10-CM | POA: Diagnosis not present

## 2022-08-22 DIAGNOSIS — N2581 Secondary hyperparathyroidism of renal origin: Secondary | ICD-10-CM | POA: Diagnosis not present

## 2022-08-22 DIAGNOSIS — N186 End stage renal disease: Secondary | ICD-10-CM | POA: Diagnosis not present

## 2022-08-22 DIAGNOSIS — Z992 Dependence on renal dialysis: Secondary | ICD-10-CM | POA: Diagnosis not present

## 2022-08-24 DIAGNOSIS — G5602 Carpal tunnel syndrome, left upper limb: Secondary | ICD-10-CM | POA: Diagnosis not present

## 2022-08-25 DIAGNOSIS — N186 End stage renal disease: Secondary | ICD-10-CM | POA: Diagnosis not present

## 2022-08-25 DIAGNOSIS — N2581 Secondary hyperparathyroidism of renal origin: Secondary | ICD-10-CM | POA: Diagnosis not present

## 2022-08-25 DIAGNOSIS — Z992 Dependence on renal dialysis: Secondary | ICD-10-CM | POA: Diagnosis not present

## 2022-08-29 DIAGNOSIS — N2581 Secondary hyperparathyroidism of renal origin: Secondary | ICD-10-CM | POA: Diagnosis not present

## 2022-08-29 DIAGNOSIS — N186 End stage renal disease: Secondary | ICD-10-CM | POA: Diagnosis not present

## 2022-08-29 DIAGNOSIS — Z992 Dependence on renal dialysis: Secondary | ICD-10-CM | POA: Diagnosis not present

## 2022-09-01 DIAGNOSIS — N2581 Secondary hyperparathyroidism of renal origin: Secondary | ICD-10-CM | POA: Diagnosis not present

## 2022-09-01 DIAGNOSIS — Z992 Dependence on renal dialysis: Secondary | ICD-10-CM | POA: Diagnosis not present

## 2022-09-01 DIAGNOSIS — N186 End stage renal disease: Secondary | ICD-10-CM | POA: Diagnosis not present

## 2022-09-02 DIAGNOSIS — M25532 Pain in left wrist: Secondary | ICD-10-CM | POA: Diagnosis not present

## 2022-09-03 DIAGNOSIS — Z992 Dependence on renal dialysis: Secondary | ICD-10-CM | POA: Diagnosis not present

## 2022-09-03 DIAGNOSIS — N186 End stage renal disease: Secondary | ICD-10-CM | POA: Diagnosis not present

## 2022-09-03 DIAGNOSIS — N2581 Secondary hyperparathyroidism of renal origin: Secondary | ICD-10-CM | POA: Diagnosis not present

## 2022-09-05 DIAGNOSIS — N186 End stage renal disease: Secondary | ICD-10-CM | POA: Diagnosis not present

## 2022-09-05 DIAGNOSIS — Z992 Dependence on renal dialysis: Secondary | ICD-10-CM | POA: Diagnosis not present

## 2022-09-05 DIAGNOSIS — N2581 Secondary hyperparathyroidism of renal origin: Secondary | ICD-10-CM | POA: Diagnosis not present

## 2022-09-07 DIAGNOSIS — M25532 Pain in left wrist: Secondary | ICD-10-CM | POA: Diagnosis not present

## 2022-09-08 DIAGNOSIS — N2581 Secondary hyperparathyroidism of renal origin: Secondary | ICD-10-CM | POA: Diagnosis not present

## 2022-09-08 DIAGNOSIS — N186 End stage renal disease: Secondary | ICD-10-CM | POA: Diagnosis not present

## 2022-09-08 DIAGNOSIS — Z992 Dependence on renal dialysis: Secondary | ICD-10-CM | POA: Diagnosis not present

## 2022-09-11 DIAGNOSIS — M25532 Pain in left wrist: Secondary | ICD-10-CM | POA: Diagnosis not present

## 2022-09-12 DIAGNOSIS — Z992 Dependence on renal dialysis: Secondary | ICD-10-CM | POA: Diagnosis not present

## 2022-09-12 DIAGNOSIS — N186 End stage renal disease: Secondary | ICD-10-CM | POA: Diagnosis not present

## 2022-09-12 DIAGNOSIS — N2581 Secondary hyperparathyroidism of renal origin: Secondary | ICD-10-CM | POA: Diagnosis not present

## 2022-09-15 DIAGNOSIS — N186 End stage renal disease: Secondary | ICD-10-CM | POA: Diagnosis not present

## 2022-09-15 DIAGNOSIS — Z992 Dependence on renal dialysis: Secondary | ICD-10-CM | POA: Diagnosis not present

## 2022-09-15 DIAGNOSIS — N2581 Secondary hyperparathyroidism of renal origin: Secondary | ICD-10-CM | POA: Diagnosis not present

## 2022-09-17 DIAGNOSIS — Z992 Dependence on renal dialysis: Secondary | ICD-10-CM | POA: Diagnosis not present

## 2022-09-17 DIAGNOSIS — N186 End stage renal disease: Secondary | ICD-10-CM | POA: Diagnosis not present

## 2022-09-17 DIAGNOSIS — N2581 Secondary hyperparathyroidism of renal origin: Secondary | ICD-10-CM | POA: Diagnosis not present

## 2022-09-19 DIAGNOSIS — Z992 Dependence on renal dialysis: Secondary | ICD-10-CM | POA: Diagnosis not present

## 2022-09-19 DIAGNOSIS — N186 End stage renal disease: Secondary | ICD-10-CM | POA: Diagnosis not present

## 2022-09-19 DIAGNOSIS — N2581 Secondary hyperparathyroidism of renal origin: Secondary | ICD-10-CM | POA: Diagnosis not present

## 2022-09-21 DIAGNOSIS — N186 End stage renal disease: Secondary | ICD-10-CM | POA: Diagnosis not present

## 2022-09-21 DIAGNOSIS — Z992 Dependence on renal dialysis: Secondary | ICD-10-CM | POA: Diagnosis not present

## 2022-09-21 DIAGNOSIS — Q612 Polycystic kidney, adult type: Secondary | ICD-10-CM | POA: Diagnosis not present

## 2022-09-22 DIAGNOSIS — N2581 Secondary hyperparathyroidism of renal origin: Secondary | ICD-10-CM | POA: Diagnosis not present

## 2022-09-22 DIAGNOSIS — N186 End stage renal disease: Secondary | ICD-10-CM | POA: Diagnosis not present

## 2022-09-22 DIAGNOSIS — Z992 Dependence on renal dialysis: Secondary | ICD-10-CM | POA: Diagnosis not present

## 2022-09-24 DIAGNOSIS — N186 End stage renal disease: Secondary | ICD-10-CM | POA: Diagnosis not present

## 2022-09-24 DIAGNOSIS — N2581 Secondary hyperparathyroidism of renal origin: Secondary | ICD-10-CM | POA: Diagnosis not present

## 2022-09-24 DIAGNOSIS — Z992 Dependence on renal dialysis: Secondary | ICD-10-CM | POA: Diagnosis not present

## 2022-09-26 DIAGNOSIS — N186 End stage renal disease: Secondary | ICD-10-CM | POA: Diagnosis not present

## 2022-09-26 DIAGNOSIS — Z992 Dependence on renal dialysis: Secondary | ICD-10-CM | POA: Diagnosis not present

## 2022-09-26 DIAGNOSIS — N2581 Secondary hyperparathyroidism of renal origin: Secondary | ICD-10-CM | POA: Diagnosis not present

## 2022-09-28 DIAGNOSIS — N186 End stage renal disease: Secondary | ICD-10-CM | POA: Diagnosis not present

## 2022-09-28 DIAGNOSIS — I509 Heart failure, unspecified: Secondary | ICD-10-CM | POA: Diagnosis not present

## 2022-09-28 DIAGNOSIS — N2581 Secondary hyperparathyroidism of renal origin: Secondary | ICD-10-CM | POA: Diagnosis not present

## 2022-09-28 DIAGNOSIS — D8481 Immunodeficiency due to conditions classified elsewhere: Secondary | ICD-10-CM | POA: Diagnosis not present

## 2022-09-28 DIAGNOSIS — M810 Age-related osteoporosis without current pathological fracture: Secondary | ICD-10-CM | POA: Diagnosis not present

## 2022-09-28 DIAGNOSIS — R011 Cardiac murmur, unspecified: Secondary | ICD-10-CM | POA: Diagnosis not present

## 2022-09-28 DIAGNOSIS — I272 Pulmonary hypertension, unspecified: Secondary | ICD-10-CM | POA: Diagnosis not present

## 2022-09-28 DIAGNOSIS — I700 Atherosclerosis of aorta: Secondary | ICD-10-CM | POA: Diagnosis not present

## 2022-09-28 DIAGNOSIS — G5603 Carpal tunnel syndrome, bilateral upper limbs: Secondary | ICD-10-CM | POA: Diagnosis not present

## 2022-09-28 DIAGNOSIS — D696 Thrombocytopenia, unspecified: Secondary | ICD-10-CM | POA: Diagnosis not present

## 2022-09-28 DIAGNOSIS — Z992 Dependence on renal dialysis: Secondary | ICD-10-CM | POA: Diagnosis not present

## 2022-09-29 ENCOUNTER — Other Ambulatory Visit: Payer: Self-pay | Admitting: Adult Health

## 2022-09-29 ENCOUNTER — Encounter: Payer: Self-pay | Admitting: Adult Health

## 2022-09-29 DIAGNOSIS — N186 End stage renal disease: Secondary | ICD-10-CM | POA: Diagnosis not present

## 2022-09-29 DIAGNOSIS — M810 Age-related osteoporosis without current pathological fracture: Secondary | ICD-10-CM

## 2022-09-29 DIAGNOSIS — Z992 Dependence on renal dialysis: Secondary | ICD-10-CM | POA: Diagnosis not present

## 2022-09-29 DIAGNOSIS — N2581 Secondary hyperparathyroidism of renal origin: Secondary | ICD-10-CM | POA: Diagnosis not present

## 2022-09-30 ENCOUNTER — Other Ambulatory Visit: Payer: Self-pay | Admitting: Adult Health

## 2022-09-30 DIAGNOSIS — Z8739 Personal history of other diseases of the musculoskeletal system and connective tissue: Secondary | ICD-10-CM

## 2022-10-01 DIAGNOSIS — N186 End stage renal disease: Secondary | ICD-10-CM | POA: Diagnosis not present

## 2022-10-01 DIAGNOSIS — Z992 Dependence on renal dialysis: Secondary | ICD-10-CM | POA: Diagnosis not present

## 2022-10-01 DIAGNOSIS — N2581 Secondary hyperparathyroidism of renal origin: Secondary | ICD-10-CM | POA: Diagnosis not present

## 2022-10-02 ENCOUNTER — Inpatient Hospital Stay: Admission: RE | Admit: 2022-10-02 | Payer: Medicare Other | Source: Ambulatory Visit

## 2022-10-03 DIAGNOSIS — N2581 Secondary hyperparathyroidism of renal origin: Secondary | ICD-10-CM | POA: Diagnosis not present

## 2022-10-03 DIAGNOSIS — N186 End stage renal disease: Secondary | ICD-10-CM | POA: Diagnosis not present

## 2022-10-03 DIAGNOSIS — Z992 Dependence on renal dialysis: Secondary | ICD-10-CM | POA: Diagnosis not present

## 2022-10-06 DIAGNOSIS — Z992 Dependence on renal dialysis: Secondary | ICD-10-CM | POA: Diagnosis not present

## 2022-10-06 DIAGNOSIS — N186 End stage renal disease: Secondary | ICD-10-CM | POA: Diagnosis not present

## 2022-10-06 DIAGNOSIS — N2581 Secondary hyperparathyroidism of renal origin: Secondary | ICD-10-CM | POA: Diagnosis not present

## 2022-10-10 DIAGNOSIS — N186 End stage renal disease: Secondary | ICD-10-CM | POA: Diagnosis not present

## 2022-10-10 DIAGNOSIS — N2581 Secondary hyperparathyroidism of renal origin: Secondary | ICD-10-CM | POA: Diagnosis not present

## 2022-10-10 DIAGNOSIS — Z992 Dependence on renal dialysis: Secondary | ICD-10-CM | POA: Diagnosis not present

## 2022-10-13 DIAGNOSIS — Z992 Dependence on renal dialysis: Secondary | ICD-10-CM | POA: Diagnosis not present

## 2022-10-13 DIAGNOSIS — N186 End stage renal disease: Secondary | ICD-10-CM | POA: Diagnosis not present

## 2022-10-13 DIAGNOSIS — N2581 Secondary hyperparathyroidism of renal origin: Secondary | ICD-10-CM | POA: Diagnosis not present

## 2022-10-15 DIAGNOSIS — N186 End stage renal disease: Secondary | ICD-10-CM | POA: Diagnosis not present

## 2022-10-15 DIAGNOSIS — N2581 Secondary hyperparathyroidism of renal origin: Secondary | ICD-10-CM | POA: Diagnosis not present

## 2022-10-15 DIAGNOSIS — Z992 Dependence on renal dialysis: Secondary | ICD-10-CM | POA: Diagnosis not present

## 2022-10-17 DIAGNOSIS — N2581 Secondary hyperparathyroidism of renal origin: Secondary | ICD-10-CM | POA: Diagnosis not present

## 2022-10-17 DIAGNOSIS — Z992 Dependence on renal dialysis: Secondary | ICD-10-CM | POA: Diagnosis not present

## 2022-10-17 DIAGNOSIS — N186 End stage renal disease: Secondary | ICD-10-CM | POA: Diagnosis not present

## 2022-10-20 DIAGNOSIS — N186 End stage renal disease: Secondary | ICD-10-CM | POA: Diagnosis not present

## 2022-10-20 DIAGNOSIS — Z992 Dependence on renal dialysis: Secondary | ICD-10-CM | POA: Diagnosis not present

## 2022-10-20 DIAGNOSIS — N2581 Secondary hyperparathyroidism of renal origin: Secondary | ICD-10-CM | POA: Diagnosis not present

## 2022-10-22 DIAGNOSIS — Q612 Polycystic kidney, adult type: Secondary | ICD-10-CM | POA: Diagnosis not present

## 2022-10-22 DIAGNOSIS — N186 End stage renal disease: Secondary | ICD-10-CM | POA: Diagnosis not present

## 2022-10-22 DIAGNOSIS — Z992 Dependence on renal dialysis: Secondary | ICD-10-CM | POA: Diagnosis not present

## 2022-10-24 DIAGNOSIS — Z992 Dependence on renal dialysis: Secondary | ICD-10-CM | POA: Diagnosis not present

## 2022-10-24 DIAGNOSIS — N2581 Secondary hyperparathyroidism of renal origin: Secondary | ICD-10-CM | POA: Diagnosis not present

## 2022-10-24 DIAGNOSIS — N186 End stage renal disease: Secondary | ICD-10-CM | POA: Diagnosis not present

## 2022-10-27 DIAGNOSIS — N186 End stage renal disease: Secondary | ICD-10-CM | POA: Diagnosis not present

## 2022-10-27 DIAGNOSIS — N2581 Secondary hyperparathyroidism of renal origin: Secondary | ICD-10-CM | POA: Diagnosis not present

## 2022-10-27 DIAGNOSIS — Z992 Dependence on renal dialysis: Secondary | ICD-10-CM | POA: Diagnosis not present

## 2022-10-29 DIAGNOSIS — N186 End stage renal disease: Secondary | ICD-10-CM | POA: Diagnosis not present

## 2022-10-29 DIAGNOSIS — N2581 Secondary hyperparathyroidism of renal origin: Secondary | ICD-10-CM | POA: Diagnosis not present

## 2022-10-29 DIAGNOSIS — Z992 Dependence on renal dialysis: Secondary | ICD-10-CM | POA: Diagnosis not present

## 2022-10-30 DIAGNOSIS — G5602 Carpal tunnel syndrome, left upper limb: Secondary | ICD-10-CM | POA: Diagnosis not present

## 2022-10-31 DIAGNOSIS — Z992 Dependence on renal dialysis: Secondary | ICD-10-CM | POA: Diagnosis not present

## 2022-10-31 DIAGNOSIS — N186 End stage renal disease: Secondary | ICD-10-CM | POA: Diagnosis not present

## 2022-10-31 DIAGNOSIS — N2581 Secondary hyperparathyroidism of renal origin: Secondary | ICD-10-CM | POA: Diagnosis not present

## 2022-11-03 DIAGNOSIS — N2581 Secondary hyperparathyroidism of renal origin: Secondary | ICD-10-CM | POA: Diagnosis not present

## 2022-11-03 DIAGNOSIS — N186 End stage renal disease: Secondary | ICD-10-CM | POA: Diagnosis not present

## 2022-11-03 DIAGNOSIS — Z992 Dependence on renal dialysis: Secondary | ICD-10-CM | POA: Diagnosis not present

## 2022-11-05 DIAGNOSIS — N186 End stage renal disease: Secondary | ICD-10-CM | POA: Diagnosis not present

## 2022-11-05 DIAGNOSIS — N2581 Secondary hyperparathyroidism of renal origin: Secondary | ICD-10-CM | POA: Diagnosis not present

## 2022-11-05 DIAGNOSIS — Z992 Dependence on renal dialysis: Secondary | ICD-10-CM | POA: Diagnosis not present

## 2022-11-07 DIAGNOSIS — N2581 Secondary hyperparathyroidism of renal origin: Secondary | ICD-10-CM | POA: Diagnosis not present

## 2022-11-07 DIAGNOSIS — Z992 Dependence on renal dialysis: Secondary | ICD-10-CM | POA: Diagnosis not present

## 2022-11-07 DIAGNOSIS — N186 End stage renal disease: Secondary | ICD-10-CM | POA: Diagnosis not present

## 2022-11-10 DIAGNOSIS — N186 End stage renal disease: Secondary | ICD-10-CM | POA: Diagnosis not present

## 2022-11-10 DIAGNOSIS — Z992 Dependence on renal dialysis: Secondary | ICD-10-CM | POA: Diagnosis not present

## 2022-11-10 DIAGNOSIS — N2581 Secondary hyperparathyroidism of renal origin: Secondary | ICD-10-CM | POA: Diagnosis not present

## 2022-11-14 DIAGNOSIS — N186 End stage renal disease: Secondary | ICD-10-CM | POA: Diagnosis not present

## 2022-11-14 DIAGNOSIS — N2581 Secondary hyperparathyroidism of renal origin: Secondary | ICD-10-CM | POA: Diagnosis not present

## 2022-11-14 DIAGNOSIS — Z992 Dependence on renal dialysis: Secondary | ICD-10-CM | POA: Diagnosis not present

## 2022-11-16 DIAGNOSIS — M79642 Pain in left hand: Secondary | ICD-10-CM | POA: Diagnosis not present

## 2022-11-17 DIAGNOSIS — N186 End stage renal disease: Secondary | ICD-10-CM | POA: Diagnosis not present

## 2022-11-17 DIAGNOSIS — N2581 Secondary hyperparathyroidism of renal origin: Secondary | ICD-10-CM | POA: Diagnosis not present

## 2022-11-17 DIAGNOSIS — Z992 Dependence on renal dialysis: Secondary | ICD-10-CM | POA: Diagnosis not present

## 2022-11-19 DIAGNOSIS — N186 End stage renal disease: Secondary | ICD-10-CM | POA: Diagnosis not present

## 2022-11-19 DIAGNOSIS — N2581 Secondary hyperparathyroidism of renal origin: Secondary | ICD-10-CM | POA: Diagnosis not present

## 2022-11-19 DIAGNOSIS — Z992 Dependence on renal dialysis: Secondary | ICD-10-CM | POA: Diagnosis not present

## 2022-11-20 DIAGNOSIS — Q612 Polycystic kidney, adult type: Secondary | ICD-10-CM | POA: Diagnosis not present

## 2022-11-20 DIAGNOSIS — N186 End stage renal disease: Secondary | ICD-10-CM | POA: Diagnosis not present

## 2022-11-20 DIAGNOSIS — Z992 Dependence on renal dialysis: Secondary | ICD-10-CM | POA: Diagnosis not present

## 2022-11-21 DIAGNOSIS — N2581 Secondary hyperparathyroidism of renal origin: Secondary | ICD-10-CM | POA: Diagnosis not present

## 2022-11-21 DIAGNOSIS — Z992 Dependence on renal dialysis: Secondary | ICD-10-CM | POA: Diagnosis not present

## 2022-11-21 DIAGNOSIS — N186 End stage renal disease: Secondary | ICD-10-CM | POA: Diagnosis not present

## 2022-11-24 DIAGNOSIS — N186 End stage renal disease: Secondary | ICD-10-CM | POA: Diagnosis not present

## 2022-11-24 DIAGNOSIS — Z992 Dependence on renal dialysis: Secondary | ICD-10-CM | POA: Diagnosis not present

## 2022-11-24 DIAGNOSIS — N2581 Secondary hyperparathyroidism of renal origin: Secondary | ICD-10-CM | POA: Diagnosis not present

## 2022-11-26 DIAGNOSIS — N186 End stage renal disease: Secondary | ICD-10-CM | POA: Diagnosis not present

## 2022-11-26 DIAGNOSIS — Z992 Dependence on renal dialysis: Secondary | ICD-10-CM | POA: Diagnosis not present

## 2022-11-26 DIAGNOSIS — N2581 Secondary hyperparathyroidism of renal origin: Secondary | ICD-10-CM | POA: Diagnosis not present

## 2022-11-27 DIAGNOSIS — M79642 Pain in left hand: Secondary | ICD-10-CM | POA: Diagnosis not present

## 2022-11-28 DIAGNOSIS — N2581 Secondary hyperparathyroidism of renal origin: Secondary | ICD-10-CM | POA: Diagnosis not present

## 2022-11-28 DIAGNOSIS — Z992 Dependence on renal dialysis: Secondary | ICD-10-CM | POA: Diagnosis not present

## 2022-11-28 DIAGNOSIS — N186 End stage renal disease: Secondary | ICD-10-CM | POA: Diagnosis not present

## 2022-12-01 DIAGNOSIS — N186 End stage renal disease: Secondary | ICD-10-CM | POA: Diagnosis not present

## 2022-12-01 DIAGNOSIS — N2581 Secondary hyperparathyroidism of renal origin: Secondary | ICD-10-CM | POA: Diagnosis not present

## 2022-12-01 DIAGNOSIS — Z992 Dependence on renal dialysis: Secondary | ICD-10-CM | POA: Diagnosis not present

## 2022-12-04 DIAGNOSIS — G5603 Carpal tunnel syndrome, bilateral upper limbs: Secondary | ICD-10-CM | POA: Diagnosis not present

## 2022-12-05 DIAGNOSIS — Z992 Dependence on renal dialysis: Secondary | ICD-10-CM | POA: Diagnosis not present

## 2022-12-05 DIAGNOSIS — N2581 Secondary hyperparathyroidism of renal origin: Secondary | ICD-10-CM | POA: Diagnosis not present

## 2022-12-05 DIAGNOSIS — N186 End stage renal disease: Secondary | ICD-10-CM | POA: Diagnosis not present

## 2022-12-08 DIAGNOSIS — N186 End stage renal disease: Secondary | ICD-10-CM | POA: Diagnosis not present

## 2022-12-08 DIAGNOSIS — N2581 Secondary hyperparathyroidism of renal origin: Secondary | ICD-10-CM | POA: Diagnosis not present

## 2022-12-08 DIAGNOSIS — Z992 Dependence on renal dialysis: Secondary | ICD-10-CM | POA: Diagnosis not present

## 2022-12-10 DIAGNOSIS — N2581 Secondary hyperparathyroidism of renal origin: Secondary | ICD-10-CM | POA: Diagnosis not present

## 2022-12-10 DIAGNOSIS — Z992 Dependence on renal dialysis: Secondary | ICD-10-CM | POA: Diagnosis not present

## 2022-12-10 DIAGNOSIS — N186 End stage renal disease: Secondary | ICD-10-CM | POA: Diagnosis not present

## 2022-12-12 DIAGNOSIS — Z992 Dependence on renal dialysis: Secondary | ICD-10-CM | POA: Diagnosis not present

## 2022-12-12 DIAGNOSIS — N2581 Secondary hyperparathyroidism of renal origin: Secondary | ICD-10-CM | POA: Diagnosis not present

## 2022-12-12 DIAGNOSIS — N186 End stage renal disease: Secondary | ICD-10-CM | POA: Diagnosis not present

## 2022-12-15 DIAGNOSIS — N2581 Secondary hyperparathyroidism of renal origin: Secondary | ICD-10-CM | POA: Diagnosis not present

## 2022-12-15 DIAGNOSIS — Z992 Dependence on renal dialysis: Secondary | ICD-10-CM | POA: Diagnosis not present

## 2022-12-15 DIAGNOSIS — N186 End stage renal disease: Secondary | ICD-10-CM | POA: Diagnosis not present

## 2022-12-19 DIAGNOSIS — N2581 Secondary hyperparathyroidism of renal origin: Secondary | ICD-10-CM | POA: Diagnosis not present

## 2022-12-19 DIAGNOSIS — N186 End stage renal disease: Secondary | ICD-10-CM | POA: Diagnosis not present

## 2022-12-19 DIAGNOSIS — Z992 Dependence on renal dialysis: Secondary | ICD-10-CM | POA: Diagnosis not present

## 2022-12-21 DIAGNOSIS — Z992 Dependence on renal dialysis: Secondary | ICD-10-CM | POA: Diagnosis not present

## 2022-12-21 DIAGNOSIS — N186 End stage renal disease: Secondary | ICD-10-CM | POA: Diagnosis not present

## 2022-12-21 DIAGNOSIS — Q612 Polycystic kidney, adult type: Secondary | ICD-10-CM | POA: Diagnosis not present

## 2022-12-22 DIAGNOSIS — T82858A Stenosis of vascular prosthetic devices, implants and grafts, initial encounter: Secondary | ICD-10-CM | POA: Diagnosis not present

## 2022-12-22 DIAGNOSIS — Z992 Dependence on renal dialysis: Secondary | ICD-10-CM | POA: Diagnosis not present

## 2022-12-22 DIAGNOSIS — N186 End stage renal disease: Secondary | ICD-10-CM | POA: Diagnosis not present

## 2022-12-22 DIAGNOSIS — T82868A Thrombosis of vascular prosthetic devices, implants and grafts, initial encounter: Secondary | ICD-10-CM | POA: Diagnosis not present

## 2022-12-22 DIAGNOSIS — I871 Compression of vein: Secondary | ICD-10-CM | POA: Diagnosis not present

## 2022-12-23 DIAGNOSIS — Z992 Dependence on renal dialysis: Secondary | ICD-10-CM | POA: Diagnosis not present

## 2022-12-23 DIAGNOSIS — N2581 Secondary hyperparathyroidism of renal origin: Secondary | ICD-10-CM | POA: Diagnosis not present

## 2022-12-23 DIAGNOSIS — N186 End stage renal disease: Secondary | ICD-10-CM | POA: Diagnosis not present

## 2022-12-26 DIAGNOSIS — Z992 Dependence on renal dialysis: Secondary | ICD-10-CM | POA: Diagnosis not present

## 2022-12-26 DIAGNOSIS — N186 End stage renal disease: Secondary | ICD-10-CM | POA: Diagnosis not present

## 2022-12-26 DIAGNOSIS — N2581 Secondary hyperparathyroidism of renal origin: Secondary | ICD-10-CM | POA: Diagnosis not present

## 2022-12-29 DIAGNOSIS — Z992 Dependence on renal dialysis: Secondary | ICD-10-CM | POA: Diagnosis not present

## 2022-12-29 DIAGNOSIS — N186 End stage renal disease: Secondary | ICD-10-CM | POA: Diagnosis not present

## 2022-12-29 DIAGNOSIS — N2581 Secondary hyperparathyroidism of renal origin: Secondary | ICD-10-CM | POA: Diagnosis not present

## 2022-12-31 DIAGNOSIS — Z992 Dependence on renal dialysis: Secondary | ICD-10-CM | POA: Diagnosis not present

## 2022-12-31 DIAGNOSIS — N186 End stage renal disease: Secondary | ICD-10-CM | POA: Diagnosis not present

## 2022-12-31 DIAGNOSIS — N2581 Secondary hyperparathyroidism of renal origin: Secondary | ICD-10-CM | POA: Diagnosis not present

## 2023-01-02 DIAGNOSIS — N2581 Secondary hyperparathyroidism of renal origin: Secondary | ICD-10-CM | POA: Diagnosis not present

## 2023-01-02 DIAGNOSIS — Z992 Dependence on renal dialysis: Secondary | ICD-10-CM | POA: Diagnosis not present

## 2023-01-02 DIAGNOSIS — N186 End stage renal disease: Secondary | ICD-10-CM | POA: Diagnosis not present

## 2023-01-05 DIAGNOSIS — N186 End stage renal disease: Secondary | ICD-10-CM | POA: Diagnosis not present

## 2023-01-05 DIAGNOSIS — N2581 Secondary hyperparathyroidism of renal origin: Secondary | ICD-10-CM | POA: Diagnosis not present

## 2023-01-05 DIAGNOSIS — Z992 Dependence on renal dialysis: Secondary | ICD-10-CM | POA: Diagnosis not present

## 2023-01-06 DIAGNOSIS — Z13220 Encounter for screening for lipoid disorders: Secondary | ICD-10-CM | POA: Diagnosis not present

## 2023-01-06 DIAGNOSIS — H269 Unspecified cataract: Secondary | ICD-10-CM | POA: Diagnosis not present

## 2023-01-06 DIAGNOSIS — Z992 Dependence on renal dialysis: Secondary | ICD-10-CM | POA: Diagnosis not present

## 2023-01-06 DIAGNOSIS — I700 Atherosclerosis of aorta: Secondary | ICD-10-CM | POA: Diagnosis not present

## 2023-01-06 DIAGNOSIS — M810 Age-related osteoporosis without current pathological fracture: Secondary | ICD-10-CM | POA: Diagnosis not present

## 2023-01-06 DIAGNOSIS — R011 Cardiac murmur, unspecified: Secondary | ICD-10-CM | POA: Diagnosis not present

## 2023-01-07 DIAGNOSIS — N2581 Secondary hyperparathyroidism of renal origin: Secondary | ICD-10-CM | POA: Diagnosis not present

## 2023-01-07 DIAGNOSIS — N186 End stage renal disease: Secondary | ICD-10-CM | POA: Diagnosis not present

## 2023-01-07 DIAGNOSIS — Z992 Dependence on renal dialysis: Secondary | ICD-10-CM | POA: Diagnosis not present

## 2023-01-09 DIAGNOSIS — N186 End stage renal disease: Secondary | ICD-10-CM | POA: Diagnosis not present

## 2023-01-09 DIAGNOSIS — N2581 Secondary hyperparathyroidism of renal origin: Secondary | ICD-10-CM | POA: Diagnosis not present

## 2023-01-09 DIAGNOSIS — Z992 Dependence on renal dialysis: Secondary | ICD-10-CM | POA: Diagnosis not present

## 2023-01-11 DIAGNOSIS — G5601 Carpal tunnel syndrome, right upper limb: Secondary | ICD-10-CM | POA: Diagnosis not present

## 2023-01-12 DIAGNOSIS — N186 End stage renal disease: Secondary | ICD-10-CM | POA: Diagnosis not present

## 2023-01-12 DIAGNOSIS — N2581 Secondary hyperparathyroidism of renal origin: Secondary | ICD-10-CM | POA: Diagnosis not present

## 2023-01-12 DIAGNOSIS — Z992 Dependence on renal dialysis: Secondary | ICD-10-CM | POA: Diagnosis not present

## 2023-01-15 DIAGNOSIS — Z992 Dependence on renal dialysis: Secondary | ICD-10-CM | POA: Diagnosis not present

## 2023-01-15 DIAGNOSIS — N186 End stage renal disease: Secondary | ICD-10-CM | POA: Diagnosis not present

## 2023-01-15 DIAGNOSIS — N2581 Secondary hyperparathyroidism of renal origin: Secondary | ICD-10-CM | POA: Diagnosis not present

## 2023-01-16 DIAGNOSIS — N186 End stage renal disease: Secondary | ICD-10-CM | POA: Diagnosis not present

## 2023-01-16 DIAGNOSIS — N2581 Secondary hyperparathyroidism of renal origin: Secondary | ICD-10-CM | POA: Diagnosis not present

## 2023-01-16 DIAGNOSIS — Z992 Dependence on renal dialysis: Secondary | ICD-10-CM | POA: Diagnosis not present

## 2023-01-19 DIAGNOSIS — Z992 Dependence on renal dialysis: Secondary | ICD-10-CM | POA: Diagnosis not present

## 2023-01-19 DIAGNOSIS — N2581 Secondary hyperparathyroidism of renal origin: Secondary | ICD-10-CM | POA: Diagnosis not present

## 2023-01-19 DIAGNOSIS — N186 End stage renal disease: Secondary | ICD-10-CM | POA: Diagnosis not present

## 2023-01-21 DIAGNOSIS — N186 End stage renal disease: Secondary | ICD-10-CM | POA: Diagnosis not present

## 2023-01-21 DIAGNOSIS — Z992 Dependence on renal dialysis: Secondary | ICD-10-CM | POA: Diagnosis not present

## 2023-01-21 DIAGNOSIS — N2581 Secondary hyperparathyroidism of renal origin: Secondary | ICD-10-CM | POA: Diagnosis not present

## 2023-01-23 DIAGNOSIS — N186 End stage renal disease: Secondary | ICD-10-CM | POA: Diagnosis not present

## 2023-01-23 DIAGNOSIS — Z992 Dependence on renal dialysis: Secondary | ICD-10-CM | POA: Diagnosis not present

## 2023-01-23 DIAGNOSIS — N2581 Secondary hyperparathyroidism of renal origin: Secondary | ICD-10-CM | POA: Diagnosis not present

## 2023-01-26 DIAGNOSIS — N2581 Secondary hyperparathyroidism of renal origin: Secondary | ICD-10-CM | POA: Diagnosis not present

## 2023-01-26 DIAGNOSIS — Z992 Dependence on renal dialysis: Secondary | ICD-10-CM | POA: Diagnosis not present

## 2023-01-26 DIAGNOSIS — N186 End stage renal disease: Secondary | ICD-10-CM | POA: Diagnosis not present

## 2023-01-30 DIAGNOSIS — Z992 Dependence on renal dialysis: Secondary | ICD-10-CM | POA: Diagnosis not present

## 2023-01-30 DIAGNOSIS — N2581 Secondary hyperparathyroidism of renal origin: Secondary | ICD-10-CM | POA: Diagnosis not present

## 2023-01-30 DIAGNOSIS — N186 End stage renal disease: Secondary | ICD-10-CM | POA: Diagnosis not present

## 2023-02-02 DIAGNOSIS — N2581 Secondary hyperparathyroidism of renal origin: Secondary | ICD-10-CM | POA: Diagnosis not present

## 2023-02-02 DIAGNOSIS — N186 End stage renal disease: Secondary | ICD-10-CM | POA: Diagnosis not present

## 2023-02-02 DIAGNOSIS — Z992 Dependence on renal dialysis: Secondary | ICD-10-CM | POA: Diagnosis not present

## 2023-02-04 DIAGNOSIS — Z992 Dependence on renal dialysis: Secondary | ICD-10-CM | POA: Diagnosis not present

## 2023-02-04 DIAGNOSIS — N186 End stage renal disease: Secondary | ICD-10-CM | POA: Diagnosis not present

## 2023-02-04 DIAGNOSIS — N2581 Secondary hyperparathyroidism of renal origin: Secondary | ICD-10-CM | POA: Diagnosis not present

## 2023-02-06 DIAGNOSIS — N186 End stage renal disease: Secondary | ICD-10-CM | POA: Diagnosis not present

## 2023-02-06 DIAGNOSIS — Z992 Dependence on renal dialysis: Secondary | ICD-10-CM | POA: Diagnosis not present

## 2023-02-06 DIAGNOSIS — N2581 Secondary hyperparathyroidism of renal origin: Secondary | ICD-10-CM | POA: Diagnosis not present

## 2023-02-09 DIAGNOSIS — N2581 Secondary hyperparathyroidism of renal origin: Secondary | ICD-10-CM | POA: Diagnosis not present

## 2023-02-09 DIAGNOSIS — N186 End stage renal disease: Secondary | ICD-10-CM | POA: Diagnosis not present

## 2023-02-09 DIAGNOSIS — Z992 Dependence on renal dialysis: Secondary | ICD-10-CM | POA: Diagnosis not present

## 2023-02-11 DIAGNOSIS — N2581 Secondary hyperparathyroidism of renal origin: Secondary | ICD-10-CM | POA: Diagnosis not present

## 2023-02-11 DIAGNOSIS — N186 End stage renal disease: Secondary | ICD-10-CM | POA: Diagnosis not present

## 2023-02-11 DIAGNOSIS — Z992 Dependence on renal dialysis: Secondary | ICD-10-CM | POA: Diagnosis not present

## 2023-02-13 DIAGNOSIS — N2581 Secondary hyperparathyroidism of renal origin: Secondary | ICD-10-CM | POA: Diagnosis not present

## 2023-02-13 DIAGNOSIS — N186 End stage renal disease: Secondary | ICD-10-CM | POA: Diagnosis not present

## 2023-02-13 DIAGNOSIS — Z992 Dependence on renal dialysis: Secondary | ICD-10-CM | POA: Diagnosis not present

## 2023-02-16 DIAGNOSIS — N186 End stage renal disease: Secondary | ICD-10-CM | POA: Diagnosis not present

## 2023-02-16 DIAGNOSIS — N2581 Secondary hyperparathyroidism of renal origin: Secondary | ICD-10-CM | POA: Diagnosis not present

## 2023-02-16 DIAGNOSIS — Z992 Dependence on renal dialysis: Secondary | ICD-10-CM | POA: Diagnosis not present

## 2023-02-20 DIAGNOSIS — Q612 Polycystic kidney, adult type: Secondary | ICD-10-CM | POA: Diagnosis not present

## 2023-02-20 DIAGNOSIS — N2581 Secondary hyperparathyroidism of renal origin: Secondary | ICD-10-CM | POA: Diagnosis not present

## 2023-02-20 DIAGNOSIS — N186 End stage renal disease: Secondary | ICD-10-CM | POA: Diagnosis not present

## 2023-02-20 DIAGNOSIS — Z992 Dependence on renal dialysis: Secondary | ICD-10-CM | POA: Diagnosis not present

## 2023-02-23 DIAGNOSIS — N186 End stage renal disease: Secondary | ICD-10-CM | POA: Diagnosis not present

## 2023-02-23 DIAGNOSIS — N2581 Secondary hyperparathyroidism of renal origin: Secondary | ICD-10-CM | POA: Diagnosis not present

## 2023-02-23 DIAGNOSIS — Z992 Dependence on renal dialysis: Secondary | ICD-10-CM | POA: Diagnosis not present

## 2023-02-25 DIAGNOSIS — Z992 Dependence on renal dialysis: Secondary | ICD-10-CM | POA: Diagnosis not present

## 2023-02-25 DIAGNOSIS — N2581 Secondary hyperparathyroidism of renal origin: Secondary | ICD-10-CM | POA: Diagnosis not present

## 2023-02-25 DIAGNOSIS — N186 End stage renal disease: Secondary | ICD-10-CM | POA: Diagnosis not present

## 2023-02-27 DIAGNOSIS — N186 End stage renal disease: Secondary | ICD-10-CM | POA: Diagnosis not present

## 2023-02-27 DIAGNOSIS — Z992 Dependence on renal dialysis: Secondary | ICD-10-CM | POA: Diagnosis not present

## 2023-02-27 DIAGNOSIS — N2581 Secondary hyperparathyroidism of renal origin: Secondary | ICD-10-CM | POA: Diagnosis not present

## 2023-03-02 DIAGNOSIS — N2581 Secondary hyperparathyroidism of renal origin: Secondary | ICD-10-CM | POA: Diagnosis not present

## 2023-03-02 DIAGNOSIS — Z992 Dependence on renal dialysis: Secondary | ICD-10-CM | POA: Diagnosis not present

## 2023-03-02 DIAGNOSIS — N186 End stage renal disease: Secondary | ICD-10-CM | POA: Diagnosis not present

## 2023-03-04 DIAGNOSIS — N186 End stage renal disease: Secondary | ICD-10-CM | POA: Diagnosis not present

## 2023-03-04 DIAGNOSIS — Z992 Dependence on renal dialysis: Secondary | ICD-10-CM | POA: Diagnosis not present

## 2023-03-04 DIAGNOSIS — N2581 Secondary hyperparathyroidism of renal origin: Secondary | ICD-10-CM | POA: Diagnosis not present

## 2023-03-06 DIAGNOSIS — N2581 Secondary hyperparathyroidism of renal origin: Secondary | ICD-10-CM | POA: Diagnosis not present

## 2023-03-06 DIAGNOSIS — N186 End stage renal disease: Secondary | ICD-10-CM | POA: Diagnosis not present

## 2023-03-06 DIAGNOSIS — Z992 Dependence on renal dialysis: Secondary | ICD-10-CM | POA: Diagnosis not present

## 2023-03-08 DIAGNOSIS — Z992 Dependence on renal dialysis: Secondary | ICD-10-CM | POA: Diagnosis not present

## 2023-03-08 DIAGNOSIS — N186 End stage renal disease: Secondary | ICD-10-CM | POA: Diagnosis not present

## 2023-03-08 DIAGNOSIS — N2581 Secondary hyperparathyroidism of renal origin: Secondary | ICD-10-CM | POA: Diagnosis not present

## 2023-03-11 ENCOUNTER — Inpatient Hospital Stay: Admission: RE | Admit: 2023-03-11 | Payer: Medicare Other | Source: Ambulatory Visit

## 2023-03-11 DIAGNOSIS — N2581 Secondary hyperparathyroidism of renal origin: Secondary | ICD-10-CM | POA: Diagnosis not present

## 2023-03-11 DIAGNOSIS — N186 End stage renal disease: Secondary | ICD-10-CM | POA: Diagnosis not present

## 2023-03-11 DIAGNOSIS — Z992 Dependence on renal dialysis: Secondary | ICD-10-CM | POA: Diagnosis not present

## 2023-03-13 DIAGNOSIS — Z992 Dependence on renal dialysis: Secondary | ICD-10-CM | POA: Diagnosis not present

## 2023-03-13 DIAGNOSIS — N186 End stage renal disease: Secondary | ICD-10-CM | POA: Diagnosis not present

## 2023-03-13 DIAGNOSIS — N2581 Secondary hyperparathyroidism of renal origin: Secondary | ICD-10-CM | POA: Diagnosis not present

## 2023-03-16 DIAGNOSIS — Z992 Dependence on renal dialysis: Secondary | ICD-10-CM | POA: Diagnosis not present

## 2023-03-16 DIAGNOSIS — N2581 Secondary hyperparathyroidism of renal origin: Secondary | ICD-10-CM | POA: Diagnosis not present

## 2023-03-16 DIAGNOSIS — N186 End stage renal disease: Secondary | ICD-10-CM | POA: Diagnosis not present

## 2023-03-20 DIAGNOSIS — N186 End stage renal disease: Secondary | ICD-10-CM | POA: Diagnosis not present

## 2023-03-20 DIAGNOSIS — N2581 Secondary hyperparathyroidism of renal origin: Secondary | ICD-10-CM | POA: Diagnosis not present

## 2023-03-20 DIAGNOSIS — Z992 Dependence on renal dialysis: Secondary | ICD-10-CM | POA: Diagnosis not present

## 2023-03-22 DIAGNOSIS — Q612 Polycystic kidney, adult type: Secondary | ICD-10-CM | POA: Diagnosis not present

## 2023-03-22 DIAGNOSIS — Z992 Dependence on renal dialysis: Secondary | ICD-10-CM | POA: Diagnosis not present

## 2023-03-22 DIAGNOSIS — N186 End stage renal disease: Secondary | ICD-10-CM | POA: Diagnosis not present

## 2023-03-23 DIAGNOSIS — N2581 Secondary hyperparathyroidism of renal origin: Secondary | ICD-10-CM | POA: Diagnosis not present

## 2023-03-23 DIAGNOSIS — N186 End stage renal disease: Secondary | ICD-10-CM | POA: Diagnosis not present

## 2023-03-23 DIAGNOSIS — D509 Iron deficiency anemia, unspecified: Secondary | ICD-10-CM | POA: Diagnosis not present

## 2023-03-23 DIAGNOSIS — Z992 Dependence on renal dialysis: Secondary | ICD-10-CM | POA: Diagnosis not present

## 2023-03-25 DIAGNOSIS — D509 Iron deficiency anemia, unspecified: Secondary | ICD-10-CM | POA: Diagnosis not present

## 2023-03-25 DIAGNOSIS — N186 End stage renal disease: Secondary | ICD-10-CM | POA: Diagnosis not present

## 2023-03-25 DIAGNOSIS — Z992 Dependence on renal dialysis: Secondary | ICD-10-CM | POA: Diagnosis not present

## 2023-03-25 DIAGNOSIS — N2581 Secondary hyperparathyroidism of renal origin: Secondary | ICD-10-CM | POA: Diagnosis not present

## 2023-03-27 DIAGNOSIS — D509 Iron deficiency anemia, unspecified: Secondary | ICD-10-CM | POA: Diagnosis not present

## 2023-03-27 DIAGNOSIS — Z992 Dependence on renal dialysis: Secondary | ICD-10-CM | POA: Diagnosis not present

## 2023-03-27 DIAGNOSIS — N186 End stage renal disease: Secondary | ICD-10-CM | POA: Diagnosis not present

## 2023-03-27 DIAGNOSIS — N2581 Secondary hyperparathyroidism of renal origin: Secondary | ICD-10-CM | POA: Diagnosis not present

## 2023-03-30 DIAGNOSIS — N2581 Secondary hyperparathyroidism of renal origin: Secondary | ICD-10-CM | POA: Diagnosis not present

## 2023-03-30 DIAGNOSIS — Z992 Dependence on renal dialysis: Secondary | ICD-10-CM | POA: Diagnosis not present

## 2023-03-30 DIAGNOSIS — N186 End stage renal disease: Secondary | ICD-10-CM | POA: Diagnosis not present

## 2023-03-30 DIAGNOSIS — D509 Iron deficiency anemia, unspecified: Secondary | ICD-10-CM | POA: Diagnosis not present

## 2023-04-03 DIAGNOSIS — N186 End stage renal disease: Secondary | ICD-10-CM | POA: Diagnosis not present

## 2023-04-03 DIAGNOSIS — Z992 Dependence on renal dialysis: Secondary | ICD-10-CM | POA: Diagnosis not present

## 2023-04-03 DIAGNOSIS — D509 Iron deficiency anemia, unspecified: Secondary | ICD-10-CM | POA: Diagnosis not present

## 2023-04-03 DIAGNOSIS — N2581 Secondary hyperparathyroidism of renal origin: Secondary | ICD-10-CM | POA: Diagnosis not present

## 2023-04-06 DIAGNOSIS — N2581 Secondary hyperparathyroidism of renal origin: Secondary | ICD-10-CM | POA: Diagnosis not present

## 2023-04-06 DIAGNOSIS — N186 End stage renal disease: Secondary | ICD-10-CM | POA: Diagnosis not present

## 2023-04-06 DIAGNOSIS — Z992 Dependence on renal dialysis: Secondary | ICD-10-CM | POA: Diagnosis not present

## 2023-04-06 DIAGNOSIS — D509 Iron deficiency anemia, unspecified: Secondary | ICD-10-CM | POA: Diagnosis not present

## 2023-04-08 DIAGNOSIS — N2581 Secondary hyperparathyroidism of renal origin: Secondary | ICD-10-CM | POA: Diagnosis not present

## 2023-04-08 DIAGNOSIS — D509 Iron deficiency anemia, unspecified: Secondary | ICD-10-CM | POA: Diagnosis not present

## 2023-04-08 DIAGNOSIS — Z992 Dependence on renal dialysis: Secondary | ICD-10-CM | POA: Diagnosis not present

## 2023-04-08 DIAGNOSIS — N186 End stage renal disease: Secondary | ICD-10-CM | POA: Diagnosis not present

## 2023-04-10 DIAGNOSIS — Z992 Dependence on renal dialysis: Secondary | ICD-10-CM | POA: Diagnosis not present

## 2023-04-10 DIAGNOSIS — D509 Iron deficiency anemia, unspecified: Secondary | ICD-10-CM | POA: Diagnosis not present

## 2023-04-10 DIAGNOSIS — N186 End stage renal disease: Secondary | ICD-10-CM | POA: Diagnosis not present

## 2023-04-10 DIAGNOSIS — N2581 Secondary hyperparathyroidism of renal origin: Secondary | ICD-10-CM | POA: Diagnosis not present

## 2023-04-13 DIAGNOSIS — N2581 Secondary hyperparathyroidism of renal origin: Secondary | ICD-10-CM | POA: Diagnosis not present

## 2023-04-13 DIAGNOSIS — D509 Iron deficiency anemia, unspecified: Secondary | ICD-10-CM | POA: Diagnosis not present

## 2023-04-13 DIAGNOSIS — Z992 Dependence on renal dialysis: Secondary | ICD-10-CM | POA: Diagnosis not present

## 2023-04-13 DIAGNOSIS — N186 End stage renal disease: Secondary | ICD-10-CM | POA: Diagnosis not present

## 2023-04-15 DIAGNOSIS — N2581 Secondary hyperparathyroidism of renal origin: Secondary | ICD-10-CM | POA: Diagnosis not present

## 2023-04-15 DIAGNOSIS — N186 End stage renal disease: Secondary | ICD-10-CM | POA: Diagnosis not present

## 2023-04-15 DIAGNOSIS — D509 Iron deficiency anemia, unspecified: Secondary | ICD-10-CM | POA: Diagnosis not present

## 2023-04-15 DIAGNOSIS — Z992 Dependence on renal dialysis: Secondary | ICD-10-CM | POA: Diagnosis not present

## 2023-04-17 DIAGNOSIS — Z992 Dependence on renal dialysis: Secondary | ICD-10-CM | POA: Diagnosis not present

## 2023-04-17 DIAGNOSIS — N186 End stage renal disease: Secondary | ICD-10-CM | POA: Diagnosis not present

## 2023-04-17 DIAGNOSIS — N2581 Secondary hyperparathyroidism of renal origin: Secondary | ICD-10-CM | POA: Diagnosis not present

## 2023-04-17 DIAGNOSIS — D509 Iron deficiency anemia, unspecified: Secondary | ICD-10-CM | POA: Diagnosis not present

## 2023-04-20 DIAGNOSIS — D509 Iron deficiency anemia, unspecified: Secondary | ICD-10-CM | POA: Diagnosis not present

## 2023-04-20 DIAGNOSIS — Z992 Dependence on renal dialysis: Secondary | ICD-10-CM | POA: Diagnosis not present

## 2023-04-20 DIAGNOSIS — N2581 Secondary hyperparathyroidism of renal origin: Secondary | ICD-10-CM | POA: Diagnosis not present

## 2023-04-20 DIAGNOSIS — N186 End stage renal disease: Secondary | ICD-10-CM | POA: Diagnosis not present

## 2023-04-22 DIAGNOSIS — N186 End stage renal disease: Secondary | ICD-10-CM | POA: Diagnosis not present

## 2023-04-22 DIAGNOSIS — Q612 Polycystic kidney, adult type: Secondary | ICD-10-CM | POA: Diagnosis not present

## 2023-04-22 DIAGNOSIS — Z992 Dependence on renal dialysis: Secondary | ICD-10-CM | POA: Diagnosis not present

## 2023-04-24 DIAGNOSIS — N2581 Secondary hyperparathyroidism of renal origin: Secondary | ICD-10-CM | POA: Diagnosis not present

## 2023-04-24 DIAGNOSIS — N186 End stage renal disease: Secondary | ICD-10-CM | POA: Diagnosis not present

## 2023-04-24 DIAGNOSIS — D509 Iron deficiency anemia, unspecified: Secondary | ICD-10-CM | POA: Diagnosis not present

## 2023-04-24 DIAGNOSIS — Z992 Dependence on renal dialysis: Secondary | ICD-10-CM | POA: Diagnosis not present

## 2023-04-27 DIAGNOSIS — Z992 Dependence on renal dialysis: Secondary | ICD-10-CM | POA: Diagnosis not present

## 2023-04-27 DIAGNOSIS — N2581 Secondary hyperparathyroidism of renal origin: Secondary | ICD-10-CM | POA: Diagnosis not present

## 2023-04-27 DIAGNOSIS — N186 End stage renal disease: Secondary | ICD-10-CM | POA: Diagnosis not present

## 2023-04-27 DIAGNOSIS — D509 Iron deficiency anemia, unspecified: Secondary | ICD-10-CM | POA: Diagnosis not present

## 2023-04-29 DIAGNOSIS — D509 Iron deficiency anemia, unspecified: Secondary | ICD-10-CM | POA: Diagnosis not present

## 2023-04-29 DIAGNOSIS — N186 End stage renal disease: Secondary | ICD-10-CM | POA: Diagnosis not present

## 2023-04-29 DIAGNOSIS — Z992 Dependence on renal dialysis: Secondary | ICD-10-CM | POA: Diagnosis not present

## 2023-04-29 DIAGNOSIS — N2581 Secondary hyperparathyroidism of renal origin: Secondary | ICD-10-CM | POA: Diagnosis not present

## 2023-05-01 DIAGNOSIS — Z992 Dependence on renal dialysis: Secondary | ICD-10-CM | POA: Diagnosis not present

## 2023-05-01 DIAGNOSIS — N2581 Secondary hyperparathyroidism of renal origin: Secondary | ICD-10-CM | POA: Diagnosis not present

## 2023-05-01 DIAGNOSIS — N186 End stage renal disease: Secondary | ICD-10-CM | POA: Diagnosis not present

## 2023-05-01 DIAGNOSIS — D509 Iron deficiency anemia, unspecified: Secondary | ICD-10-CM | POA: Diagnosis not present

## 2023-05-04 DIAGNOSIS — D509 Iron deficiency anemia, unspecified: Secondary | ICD-10-CM | POA: Diagnosis not present

## 2023-05-04 DIAGNOSIS — N2581 Secondary hyperparathyroidism of renal origin: Secondary | ICD-10-CM | POA: Diagnosis not present

## 2023-05-04 DIAGNOSIS — N186 End stage renal disease: Secondary | ICD-10-CM | POA: Diagnosis not present

## 2023-05-04 DIAGNOSIS — Z992 Dependence on renal dialysis: Secondary | ICD-10-CM | POA: Diagnosis not present

## 2023-05-08 DIAGNOSIS — D509 Iron deficiency anemia, unspecified: Secondary | ICD-10-CM | POA: Diagnosis not present

## 2023-05-08 DIAGNOSIS — N186 End stage renal disease: Secondary | ICD-10-CM | POA: Diagnosis not present

## 2023-05-08 DIAGNOSIS — Z992 Dependence on renal dialysis: Secondary | ICD-10-CM | POA: Diagnosis not present

## 2023-05-08 DIAGNOSIS — N2581 Secondary hyperparathyroidism of renal origin: Secondary | ICD-10-CM | POA: Diagnosis not present

## 2023-05-11 DIAGNOSIS — D509 Iron deficiency anemia, unspecified: Secondary | ICD-10-CM | POA: Diagnosis not present

## 2023-05-11 DIAGNOSIS — N2581 Secondary hyperparathyroidism of renal origin: Secondary | ICD-10-CM | POA: Diagnosis not present

## 2023-05-11 DIAGNOSIS — N186 End stage renal disease: Secondary | ICD-10-CM | POA: Diagnosis not present

## 2023-05-11 DIAGNOSIS — Z992 Dependence on renal dialysis: Secondary | ICD-10-CM | POA: Diagnosis not present

## 2023-05-13 DIAGNOSIS — D509 Iron deficiency anemia, unspecified: Secondary | ICD-10-CM | POA: Diagnosis not present

## 2023-05-13 DIAGNOSIS — Z992 Dependence on renal dialysis: Secondary | ICD-10-CM | POA: Diagnosis not present

## 2023-05-13 DIAGNOSIS — N186 End stage renal disease: Secondary | ICD-10-CM | POA: Diagnosis not present

## 2023-05-13 DIAGNOSIS — N2581 Secondary hyperparathyroidism of renal origin: Secondary | ICD-10-CM | POA: Diagnosis not present

## 2023-05-15 DIAGNOSIS — D509 Iron deficiency anemia, unspecified: Secondary | ICD-10-CM | POA: Diagnosis not present

## 2023-05-15 DIAGNOSIS — N2581 Secondary hyperparathyroidism of renal origin: Secondary | ICD-10-CM | POA: Diagnosis not present

## 2023-05-15 DIAGNOSIS — Z992 Dependence on renal dialysis: Secondary | ICD-10-CM | POA: Diagnosis not present

## 2023-05-15 DIAGNOSIS — N186 End stage renal disease: Secondary | ICD-10-CM | POA: Diagnosis not present

## 2023-05-18 DIAGNOSIS — D509 Iron deficiency anemia, unspecified: Secondary | ICD-10-CM | POA: Diagnosis not present

## 2023-05-18 DIAGNOSIS — N2581 Secondary hyperparathyroidism of renal origin: Secondary | ICD-10-CM | POA: Diagnosis not present

## 2023-05-18 DIAGNOSIS — Z992 Dependence on renal dialysis: Secondary | ICD-10-CM | POA: Diagnosis not present

## 2023-05-18 DIAGNOSIS — N186 End stage renal disease: Secondary | ICD-10-CM | POA: Diagnosis not present

## 2023-05-20 DIAGNOSIS — Z992 Dependence on renal dialysis: Secondary | ICD-10-CM | POA: Diagnosis not present

## 2023-05-20 DIAGNOSIS — D509 Iron deficiency anemia, unspecified: Secondary | ICD-10-CM | POA: Diagnosis not present

## 2023-05-20 DIAGNOSIS — N186 End stage renal disease: Secondary | ICD-10-CM | POA: Diagnosis not present

## 2023-05-20 DIAGNOSIS — N2581 Secondary hyperparathyroidism of renal origin: Secondary | ICD-10-CM | POA: Diagnosis not present

## 2023-05-22 DIAGNOSIS — N2581 Secondary hyperparathyroidism of renal origin: Secondary | ICD-10-CM | POA: Diagnosis not present

## 2023-05-22 DIAGNOSIS — N186 End stage renal disease: Secondary | ICD-10-CM | POA: Diagnosis not present

## 2023-05-22 DIAGNOSIS — D509 Iron deficiency anemia, unspecified: Secondary | ICD-10-CM | POA: Diagnosis not present

## 2023-05-22 DIAGNOSIS — Z992 Dependence on renal dialysis: Secondary | ICD-10-CM | POA: Diagnosis not present

## 2023-05-23 DIAGNOSIS — Z992 Dependence on renal dialysis: Secondary | ICD-10-CM | POA: Diagnosis not present

## 2023-05-23 DIAGNOSIS — N186 End stage renal disease: Secondary | ICD-10-CM | POA: Diagnosis not present

## 2023-05-23 DIAGNOSIS — Q612 Polycystic kidney, adult type: Secondary | ICD-10-CM | POA: Diagnosis not present

## 2023-05-25 DIAGNOSIS — N186 End stage renal disease: Secondary | ICD-10-CM | POA: Diagnosis not present

## 2023-05-25 DIAGNOSIS — Z992 Dependence on renal dialysis: Secondary | ICD-10-CM | POA: Diagnosis not present

## 2023-05-25 DIAGNOSIS — N2581 Secondary hyperparathyroidism of renal origin: Secondary | ICD-10-CM | POA: Diagnosis not present

## 2023-05-27 DIAGNOSIS — Z992 Dependence on renal dialysis: Secondary | ICD-10-CM | POA: Diagnosis not present

## 2023-05-27 DIAGNOSIS — N186 End stage renal disease: Secondary | ICD-10-CM | POA: Diagnosis not present

## 2023-05-27 DIAGNOSIS — N2581 Secondary hyperparathyroidism of renal origin: Secondary | ICD-10-CM | POA: Diagnosis not present

## 2023-05-29 DIAGNOSIS — N2581 Secondary hyperparathyroidism of renal origin: Secondary | ICD-10-CM | POA: Diagnosis not present

## 2023-05-29 DIAGNOSIS — N186 End stage renal disease: Secondary | ICD-10-CM | POA: Diagnosis not present

## 2023-05-29 DIAGNOSIS — Z992 Dependence on renal dialysis: Secondary | ICD-10-CM | POA: Diagnosis not present

## 2023-06-01 DIAGNOSIS — Z992 Dependence on renal dialysis: Secondary | ICD-10-CM | POA: Diagnosis not present

## 2023-06-01 DIAGNOSIS — N186 End stage renal disease: Secondary | ICD-10-CM | POA: Diagnosis not present

## 2023-06-01 DIAGNOSIS — N2581 Secondary hyperparathyroidism of renal origin: Secondary | ICD-10-CM | POA: Diagnosis not present

## 2023-06-05 DIAGNOSIS — N2581 Secondary hyperparathyroidism of renal origin: Secondary | ICD-10-CM | POA: Diagnosis not present

## 2023-06-05 DIAGNOSIS — N186 End stage renal disease: Secondary | ICD-10-CM | POA: Diagnosis not present

## 2023-06-05 DIAGNOSIS — Z992 Dependence on renal dialysis: Secondary | ICD-10-CM | POA: Diagnosis not present

## 2023-06-08 DIAGNOSIS — N186 End stage renal disease: Secondary | ICD-10-CM | POA: Diagnosis not present

## 2023-06-08 DIAGNOSIS — N2581 Secondary hyperparathyroidism of renal origin: Secondary | ICD-10-CM | POA: Diagnosis not present

## 2023-06-08 DIAGNOSIS — Z992 Dependence on renal dialysis: Secondary | ICD-10-CM | POA: Diagnosis not present

## 2023-06-10 DIAGNOSIS — N2581 Secondary hyperparathyroidism of renal origin: Secondary | ICD-10-CM | POA: Diagnosis not present

## 2023-06-10 DIAGNOSIS — N186 End stage renal disease: Secondary | ICD-10-CM | POA: Diagnosis not present

## 2023-06-10 DIAGNOSIS — Z992 Dependence on renal dialysis: Secondary | ICD-10-CM | POA: Diagnosis not present

## 2023-06-12 DIAGNOSIS — N186 End stage renal disease: Secondary | ICD-10-CM | POA: Diagnosis not present

## 2023-06-12 DIAGNOSIS — N2581 Secondary hyperparathyroidism of renal origin: Secondary | ICD-10-CM | POA: Diagnosis not present

## 2023-06-12 DIAGNOSIS — Z992 Dependence on renal dialysis: Secondary | ICD-10-CM | POA: Diagnosis not present

## 2023-06-15 DIAGNOSIS — N2581 Secondary hyperparathyroidism of renal origin: Secondary | ICD-10-CM | POA: Diagnosis not present

## 2023-06-15 DIAGNOSIS — N186 End stage renal disease: Secondary | ICD-10-CM | POA: Diagnosis not present

## 2023-06-15 DIAGNOSIS — Z992 Dependence on renal dialysis: Secondary | ICD-10-CM | POA: Diagnosis not present

## 2023-06-17 DIAGNOSIS — Z992 Dependence on renal dialysis: Secondary | ICD-10-CM | POA: Diagnosis not present

## 2023-06-17 DIAGNOSIS — N2581 Secondary hyperparathyroidism of renal origin: Secondary | ICD-10-CM | POA: Diagnosis not present

## 2023-06-17 DIAGNOSIS — N186 End stage renal disease: Secondary | ICD-10-CM | POA: Diagnosis not present

## 2023-06-19 DIAGNOSIS — N186 End stage renal disease: Secondary | ICD-10-CM | POA: Diagnosis not present

## 2023-06-19 DIAGNOSIS — Z992 Dependence on renal dialysis: Secondary | ICD-10-CM | POA: Diagnosis not present

## 2023-06-19 DIAGNOSIS — N2581 Secondary hyperparathyroidism of renal origin: Secondary | ICD-10-CM | POA: Diagnosis not present

## 2023-06-22 DIAGNOSIS — N2581 Secondary hyperparathyroidism of renal origin: Secondary | ICD-10-CM | POA: Diagnosis not present

## 2023-06-22 DIAGNOSIS — Z992 Dependence on renal dialysis: Secondary | ICD-10-CM | POA: Diagnosis not present

## 2023-06-22 DIAGNOSIS — N186 End stage renal disease: Secondary | ICD-10-CM | POA: Diagnosis not present

## 2023-06-22 DIAGNOSIS — Q612 Polycystic kidney, adult type: Secondary | ICD-10-CM | POA: Diagnosis not present

## 2023-06-26 DIAGNOSIS — Z992 Dependence on renal dialysis: Secondary | ICD-10-CM | POA: Diagnosis not present

## 2023-06-26 DIAGNOSIS — N186 End stage renal disease: Secondary | ICD-10-CM | POA: Diagnosis not present

## 2023-06-26 DIAGNOSIS — N2581 Secondary hyperparathyroidism of renal origin: Secondary | ICD-10-CM | POA: Diagnosis not present

## 2023-06-29 DIAGNOSIS — Z992 Dependence on renal dialysis: Secondary | ICD-10-CM | POA: Diagnosis not present

## 2023-06-29 DIAGNOSIS — N186 End stage renal disease: Secondary | ICD-10-CM | POA: Diagnosis not present

## 2023-06-29 DIAGNOSIS — N2581 Secondary hyperparathyroidism of renal origin: Secondary | ICD-10-CM | POA: Diagnosis not present

## 2023-07-01 DIAGNOSIS — N2581 Secondary hyperparathyroidism of renal origin: Secondary | ICD-10-CM | POA: Diagnosis not present

## 2023-07-01 DIAGNOSIS — N186 End stage renal disease: Secondary | ICD-10-CM | POA: Diagnosis not present

## 2023-07-01 DIAGNOSIS — Z992 Dependence on renal dialysis: Secondary | ICD-10-CM | POA: Diagnosis not present

## 2023-07-03 DIAGNOSIS — N186 End stage renal disease: Secondary | ICD-10-CM | POA: Diagnosis not present

## 2023-07-03 DIAGNOSIS — Z992 Dependence on renal dialysis: Secondary | ICD-10-CM | POA: Diagnosis not present

## 2023-07-03 DIAGNOSIS — N2581 Secondary hyperparathyroidism of renal origin: Secondary | ICD-10-CM | POA: Diagnosis not present

## 2023-07-06 DIAGNOSIS — N2581 Secondary hyperparathyroidism of renal origin: Secondary | ICD-10-CM | POA: Diagnosis not present

## 2023-07-06 DIAGNOSIS — Z992 Dependence on renal dialysis: Secondary | ICD-10-CM | POA: Diagnosis not present

## 2023-07-06 DIAGNOSIS — N186 End stage renal disease: Secondary | ICD-10-CM | POA: Diagnosis not present

## 2023-07-08 DIAGNOSIS — N186 End stage renal disease: Secondary | ICD-10-CM | POA: Diagnosis not present

## 2023-07-08 DIAGNOSIS — Z992 Dependence on renal dialysis: Secondary | ICD-10-CM | POA: Diagnosis not present

## 2023-07-08 DIAGNOSIS — N2581 Secondary hyperparathyroidism of renal origin: Secondary | ICD-10-CM | POA: Diagnosis not present

## 2023-07-10 DIAGNOSIS — N186 End stage renal disease: Secondary | ICD-10-CM | POA: Diagnosis not present

## 2023-07-10 DIAGNOSIS — Z992 Dependence on renal dialysis: Secondary | ICD-10-CM | POA: Diagnosis not present

## 2023-07-10 DIAGNOSIS — N2581 Secondary hyperparathyroidism of renal origin: Secondary | ICD-10-CM | POA: Diagnosis not present

## 2023-07-13 DIAGNOSIS — Z992 Dependence on renal dialysis: Secondary | ICD-10-CM | POA: Diagnosis not present

## 2023-07-13 DIAGNOSIS — N186 End stage renal disease: Secondary | ICD-10-CM | POA: Diagnosis not present

## 2023-07-13 DIAGNOSIS — N2581 Secondary hyperparathyroidism of renal origin: Secondary | ICD-10-CM | POA: Diagnosis not present

## 2023-07-15 DIAGNOSIS — N186 End stage renal disease: Secondary | ICD-10-CM | POA: Diagnosis not present

## 2023-07-15 DIAGNOSIS — N2581 Secondary hyperparathyroidism of renal origin: Secondary | ICD-10-CM | POA: Diagnosis not present

## 2023-07-15 DIAGNOSIS — Z992 Dependence on renal dialysis: Secondary | ICD-10-CM | POA: Diagnosis not present

## 2023-07-17 DIAGNOSIS — Z992 Dependence on renal dialysis: Secondary | ICD-10-CM | POA: Diagnosis not present

## 2023-07-17 DIAGNOSIS — N186 End stage renal disease: Secondary | ICD-10-CM | POA: Diagnosis not present

## 2023-07-17 DIAGNOSIS — N2581 Secondary hyperparathyroidism of renal origin: Secondary | ICD-10-CM | POA: Diagnosis not present

## 2023-07-20 DIAGNOSIS — Z992 Dependence on renal dialysis: Secondary | ICD-10-CM | POA: Diagnosis not present

## 2023-07-20 DIAGNOSIS — N2581 Secondary hyperparathyroidism of renal origin: Secondary | ICD-10-CM | POA: Diagnosis not present

## 2023-07-20 DIAGNOSIS — N186 End stage renal disease: Secondary | ICD-10-CM | POA: Diagnosis not present

## 2023-07-23 DIAGNOSIS — Z992 Dependence on renal dialysis: Secondary | ICD-10-CM | POA: Diagnosis not present

## 2023-07-23 DIAGNOSIS — Q612 Polycystic kidney, adult type: Secondary | ICD-10-CM | POA: Diagnosis not present

## 2023-07-23 DIAGNOSIS — N186 End stage renal disease: Secondary | ICD-10-CM | POA: Diagnosis not present

## 2023-07-24 DIAGNOSIS — D509 Iron deficiency anemia, unspecified: Secondary | ICD-10-CM | POA: Diagnosis not present

## 2023-07-24 DIAGNOSIS — N2581 Secondary hyperparathyroidism of renal origin: Secondary | ICD-10-CM | POA: Diagnosis not present

## 2023-07-24 DIAGNOSIS — Z992 Dependence on renal dialysis: Secondary | ICD-10-CM | POA: Diagnosis not present

## 2023-07-24 DIAGNOSIS — N186 End stage renal disease: Secondary | ICD-10-CM | POA: Diagnosis not present

## 2023-07-27 DIAGNOSIS — N2581 Secondary hyperparathyroidism of renal origin: Secondary | ICD-10-CM | POA: Diagnosis not present

## 2023-07-27 DIAGNOSIS — Z992 Dependence on renal dialysis: Secondary | ICD-10-CM | POA: Diagnosis not present

## 2023-07-27 DIAGNOSIS — D509 Iron deficiency anemia, unspecified: Secondary | ICD-10-CM | POA: Diagnosis not present

## 2023-07-27 DIAGNOSIS — N186 End stage renal disease: Secondary | ICD-10-CM | POA: Diagnosis not present

## 2023-07-29 DIAGNOSIS — N186 End stage renal disease: Secondary | ICD-10-CM | POA: Diagnosis not present

## 2023-07-29 DIAGNOSIS — Z992 Dependence on renal dialysis: Secondary | ICD-10-CM | POA: Diagnosis not present

## 2023-07-29 DIAGNOSIS — N2581 Secondary hyperparathyroidism of renal origin: Secondary | ICD-10-CM | POA: Diagnosis not present

## 2023-07-29 DIAGNOSIS — D509 Iron deficiency anemia, unspecified: Secondary | ICD-10-CM | POA: Diagnosis not present

## 2023-07-31 DIAGNOSIS — N2581 Secondary hyperparathyroidism of renal origin: Secondary | ICD-10-CM | POA: Diagnosis not present

## 2023-07-31 DIAGNOSIS — D509 Iron deficiency anemia, unspecified: Secondary | ICD-10-CM | POA: Diagnosis not present

## 2023-07-31 DIAGNOSIS — Z992 Dependence on renal dialysis: Secondary | ICD-10-CM | POA: Diagnosis not present

## 2023-07-31 DIAGNOSIS — N186 End stage renal disease: Secondary | ICD-10-CM | POA: Diagnosis not present

## 2023-08-03 DIAGNOSIS — N2581 Secondary hyperparathyroidism of renal origin: Secondary | ICD-10-CM | POA: Diagnosis not present

## 2023-08-03 DIAGNOSIS — D509 Iron deficiency anemia, unspecified: Secondary | ICD-10-CM | POA: Diagnosis not present

## 2023-08-03 DIAGNOSIS — N186 End stage renal disease: Secondary | ICD-10-CM | POA: Diagnosis not present

## 2023-08-03 DIAGNOSIS — Z992 Dependence on renal dialysis: Secondary | ICD-10-CM | POA: Diagnosis not present

## 2023-08-05 DIAGNOSIS — D509 Iron deficiency anemia, unspecified: Secondary | ICD-10-CM | POA: Diagnosis not present

## 2023-08-05 DIAGNOSIS — Z992 Dependence on renal dialysis: Secondary | ICD-10-CM | POA: Diagnosis not present

## 2023-08-05 DIAGNOSIS — N2581 Secondary hyperparathyroidism of renal origin: Secondary | ICD-10-CM | POA: Diagnosis not present

## 2023-08-05 DIAGNOSIS — N186 End stage renal disease: Secondary | ICD-10-CM | POA: Diagnosis not present

## 2023-08-07 DIAGNOSIS — N2581 Secondary hyperparathyroidism of renal origin: Secondary | ICD-10-CM | POA: Diagnosis not present

## 2023-08-07 DIAGNOSIS — Z992 Dependence on renal dialysis: Secondary | ICD-10-CM | POA: Diagnosis not present

## 2023-08-07 DIAGNOSIS — N186 End stage renal disease: Secondary | ICD-10-CM | POA: Diagnosis not present

## 2023-08-07 DIAGNOSIS — D509 Iron deficiency anemia, unspecified: Secondary | ICD-10-CM | POA: Diagnosis not present

## 2023-08-10 ENCOUNTER — Emergency Department (HOSPITAL_COMMUNITY)
Admission: EM | Admit: 2023-08-10 | Discharge: 2023-08-10 | Disposition: A | Discharge status: Home / Self Care | Payer: Medicare Other | Attending: Emergency Medicine | Admitting: Emergency Medicine

## 2023-08-10 ENCOUNTER — Other Ambulatory Visit: Payer: Self-pay

## 2023-08-10 ENCOUNTER — Encounter (HOSPITAL_COMMUNITY): Payer: Self-pay

## 2023-08-10 DIAGNOSIS — R58 Hemorrhage, not elsewhere classified: Secondary | ICD-10-CM | POA: Diagnosis not present

## 2023-08-10 DIAGNOSIS — Y648 Contaminated medical or biological substance administered by other means: Secondary | ICD-10-CM | POA: Diagnosis not present

## 2023-08-10 DIAGNOSIS — T82838A Hemorrhage of vascular prosthetic devices, implants and grafts, initial encounter: Secondary | ICD-10-CM | POA: Insufficient documentation

## 2023-08-10 DIAGNOSIS — I959 Hypotension, unspecified: Secondary | ICD-10-CM | POA: Diagnosis not present

## 2023-08-10 NOTE — ED Provider Notes (Signed)
EMERGENCY DEPARTMENT AT [facility] Provider Note   CSN: [id number] Arrival date & time: 08/10/23  1219     History  Chief Complaint  Patient presents with   Fistula Bleed    Sue Neal is a 77 y.o. female.  77 year old female here today for a bleeding fistula.  She had her femoral fistula accessed at dialysis today.  When she got home there is some bleeding.  EMS arrived, and placed pressure.  The time patient arrived to the emergency department, bleeding had stopped.  Per EMS report, it was not a significant mount of bleeding.        Home Medications Prior to Admission medications   Medication Sig Start Date End Date Taking? Authorizing Provider  acetaminophen (TYLENOL) 500 MG tablet Take 500-1,000 mg by mouth every 6 (six) hours as needed for moderate pain.    [provider]  cinacalcet (SENSIPAR) 60 MG tablet Take 60 mg by mouth in the morning and at bedtime.    [provider]  HYDROcodone-acetaminophen (NORCO/VICODIN) 5-325 MG tablet Take 1 Vicodin tablet at bedtime. 09/25/21   Arby Barrette, MD  multivitamin (RENA-VIT) TABS tablet Take 1 tablet by mouth 2 (two) times a week.    [provider]  NONFORMULARY OR COMPOUNDED ITEM [employer] compound: Nail Fungus - Complex Nail and Foot Disorders - Fluconazole 1%, DMSO 25%, Fluocinonide 0.05%, Ketoconazole 2%, apply 1-2 pumps to each affected nail beds and feet twice daily. 02/14/16   Vivi Barrack, DPM  Nutritional Supplements (FEEDING SUPPLEMENT, NEPRO CARB STEADY,) LIQD Take 237 mLs by mouth every Tuesday, Thursday, and Saturday at 6 PM.     [provider]  oxyCODONE-acetaminophen (PERCOCET) 5-325 MG tablet Take 1 tablet by mouth every 6 (six) hours as needed for severe pain. 12/09/20   Rhyne, Ames Coupe, PA-C  sevelamer (RENVELA) 800 MG tablet Take 2,400 mg by mouth See admin instructions. Take 2400 mg by mouth with meals and take 800 mg with snacks     [provider]      Allergies    Patient has no known allergies.    Review of Systems   Review of Systems  Physical Exam Updated Vital Signs BP (!) 96/44   Pulse 86   Temp 97.7 F (36.5 C) (Oral)   Resp 18   LMP 02/16/2012   SpO2 100%  Physical Exam Vitals reviewed.  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  No active bleeding. Neurological:     Mental Status: She is alert.     ED Results / Procedures / Treatments   Labs (all labs ordered are listed, but only abnormal results are displayed) Labs Reviewed - No data to display  EKG None  Radiology No results found.  Procedures Procedures    Medications Ordered in ED Medications - No data to display  ED Course/ Medical Decision Making/ A&P                                 Medical Decision Making 77 year old female here today for bleeding fistula.  Since resolved.  Plan- patient's fistula stopped bleeding with direct pressure.  No indication for any additional intervention.  Consider whether or not to get labs on the patient, however based on report of both the patient and the EMS paramedic, did not appear to be a significant amount of bleeding.  Patient nontachycardic.  Overall looks well.  Ambulatory without any difficulty.  Do not believe a significant amount of blood was lost.  Will discharge patient.           Final Clinical Impression(s) / ED Diagnoses Final diagnoses:  Bleeding from dialysis shunt, initial encounter [facility])    Rx / DC Orders ED Discharge Orders     None         Arletha Pili, DO 08/10/23 1357

## 2023-08-10 NOTE — Discharge Instructions (Addendum)
If your fistula begins to bleed again, apply direct pressure and call EMS.  Please follow-up with your primary care doctor at your neck scheduled appointment.  Make sure that you are going to your dialysis appointments.

## 2023-08-10 NOTE — ED Triage Notes (Signed)
Pt BIB [facility] from dialysis for bleeding at fistula site. Bleeding under control upon arrival. V/S stable.

## 2023-08-14 DIAGNOSIS — N2581 Secondary hyperparathyroidism of renal origin: Secondary | ICD-10-CM | POA: Diagnosis not present

## 2023-08-14 DIAGNOSIS — D509 Iron deficiency anemia, unspecified: Secondary | ICD-10-CM | POA: Diagnosis not present

## 2023-08-14 DIAGNOSIS — Z992 Dependence on renal dialysis: Secondary | ICD-10-CM | POA: Diagnosis not present

## 2023-08-14 DIAGNOSIS — N186 End stage renal disease: Secondary | ICD-10-CM | POA: Diagnosis not present

## 2023-08-17 DIAGNOSIS — N2581 Secondary hyperparathyroidism of renal origin: Secondary | ICD-10-CM | POA: Diagnosis not present

## 2023-08-17 DIAGNOSIS — Z992 Dependence on renal dialysis: Secondary | ICD-10-CM | POA: Diagnosis not present

## 2023-08-17 DIAGNOSIS — D509 Iron deficiency anemia, unspecified: Secondary | ICD-10-CM | POA: Diagnosis not present

## 2023-08-17 DIAGNOSIS — N186 End stage renal disease: Secondary | ICD-10-CM | POA: Diagnosis not present

## 2023-08-18 DIAGNOSIS — N186 End stage renal disease: Secondary | ICD-10-CM | POA: Diagnosis not present

## 2023-08-18 DIAGNOSIS — D509 Iron deficiency anemia, unspecified: Secondary | ICD-10-CM | POA: Diagnosis not present

## 2023-08-18 DIAGNOSIS — Z992 Dependence on renal dialysis: Secondary | ICD-10-CM | POA: Diagnosis not present

## 2023-08-18 DIAGNOSIS — N2581 Secondary hyperparathyroidism of renal origin: Secondary | ICD-10-CM | POA: Diagnosis not present

## 2023-08-21 DIAGNOSIS — N186 End stage renal disease: Secondary | ICD-10-CM | POA: Diagnosis not present

## 2023-08-21 DIAGNOSIS — Z992 Dependence on renal dialysis: Secondary | ICD-10-CM | POA: Diagnosis not present

## 2023-08-21 DIAGNOSIS — N2581 Secondary hyperparathyroidism of renal origin: Secondary | ICD-10-CM | POA: Diagnosis not present

## 2023-08-21 DIAGNOSIS — D509 Iron deficiency anemia, unspecified: Secondary | ICD-10-CM | POA: Diagnosis not present

## 2023-08-22 DIAGNOSIS — N186 End stage renal disease: Secondary | ICD-10-CM | POA: Diagnosis not present

## 2023-08-22 DIAGNOSIS — Z992 Dependence on renal dialysis: Secondary | ICD-10-CM | POA: Diagnosis not present

## 2023-08-22 DIAGNOSIS — Q612 Polycystic kidney, adult type: Secondary | ICD-10-CM | POA: Diagnosis not present

## 2023-08-24 DIAGNOSIS — Z992 Dependence on renal dialysis: Secondary | ICD-10-CM | POA: Diagnosis not present

## 2023-08-24 DIAGNOSIS — D631 Anemia in chronic kidney disease: Secondary | ICD-10-CM | POA: Diagnosis not present

## 2023-08-24 DIAGNOSIS — N186 End stage renal disease: Secondary | ICD-10-CM | POA: Diagnosis not present

## 2023-08-24 DIAGNOSIS — N2581 Secondary hyperparathyroidism of renal origin: Secondary | ICD-10-CM | POA: Diagnosis not present

## 2023-08-27 DIAGNOSIS — Z7189 Other specified counseling: Secondary | ICD-10-CM | POA: Diagnosis not present

## 2023-08-27 DIAGNOSIS — N186 End stage renal disease: Secondary | ICD-10-CM | POA: Diagnosis not present

## 2023-08-28 DIAGNOSIS — Z992 Dependence on renal dialysis: Secondary | ICD-10-CM | POA: Diagnosis not present

## 2023-08-28 DIAGNOSIS — D631 Anemia in chronic kidney disease: Secondary | ICD-10-CM | POA: Diagnosis not present

## 2023-08-28 DIAGNOSIS — N186 End stage renal disease: Secondary | ICD-10-CM | POA: Diagnosis not present

## 2023-08-28 DIAGNOSIS — N2581 Secondary hyperparathyroidism of renal origin: Secondary | ICD-10-CM | POA: Diagnosis not present

## 2023-08-31 DIAGNOSIS — D631 Anemia in chronic kidney disease: Secondary | ICD-10-CM | POA: Diagnosis not present

## 2023-08-31 DIAGNOSIS — N2581 Secondary hyperparathyroidism of renal origin: Secondary | ICD-10-CM | POA: Diagnosis not present

## 2023-08-31 DIAGNOSIS — N186 End stage renal disease: Secondary | ICD-10-CM | POA: Diagnosis not present

## 2023-08-31 DIAGNOSIS — Z992 Dependence on renal dialysis: Secondary | ICD-10-CM | POA: Diagnosis not present

## 2023-09-02 DIAGNOSIS — N2581 Secondary hyperparathyroidism of renal origin: Secondary | ICD-10-CM | POA: Diagnosis not present

## 2023-09-02 DIAGNOSIS — N186 End stage renal disease: Secondary | ICD-10-CM | POA: Diagnosis not present

## 2023-09-02 DIAGNOSIS — D631 Anemia in chronic kidney disease: Secondary | ICD-10-CM | POA: Diagnosis not present

## 2023-09-02 DIAGNOSIS — Z992 Dependence on renal dialysis: Secondary | ICD-10-CM | POA: Diagnosis not present

## 2023-09-02 NOTE — H&P (Addendum)
Chief Complaint: Prolonged bleeding  Interval H&P  The patient has presented today for an angiogram/ angioplasty.  Various methods of treatment have been discussed with the patient.  After consideration of risk, benefits and other options for treatment, the patient has consented to a angiogram/ angioplasty with  possible stent placement.   Risks of angiogram with potential angioplasty and stenting if needed.contrast reaction, extravasation/ bleeding, dissection, hypotension and death were explained to the patient.  The patient's history has been reviewed and the patient has been examined, no changes in status.  Stable for angiogram/angioplasty  I have reviewed the patient's chart and labs.  Questions were answered to the patient's satisfaction.  Dialysis Orders:  [facility]  3h  450/1.5  83.5kg  2/2 bath  AVG   Hep 5000 - Hectoral IV q HD - No ESA, Hgb 12.2 on 8/19   Assessment/Plan: [facility]D [facility]yzing at [facility] regimen with last dialysis Tuesday Prolonged bleeding - planning on angiogram with possibly angioplasty.  She last had a thrombectomy on 12/22/2022 has had visits to the ER for prolonged bleeding. Renal osteodystrophy - continue binders per home regimen. Anemia - managed with ESA's and IV iron at dialysis center. HTN - resume home regimen.   HPI: Azaila Hasberry is an 77 y.o. female with a history of hypertension, chronic back pain, hypertension, ESRD dialyzing at [facility] Tuesday Thursdays and Saturdays.  Patient's last dialysis was on Saturday.  Patient has had an issue with prolonged bleeding actually requiring a ER visit with bleeding controlled with manual pressure.  Patient is being referred for prolonged bleeding .  ROS Per HPI.  Chemistry and CBC: Creatinine, Ser  Date/Time Value Ref Range Status  12/09/2020 10:21 AM 7.50 (H) 0.44 - 1.00 mg/dL Final  45/40/9811 91:47 AM 7.00 (H) 0.44 - 1.00 mg/dL Final  82/95/6213 08:65 AM 9.09 (H) 0.44 - 1.00 mg/dL Final   78/46/9629 52:84 PM 8.55 (H) 0.44 - 1.00 mg/dL Final  13/24/4010 27:25 AM 7.94 (H) 0.44 - 1.00 mg/dL Final  36/64/4034 74:25 AM 7.80 (H) 0.44 - 1.00 mg/dL Final  95/63/8756 43:32 AM 8.36 (H) 0.44 - 1.00 mg/dL Final  95/18/8416 60:63 AM 8.80 (H) 0.44 - 1.00 mg/dL Final  01/60/1093 23:55 PM 7.26 (H) 0.44 - 1.00 mg/dL Final  73/22/0254 27:06 AM 5.70 (H) 0.44 - 1.00 mg/dL Final  23/76/2831 51:76 AM 6.10 (H) 0.50 - 1.10 mg/dL Final  16/03/3709 62:69 AM 7.10 (H) 0.50 - 1.10 mg/dL Final  48/54/6270 35:00 AM 6.00 (H) 0.50 - 1.10 mg/dL Final   No results for input(s): "NA", "K", "CL", "CO2", "GLUCOSE", "BUN", "CREATININE", "CALCIUM", "PHOS" in the last 168 hours.  Invalid input(s): "ALB" No results for input(s): "WBC", "NEUTROABS", "HGB", "HCT", "MCV", "PLT" in the last 168 hours. Liver Function Tests: No results for input(s): "AST", "ALT", "ALKPHOS", "BILITOT", "PROT", "ALBUMIN" in the last 168 hours. No results for input(s): "LIPASE", "AMYLASE" in the last 168 hours. No results for input(s): "AMMONIA" in the last 168 hours. Cardiac Enzymes: No results for input(s): "CKTOTAL", "CKMB", "CKMBINDEX", "TROPONINI" in the last 168 hours. Iron Studies: No results for input(s): "IRON", "TIBC", "TRANSFERRIN", "FERRITIN" in the last 72 hours. PT/INR: @LABRCNTIP (inr:5)  Xrays/Other Studies: )No results found for this or any previous visit (from the past 48 hours). No results found.  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)     lt thigh-working-old rt and lt upper arm shunts   Uterine fibroid     PSH:   Past Surgical History:  Procedure Laterality Date   ARTERIOVENOUS GRAFT PLACEMENT Right 03/19/2000   upper arm   ARTERIOVENOUS GRAFT PLACEMENT Left 12/03/2000   thigh   AV FISTULA PLACEMENT Left 05/11/2020   Procedure: Repair of ulceration left thigh arteriovenous graft;  Surgeon: Chuck Hint, MD;  Location: [facility] OR;  Service: Vascular;  Laterality: Left;   AV FISTULA PLACEMENT Right 06/11/2020   Procedure: INSERTION OF ARTERIOVENOUS (AV) GORE-TEX GRAFT  RIGHT THIGH;  Surgeon: Chuck Hint, MD;  Location: [facility] OR;  Service: Vascular;  Laterality: Right;   AVGG REMOVAL Left 12/09/2020   Procedure: LEFT THIGH ARTERIOVENOUS GORETEX GRAFT (AVGG) REMOVAL;  Surgeon: Cephus Shelling, MD;  Location: [facility] OR;  Service: Vascular;  Laterality: Left;   BIOPSY  09/28/2019   Procedure: BIOPSY;  Surgeon: Rachael Fee, MD;  Location: [facility];  Service: Endoscopy;;   CARPAL TUNNEL RELEASE Left 03/10/2013   Procedure: CARPAL TUNNEL RELEASE;  Surgeon: Nicki Reaper, MD;  Location: [facility];  Service: Orthopedics;  Laterality: Left;   CARPAL TUNNEL RELEASE Right 03/28/2015   Procedure: RIGHT CARPAL TUNNEL RELEASE;  Surgeon: Cindee Salt, MD;  Location: [facility];  Service: Orthopedics;  Laterality: Right;  ANESTHESIA:  IV REGIONAL FAB   COLONOSCOPY W/ BIOPSIES     COLONOSCOPY WITH PROPOFOL N/A 05/14/2016   Procedure: COLONOSCOPY WITH PROPOFOL;  Surgeon: Rachael Fee, MD;  Location: [facility];  Service: Endoscopy;  Laterality: N/A;   COLONOSCOPY WITH PROPOFOL N/A 09/28/2019   Procedure: COLONOSCOPY WITH PROPOFOL;  Surgeon: Rachael Fee, MD;  Location: [facility];  Service: Endoscopy;  Laterality: N/A;  HARD STICK-DIALYSIS PATIENT   HEMICOLECTOMY  02/1996   HERNIA REPAIR     laparoscopic repair during a [facility] hospitalization from 03/26/2006-03/30/2006   INCISIONAL HERNIA  REPAIR  04/15/2006   laparoscopic   INSERTION OF DIALYSIS CATHETER Left 06/06/2000   IJ Quinton catheter   INSERTION OF DIALYSIS CATHETER Right 06/28/2000   IJ Ash catheter   INSERTION OF DIALYSIS CATHETER Left 08/13/2000   subclavian Ash catheter   INSERTION OF DIALYSIS CATHETER Left 05/11/2020   Procedure: Ultrasound-guided access to the right internal jugular vein with venogram right IJ and azygous vein 2.  Placement of left femoral 55 cm tunneled dialysis catheter under ultrasound guidance ;  Surgeon: Chuck Hint, MD;  Location: [facility] OR;  Service: Vascular;  Laterality: Left;   multiple failed grafts     left thigh AVG 12/03/00, clotted -05/31/03, 01/24/04, 08/28/04, 09/06/04( thrombectomy and revision )Left AVG declot procedure including complete AV shuntogram, 08/29/04 left AV thrombolysis and angioplasty 2012 shunto gram to left thigh AVG   POLYPECTOMY  09/28/2019   Procedure: POLYPECTOMY;  Surgeon: Rachael Fee, MD;  Location: [facility];  Service: Endoscopy;;   REMOVAL OF A DIALYSIS CATHETER  05/31/2000   Schon catheter   REVISION OF ARTERIOVENOUS GORETEX GRAFT Left 06/23/2012   thigh;  with exc. pseudoaneurysm   REVISION OF ARTERIOVENOUS GORETEX GRAFT Left 02/07/2016   Procedure: REVISION OF LEFT THIGH ARTERIOVENOUS GORETEX GRAFT;  Surgeon: Chuck Hint, MD;  Location: [facility] OR;  Service: Vascular;  Laterality: Left;   SHUNTOGRAM Left 02/16/2012   thigh; with angioplasty, venous anastomosis; stent, medial graft pseudoaneurysm   SHUNTOGRAM N/A 02/16/2012   Procedure: Betsey Amen;  Surgeon: Nada Libman, MD;  Location: [facility] CATH LAB;  Service: Cardiovascular;  Laterality: N/A;   THROMBECTOMY / ARTERIOVENOUS GRAFT REVISION Left 02/07/2016   thigh   THROMBECTOMY AND REVISION OF ARTERIOVENTOUS (AV) GORETEX  GRAFT Right 06/04/2000   upper arm   THROMBECTOMY AND REVISION OF ARTERIOVENTOUS (AV) GORETEX  GRAFT Left 09/06/2004   thigh   TRIGGER FINGER RELEASE Right 03/28/2015    Procedure: RELEASE A-1 PULLEY RIGHT THUMB;  Surgeon: Cindee Salt, MD;  Location: [facility];  Service: Orthopedics;  Laterality: Right;    Allergies: No Known Allergies  Medications:   Prior to Admission medications   Medication Sig Start Date End Date Taking? Authorizing Provider  acetaminophen (TYLENOL) 500 MG tablet Take 500-1,000 mg by mouth every 6 (six) hours as needed for moderate pain.    [provider]  cinacalcet (SENSIPAR) 60 MG tablet Take 60 mg by mouth in the morning and at bedtime.    [provider]  HYDROcodone-acetaminophen (NORCO/VICODIN) 5-325 MG tablet Take 1 Vicodin tablet at bedtime. 09/25/21   Arby Barrette, MD  multivitamin (RENA-VIT) TABS tablet Take 1 tablet by mouth 2 (two) times a week.    [provider]  NONFORMULARY OR COMPOUNDED ITEM [employer] compound: Nail Fungus - Complex Nail and Foot Disorders - Fluconazole 1%, DMSO 25%, Fluocinonide 0.05%, Ketoconazole 2%, apply 1-2 pumps to each affected nail beds and feet twice daily. 02/14/16   Vivi Barrack, DPM  Nutritional Supplements (FEEDING SUPPLEMENT, NEPRO CARB STEADY,) LIQD Take 237 mLs by mouth every Tuesday, Thursday, and Saturday at 6 PM.     [provider]  oxyCODONE-acetaminophen (PERCOCET) 5-325 MG tablet Take 1 tablet by mouth every 6 (six) hours as needed for severe pain. 12/09/20   Rhyne, Ames Coupe, PA-C  sevelamer (RENVELA) 800 MG tablet Take 2,400 mg by mouth See admin instructions. Take 2400 mg by mouth with meals and take 800 mg with snacks    [provider]    Discontinued Meds:  There are no discontinued medications.  Social History:  reports that she quit smoking about 45 years ago. Her smoking use included cigarettes. She has never used smokeless tobacco. She reports current alcohol use. She reports that she does not use drugs.  Family History:   Family History  Problem Relation Age of Onset   Colon cancer Mother 24    Diabetes Cousin    Breast cancer Maternal Aunt    Breast cancer Cousin     Last menstrual period 02/16/2012. Gen:nad, comfortable appearing, sitting up in chair CVS:reg rate Resp:cta bl, no w/r/r/c, unlabored, bl chest expansion YNW:GNFAO, soft, nt/nd ZHY:QMVHQION left thigh, dressings in place, left thigh tdc in place, mild pretib edema bilat Neuro: speech clear and coherent, moves all extremities spontaneously ACCESS: Right femoral graft,  pulsatile to the outflow       Javon Snee, Len Blalock, MD 09/02/2023, 2:57 PM

## 2023-09-03 ENCOUNTER — Encounter (HOSPITAL_COMMUNITY): Payer: Self-pay | Admitting: Nephrology

## 2023-09-03 ENCOUNTER — Encounter (HOSPITAL_COMMUNITY)
Admission: RE | Disposition: A | Discharge status: Home / Self Care | Payer: Self-pay | Source: Home / Self Care | Attending: Nephrology

## 2023-09-03 ENCOUNTER — Ambulatory Visit (HOSPITAL_COMMUNITY)
Admission: RE | Admit: 2023-09-03 | Discharge: 2023-09-03 | Disposition: A | Discharge status: Home / Self Care | Payer: Medicare Other | Attending: Nephrology | Admitting: Nephrology

## 2023-09-03 ENCOUNTER — Other Ambulatory Visit: Payer: Self-pay

## 2023-09-03 DIAGNOSIS — I871 Compression of vein: Secondary | ICD-10-CM | POA: Diagnosis not present

## 2023-09-03 DIAGNOSIS — D631 Anemia in chronic kidney disease: Secondary | ICD-10-CM | POA: Diagnosis not present

## 2023-09-03 DIAGNOSIS — N250 Renal osteodystrophy: Secondary | ICD-10-CM | POA: Insufficient documentation

## 2023-09-03 DIAGNOSIS — T82898A Other specified complication of vascular prosthetic devices, implants and grafts, initial encounter: Secondary | ICD-10-CM | POA: Insufficient documentation

## 2023-09-03 DIAGNOSIS — T82858A Stenosis of vascular prosthetic devices, implants and grafts, initial encounter: Secondary | ICD-10-CM | POA: Diagnosis not present

## 2023-09-03 DIAGNOSIS — Z79899 Other long term (current) drug therapy: Secondary | ICD-10-CM | POA: Diagnosis not present

## 2023-09-03 DIAGNOSIS — G8929 Other chronic pain: Secondary | ICD-10-CM | POA: Insufficient documentation

## 2023-09-03 DIAGNOSIS — I120 Hypertensive chronic kidney disease with stage 5 chronic kidney disease or end stage renal disease: Secondary | ICD-10-CM | POA: Diagnosis not present

## 2023-09-03 DIAGNOSIS — Z992 Dependence on renal dialysis: Secondary | ICD-10-CM | POA: Diagnosis not present

## 2023-09-03 DIAGNOSIS — Z87891 Personal history of nicotine dependence: Secondary | ICD-10-CM | POA: Insufficient documentation

## 2023-09-03 DIAGNOSIS — N186 End stage renal disease: Secondary | ICD-10-CM | POA: Insufficient documentation

## 2023-09-03 DIAGNOSIS — Y832 Surgical operation with anastomosis, bypass or graft as the cause of abnormal reaction of the patient, or of later complication, without mention of misadventure at the time of the procedure: Secondary | ICD-10-CM | POA: Diagnosis not present

## 2023-09-03 HISTORY — PX: PERIPHERAL VASCULAR BALLOON ANGIOPLASTY: CATH118281

## 2023-09-03 HISTORY — PX: A/V FISTULAGRAM: CATH118298

## 2023-09-03 LAB — POCT I-STAT, CHEM 8
BUN: 33 mg/dL — ABNORMAL HIGH (ref 8–23)
Calcium, Ion: 1.07 mmol/L — ABNORMAL LOW (ref 1.15–1.40)
Chloride: 99 mmol/L (ref 98–111)
Creatinine, Ser: 9.2 mg/dL — ABNORMAL HIGH (ref 0.44–1.00)
Glucose, Bld: 86 mg/dL (ref 70–99)
HCT: 36 % (ref 36.0–46.0)
Hemoglobin: 12.2 g/dL (ref 12.0–15.0)
Potassium: 4.8 mmol/L (ref 3.5–5.1)
Sodium: 140 mmol/L (ref 135–145)
TCO2: 29 mmol/L (ref 22–32)

## 2023-09-03 SURGERY — A/V FISTULAGRAM
Anesthesia: LOCAL

## 2023-09-03 MED ORDER — IODIXANOL 320 MG/ML IV SOLN
INTRAVENOUS | Status: DC | PRN
  Administered 2023-09-03: 10 mL

## 2023-09-03 MED ORDER — MIDAZOLAM HCL 2 MG/2ML IJ SOLN
INTRAMUSCULAR | Status: DC | PRN
  Administered 2023-09-03: .5 mg via INTRAVENOUS

## 2023-09-03 MED ORDER — LIDOCAINE HCL (PF) 1 % IJ SOLN
INTRAMUSCULAR | Status: DC | PRN
  Administered 2023-09-03: 12 mL via INTRADERMAL

## 2023-09-03 MED ORDER — FENTANYL CITRATE (PF) 100 MCG/2ML IJ SOLN
INTRAMUSCULAR | Status: DC | PRN
  Administered 2023-09-03: 25 ug via INTRAVENOUS

## 2023-09-03 MED ORDER — FENTANYL CITRATE (PF) 100 MCG/2ML IJ SOLN
INTRAMUSCULAR | Status: AC
  Filled 2023-09-03: qty 2

## 2023-09-03 MED ORDER — HEPARIN (PORCINE) IN NACL 1000-0.9 UT/500ML-% IV SOLN
INTRAVENOUS | Status: DC | PRN
  Administered 2023-09-03: 500 mL

## 2023-09-03 MED ORDER — MIDAZOLAM HCL 2 MG/2ML IJ SOLN
INTRAMUSCULAR | Status: AC
  Filled 2023-09-03: qty 2

## 2023-09-03 MED ORDER — LIDOCAINE HCL (PF) 1 % IJ SOLN
INTRAMUSCULAR | Status: AC
  Filled 2023-09-03: qty 30

## 2023-09-03 SURGICAL SUPPLY — 14 items
BAG SNAP BAND KOVER 36X36 (MISCELLANEOUS) ×3 IMPLANT
BALLN MUSTANG 10.0X40 75 (BALLOONS) ×2
BALLN MUSTANG 8X80X75 (BALLOONS) ×2
BALLOON MUSTANG 10.0X40 75 (BALLOONS) IMPLANT
BALLOON MUSTANG 8X80X75 (BALLOONS) IMPLANT
COVER DOME SNAP 22 D (MISCELLANEOUS) ×3 IMPLANT
GUIDEWIRE ANGLED .035X150CM (WIRE) IMPLANT
PROTECTION STATION PRESSURIZED (MISCELLANEOUS) ×2
SHEATH PINNACLE R/O II 7F 4CM (SHEATH) IMPLANT
SHEATH PROBE COVER 6X72 (BAG) ×3 IMPLANT
STATION PROTECTION PRESSURIZED (MISCELLANEOUS) ×3 IMPLANT
SYR MEDALLION 10ML (SYRINGE) IMPLANT
TRAY PV CATH (CUSTOM PROCEDURE TRAY) ×3 IMPLANT
WIRE STARTER BENTSON 035X150 (WIRE) IMPLANT

## 2023-09-03 NOTE — Progress Notes (Addendum)
Pt with fistula to right lower extremity (thigh) noted to be pinkened, swollen, and warm to touch, no drainage noted, pt denies pain, safety maintained

## 2023-09-03 NOTE — Discharge Instructions (Signed)

## 2023-09-03 NOTE — Op Note (Signed)
Patient presents with rising venous pressures noted on dialysis and long bleeding. On exam the right thigh loop AVG (arterial limb lateral) has decreased thrill and a high pitched bruit with hyperpulsatility.   Summary:  1) Successful angiogram of a right femoral loop AVG  with evidence of 50% venous anastomosis and also venous limb focal 70% intragraft stenosis which were corrected with angioplasty (8 mm Mustang 100% effaced ~22 atm). 2) Rest of graft, centrals and inflow are widely patent but there was a 80% common femoral vein stenosis extending from just above the venous anastomosis treated successfully with a 8 mm and then 10 mm Mustang balloon fully effaced at approximately 18 atm of pressure.  3) Patent arterial anastomosis and iliac vein.   Description of procedure: The right thigh/leg was prepped and draped in the usual fashion. The right thigh loop AVG  was first cannulated with a 18ga  needle( [id number]) in the arterial limb  in an antegrade direction and then a 7 Fr sheath was inserted by guidewire exchange technique.  Angiogram revealed a patent arterial limb of the graft with a local 50 to 70% venous limb intragraft stenosis, 50% venous anastomosis stenosis, 70 to 80% common femoral vein stenosis extending from just beyond the venous anastomosis, patent iliac vein.    On reflux arteriogram, the arterial anastomosis and inflow graft segment were widely patent.  I then advanced a wire through the sheath and parked it in the central veins. I then advanced an 8 x 4 Mustang angioplasty balloon through the antegrade sheath over the guidewire to the level of the common femoral vein, venous anastomosis, focal venous limb intragraft stenoses.  Venous  angioplasty was performed to 100% balloon effacement at approximately 15-22 atm of pressure via a hand syringe assembly all 3 levels.  There was less than 10% residual stenosis with no evidence of extravasation at the venous anastomosis and venous limb  intragraft sites.  Unfortunately there was at least 50% residual stenosis in the common femoral vein lesion; we then advanced a 10 mm Mustang over the guidewire to the level of the common femoral vein stenosis and fourth balloon effacement was achieved at approximately 18 atm of pressure.  No angiogram showed less than 30% residual stenosis in the common femoral vein site with no evidence of extravasation or dissection.  Hemostasis: A 3-0 ethilon purse string suture was placed at the cannulation site on removal of the sheath.  Sedation: 0.5 mg Versed, Fentanyl.  Contrast. 10 mL  Monitoring: Because of the patient's comorbid conditions and sedation during the procedure, continuous EKG monitoring and O2 saturation monitoring was performed throughout the procedure by the RN. There were no abnormal arrhythmias encountered.  Complications: None.   Diagnoses: I87.1 Stricture of vein  N18.6 ESRD T82.858A Stricture of access  Procedure Coding:  [phone] Cannulation and angiogram of fistula, venous angioplasty (AVG VA) U9811 Contrast  Recommendations:  1. Continue to cannulate the fistula with 15G needles.  2. Refer back for problems with flows. 3. Remove the suture next treatment.   Discharge: The patient was discharged home in stable condition. The patient was given education regarding the care of the dialysis access AVF and specific instructions in case of any problems.

## 2023-09-07 DIAGNOSIS — N2581 Secondary hyperparathyroidism of renal origin: Secondary | ICD-10-CM | POA: Diagnosis not present

## 2023-09-07 DIAGNOSIS — Z992 Dependence on renal dialysis: Secondary | ICD-10-CM | POA: Diagnosis not present

## 2023-09-07 DIAGNOSIS — D631 Anemia in chronic kidney disease: Secondary | ICD-10-CM | POA: Diagnosis not present

## 2023-09-07 DIAGNOSIS — N186 End stage renal disease: Secondary | ICD-10-CM | POA: Diagnosis not present

## 2023-09-09 DIAGNOSIS — N186 End stage renal disease: Secondary | ICD-10-CM | POA: Diagnosis not present

## 2023-09-09 DIAGNOSIS — D631 Anemia in chronic kidney disease: Secondary | ICD-10-CM | POA: Diagnosis not present

## 2023-09-09 DIAGNOSIS — N2581 Secondary hyperparathyroidism of renal origin: Secondary | ICD-10-CM | POA: Diagnosis not present

## 2023-09-09 DIAGNOSIS — Z992 Dependence on renal dialysis: Secondary | ICD-10-CM | POA: Diagnosis not present

## 2023-09-11 DIAGNOSIS — Z992 Dependence on renal dialysis: Secondary | ICD-10-CM | POA: Diagnosis not present

## 2023-09-11 DIAGNOSIS — D631 Anemia in chronic kidney disease: Secondary | ICD-10-CM | POA: Diagnosis not present

## 2023-09-11 DIAGNOSIS — N186 End stage renal disease: Secondary | ICD-10-CM | POA: Diagnosis not present

## 2023-09-11 DIAGNOSIS — N2581 Secondary hyperparathyroidism of renal origin: Secondary | ICD-10-CM | POA: Diagnosis not present

## 2023-09-13 DIAGNOSIS — N2581 Secondary hyperparathyroidism of renal origin: Secondary | ICD-10-CM | POA: Diagnosis not present

## 2023-09-13 DIAGNOSIS — N186 End stage renal disease: Secondary | ICD-10-CM | POA: Diagnosis not present

## 2023-09-13 DIAGNOSIS — Z992 Dependence on renal dialysis: Secondary | ICD-10-CM | POA: Diagnosis not present

## 2023-09-13 DIAGNOSIS — D631 Anemia in chronic kidney disease: Secondary | ICD-10-CM | POA: Diagnosis not present

## 2023-09-16 DIAGNOSIS — N2581 Secondary hyperparathyroidism of renal origin: Secondary | ICD-10-CM | POA: Diagnosis not present

## 2023-09-16 DIAGNOSIS — N186 End stage renal disease: Secondary | ICD-10-CM | POA: Diagnosis not present

## 2023-09-16 DIAGNOSIS — D631 Anemia in chronic kidney disease: Secondary | ICD-10-CM | POA: Diagnosis not present

## 2023-09-16 DIAGNOSIS — Z992 Dependence on renal dialysis: Secondary | ICD-10-CM | POA: Diagnosis not present

## 2023-09-18 DIAGNOSIS — D631 Anemia in chronic kidney disease: Secondary | ICD-10-CM | POA: Diagnosis not present

## 2023-09-18 DIAGNOSIS — N2581 Secondary hyperparathyroidism of renal origin: Secondary | ICD-10-CM | POA: Diagnosis not present

## 2023-09-18 DIAGNOSIS — N186 End stage renal disease: Secondary | ICD-10-CM | POA: Diagnosis not present

## 2023-09-18 DIAGNOSIS — Z992 Dependence on renal dialysis: Secondary | ICD-10-CM | POA: Diagnosis not present

## 2023-09-20 DIAGNOSIS — D631 Anemia in chronic kidney disease: Secondary | ICD-10-CM | POA: Diagnosis not present

## 2023-09-20 DIAGNOSIS — Z992 Dependence on renal dialysis: Secondary | ICD-10-CM | POA: Diagnosis not present

## 2023-09-20 DIAGNOSIS — N186 End stage renal disease: Secondary | ICD-10-CM | POA: Diagnosis not present

## 2023-09-20 DIAGNOSIS — N2581 Secondary hyperparathyroidism of renal origin: Secondary | ICD-10-CM | POA: Diagnosis not present

## 2023-09-24 ENCOUNTER — Inpatient Hospital Stay: Admission: RE | Admit: 2023-09-24 | Payer: Medicare Other | Source: Ambulatory Visit

## 2024-01-26 ENCOUNTER — Other Ambulatory Visit: Payer: Self-pay

## 2024-01-26 ENCOUNTER — Encounter (HOSPITAL_COMMUNITY): Payer: Self-pay

## 2024-01-26 ENCOUNTER — Emergency Department (HOSPITAL_COMMUNITY)
Admission: EM | Admit: 2024-01-26 | Discharge: 2024-01-26 | Disposition: A | Discharge status: Home / Self Care | Attending: Emergency Medicine | Admitting: Emergency Medicine

## 2024-01-26 DIAGNOSIS — Z992 Dependence on renal dialysis: Secondary | ICD-10-CM | POA: Insufficient documentation

## 2024-01-26 DIAGNOSIS — T82868A Thrombosis of vascular prosthetic devices, implants and grafts, initial encounter: Secondary | ICD-10-CM | POA: Insufficient documentation

## 2024-01-26 DIAGNOSIS — I120 Hypertensive chronic kidney disease with stage 5 chronic kidney disease or end stage renal disease: Secondary | ICD-10-CM | POA: Diagnosis not present

## 2024-01-26 DIAGNOSIS — N186 End stage renal disease: Secondary | ICD-10-CM | POA: Diagnosis present

## 2024-01-26 LAB — CBC WITH DIFFERENTIAL/PLATELET
Abs Immature Granulocytes: 0 10*3/uL (ref 0.00–0.07)
Basophils Absolute: 0 10*3/uL (ref 0.0–0.1)
Basophils Relative: 1 %
Eosinophils Absolute: 0.1 10*3/uL (ref 0.0–0.5)
Eosinophils Relative: 2 %
HCT: 34.5 % — ABNORMAL LOW (ref 36.0–46.0)
Hemoglobin: 10.6 g/dL — ABNORMAL LOW (ref 12.0–15.0)
Immature Granulocytes: 0 %
Lymphocytes Relative: 29 %
Lymphs Abs: 1.4 10*3/uL (ref 0.7–4.0)
MCH: 28.6 pg (ref 26.0–34.0)
MCHC: 30.7 g/dL (ref 30.0–36.0)
MCV: 93 fL (ref 80.0–100.0)
Monocytes Absolute: 0.3 10*3/uL (ref 0.1–1.0)
Monocytes Relative: 7 %
Neutro Abs: 2.9 10*3/uL (ref 1.7–7.7)
Neutrophils Relative %: 61 %
Platelets: 101 10*3/uL — ABNORMAL LOW (ref 150–400)
RBC: 3.71 MIL/uL — ABNORMAL LOW (ref 3.87–5.11)
RDW: 17 % — ABNORMAL HIGH (ref 11.5–15.5)
WBC: 4.7 10*3/uL (ref 4.0–10.5)
nRBC: 0 % (ref 0.0–0.2)

## 2024-01-26 LAB — BASIC METABOLIC PANEL WITH GFR
Anion gap: 13 (ref 5–15)
BUN: 43 mg/dL — ABNORMAL HIGH (ref 8–23)
CO2: 25 mmol/L (ref 22–32)
Calcium: 9.4 mg/dL (ref 8.9–10.3)
Chloride: 102 mmol/L (ref 98–111)
Creatinine, Ser: 10.5 mg/dL — ABNORMAL HIGH (ref 0.44–1.00)
GFR, Estimated: 3 mL/min — ABNORMAL LOW (ref >60)
Glucose, Bld: 70 mg/dL (ref 70–99)
Potassium: 4.4 mmol/L (ref 3.5–5.1)
Sodium: 140 mmol/L (ref 135–145)

## 2024-01-26 NOTE — ED Provider Notes (Signed)
 [place] EMERGENCY DEPARTMENT AT [facility] Provider Note   CSN: [id number] Arrival date & time: 01/26/24  1105     History  Chief Complaint  Patient presents with   Vascular Access Problem    Sue Neal is a 78 y.o. female.  Pt is a 78 yo female with pmhx significant for ESRD on HD (Tu, Th, Sat) with multiple failed fistulas and shunt revisions, chronic anemia, chronic back pain, htn, and hyperparathyroidism.  Pt said she went to dialysis yesterday and her shunt had clotted and she was unable to get dialysis.  She had been getting dialysis through her right upper thigh.  She said they tried to get her into see the vascular doctors, but they did not have an appointment, so she was sent here.  Pt denies sob.  Last dialysis on 5/3.       Home Medications Prior to Admission medications   Medication Sig Start Date End Date Taking? Authorizing Provider  acetaminophen  (TYLENOL ) 500 MG tablet Take 500-1,000 mg by mouth every 6 (six) hours as needed for moderate pain.    [provider]  cinacalcet  (SENSIPAR ) 90 MG tablet Take 90 mg by mouth daily.    [provider]  ketotifen  (ZADITOR ) 0.035 % ophthalmic solution Place 1 drop into both eyes 2 (two) times daily as needed (allergies).    [provider]  sevelamer  (RENVELA ) 800 MG tablet Take 800-2,400 mg by mouth See admin instructions. Take 2400 mg by mouth with meals and take 800 mg with snacks    [provider]      Allergies    Patient has no known allergies.    Review of Systems   Review of Systems  All other systems reviewed and are negative.   Physical Exam Updated Vital Signs BP 91/66   Pulse 64   Temp 98.3 F (36.8 C) (Oral)   Resp 18   Ht 5\' 1"  (1.549 m)   Wt 74.4 kg   LMP 02/16/2012   SpO2 100%   BMI 30.99 kg/m  Physical Exam Vitals and nursing note reviewed.  Constitutional:      Appearance: Normal appearance.  HENT:     Head: Normocephalic and  atraumatic.     Right Ear: External ear normal.     Left Ear: External ear normal.     Nose: Nose normal.     Mouth/Throat:     Mouth: Mucous membranes are moist.     Pharynx: Oropharynx is clear.  Eyes:     Extraocular Movements: Extraocular movements intact.     Conjunctiva/sclera: Conjunctivae normal.     Pupils: Pupils are equal, round, and reactive to light.  Cardiovascular:     Rate and Rhythm: Normal rate and regular rhythm.     Pulses: Normal pulses.     Heart sounds: Normal heart sounds.  Pulmonary:     Effort: Pulmonary effort is normal.     Breath sounds: Normal breath sounds.  Abdominal:     General: Abdomen is flat. Bowel sounds are normal.     Palpations: Abdomen is soft.  Musculoskeletal:        General: Normal range of motion.     Cervical back: Normal range of motion and neck supple.     Comments: Multiple non-working AVFs.  Right upper thigh AV shunt with no thrill.  Skin:    General: Skin is warm.     Capillary Refill: Capillary refill takes less than 2 seconds.  Neurological:     General: No focal deficit present.     Mental Status: She is alert and oriented to person, place, and time.  Psychiatric:        Mood and Affect: Mood normal.        Behavior: Behavior normal.     ED Results / Procedures / Treatments   Labs (all labs ordered are listed, but only abnormal results are displayed) Labs Reviewed  BASIC METABOLIC PANEL WITH GFR - Abnormal; Notable for the following components:      Result Value   BUN 43 (*)    Creatinine, Ser 10.50 (*)    GFR, Estimated 3 (*)    All other components within normal limits  CBC WITH DIFFERENTIAL/PLATELET - Abnormal; Notable for the following components:   RBC 3.71 (*)    Hemoglobin 10.6 (*)    HCT 34.5 (*)    RDW 17.0 (*)    Platelets 101 (*)    All other components within normal limits    EKG None  Radiology No results found.  Procedures Procedures    Medications Ordered in ED Medications - No  data to display  ED Course/ Medical Decision Making/ A&P                                 Medical Decision Making Amount and/or Complexity of Data Reviewed Labs: ordered.   This patient presents to the ED for concern of shunt not working, this involves an extensive number of treatment options, and is a complaint that carries with it a high risk of complications and morbidity.  The differential diagnosis includes shunt malfunction   Co morbidities that complicate the patient evaluation  ESRD on HD (Tu, Th, Sat) with multiple failed fistulas and shunt revisions, chronic anemia, chronic back pain, htn, and hyperparathyroidism   Additional history obtained:  Additional history obtained from epic chart review  Lab Tests:  I Ordered, and personally interpreted labs.  The pertinent results include:  cbc with hgb 10.6 (stable) and bun 43 and cr 10.5.  k nl at 4.4   Consultations Obtained:  I requested consultation with the vascular surgeon (Dr. Charlotte Cookey),  and discussed lab and imaging findings as well as pertinent plan - he said they do not do these procedures now. Pt d/w Dr. Higinio Love (nephrology).  She arranged for a declot procedure to be done tomorrow.    Problem List / ED Course:  Fistula problem   Reevaluation:  After the interventions noted above, I reevaluated the patient and found that they have :improved   Social Determinants of Health:  Lives at home   Dispostion:  After consideration of the diagnostic results and the patients response to treatment, I feel that the patent would benefit from discharge with outpatient f/u.          Final Clinical Impression(s) / ED Diagnoses Final diagnoses:  ESRD on dialysis (HCC)  AV fistula thrombosis, initial encounter [facility])    Rx / DC Orders ED Discharge Orders     None         Sue Emery, MD 01/26/24 1445

## 2024-01-26 NOTE — ED Notes (Signed)
 Patient declined d/c vitals.

## 2024-01-26 NOTE — ED Triage Notes (Signed)
 Pt states her graft clotted, last dialysis tx was Saturday and had skip dialysis on Tuesday. [email or web address]

## 2024-01-26 NOTE — Discharge Instructions (Addendum)
 You need to go Admitting by 10:30 tomorrow to have a declotting procedure done in the [facility] Cath Lab by Dr. Austine Blunt.  You need to be NPO after midnight (nothing to eat or to drink) and have a driver.

## 2024-01-27 ENCOUNTER — Other Ambulatory Visit: Payer: Self-pay

## 2024-01-27 ENCOUNTER — Ambulatory Visit (HOSPITAL_COMMUNITY)
Admission: RE | Admit: 2024-01-27 | Discharge: 2024-01-27 | Disposition: A | Discharge status: Home / Self Care | Attending: Nephrology | Admitting: Nephrology

## 2024-01-27 ENCOUNTER — Encounter (HOSPITAL_COMMUNITY)
Admission: RE | Disposition: A | Discharge status: Home / Self Care | Payer: Self-pay | Source: Home / Self Care | Attending: Nephrology

## 2024-01-27 DIAGNOSIS — Z992 Dependence on renal dialysis: Secondary | ICD-10-CM | POA: Diagnosis not present

## 2024-01-27 DIAGNOSIS — Z79899 Other long term (current) drug therapy: Secondary | ICD-10-CM | POA: Insufficient documentation

## 2024-01-27 DIAGNOSIS — T82858A Stenosis of vascular prosthetic devices, implants and grafts, initial encounter: Secondary | ICD-10-CM | POA: Diagnosis not present

## 2024-01-27 DIAGNOSIS — N186 End stage renal disease: Secondary | ICD-10-CM | POA: Diagnosis not present

## 2024-01-27 DIAGNOSIS — Y832 Surgical operation with anastomosis, bypass or graft as the cause of abnormal reaction of the patient, or of later complication, without mention of misadventure at the time of the procedure: Secondary | ICD-10-CM | POA: Insufficient documentation

## 2024-01-27 DIAGNOSIS — T82868A Thrombosis of vascular prosthetic devices, implants and grafts, initial encounter: Secondary | ICD-10-CM | POA: Insufficient documentation

## 2024-01-27 DIAGNOSIS — Z87891 Personal history of nicotine dependence: Secondary | ICD-10-CM | POA: Insufficient documentation

## 2024-01-27 DIAGNOSIS — D631 Anemia in chronic kidney disease: Secondary | ICD-10-CM | POA: Diagnosis not present

## 2024-01-27 DIAGNOSIS — I120 Hypertensive chronic kidney disease with stage 5 chronic kidney disease or end stage renal disease: Secondary | ICD-10-CM | POA: Diagnosis not present

## 2024-01-27 DIAGNOSIS — N250 Renal osteodystrophy: Secondary | ICD-10-CM | POA: Insufficient documentation

## 2024-01-27 DIAGNOSIS — G8929 Other chronic pain: Secondary | ICD-10-CM | POA: Diagnosis not present

## 2024-01-27 HISTORY — PX: PERIPHERAL VASCULAR THROMBECTOMY: CATH118306

## 2024-01-27 HISTORY — PX: A/V SHUNT INTERVENTION: CATH118220

## 2024-01-27 SURGERY — A/V SHUNT INTERVENTION
Anesthesia: LOCAL

## 2024-01-27 MED ORDER — MIDAZOLAM HCL 2 MG/2ML IJ SOLN
INTRAMUSCULAR | Status: DC | PRN
  Administered 2024-01-27: 1 mg via INTRAVENOUS

## 2024-01-27 MED ORDER — FENTANYL CITRATE (PF) 100 MCG/2ML IJ SOLN
INTRAMUSCULAR | Status: AC
  Filled 2024-01-27: qty 2

## 2024-01-27 MED ORDER — HEPARIN SODIUM (PORCINE) 1000 UNIT/ML IJ SOLN
INTRAMUSCULAR | Status: AC
  Filled 2024-01-27: qty 10

## 2024-01-27 MED ORDER — MIDAZOLAM HCL 2 MG/2ML IJ SOLN
INTRAMUSCULAR | Status: AC
  Filled 2024-01-27: qty 2

## 2024-01-27 MED ORDER — FENTANYL CITRATE (PF) 100 MCG/2ML IJ SOLN
INTRAMUSCULAR | Status: DC | PRN
  Administered 2024-01-27: 25 ug via INTRAVENOUS

## 2024-01-27 MED ORDER — HEPARIN SODIUM (PORCINE) 1000 UNIT/ML IJ SOLN
INTRAMUSCULAR | Status: DC | PRN
  Administered 2024-01-27: 5000 [IU] via INTRAVENOUS

## 2024-01-27 MED ORDER — HEPARIN (PORCINE) IN NACL 2000-0.9 UNIT/L-% IV SOLN
INTRAVENOUS | Status: DC | PRN
  Administered 2024-01-27: 1000 mL

## 2024-01-27 MED ORDER — LIDOCAINE HCL (PF) 1 % IJ SOLN
INTRAMUSCULAR | Status: AC
  Filled 2024-01-27: qty 30

## 2024-01-27 MED ORDER — IODIXANOL 320 MG/ML IV SOLN
INTRAVENOUS | Status: DC | PRN
  Administered 2024-01-27: 8 mL

## 2024-01-27 MED ORDER — LIDOCAINE HCL (PF) 1 % IJ SOLN
INTRAMUSCULAR | Status: DC | PRN
  Administered 2024-01-27: 20 mL via INTRADERMAL

## 2024-01-27 SURGICAL SUPPLY — 9 items
BAG SNAP BAND KOVER 36X36 (MISCELLANEOUS) ×3 IMPLANT
BALLOON FOGARTY 5FR 40 (CATHETERS) IMPLANT
BALLOON MUSTANG 8X80X75 (BALLOONS) IMPLANT
CATH STR 7FR 55 BRITE (CATHETERS) IMPLANT
COVER DOME SNAP 22 D (MISCELLANEOUS) ×3 IMPLANT
GUIDEWIRE ANGLED .035X150CM (WIRE) IMPLANT
SHEATH PINNACLE R/O II 5F 6CM (SHEATH) IMPLANT
SHEATH PINNACLE R/O II 7F 4CM (SHEATH) IMPLANT
TRAY PV CATH (CUSTOM PROCEDURE TRAY) ×3 IMPLANT

## 2024-01-27 NOTE — H&P (Addendum)
 Chief Complaint: Thrombosed graft   Interval H&P   The patient has presented today for an angiogram/ angioplasty.   Various methods of treatment have been discussed with the patient.  After consideration of risk, benefits and other options for treatment, the patient has consented to a angiogram/ angioplasty with  possible stent placement.    Risks of angiogram with potential angioplasty and stenting if needed.contrast reaction, extravasation/ bleeding, dissection, hypotension and death were explained to the patient.   The patient's history has been reviewed and the patient has been examined, no changes in status.  Stable for angiogram/angioplasty   I have reviewed the patient's chart and labs.  Questions were answered to the patient's satisfaction.   Dialysis Orders:  [facility]  3h  450/1.5  83.5kg  2/2 bath  AVG   Hep 5000 - Hectoral 4mcg IV q HD - No ESA, Hgb 12.2 on 8/19     Assessment/Plan: [facility]D [facility] regimen with last dialysis Saturday Thrombosed right femoral AVG -  planning on declot, angiogram with angioplasty.  She last had a thrombectomy on 12/22/2022 then had outflow VA and venous limb 8 mm angioplasty on September 03, 2023. Renal osteodystrophy - continue binders per home regimen. Anemia - managed with ESA's and IV iron at dialysis center. HTN - resume home regimen.   HPI: Sue Neal is an 78 y.o. female with a history of hypertension, chronic back pain, hypertension, ESRD dialyzing at [facility] Tuesday Thursdays and Saturdays.  Patient has had an issue with prolonged bleeding; last outflow 8 mm angioplasty was on September 03, 2023.  She is here for a thrombosed AV graft dialysis treatment Saturday.  ROS Per HPI.  Chemistry and CBC: Creatinine, Ser  Date/Time Value Ref Range Status  01/26/2024 01:00 PM 10.50 (H) 0.44 - 1.00 mg/dL Final  47/42/5956 38:75 AM 9.20 (H) 0.44 - 1.00 mg/dL Final  64/33/2951 88:41 AM 7.50 (H) 0.44 - 1.00 mg/dL Final   66/02/3015 01:09 AM 7.00 (H) 0.44 - 1.00 mg/dL Final  32/35/5732 20:25 AM 9.09 (H) 0.44 - 1.00 mg/dL Final  42/70/6237 62:83 PM 8.55 (H) 0.44 - 1.00 mg/dL Final  15/17/6160 73:71 AM 7.94 (H) 0.44 - 1.00 mg/dL Final  03/16/9484 46:27 AM 7.80 (H) 0.44 - 1.00 mg/dL Final  03/50/0938 18:29 AM 8.36 (H) 0.44 - 1.00 mg/dL Final  93/71/6967 89:38 AM 8.80 (H) 0.44 - 1.00 mg/dL Final  07/07/5101 58:52 PM 7.26 (H) 0.44 - 1.00 mg/dL Final  77/82/4235 36:14 AM 5.70 (H) 0.44 - 1.00 mg/dL Final  43/15/4008 67:61 AM 6.10 (H) 0.50 - 1.10 mg/dL Final  95/05/3266 12:45 AM 7.10 (H) 0.50 - 1.10 mg/dL Final  80/99/8338 25:05 AM 6.00 (H) 0.50 - 1.10 mg/dL Final   Recent Labs  Lab 01/26/24 1300  NA 140  K 4.4  CL 102  CO2 25  GLUCOSE 70  BUN 43*  CREATININE 10.50*  CALCIUM 9.4   Recent Labs  Lab 01/26/24 1300  WBC 4.7  NEUTROABS 2.9  HGB 10.6*  HCT 34.5*  MCV 93.0  PLT 101*   Liver Function Tests: No results for input(s): "AST", "ALT", "ALKPHOS", "BILITOT", "PROT", "ALBUMIN " in the last 168 hours. No results for input(s): "LIPASE", "AMYLASE" in the last 168 hours. No results for input(s): "AMMONIA" in the last 168 hours. Cardiac Enzymes: No results for input(s): "CKTOTAL", "CKMB", "CKMBINDEX", "TROPONINI" in the last 168 hours. Iron Studies: No results for input(s): "IRON", "TIBC", "TRANSFERRIN", "FERRITIN" in the last 72 hours. PT/INR: @  LABRCNTIP(inr:5)  Xrays/Other Studies: ) Results for orders placed or performed during the hospital encounter of 01/26/24 (from the past 48 hours)  Basic metabolic panel     Status: Abnormal   Collection Time: 01/26/24  1:00 PM  Result Value Ref Range   Sodium 140 135 - 145 mmol/L   Potassium 4.4 3.5 - 5.1 mmol/L   Chloride 102 98 - 111 mmol/L   CO2 25 22 - 32 mmol/L   Glucose, Bld 70 70 - 99 mg/dL    Comment: Glucose reference range applies only to samples taken after fasting for at least 8 hours.   BUN 43 (H) 8 - 23 mg/dL   Creatinine, Ser 45.40  (H) 0.44 - 1.00 mg/dL   Calcium 9.4 8.9 - 98.1 mg/dL   GFR, Estimated 3 (L) >60 mL/min    Comment: (NOTE) Calculated using the CKD-EPI Creatinine Equation (2021)    Anion gap 13 5 - 15    Comment: Performed at [facility], [address], [place], [place] [address]  CBC with Differential     Status: Abnormal   Collection Time: 01/26/24  1:00 PM  Result Value Ref Range   WBC 4.7 4.0 - 10.5 K/uL   RBC 3.71 (L) 3.87 - 5.11 MIL/uL   Hemoglobin 10.6 (L) 12.0 - 15.0 g/dL   HCT 82.9 (L) 56.2 - 13.0 %   MCV 93.0 80.0 - 100.0 fL   MCH 28.6 26.0 - 34.0 pg   MCHC 30.7 30.0 - 36.0 g/dL   RDW 86.5 (H) 78.4 - 69.6 %   Platelets 101 (L) 150 - 400 K/uL   nRBC 0.0 0.0 - 0.2 %   Neutrophils Relative % 61 %   Neutro Abs 2.9 1.7 - 7.7 K/uL   Lymphocytes Relative 29 %   Lymphs Abs 1.4 0.7 - 4.0 K/uL   Monocytes Relative 7 %   Monocytes Absolute 0.3 0.1 - 1.0 K/uL   Eosinophils Relative 2 %   Eosinophils Absolute 0.1 0.0 - 0.5 K/uL   Basophils Relative 1 %   Basophils Absolute 0.0 0.0 - 0.1 K/uL   Immature Granulocytes 0 %   Abs Immature Granulocytes 0.00 0.00 - 0.07 K/uL    Comment: Performed at [facility], [address] [place], [place] [address]   No results found.  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)    lt thigh-working-old rt and lt upper arm shunts   Uterine fibroid     PSH:   Past Surgical History:  Procedure Laterality Date   A/V FISTULAGRAM N/A 09/03/2023   Procedure: A/V Fistulagram;   Surgeon: Patrick Boor, MD;  Location: [facility];  Service: Cardiovascular;  Laterality: N/A;   ARTERIOVENOUS GRAFT PLACEMENT Right 03/19/2000   upper arm   ARTERIOVENOUS GRAFT PLACEMENT Left 12/03/2000   thigh   AV FISTULA PLACEMENT Left 05/11/2020   Procedure:  Repair of ulceration left thigh arteriovenous graft;  Surgeon: Dannis Dy, MD;  Location: [facility] OR;  Service: Vascular;  Laterality: Left;   AV FISTULA PLACEMENT Right 06/11/2020   Procedure: INSERTION OF ARTERIOVENOUS (AV) GORE-TEX GRAFT  RIGHT THIGH;  Surgeon: Dannis Dy, MD;  Location: [facility] OR;  Service: Vascular;  Laterality: Right;   AVGG REMOVAL Left 12/09/2020   Procedure: LEFT THIGH ARTERIOVENOUS GORETEX GRAFT (AVGG) REMOVAL;  Surgeon: Young Hensen, MD;  Location: [facility] OR;  Service: Vascular;  Laterality: Left;   BIOPSY  09/28/2019   Procedure: BIOPSY;  Surgeon: Janel Medford, MD;  Location: [facility];  Service: Endoscopy;;   CARPAL TUNNEL RELEASE Left 03/10/2013   Procedure: CARPAL TUNNEL RELEASE;  Surgeon: Kemp Patter, MD;  Location: [facility];  Service: Orthopedics;  Laterality: Left;   CARPAL TUNNEL RELEASE Right 03/28/2015   Procedure: RIGHT CARPAL TUNNEL RELEASE;  Surgeon: Lyanne Sample, MD;  Location: [facility];  Service: Orthopedics;  Laterality: Right;  ANESTHESIA:  IV REGIONAL FAB   COLONOSCOPY W/ BIOPSIES     COLONOSCOPY WITH PROPOFOL  N/A 05/14/2016   Procedure: COLONOSCOPY WITH PROPOFOL ;  Surgeon: Janel Medford, MD;  Location: [facility];  Service: Endoscopy;  Laterality: N/A;   COLONOSCOPY WITH PROPOFOL  N/A 09/28/2019   Procedure: COLONOSCOPY WITH PROPOFOL ;  Surgeon: Janel Medford, MD;  Location: [facility];  Service: Endoscopy;  Laterality: N/A;  HARD STICK-DIALYSIS PATIENT   HEMICOLECTOMY  02/1996   HERNIA REPAIR     laparoscopic repair during a [place] hospitalization from 03/26/2006-03/30/2006   INCISIONAL HERNIA REPAIR  04/15/2006    laparoscopic   INSERTION OF DIALYSIS CATHETER Left 06/06/2000   IJ Quinton catheter   INSERTION OF DIALYSIS CATHETER Right 06/28/2000   IJ Ash catheter   INSERTION OF DIALYSIS CATHETER Left 08/13/2000   subclavian Ash catheter   INSERTION OF DIALYSIS CATHETER Left 05/11/2020   Procedure: Ultrasound-guided access to the right internal jugular vein with venogram right IJ and azygous vein 2.  Placement of left femoral 55 cm tunneled dialysis catheter under ultrasound guidance ;  Surgeon: Dannis Dy, MD;  Location: [facility] OR;  Service: Vascular;  Laterality: Left;   multiple failed grafts     left thigh AVG 12/03/00, clotted -05/31/03, 01/24/04, 08/28/04, 09/06/04( thrombectomy and revision )Left AVG declot procedure including complete AV shuntogram, 08/29/04 left AV thrombolysis and angioplasty 2012 shunto gram to left thigh AVG   PERIPHERAL VASCULAR BALLOON ANGIOPLASTY  09/03/2023   Procedure: PERIPHERAL VASCULAR BALLOON ANGIOPLASTY;  Surgeon: Patrick Boor, MD;  [facility]: [facility];  Service: Cardiovascular;;   POLYPECTOMY  09/28/2019   Procedure: POLYPECTOMY;  Surgeon: Janel Medford, MD;  Location: [facility];  Service: Endoscopy;;   REMOVAL OF A DIALYSIS CATHETER  05/31/2000   Schon catheter   REVISION OF ARTERIOVENOUS GORETEX GRAFT Left 06/23/2012   thigh; with exc. pseudoaneurysm   REVISION OF ARTERIOVENOUS GORETEX GRAFT Left 02/07/2016   Procedure: REVISION OF LEFT THIGH ARTERIOVENOUS GORETEX GRAFT;  Surgeon: Dannis Dy, MD;  Location: [facility] OR;  Service: Vascular;  Laterality: Left;   SHUNTOGRAM Left 02/16/2012   thigh; with angioplasty, venous anastomosis; stent, medial graft pseudoaneurysm   SHUNTOGRAM N/A 02/16/2012   Procedure: Priscilla Brothers;  Surgeon: Margherita Shell, MD;  Location: [facility] CATH LAB;  Service: Cardiovascular;  Laterality: N/A;   THROMBECTOMY / ARTERIOVENOUS GRAFT REVISION Left 02/07/2016   thigh   THROMBECTOMY AND REVISION OF ARTERIOVENTOUS (AV) GORETEX  GRAFT  Right 06/04/2000   upper arm   THROMBECTOMY AND REVISION OF ARTERIOVENTOUS (AV) GORETEX  GRAFT Left 09/06/2004   thigh   TRIGGER FINGER RELEASE Right 03/28/2015   Procedure: RELEASE A-1 PULLEY RIGHT THUMB;  Surgeon: Lyanne Sample, MD;  Location: [facility];  Service: Orthopedics;  Laterality: Right;    Allergies: No Known Allergies  Medications:   Prior to Admission medications   Medication Sig Start Date End Date Taking? Authorizing Provider  acetaminophen  (TYLENOL ) 500 MG tablet Take 500-1,000 mg by mouth every 6 (six) hours as needed for moderate pain.    [provider]  cinacalcet  (SENSIPAR ) 90 MG tablet Take 90 mg by mouth daily.    [provider]  ketotifen  (ZADITOR ) 0.035 % ophthalmic solution Place 1 drop into both eyes 2 (two) times daily as needed (allergies).    [provider]  sevelamer  (RENVELA ) 800 MG tablet Take 800-2,400 mg by mouth See admin instructions. Take 2400 mg by mouth with meals and take 800 mg with snacks    [provider]    Discontinued Meds:  There are no discontinued medications.  Social History:  reports that she quit smoking about 46 years ago. Her smoking use included cigarettes. She has never used smokeless tobacco. She reports current alcohol use. She reports that she does not use drugs.  Family History:   Family History  Problem Relation Age of Onset   Colon cancer Mother 64   Diabetes Cousin    Breast cancer Maternal Aunt    Breast cancer Cousin     Last menstrual period 02/16/2012. Gen:nad, comfortable appearing, sitting up in chair CVS:reg rate Resp:cta bl, no w/r/r/c, unlabored, bl chest expansion WUJ:WJXBJ, soft, nt/nd YNW:GNFAOZHY left thigh, dressings in place, left thigh tdc in place, mild pretib edema bilat Neuro: speech clear and coherent, moves all extremities spontaneously ACCESS: Right femoral graft,  thrombosed       Sally-Ann Cutbirth, Alveda Aures, MD 01/27/2024, 10:01 AM

## 2024-01-27 NOTE — Op Note (Signed)
 Pt referred for a declot in her right thigh loop graft (standard configuration with the arterial limb lateral).  On examination there is no pulse or bruit in the left thigh loop graft.  Summary:  1) Successful thrombectomy of a right thigh loop AVG (arterial limb lateral in the standard configuration) with evidence of 70% venous anastomosis stenosis which was corrected with angioplasty (8x8 Mustang FE ~18ATM) and venous limb intragraft stenosis which was corrected with angioplasty (8x8 Mustang FE ~18 ATM). 2) Outflow iliac veins, centrals and inflow are patent. 3) This right  thigh loop graft remains amenable to future percutaneous intervention.  Description of procedure: The right  thigh was prepped and draped in the usual fashion. The right thigh loop AVG (arterial limb lateral) was first cannulated ( 34742) in the arterial limb in an antegrade direction and then a 7Fr sheath was inserted by guidewire exchange technique. A guidewire was passed through the clotted graft into the central veins under fluoroscopic guidance and then a 5Fr diagnostic catheter inserted over the wire. The wire was removed and then iv heparin  and sedative drugs administered. A pullback angiogram was performed by injecting contrast (V9563) via the diagnostic catheter . This showed a patent central and draining veins but a 70% stenosis in the venous anastomosis and 70% venous limb intragraft stenosis.   There was evidence of filling defects in the graft consistent with thrombus.  Three passes were made with the 7Fr [facility] 1 catheter from the common iliac vein to the antegrade sheath to physically remove thrombus and perform the thrombectomy (87564). The catheter was removed and an 71mmx8cm Mustang angioplasty balloon was inserted over the wire to the level of the outflow venous anastomosis and then venous limb intragraft stenosis followed by venous angioplasty (33295) carried out to 18ATM in the venous anastomotic stenosis to FULL  effacement and then more gently in the rest of the graft to macerate thrombus.   In order to remove this clot and remove the platelet plug at the arterial anastomosis, a second cannulation ( 18841) was required in a retrograde direction. A 7Fr sheath was inserted by guidewire exchange technique. Wire was advanced into the iliac artery; a 4Fr Fogarty catheter was inserted through the 7 Fr sheath and advanced into the femoral artery, inflated with 0.6cc of contrast and then pulled across the arterial anastomosis into the graft with aspiration of the sheath side port to remove the platelet plug and thrombus. [phone]) This step was repeated to sweep all residual clot from this side of the graft. In order to check the arterial inflow an arteriogram was necessary because refluxing contrast from the graft would have caused risk of embolizing thrombus from the graft into the arterial vasculature. A glidewire and straight catheter were inserted into the iliac artery and an arteriogram performed. An arteriogram (01601) of the extremity was performed. This documented a very calcified arterial system with patent iliac artery and slow distal run off to calcified arterial vessels without any evidence of emboli. The femoral artery, SFA patent to the TP trunk very small vessels beyond that but no evidence of emboli; the arterial anastomosis was widely patent and there was very good flow in the graft.  Of note there was a mild 30% focal arterial limb intragraft stenosis in the cannulation zone which we also treated with a 8 mm balloon.    I then polished the outflow with a partially inflated 8 mm x 8 cm Mustang angioplasty balloon to further eliminate any residual thrombus  in the outflow tract. Completion outflow angiogram revealed rapid flows, 10% residual stenosis at the venous anastomosis with no dissection or extravasation. No significant intragraft or VA stenosis noted. Completion angiogram showed no residual thrombus, no  residual stenosis and rapid flows through the graft circuit.  Hemostasis: A 3-0 ethilon purse string suture was placed at the cannulation site on removal of the sheath.  Sedation: 1 mg Versed , 25 mcg Fentanyl . Sedation time. 25  minutes  Contrast. 8 mL  Monitoring: Because of the patient's comorbid conditions and sedation during the procedure, continuous EKG monitoring and O2 saturation monitoring was performed throughout the procedure by the RN. There were no abnormal arrhythmias encountered.  Complications: None.   Diagnoses: N18.6 End stage renal disease  T82.858A Stenosis of vascular prosthetic devices, implants and grafts, initial encounter T82.868A Thrombosis of vascular prosthetic device/graft, initial  Procedure Coding:  [phone] Cannulation and angiogram of fistula/ graft, thrombectomy and venous angioplasty V7846 Contrast  Recommendations:  1. Continue to cannulate the graft with 15G needles.  2. Refer back for problems with flows. 3. Remove the sutures next treatment.   Discharge: The patient was discharged home in stable condition. The patient was given education regarding the care of the dialysis access AVF and specific instructions in case of any problems.

## 2024-01-27 NOTE — Discharge Instructions (Signed)

## 2024-01-28 ENCOUNTER — Encounter (HOSPITAL_COMMUNITY): Payer: Self-pay | Admitting: Nephrology

## 2024-07-19 ENCOUNTER — Encounter (HOSPITAL_COMMUNITY): Payer: Self-pay | Admitting: Vascular Surgery

## 2024-07-19 ENCOUNTER — Other Ambulatory Visit: Payer: Self-pay

## 2024-07-19 ENCOUNTER — Encounter (HOSPITAL_COMMUNITY)
Admission: RE | Disposition: A | Discharge status: Home / Self Care | Payer: Self-pay | Source: Home / Self Care | Attending: Vascular Surgery

## 2024-07-19 ENCOUNTER — Ambulatory Visit (HOSPITAL_COMMUNITY)
Admission: RE | Admit: 2024-07-19 | Discharge: 2024-07-19 | Disposition: A | Discharge status: Home / Self Care | Attending: Vascular Surgery | Admitting: Vascular Surgery

## 2024-07-19 DIAGNOSIS — N186 End stage renal disease: Secondary | ICD-10-CM | POA: Diagnosis not present

## 2024-07-19 DIAGNOSIS — I120 Hypertensive chronic kidney disease with stage 5 chronic kidney disease or end stage renal disease: Secondary | ICD-10-CM | POA: Diagnosis not present

## 2024-07-19 DIAGNOSIS — Y832 Surgical operation with anastomosis, bypass or graft as the cause of abnormal reaction of the patient, or of later complication, without mention of misadventure at the time of the procedure: Secondary | ICD-10-CM | POA: Insufficient documentation

## 2024-07-19 DIAGNOSIS — Z87891 Personal history of nicotine dependence: Secondary | ICD-10-CM | POA: Diagnosis not present

## 2024-07-19 DIAGNOSIS — Z992 Dependence on renal dialysis: Secondary | ICD-10-CM | POA: Insufficient documentation

## 2024-07-19 DIAGNOSIS — T82858A Stenosis of vascular prosthetic devices, implants and grafts, initial encounter: Secondary | ICD-10-CM | POA: Insufficient documentation

## 2024-07-19 HISTORY — PX: VENOUS ANGIOPLASTY: CATH118376

## 2024-07-19 HISTORY — PX: A/V FISTULAGRAM: CATH118298

## 2024-07-19 MED ORDER — HEPARIN (PORCINE) IN NACL 1000-0.9 UT/500ML-% IV SOLN
INTRAVENOUS | Status: DC | PRN
  Administered 2024-07-19: 500 mL

## 2024-07-19 MED ORDER — IODIXANOL 320 MG/ML IV SOLN
INTRAVENOUS | Status: DC | PRN
  Administered 2024-07-19: 35 mL via INTRAVENOUS

## 2024-07-19 MED ORDER — LIDOCAINE HCL (PF) 1 % IJ SOLN
INTRAMUSCULAR | Status: AC
  Filled 2024-07-19: qty 30

## 2024-07-19 MED ORDER — LIDOCAINE HCL (PF) 1 % IJ SOLN
INTRAMUSCULAR | Status: DC | PRN
  Administered 2024-07-19: 2 mL via SUBCUTANEOUS

## 2024-07-19 SURGICAL SUPPLY — 9 items
BALLOON MUSTANG 8X80X75 (BALLOONS) IMPLANT
DEVICE INFLATION ENCORE 26 (MISCELLANEOUS) IMPLANT
GUIDEWIRE ANGLED .035 180CM (WIRE) IMPLANT
SET MICROPUNCTURE 5F STIFF (MISCELLANEOUS) IMPLANT
SHEATH PINNACLE R/O II 6F 4CM (SHEATH) IMPLANT
SHEATH PROBE COVER 6X72 (BAG) IMPLANT
STOPCOCK MORSE 400PSI 3WAY (MISCELLANEOUS) IMPLANT
TRAY PV CATH (CUSTOM PROCEDURE TRAY) ×1 IMPLANT
TUBING CIL FLEX 10 FLL-RA (TUBING) IMPLANT

## 2024-07-19 NOTE — Op Note (Signed)
 DATE OF SERVICE: 07/19/2024  PATIENT:  Sue Neal  78 y.o. female  PRE-OPERATIVE DIAGNOSIS:  end-stage renal disease  POST-OPERATIVE DIAGNOSIS:  Same  PROCEDURE:   1) Ultrasound guided right thigh AVG access (CPT [phone]) 2) Right lower extremity fistulagram with peripheral angioplasty - 8x29mm Mustang (CPT [phone]) 3) established outpatient evaluation and management - level 3 (CPT 99213)  SURGEON:  Debby SAILOR. Magda, MD  ASSISTANT: none  ANESTHESIA:   local  ESTIMATED BLOOD LOSS: min  LOCAL MEDICATIONS USED:  LIDOCAINE    COUNTS: confirmed correct.  PATIENT DISPOSITION:  PACU - hemodynamically stable.   Delay start of Pharmacological VTE agent (>24hrs) due to surgical blood loss or risk of bleeding: no  INDICATION FOR PROCEDURE: Sue Neal is a 78 y.o. female with ESRD on HD via RLE AVG. The graft is pulsatile and is pulling clots. After careful discussion of risks, benefits, and alternatives the patient was offered fistulagram. The patient understood and wished to proceed.  OPERATIVE FINDINGS:  Right Lower Extremity Central venous: no stenosis Common femoral vein: 60% stenosis  Right thigh graft: no stenosis:  Arterial anastomosis: no stenosis  DESCRIPTION OF PROCEDURE: After identification of the patient in the pre-operative holding area, the patient was transferred to the operating room. The patient was positioned supine on the operating room table.  The right extremity was prepped and draped in standard fashion. A surgical pause was performed confirming correct patient, procedure, and operative location.  The right lower extremity was anesthetized with subcutaneous injection of 1% lidocaine  over the area of planned access. Using ultrasound guidance, the right thigh dialysis access was accessed with micropuncture technique.  Fistulogram was performed in stations with the micro sheath.  See above for details.  The decision was made to intervene. The lesion was  crossed with a glidewire. Access was upsized to 55F. The lesion was angioplastied with 8x69mm Mustang. Good result was noted with 20% residual stenosis.  All endovascular equipment was removed.  A figure-of-eight stitch was applied to the exit site with good hemostasis.  Sterile bandage was applied.  Upon completion of the case instrument and sharps counts were confirmed correct. The patient was transferred to the PACU in good condition. I was present for all portions of the procedure.  PLAN: OK to use graft now. Graft remains amenable to percutaneous intervention.   Debby SAILOR. Magda, MD [facility] Phone Number: [phone] 07/19/2024 1:01 PM

## 2024-07-19 NOTE — H&P (Signed)
 [facility] [place]  ASSESSMENT / PLAN: 78 y.o. female with ESRD on HD via R thigh graft. This has become pulsatile and is pulling clots at dialysis. Plan fistulagram today to evaluate and manage.  CHIEF COMPLAINT: ESRD  HISTORY OF PRESENT ILLNESS: Sue Neal is a 78 y.o. female with ESRD on HD. She dialyzes through a right thigh graft. The graft is not working well at dialysis. A fistulagram is requested by her dialysis center.   Past Medical History:  Diagnosis Date   Ambulates with cane    occasional uses cane   Anemia    Arthritis    Back pain, chronic    Blood transfusion without reported diagnosis    Cataract    Complication of anesthesia     I woke up during the procedure.   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    lt thigh-working-old rt and lt upper arm shunts   Uterine fibroid     Past Surgical History:  Procedure Laterality Date   A/V FISTULAGRAM N/A 09/03/2023   Procedure: A/V Fistulagram;  Surgeon: Melia Lynwood ORN, MD;  Location: [facility] INVAS[facility] CV [facility];  Service: Cardiovascular;  Laterality: N/A;   A/V SHUNT INTERVENTION N/A 01/27/2024   Procedure: A/V SHUNT INTERVENTION;  Surgeon: Melia Lynwood ORN, MD;  Location: [facility] INVASIVE CV LAB;  Service: Cardiovascular;  Laterality: N/A;  70% illiac and intragraft   ARTERIOVENOUS GRAFT PLACEMENT Right 03/19/2000   upper arm   ARTERIOVENOUS GRAFT PLACEMENT Left 12/03/2000   thigh   AV FISTULA PLACEMENT Left 05/11/2020   Procedure: Repair of ulceration left thigh arteriovenous graft;  Surgeon: Eliza Lonni RAMAN, MD;  Location: [facility] OR;  Service: Vascular;  Laterality: Left;   AV FISTULA PLACEMENT Right 06/11/2020   Procedure: INSERTION OF ARTERIOVENOUS (AV) GORE-TEX GRAFT  RIGHT THIGH;  Surgeon: Eliza Lonni RAMAN, MD;  Location: [facility] OR;  Service: Vascular;  Laterality: Right;   AVGG REMOVAL Left 12/09/2020   Procedure: LEFT THIGH ARTERIOVENOUS GORETEX GRAFT (AVGG) REMOVAL;  Surgeon: Gretta Lonni PARAS, MD;  Location: [facility] OR;  Service: Vascular;  Laterality: Left;   BIOPSY  09/28/2019   Procedure: BIOPSY;  Surgeon: Teressa Toribio SQUIBB, MD;  Location: [facility];  Service: Endoscopy;;   CARPAL TUNNEL RELEASE Left 03/10/2013   Procedure: CARPAL TUNNEL RELEASE;  Surgeon: Arley JONELLE Curia, MD;  Location: [facility];  Service: Orthopedics;  Laterality: Left;   CARPAL TUNNEL RELEASE Right 03/28/2015   Procedure: RIGHT CARPAL TUNNEL RELEASE;  Surgeon: Arley Curia, MD;  Location: [facility];  Service: Orthopedics;  Laterality: Right;  ANESTHESIA:  IV REGIONAL FAB   COLONOSCOPY W/ BIOPSIES     COLONOSCOPY WITH PROPOFOL  N/A 05/14/2016   Procedure: COLONOSCOPY WITH PROPOFOL ;  Surgeon: Toribio SQUIBB Teressa, MD;  Location: [facility];  Service: Endoscopy;  Laterality: N/A;   COLONOSCOPY WITH PROPOFOL  N/A 09/28/2019   Procedure: COLONOSCOPY WITH PROPOFOL ;  Surgeon: Teressa Toribio SQUIBB, MD;  Location: [facility];  Service: Endoscopy;  Laterality: N/A;  HARD STICK-DIALYSIS PATIENT   HEMICOLECTOMY  02/1996   HERNIA REPAIR     laparoscopic repair during a [place] hospitalization from 03/26/2006-03/30/2006   INCISIONAL HERNIA REPAIR  04/15/2006   laparoscopic   INSERTION OF DIALYSIS CATHETER Left 06/06/2000  IJ Quinton catheter   INSERTION OF DIALYSIS CATHETER Right 06/28/2000   IJ Ash catheter   INSERTION OF DIALYSIS CATHETER Left 08/13/2000   subclavian Ash catheter   INSERTION OF DIALYSIS CATHETER Left 05/11/2020   Procedure: Ultrasound-guided access to the right internal jugular vein with venogram right IJ and azygous vein 2.  Placement of left femoral 55 cm tunneled dialysis catheter under ultrasound guidance ;  Surgeon: Eliza Lonni RAMAN, MD;  Location: [facility] OR;  Service: Vascular;   Laterality: Left;   multiple failed grafts     left thigh AVG 12/03/00, clotted -05/31/03, 01/24/04, 08/28/04, 09/06/04( thrombectomy and revision )Left AVG declot procedure including complete AV shuntogram, 08/29/04 left AV thrombolysis and angioplasty 2012 shunto gram to left thigh AVG   PERIPHERAL VASCULAR BALLOON ANGIOPLASTY  09/03/2023   Procedure: PERIPHERAL VASCULAR BALLOON ANGIOPLASTY;  Surgeon: Melia Lynwood ORN, MD;  Location: [facility]VASIVE CV LAB;  Service: Cardiovascular;;   PERIPHERAL VASCULAR THROMBECTOMY  01/27/2024   Procedure: PERIPHERAL VASCULAR THROMBECTOMY;  Surgeon: Melia Lynwood ORN, MD;  Location: [facility] INVASIVE CV LAB;  Service: Cardiovascular;;   POLYPECTOMY  09/28/2019   Procedure: POLYPECTOMY;  Surgeon: Teressa Toribio SQUIBB, MD;  Location: [facility];  Service: Endoscopy;;   REMOVAL OF A DIALYSIS CATHETER  05/31/2000   Schon catheter   REVISION OF ARTERIOVENOUS GORETEX GRAFT Left 06/23/2012   thigh; with exc. pseudoaneurysm   REVISION OF ARTERIOVENOUS GORETEX GRAFT Left 02/07/2016   Procedure: REVISION OF LEFT THIGH ARTERIOVENOUS GORETEX GRAFT;  Surgeon: Lonni RAMAN Eliza, MD;  Location: [facility] OR;  Service: Vascular;  Laterality: Left;   SHUNTOGRAM Left 02/16/2012   thigh; with angioplasty, venous anastomosis; stent, medial graft pseudoaneurysm   SHUNTOGRAM N/A 02/16/2012   Procedure: RICH;  Surgeon: Gaile ORN New, MD;  Location: [facility] CATH LAB;  Service: Cardiovascular;  Laterality: N/A;   THROMBECTOMY / ARTERIOVENOUS GRAFT REVISION Left 02/07/2016   thigh   THROMBECTOMY AND REVISION OF ARTERIOVENTOUS (AV) GORETEX  GRAFT Right 06/04/2000   upper arm   THROMBECTOMY AND REVISION OF ARTERIOVENTOUS (AV) GORETEX  GRAFT Left 09/06/2004   thigh   TRIGGER FINGER RELEASE Right 03/28/2015   Procedure: RELEASE A-1 PULLEY RIGHT THUMB;  Surgeon: Arley Curia, MD;  Location: [facility];  Service: Orthopedics;  Laterality: Right;    Family History  Problem Relation Age of Onset   Colon  cancer Mother 75   Diabetes Cousin    Breast cancer Maternal Aunt    Breast cancer Cousin     Social History   Socioeconomic History   Marital status: Single    Spouse name: Not on file   Number of children: 0   Years of education: 13   Highest education level: Not on file  Occupational History   Occupation: Retired   Tobacco Use   Smoking status: Former    Current packs/day: 0.00    Types: Cigarettes    Quit date: 02/03/1978    Years since quitting: 46.4   Smokeless tobacco: Never  Vaping Use   Vaping status: Never Used  Substance and Sexual Activity   Alcohol use: Yes    Alcohol/week: 0.0 standard drinks of alcohol    Comment: rare-once a year   Drug use: No   Sexual activity: Not on file  Other Topics Concern   Not on file  Social History Narrative   Fun: Bowel, read, try new restaurants.    Denies abuse and feels safe at home.    Social Drivers of Health  Financial Resource Strain: Not on file  Food Insecurity: Not on file  Transportation Needs: Not on file  Physical Activity: Not on file  Stress: Not on file  Social Connections: Not on file  Intimate Partner Violence: Not on file    No Known Allergies  No current facility-administered medications for this encounter.    PHYSICAL EXAM Vitals:   07/19/24 1154  BP: (!) 120/53  Pulse: 78  Resp: 12  Temp: 97.6 F (36.4 C)  TempSrc: Oral  SpO2: 94%  Weight: 74.4 kg  Height: 5' 1 (1.549 m)   No distress Regular rate and rhythm Unlabored breathing Pulsatile R thigh graft  PERTINENT LABORATORY AND RADIOLOGIC DATA  Most recent CBC    Latest Ref Rng & Units 01/26/2024    1:00 PM 09/03/2023    9:08 AM 12/09/2020   10:21 AM  CBC  WBC 4.0 - 10.5 K/uL 4.7     Hemoglobin 12.0 - 15.0 g/dL 89.3  87.7  86.3   Hematocrit 36.0 - 46.0 % 34.5  36.0  40.0   Platelets 150 - 400 K/uL 101        Most recent CMP    Latest Ref Rng & Units 01/26/2024    1:00 PM 09/03/2023    9:08 AM 12/09/2020   10:21 AM   CMP  Glucose 70 - 99 mg/dL 70  86  79   BUN 8 - 23 mg/dL 43  33  32   Creatinine 0.44 - 1.00 mg/dL 89.49  0.79  2.49   Sodium 135 - 145 mmol/L 140  140  141   Potassium 3.5 - 5.1 mmol/L 4.4  4.8  4.9   Chloride 98 - 111 mmol/L 102  99  99   CO2 22 - 32 mmol/L 25     Calcium 8.9 - 10.3 mg/dL 9.4      Sue Randleman N. Magda, MD [facility] Phone Number: [phone] 07/19/2024 12:14 PM   Total time spent on preparing this encounter including chart review, data review, collecting history, examining the patient, and coordinating care: 30 min  Portions of this report may have been transcribed using voice recognition software.  Every effort has been made to ensure accuracy; however, inadvertent computerized transcription errors may still be present.
